# Patient Record
Sex: Female | Born: 1935
Health system: Southern US, Community
[De-identification: ages and names within clinical notes are randomized; demographics above are authoritative.]

## PROBLEM LIST (undated history)

## (undated) DIAGNOSIS — N816 Rectocele: Secondary | ICD-10-CM

## (undated) DIAGNOSIS — K219 Gastro-esophageal reflux disease without esophagitis: Secondary | ICD-10-CM

## (undated) DIAGNOSIS — Z9289 Personal history of other medical treatment: Secondary | ICD-10-CM

## (undated) DIAGNOSIS — M1990 Unspecified osteoarthritis, unspecified site: Secondary | ICD-10-CM

## (undated) DIAGNOSIS — Z8669 Personal history of other diseases of the nervous system and sense organs: Secondary | ICD-10-CM

## (undated) DIAGNOSIS — M2550 Pain in unspecified joint: Secondary | ICD-10-CM

## (undated) DIAGNOSIS — D509 Iron deficiency anemia, unspecified: Secondary | ICD-10-CM

## (undated) DIAGNOSIS — Z9889 Other specified postprocedural states: Secondary | ICD-10-CM

## (undated) DIAGNOSIS — R011 Cardiac murmur, unspecified: Secondary | ICD-10-CM

## (undated) DIAGNOSIS — R079 Chest pain, unspecified: Secondary | ICD-10-CM

## (undated) DIAGNOSIS — R112 Nausea with vomiting, unspecified: Secondary | ICD-10-CM

## (undated) HISTORY — PX: TONSILLECTOMY: SUR1361

## (undated) HISTORY — DX: Chest pain, unspecified: R07.9

## (undated) HISTORY — PX: JOINT REPLACEMENT: SHX530

## (undated) HISTORY — PX: SHOULDER ARTHROSCOPY: SHX128

## (undated) HISTORY — DX: Personal history of other diseases of the nervous system and sense organs: Z86.69

## (undated) HISTORY — DX: Rectocele: N81.6

## (undated) HISTORY — PX: THYROIDECTOMY: SHX17

## (undated) HISTORY — PX: OTHER SURGICAL HISTORY: SHX169

## (undated) HISTORY — PX: COLONOSCOPY: SHX174

## (undated) HISTORY — DX: Iron deficiency anemia, unspecified: D50.9

---

## 1998-04-13 ENCOUNTER — Other Ambulatory Visit: Admission: RE | Admit: 1998-04-13 | Discharge: 1998-04-13 | Payer: Self-pay | Admitting: Obstetrics and Gynecology

## 1999-04-17 ENCOUNTER — Other Ambulatory Visit: Admission: RE | Admit: 1999-04-17 | Discharge: 1999-04-17 | Payer: Self-pay | Admitting: Obstetrics and Gynecology

## 1999-08-29 ENCOUNTER — Encounter: Payer: Self-pay | Admitting: Obstetrics and Gynecology

## 1999-08-29 ENCOUNTER — Encounter: Admission: RE | Admit: 1999-08-29 | Discharge: 1999-08-29 | Payer: Self-pay | Admitting: Obstetrics and Gynecology

## 1999-11-20 HISTORY — PX: HYSTEROSCOPY: SHX211

## 1999-11-23 ENCOUNTER — Encounter: Payer: Self-pay | Admitting: Obstetrics and Gynecology

## 1999-11-23 ENCOUNTER — Encounter (INDEPENDENT_AMBULATORY_CARE_PROVIDER_SITE_OTHER): Payer: Self-pay | Admitting: Specialist

## 1999-11-23 ENCOUNTER — Ambulatory Visit (HOSPITAL_COMMUNITY): Admission: RE | Admit: 1999-11-23 | Discharge: 1999-11-23 | Payer: Self-pay | Admitting: Obstetrics and Gynecology

## 2001-04-01 ENCOUNTER — Encounter: Payer: Self-pay | Admitting: Ophthalmology

## 2001-04-04 ENCOUNTER — Ambulatory Visit (HOSPITAL_COMMUNITY): Admission: RE | Admit: 2001-04-04 | Discharge: 2001-04-04 | Payer: Self-pay | Admitting: Ophthalmology

## 2001-05-15 ENCOUNTER — Other Ambulatory Visit: Admission: RE | Admit: 2001-05-15 | Discharge: 2001-05-15 | Payer: Self-pay | Admitting: Obstetrics and Gynecology

## 2001-05-20 ENCOUNTER — Ambulatory Visit (HOSPITAL_COMMUNITY): Admission: RE | Admit: 2001-05-20 | Discharge: 2001-05-20 | Payer: Self-pay | Admitting: Ophthalmology

## 2001-05-25 ENCOUNTER — Emergency Department (HOSPITAL_COMMUNITY): Admission: EM | Admit: 2001-05-25 | Discharge: 2001-05-25 | Payer: Self-pay | Admitting: Emergency Medicine

## 2002-03-23 ENCOUNTER — Encounter: Payer: Self-pay | Admitting: Geriatric Medicine

## 2002-03-23 ENCOUNTER — Encounter: Admission: RE | Admit: 2002-03-23 | Discharge: 2002-03-23 | Payer: Self-pay | Admitting: Geriatric Medicine

## 2002-11-05 ENCOUNTER — Encounter: Payer: Self-pay | Admitting: Orthopedic Surgery

## 2002-11-09 ENCOUNTER — Inpatient Hospital Stay (HOSPITAL_COMMUNITY): Admission: RE | Admit: 2002-11-09 | Discharge: 2002-11-13 | Payer: Self-pay | Admitting: Orthopedic Surgery

## 2003-01-29 ENCOUNTER — Encounter: Admission: RE | Admit: 2003-01-29 | Discharge: 2003-01-29 | Payer: Self-pay | Admitting: Geriatric Medicine

## 2003-06-08 ENCOUNTER — Other Ambulatory Visit: Admission: RE | Admit: 2003-06-08 | Discharge: 2003-06-08 | Payer: Self-pay | Admitting: Obstetrics and Gynecology

## 2003-08-17 ENCOUNTER — Ambulatory Visit (HOSPITAL_COMMUNITY): Admission: RE | Admit: 2003-08-17 | Discharge: 2003-08-17 | Payer: Self-pay | Admitting: Internal Medicine

## 2003-09-27 ENCOUNTER — Inpatient Hospital Stay (HOSPITAL_COMMUNITY): Admission: RE | Admit: 2003-09-27 | Discharge: 2003-10-01 | Payer: Self-pay | Admitting: Orthopedic Surgery

## 2004-08-15 ENCOUNTER — Encounter: Admission: RE | Admit: 2004-08-15 | Discharge: 2004-08-15 | Payer: Self-pay | Admitting: Geriatric Medicine

## 2005-06-14 ENCOUNTER — Encounter: Admission: RE | Admit: 2005-06-14 | Discharge: 2005-06-14 | Payer: Self-pay | Admitting: Geriatric Medicine

## 2007-05-05 ENCOUNTER — Ambulatory Visit: Payer: Self-pay | Admitting: Internal Medicine

## 2007-05-05 LAB — CONVERTED CEMR LAB
Basophils Absolute: 0.1 10*3/uL (ref 0.0–0.1)
Eosinophils Absolute: 0.1 10*3/uL (ref 0.0–0.6)
Eosinophils Relative: 2.5 % (ref 0.0–5.0)
MCV: 94.1 fL (ref 78.0–100.0)
Monocytes Absolute: 0.7 10*3/uL (ref 0.2–0.7)
RBC: 3.47 M/uL — ABNORMAL LOW (ref 3.87–5.11)
Saturation Ratios: 30.7 % (ref 20.0–50.0)
Vitamin B-12: 782 pg/mL (ref 211–911)
WBC: 5.9 10*3/uL (ref 4.5–10.5)

## 2007-05-21 ENCOUNTER — Encounter: Payer: Self-pay | Admitting: Internal Medicine

## 2007-05-21 ENCOUNTER — Ambulatory Visit: Payer: Self-pay | Admitting: Internal Medicine

## 2007-05-21 ENCOUNTER — Ambulatory Visit (HOSPITAL_COMMUNITY): Admission: RE | Admit: 2007-05-21 | Discharge: 2007-05-21 | Payer: Self-pay | Admitting: Internal Medicine

## 2007-05-29 ENCOUNTER — Ambulatory Visit: Payer: Self-pay | Admitting: Internal Medicine

## 2007-06-09 ENCOUNTER — Ambulatory Visit: Payer: Self-pay | Admitting: Internal Medicine

## 2007-06-09 LAB — CONVERTED CEMR LAB
OCCULT 1: NEGATIVE
OCCULT 1: NEGATIVE
OCCULT 2: NEGATIVE
OCCULT 4: NEGATIVE
OCCULT 5: NEGATIVE

## 2007-07-09 ENCOUNTER — Ambulatory Visit: Payer: Self-pay | Admitting: Internal Medicine

## 2007-07-10 ENCOUNTER — Encounter: Payer: Self-pay | Admitting: Internal Medicine

## 2007-07-10 LAB — CONVERTED CEMR LAB
Basophils Absolute: 0.1 10*3/uL (ref 0.0–0.1)
Basophils Relative: 1 % (ref 0.0–1.0)
Eosinophils Absolute: 0.1 10*3/uL (ref 0.0–0.7)
HCT: 31.7 % — ABNORMAL LOW (ref 36.0–46.0)
Hemoglobin: 10.5 g/dL — ABNORMAL LOW (ref 12.0–15.0)
MCHC: 33.1 g/dL (ref 30.0–36.0)
MCV: 94 fL (ref 78.0–100.0)
Monocytes Relative: 10.5 % (ref 3.0–12.0)
Neutro Abs: 3.7 10*3/uL (ref 1.4–7.7)
RDW: 14.1 % (ref 11.5–14.6)

## 2007-07-15 ENCOUNTER — Telehealth: Payer: Self-pay | Admitting: Internal Medicine

## 2007-07-16 ENCOUNTER — Telehealth: Payer: Self-pay | Admitting: Internal Medicine

## 2007-09-03 ENCOUNTER — Ambulatory Visit: Payer: Self-pay | Admitting: Internal Medicine

## 2007-09-03 LAB — CONVERTED CEMR LAB
Basophils Absolute: 0.1 10*3/uL (ref 0.0–0.1)
Basophils Relative: 1 % (ref 0.0–3.0)
Eosinophils Absolute: 0.1 10*3/uL (ref 0.0–0.7)
MCHC: 34.1 g/dL (ref 30.0–36.0)
MCV: 94.8 fL (ref 78.0–100.0)
Monocytes Absolute: 0.6 10*3/uL (ref 0.1–1.0)
Monocytes Relative: 8.8 % (ref 3.0–12.0)
Neutro Abs: 4.4 10*3/uL (ref 1.4–7.7)
Neutrophils Relative %: 62 % (ref 43.0–77.0)
Platelets: 327 10*3/uL (ref 150–400)

## 2007-11-01 ENCOUNTER — Emergency Department (HOSPITAL_COMMUNITY): Admission: EM | Admit: 2007-11-01 | Discharge: 2007-11-01 | Payer: Self-pay | Admitting: Emergency Medicine

## 2007-11-03 ENCOUNTER — Ambulatory Visit: Payer: Self-pay | Admitting: Internal Medicine

## 2007-11-04 LAB — CONVERTED CEMR LAB
Basophils Absolute: 0.1 10*3/uL (ref 0.0–0.1)
HCT: 33.2 % — ABNORMAL LOW (ref 36.0–46.0)
Hemoglobin: 11.5 g/dL — ABNORMAL LOW (ref 12.0–15.0)
Lymphocytes Relative: 28 % (ref 12.0–46.0)
MCV: 93.4 fL (ref 78.0–100.0)
Monocytes Absolute: 0.6 10*3/uL (ref 0.1–1.0)
Neutrophils Relative %: 58.9 % (ref 43.0–77.0)

## 2007-11-27 ENCOUNTER — Encounter: Admission: RE | Admit: 2007-11-27 | Discharge: 2007-11-27 | Payer: Self-pay | Admitting: Orthopedic Surgery

## 2007-12-25 ENCOUNTER — Telehealth: Payer: Self-pay | Admitting: Internal Medicine

## 2007-12-25 ENCOUNTER — Ambulatory Visit: Payer: Self-pay | Admitting: Internal Medicine

## 2007-12-29 LAB — CONVERTED CEMR LAB
Basophils Absolute: 0.1 10*3/uL (ref 0.0–0.1)
Basophils Relative: 1.3 % (ref 0.0–3.0)
Eosinophils Absolute: 0 10*3/uL (ref 0.0–0.7)
Eosinophils Relative: 0.9 % (ref 0.0–5.0)
HCT: 31.6 % — ABNORMAL LOW (ref 36.0–46.0)
Hemoglobin: 11 g/dL — ABNORMAL LOW (ref 12.0–15.0)
Iron: 74 ug/dL (ref 42–145)
Lymphocytes Relative: 27.5 % (ref 12.0–46.0)
MCHC: 34.8 g/dL (ref 30.0–36.0)
MCV: 93.4 fL (ref 78.0–100.0)
Monocytes Absolute: 0.5 10*3/uL (ref 0.1–1.0)
Monocytes Relative: 9 % (ref 3.0–12.0)
Neutro Abs: 3.2 10*3/uL (ref 1.4–7.7)
Neutrophils Relative %: 61.3 % (ref 43.0–77.0)
Platelets: 315 10*3/uL (ref 150–400)
RBC: 3.38 M/uL — ABNORMAL LOW (ref 3.87–5.11)
RDW: 13.6 % (ref 11.5–14.6)
Saturation Ratios: 30.1 % (ref 20.0–50.0)
Transferrin: 175.7 mg/dL — ABNORMAL LOW (ref >=212.0)
WBC: 5.3 10*3/uL (ref 4.5–10.5)

## 2008-03-02 ENCOUNTER — Ambulatory Visit: Payer: Self-pay | Admitting: Internal Medicine

## 2008-03-03 LAB — CONVERTED CEMR LAB
Eosinophils Relative: 1.3 % (ref 0.0–5.0)
HCT: 32.5 % — ABNORMAL LOW (ref 36.0–46.0)
Hemoglobin: 10.8 g/dL — ABNORMAL LOW (ref 12.0–15.0)
MCHC: 33.4 g/dL (ref 30.0–36.0)
MCV: 96.2 fL (ref 78.0–100.0)
Monocytes Relative: 12 % (ref 3.0–12.0)
Neutrophils Relative %: 61.9 % (ref 43.0–77.0)
RBC: 3.37 M/uL — ABNORMAL LOW (ref 3.87–5.11)
RDW: 13.5 % (ref 11.5–14.6)
WBC: 6.3 10*3/uL (ref 4.5–10.5)

## 2008-04-07 ENCOUNTER — Ambulatory Visit: Payer: Self-pay | Admitting: Internal Medicine

## 2008-04-09 ENCOUNTER — Telehealth: Payer: Self-pay | Admitting: Internal Medicine

## 2008-04-09 LAB — CONVERTED CEMR LAB
Basophils Absolute: 0.1 10*3/uL (ref 0.0–0.1)
Ferritin: 192.7 ng/mL (ref 10.0–291.0)
HCT: 34.7 % — ABNORMAL LOW (ref 36.0–46.0)
Lymphocytes Relative: 30.4 % (ref 12.0–46.0)
MCV: 94.7 fL (ref 78.0–100.0)
Neutro Abs: 3.7 10*3/uL (ref 1.4–7.7)
Neutrophils Relative %: 57.7 % (ref 43.0–77.0)
Platelets: 296 10*3/uL (ref 150–400)
RDW: 13.8 % (ref 11.5–14.6)
Saturation Ratios: 22.6 % (ref 20.0–50.0)
Transferrin: 195.7 mg/dL — ABNORMAL LOW (ref >=212.0)
WBC: 6.5 10*3/uL (ref 4.5–10.5)

## 2008-06-22 ENCOUNTER — Ambulatory Visit: Payer: Self-pay | Admitting: Internal Medicine

## 2008-06-23 LAB — CONVERTED CEMR LAB
Basophils Relative: 0.7 % (ref 0.0–3.0)
HCT: 33.3 % — ABNORMAL LOW (ref 36.0–46.0)
Hemoglobin: 11.6 g/dL — ABNORMAL LOW (ref 12.0–15.0)
Lymphocytes Relative: 24.9 % (ref 12.0–46.0)
MCHC: 34.7 g/dL (ref 30.0–36.0)
Monocytes Relative: 10.4 % (ref 3.0–12.0)
Neutrophils Relative %: 62.8 % (ref 43.0–77.0)
Platelets: 295 10*3/uL (ref 150.0–400.0)
RBC: 3.55 M/uL — ABNORMAL LOW (ref 3.87–5.11)
RDW: 13.8 % (ref 11.5–14.6)

## 2008-08-26 ENCOUNTER — Ambulatory Visit: Payer: Self-pay | Admitting: Internal Medicine

## 2008-09-01 ENCOUNTER — Ambulatory Visit: Payer: Self-pay | Admitting: Oncology

## 2008-09-01 LAB — CONVERTED CEMR LAB
Basophils Relative: 0.1 % (ref 0.0–3.0)
Monocytes Relative: 8 % (ref 3.0–12.0)
Neutro Abs: 3.9 10*3/uL (ref 1.4–7.7)
Neutrophils Relative %: 64.9 % (ref 43.0–77.0)
Platelets: 282 10*3/uL (ref 150.0–400.0)
RBC: 3.39 M/uL — ABNORMAL LOW (ref 3.87–5.11)
RDW: 13.8 % (ref 11.5–14.6)
Saturation Ratios: 27.3 % (ref 20.0–50.0)
WBC: 6 10*3/uL (ref 4.5–10.5)

## 2008-09-03 ENCOUNTER — Encounter: Payer: Self-pay | Admitting: Internal Medicine

## 2008-09-03 LAB — CBC WITH DIFFERENTIAL (CANCER CENTER ONLY)
BASO#: 0 10*3/uL (ref 0.0–0.2)
BASO%: 0.5 % (ref 0.0–2.0)
Eosinophils Absolute: 0.1 10*3/uL (ref 0.0–0.5)
LYMPH#: 1.1 10*3/uL (ref 0.9–3.3)
MCH: 31.1 pg (ref 26.0–34.0)
MCHC: 35.2 g/dL (ref 32.0–36.0)
MCV: 89 fL (ref 81–101)
RBC: 3.59 10*6/uL — ABNORMAL LOW (ref 3.70–5.32)

## 2008-09-03 LAB — MORPHOLOGY - CHCC SATELLITE

## 2008-09-03 LAB — CMP (CANCER CENTER ONLY)
ALT(SGPT): 27 U/L (ref 10–47)
Albumin: 3.2 g/dL — ABNORMAL LOW (ref 3.3–5.5)
Alkaline Phosphatase: 71 U/L (ref 26–84)
BUN, Bld: 13 mg/dL (ref 7–22)
Calcium: 8.8 mg/dL (ref 8.0–10.3)
Glucose, Bld: 104 mg/dL (ref 73–118)
Total Protein: 7.3 g/dL (ref 6.4–8.1)

## 2008-09-07 ENCOUNTER — Encounter: Payer: Self-pay | Admitting: Internal Medicine

## 2008-09-07 LAB — PROTEIN ELECTROPHORESIS, SERUM
Albumin ELP: 57.5 % (ref 55.8–66.1)
Alpha-1-Globulin: 4.8 % (ref 2.9–4.9)
Beta 2: 5.1 % (ref 3.2–6.5)
Total Protein, Serum Electrophoresis: 7.1 g/dL (ref 6.0–8.3)

## 2008-09-07 LAB — CBC WITH DIFFERENTIAL (CANCER CENTER ONLY)
BASO#: 0.1 10*3/uL (ref 0.0–0.2)
Eosinophils Absolute: 0.2 10*3/uL (ref 0.0–0.5)
HGB: 11.4 g/dL — ABNORMAL LOW (ref 11.6–15.9)
LYMPH#: 1.8 10*3/uL (ref 0.9–3.3)
MCH: 31.4 pg (ref 26.0–34.0)
MONO#: 0.6 10*3/uL (ref 0.1–0.9)
MONO%: 9 % (ref 0.0–13.0)
NEUT#: 4.2 10*3/uL (ref 1.5–6.5)
Platelets: 318 10*3/uL (ref 145–400)
RBC: 3.62 10*6/uL — ABNORMAL LOW (ref 3.70–5.32)
WBC: 6.8 10*3/uL (ref 3.9–10.0)

## 2008-09-07 LAB — IRON AND TIBC
Iron: 86 ug/dL (ref 42–145)
UIBC: 172 ug/dL

## 2008-09-07 LAB — FERRITIN: Ferritin: 322 ng/mL — ABNORMAL HIGH (ref 10–291)

## 2008-12-06 ENCOUNTER — Encounter: Admission: RE | Admit: 2008-12-06 | Discharge: 2008-12-06 | Payer: Self-pay | Admitting: Internal Medicine

## 2008-12-29 ENCOUNTER — Ambulatory Visit: Payer: Self-pay | Admitting: Oncology

## 2009-01-03 ENCOUNTER — Encounter: Payer: Self-pay | Admitting: Internal Medicine

## 2009-01-03 LAB — CBC WITH DIFFERENTIAL (CANCER CENTER ONLY)
EOS%: 2.1 % (ref 0.0–7.0)
HGB: 10.7 g/dL — ABNORMAL LOW (ref 11.6–15.9)
MCH: 31 pg (ref 26.0–34.0)
MCHC: 33 g/dL (ref 32.0–36.0)
MONO%: 8.4 % (ref 0.0–13.0)
NEUT#: 2.9 10*3/uL (ref 1.5–6.5)
Platelets: 321 10*3/uL (ref 145–400)
RDW: 12.8 % (ref 10.5–14.6)

## 2009-01-03 LAB — IRON AND TIBC: Iron: 115 ug/dL (ref 42–145)

## 2009-01-03 LAB — FERRITIN: Ferritin: 323 ng/mL — ABNORMAL HIGH (ref 10–291)

## 2009-02-19 DIAGNOSIS — N816 Rectocele: Secondary | ICD-10-CM

## 2009-02-19 HISTORY — DX: Rectocele: N81.6

## 2009-03-31 ENCOUNTER — Encounter: Payer: Self-pay | Admitting: Nurse Practitioner

## 2009-04-01 ENCOUNTER — Telehealth: Payer: Self-pay | Admitting: Internal Medicine

## 2009-04-04 ENCOUNTER — Telehealth: Payer: Self-pay | Admitting: Internal Medicine

## 2009-04-05 ENCOUNTER — Ambulatory Visit: Payer: Self-pay | Admitting: Gastroenterology

## 2009-04-05 DIAGNOSIS — M129 Arthropathy, unspecified: Secondary | ICD-10-CM | POA: Insufficient documentation

## 2009-04-05 DIAGNOSIS — K5900 Constipation, unspecified: Secondary | ICD-10-CM | POA: Insufficient documentation

## 2009-04-05 DIAGNOSIS — D509 Iron deficiency anemia, unspecified: Secondary | ICD-10-CM | POA: Insufficient documentation

## 2009-04-05 DIAGNOSIS — K648 Other hemorrhoids: Secondary | ICD-10-CM | POA: Insufficient documentation

## 2009-04-05 DIAGNOSIS — K219 Gastro-esophageal reflux disease without esophagitis: Secondary | ICD-10-CM | POA: Insufficient documentation

## 2009-04-27 ENCOUNTER — Ambulatory Visit: Payer: Self-pay | Admitting: Oncology

## 2009-05-03 ENCOUNTER — Encounter: Payer: Self-pay | Admitting: Internal Medicine

## 2009-05-03 LAB — CBC WITH DIFFERENTIAL (CANCER CENTER ONLY)
BASO%: 0.6 % (ref 0.0–2.0)
Eosinophils Absolute: 0.1 10*3/uL (ref 0.0–0.5)
HCT: 32.6 % — ABNORMAL LOW (ref 34.8–46.6)
HGB: 11.2 g/dL — ABNORMAL LOW (ref 11.6–15.9)
LYMPH%: 26.8 % (ref 14.0–48.0)
MCH: 31 pg (ref 26.0–34.0)
MCHC: 34.3 g/dL (ref 32.0–36.0)
MCV: 90 fL (ref 81–101)
Platelets: 322 10*3/uL (ref 145–400)
RDW: 13 % (ref 10.5–14.6)

## 2009-05-03 LAB — FERRITIN: Ferritin: 279 ng/mL (ref 10–291)

## 2009-05-03 LAB — IRON AND TIBC
%SAT: 29 % (ref 20–55)
Iron: 71 ug/dL (ref 42–145)
TIBC: 249 ug/dL — ABNORMAL LOW (ref 250–470)
UIBC: 178 ug/dL

## 2009-08-02 ENCOUNTER — Encounter: Admission: RE | Admit: 2009-08-02 | Discharge: 2009-08-02 | Payer: Self-pay | Admitting: Obstetrics and Gynecology

## 2010-03-12 ENCOUNTER — Encounter: Payer: Self-pay | Admitting: Obstetrics and Gynecology

## 2010-03-23 NOTE — Letter (Signed)
[facility]   Imported By: Lester Muncie 05/23/2009 09:46:58  _____________________________________________________________________  External Attachment:    Type:   Image     Comment:   External Document

## 2010-03-23 NOTE — Progress Notes (Signed)
Summary: TRIAGE   Phone Note Call from Patient Call back at Home Phone [phone]   Call For: Dr Juanda Chance Reason for Call: Talk to Nurse Summary of Call: Has an uncomfortable feeling and would like to see Dr today. Initial call taken by: Leanor Kail [facility],  April 01, 2009 8:12 AM  Follow-up for Phone Call        Pt. states that on Monday she had a very difficult time passing her BM. Since then she has had a very uncomfortable feeling of pressure in her rectum. Saw Dr.McComb yesterday, was told she may have pelvic support problems and gave her Estrogen cream to use, she didn't fill it yet. Denies pain, diarrhea, constipation, blood, black stools, fever, n/v. Pt. wants Dr.Gabriele Loveland to be aware of problem and to further advise.  1) Procede with recommendations per Dr.McComb 2) I will call pt., if new orders, after MD reviews.  Follow-up by: Laureen Ochs LPN,  April 01, 2009 8:48 AM  Additional Follow-up for Phone Call Additional follow up Details #1::        spoke with pt, will use Preparation H for rectal discomfort. I think she has a rectal irritation from straining. Additional Follow-up by: Hart Carwin MD,  April 01, 2009 4:02 PM

## 2010-03-23 NOTE — Letter (Signed)
Summary: [facility]   Imported By: Sherian Rein 04/12/2009 09:09:32  _____________________________________________________________________  External Attachment:    Type:   Image     Comment:   External Document

## 2010-03-23 NOTE — Progress Notes (Signed)
Summary: TRIAGE-Update  Medications Added ANALPRAM-HC 1-2.5 %  CREA (HYDROCORTISONE ACE-PRAMOXINE) Apply 3 times daily       Phone Note Call from Patient Call back at Home Phone [phone]   Caller: Patient Call For: Juanda Chance Reason for Call: Talk to Nurse Summary of Call: Patient wants to be seen today do to problems with her rectum. Initial call taken by: Tawni Levy,  April 04, 2009 8:25 AM  Follow-up for Phone Call        See triage on 04-01-09. "I have just got to see her, I have had a rough weekend"  She is having rectal discomfort and vaginal pressure. Had a BM last night x3, but had a very difficult time. She has been using the estrogen cream from Dr.McComb and the Prep-H Dr.Ataya Murdy advised, no relief.   DR.Sashay Felling PLEASE ADVISE  Follow-up by: Laureen Ochs LPN,  April 04, 2009 9:44 AM  Additional Follow-up for Phone Call Additional follow up Details #1::        I will not be in the office this week again. She should see a PA, needs anoscopy to assess for thrombosed hemorrhoid. Analpram 2.5% apply three times a day with an applicator. Additional Follow-up by: Hart Carwin MD,  April 04, 2009 1:11 PM    Additional Follow-up for Phone Call Additional follow up Details #2::    Above MD orders reviewed with patient. She will see Willette Cluster NP on 04-05-09 at 1:30pm. .CB  Follow-up by: Laureen Ochs LPN,  April 04, 2009 1:44 PM  New/Updated Medications: ANALPRAM-HC 1-2.5 %  CREA (HYDROCORTISONE ACE-PRAMOXINE) Apply 3 times daily Prescriptions: ANALPRAM-HC 1-2.5 %  CREA (HYDROCORTISONE ACE-PRAMOXINE) Apply 3 times daily  #1 x 1   Entered by:   Laureen Ochs LPN   Authorized by:   Hart Carwin MD   Signed by:   Laureen Ochs LPN on 82/95/6213   Method used:   Electronically to        [facility] (retail)       [address], [address]       [place], [place]  [address]       Ph: [phone]       Fax:  [phone]   RxID:   [id number]

## 2010-03-23 NOTE — Assessment & Plan Note (Signed)
Summary: F/U FROM TRIAGE 04-01-09 & 04-04-09     (DR.BRODIE PT)        D...    History of Present Illness Visit Type: Follow-up Visit Primary GI MD: Lina Sar MD Primary Provider: Merlene Laughter, MD  Requesting Provider: n/a Chief Complaint: Abd pressure, constipation,  and hemorrhoids History of Present Illness:   Patient followed by Dr. Juanda Chance for history of iron deficiency anemia.  She is here for recent development of constipation. Patient always has  normal BMs in the morning. A week ago however, she had urge to defecate but was unable to evacuate her bowels despite several minutes of straining. She did eventually defecate later that morning after exercising. A couple of days later patient developed pressure / discomfort  in the vaginal and rectal areas. Made appointment with her GYN at which time she was given Rx. for Estrace and a brochure on "Pelvic Support".  Patient called our office and was advised to try Prep H which she used for three nights without improvement. Yesterday we called in steroid suppositories and today she is somewhat better.No rectal bleeding. No dysuria.   GI Review of Systems      Denies abdominal pain, acid reflux, belching, bloating, chest pain, dysphagia with liquids, dysphagia with solids, heartburn, loss of appetite, nausea, vomiting, vomiting blood, weight loss, and  weight gain.      Reports constipation.     Denies anal fissure, black tarry stools, change in bowel habit, diarrhea, diverticulosis, fecal incontinence, heme positive stool, hemorrhoids, irritable bowel syndrome, jaundice, light color stool, liver problems, rectal bleeding, and  rectal pain.    Current Medications (verified): 1)  Analpram-Hc 1-2.5 %  Crea (Hydrocortisone Ace-Pramoxine) .... Apply 3 Times Daily 2)  Prilosec 20 Mg Cpdr (Omeprazole) .... Take One By Mouth Every Other Day 3)  Multivitamins  Tabs (Multiple Vitamin) .... Take One By Mouth Once Daily 4)  Estrace 0.1 Mg/gm Crea  (Estradiol) .... As Directed 5)  Calcium 1500 Mg Tabs (Calcium Carbonate) .... Once Daily 6)  Vision Formula  Tabs (Multiple Vitamins-Minerals) .... Once Daily  Allergies (verified): 1)  ! Sulfa  Past History:  Past Medical History: Reviewed history from 04/05/2009 and no changes required. Current Problems:  GERD (ICD-530.81) ARTHRITIS (ICD-716.90) HEMORRHOIDS, INTERNAL (ICD-455.0) ANEMIA, IRON DEFICIENCY (ICD-280.9)  Past Surgical History: Reviewed history from 04/05/2009 and no changes required. bilateral knee replacement Thyroid Surgery Tonsillectomy & adenoidectomy  Family History: Reviewed history from 04/05/2009 and no changes required. Family History of Heart Disease: mother, brotherX2 No FH of Colon Cancer:  Social History: Reviewed history from 04/05/2009 and no changes required. Patient has never smoked.  Alcohol Use - no Illicit Drug Use - no  Review of Systems  The patient denies allergy/sinus, anemia, anxiety-new, arthritis/joint pain, back pain, blood in urine, breast changes/lumps, change in vision, confusion, cough, coughing up blood, depression-new, fainting, fatigue, fever, headaches-new, hearing problems, heart murmur, heart rhythm changes, itching, menstrual pain, muscle pains/cramps, night sweats, nosebleeds, pregnancy symptoms, shortness of breath, skin rash, sleeping problems, sore throat, swelling of feet/legs, swollen lymph glands, thirst - excessive , urination - excessive , urination changes/pain, urine leakage, vision changes, and voice change.    Vital Signs:  Patient profile:   77 year old female Height:      63 inches Weight:      128 pounds BMI:     22.76 BSA:     1.60 Temp:     97.3 degrees F oral Pulse rate:  64. / minute Pulse rhythm:   regular BP sitting:   116 / 60  (left arm) Cuff size:   regular  Vitals Entered By: Ok Anis CMA (April 05, 2009 1:40 PM)  Physical Exam  General:  Well developed, well nourished, no  acute distress. Lungs:  Clear throughout to auscultation. Abdomen:  Abdomen soft, nontender, nondistended. No obvious masses or hepatomegaly.Normal bowel sounds.  Rectal:  A few mildly inflamed hemorrhoids. Small amount soft stool in vault. Good sphincter tone and pelvic floor descent. No rectal prolapse. Msk:  Symmetrical with no gross deformities. Normal posture. Extremities:  No palmar erythema, no edema.  Neurologic:  Alert and  oriented x4;  grossly normal neurologically. Skin:  Intact without significant lesions or rashes. Cervical Nodes:  No significant cervical adenopathy. Psych:  Alert and cooperative. Normal mood and affect.   Impression & Recommendations:  Problem # 1:  CONSTIPATION (ICD-564.00) Assessment New Associated vaginal / rectal discomfort. Saw GYN last week for vaginal discomfort and found to have prominent rectocele (I reviwed office note). She was given Rx for Estrace and literature on pelvic support. Her constipation is ikely related to anorectal outlet dysfunction secondary to rectocele. She does have hemorrhoids on exam which could be adding to her discomfort. She is somewhat better after starting steroid cream so i advised her to use it several more days. Avoid straining with defecation, use Dulcolax suppositories as needed. Continue Kegal exercises. She has follow up appt. with GYN in April. If unable to manage constipation, patient may need surgical evaluation.   Patient Instructions: 1)  Continue using the rectal cream that you already have at home. 2)  Use Dulcolax supp one per rectum as needed. 3)  Take Miralax as needed, samples given. 4)  Continue kegel exercises.  5)  Call our office back within a week with an update. 6)  Make a follow up appt with Dr. Juanda Chance and make sure to keep that appt.  7)  The medication list was reviewed and reconciled.  All changed / newly prescribed medications were explained.  A complete medication list was provided to the  patient / caregiver.

## 2010-07-04 NOTE — Assessment & Plan Note (Signed)
[facility]                         GASTROENTEROLOGY OFFICE NOTE   Melissa [name], Melissa [name]                       MRN:          [id number]  DATE:05/05/2007                            DOB:          17-Mar-1928    REFERRING PHYSICIAN:  Hal T. Stoneking, M.D.   Melissa [name] is a 77 year old white female referred by Dr. Pete Glatter for  evaluation of severe iron deficiency. Hemoglobin about four weeks ago  was 11.6 with iron saturation of 2%. She had a complete physical exam in  January 2009. She denies any visible bright red blood per rectum or  melena. We have seen Melissa [name] in the past for colonoscopy which was  done in June 2005 and showed an entirely normal exam with no evidence of  polyps. Only small internal hemorrhoids were noted. She also has a  history of gastroesophageal reflux for which she takes Prilosec 20 mg  daily. She has been on Fosamax for many years and on generic Fosamax  about one year. Melissa [name] has had epigastric discomfort which occurs  quite frequently and may extend along the left costal margin but does  not radiate to the back. It occurs at night or during the day. She  denies any dysphagia or odynophagia, but her symptoms have improved when  she takes Prilosec. The patient has been on some ibuprofen one or two  tablets at a time once or twice a day for degenerative joint disease.   MEDICATIONS:  1. Fosamax 70 mg weekly.  2. Prilosec 20 mg p.o. every day.  3. Vision Formula with zinc.  4. Multivitamins.  5. Calcium.  6. Iron supplements.   PAST HISTORY:  1. Knee replacements bilaterally.  2. Thyroidectomy.  3. In childhood tonsillectomy and adenoidectomy.   FAMILY HISTORY:  Positive for diabetes. Negative for colon cancer. Heart  disease in brothers and mother.   SOCIAL HISTORY:  Married with two children. She is a Futures trader. She does  not smoke, does not drink alcohol.   REVIEW OF SYSTEMS:  Essentially negative except for  arthritic  complaints.   PHYSICAL EXAMINATION:  VITAL SIGNS:  Blood pressure 110/88, pulse 60,  and weight 133 pounds.  GENERAL:  She was alert, oriented, in no distress. Appeared healthy.  HEENT:  Sclerae nonicteric. No __________ erythema.  LUNGS:  Clear to auscultation.  COR: Normal S1, normal S2.  ABDOMEN:  Soft with minimal tenderness in the epigastric area in the  midline. Liver edge was at the costal margin. Lower abdomen was normal.  RECTAL:  Today shows small amount of Hemoccult negative stool in the  ampulla.   IMPRESSION:  A 77 year old white female with severe iron deficiency, 2%  iron saturation and mild normocytic anemia. Hemoglobin of 11.6,  hematocrit 33.4. Her stool is Hemoccult negative today, but she must  have been losing blood intermittently most likely from an upper  gastrointestinal plaque which  may explain her epigastric pain.  Ibuprofen as well as Fosamax both can cause occult gastrointestinal  blood loss. She may have a hiatal hernia or possibly gastric ulcer. We  need to rule  out gastric neoplasm. The patient had recent colonoscopy  four years ago, but because of the change in bowel habits and some  diarrhea and loose stools, we will also proceed with colonoscopic exam.   PLAN:  1. Upper endoscopy and colonoscopy.  2. Discontinue iron five days prior to the colonoscopic exam.  3. Continue Prilosec 20 mg daily.  4. Today we are going to recheck her CBC as well as iron studies and      B12 since it has been four weeks since the last check. I have      discouraged the patient from taking ibuprofen.     Hedwig Morton. Juanda Chance, MD  Electronically Signed    DMB/MedQ  DD: 05/05/2007  DT: 05/05/2007  Job #: [id number]   cc:   Hal T. Stoneking, M.D.

## 2010-07-07 NOTE — H&P (Signed)
[place]  Patient:    Melissa [name], Melissa [name] Visit Number: [id number] MRN: [id number]          Service Type: EMS Location: [facility] Attending Physician:  Doug Sou Dictated by:   Guadelupe Sabin, M.D. Admit Date:  05/25/2001 Discharge Date: 05/25/2001   CC:         Hal T. Stoneking, M.D.   History and Physical  REASON FOR ADMISSION:  This was a planned outpatient surgical admission of this 77 year old white female admitted for cataract/implant surgery of the left eye.  PRESENT ILLNESS:  This patient has been noted to have progressive cataract formations in both eyes.  She was previously admitted on April 04, 2001 for cataract/implant surgery of the right eye.  The patient did well following this surgery with improvement of visual acuity to 20/40.  Meanwhile, the unoperated eye had drifted downward to 20/300 and the patient was symptomatic, complaining of smoky or dim vision, difficulty with driving, television, reading, night vision, bright sunlight to vision, colors appearing dim and dull.  She therefore elected to proceed with similar surgery of the left eye. She signed an informed consent and arrangements were made for her outpatient readmission.  PAST MEDICAL HISTORY:  Stable general health, under the care of Dr. Ann Maki T. Stoneking.  Dr. Pete Glatter notes the patients good general health.  CURRENT MEDICATIONS:  Fosamax, MVI and calcium.  ALLERGIES:  She is said to be allergic to SULFA and is felt to be at low risk for the surgical procedure.  REVIEW OF SYSTEMS:  No cardiorespiratory complaints.  PHYSICAL EXAMINATION:  VITAL SIGNS:  As recorded on admission, blood pressure 96/59, heart rate 67, respirations 14, temperature 96.5.  GENERAL APPEARANCE:  The patient is a pleasant, well-nourished, well-developed white 77 year old female in no acute distress.  HEENT:  Eyes:  Visual acuity as noted above.  External ocular and  slitlamp examination:  The eyes are white and clear with a posterior chamber intraocular lens implant of the right eye and a dense nuclear cataract of the left eye.  CHEST:  Lungs clear to percussion and auscultation.  HEART:  Normal sinus rhythm.  No cardiomegaly.  No murmurs.  ABDOMEN:  Negative.  EXTREMITIES:  Negative.  ADMISSION DIAGNOSES: 1. Senile cataract, dense nuclear type, left eye. 2. Pseudophakia, right eye.  SURGICAL PLAN:  Cataract/implant surgery, left eye. Dictated by:   Guadelupe Sabin, M.D. Attending Physician:  Doug Sou DD:  06/12/01 TD:  06/13/01 Job: [id number]

## 2010-07-07 NOTE — Op Note (Signed)
NAME:  Melissa [name], WIVELL                          ACCOUNT NO.:  [id number]   MEDICAL RECORD NO.:  [id number]                   PATIENT TYPE:  INP   LOCATION:  0467                                 FACILITY:  [facility]   PHYSICIAN:  Ollen Gross, M.D.                 DATE OF BIRTH:  Nov 04, 1928   DATE OF PROCEDURE:  09/27/2003  DATE OF DISCHARGE:                                 OPERATIVE REPORT   PREOPERATIVE DIAGNOSIS:  Osteoarthritis, left knee.   POSTOPERATIVE DIAGNOSIS:  Osteoarthritis, left knee.   PROCEDURE:  Left total knee arthroplasty.   SURGEON:  Gus Rankin. Aluisio, M.D.   ASSISTANT:  Avel Peace, PA-C   ANESTHESIA:  Spinal.   ESTIMATED BLOOD LOSS:  Minimal.   DRAIN:  Hemovac x 1.   TOURNIQUET TIME:  39 minutes at 300 mmHg.   COMPLICATIONS:  None.   CONDITION:  Stable to recovery.   BRIEF CLINICAL NOTE:  Ms. Melissa [name] is a 77 year old female, who has severe end-  stage arthritis of the left knee with intractable pain.  Has had a previous  successful right total knee arthroplasty and presents now for left total  knee arthroplasty.   PROCEDURE IN DETAIL:  After the successful administration of spinal  anesthetic, a tourniquet is placed high on the left thigh and left lower  extremity prepped and draped in the usual sterile fashion.  Extremity is  wrapped in Esmarch, knee flexed, tourniquet inflated to 300 mmHg.  Standard  midline incision is made with a 10 blade through subcutaneous tissue to the  level of the extensor mechanism.  A fresh blade is used to make a medial  parapatellar arthrotomy, then the soft tissue over the proximal and medial  tibia is subperiosteally elevated to the joint line with a knife and into  the semimembranous bursa with a curved osteotome.  Soft tissue over the  proximal and lateral tibia is also elevated with attention being paid to  avoiding the patellar tendon on tibial tubercle.  Patella is everted, knee  flexed 90 degrees, ACL and PCL are  removed.  Drill is used to create a  starting hole in the distal femur, and canal is irrigated.  A 5-degree left  valgus alignment guide is placed, then referencing off the posterior  condyles, rotation is marked and a block pinned to remove 10 mm off the  distal femur.  Distal femoral resection is made with an oscillating saw.  The sizing block is placed, and size 3 is most appropriate.  Rotation was  marked at the epicondylar axis.  Size 3 cutting block is placed and then the  anterior and posterior cuts made.   Tibia is subluxed forward, and the menisci are removed.  Extramedullary  tibial alignment guide is placed referencing proximally at the medial aspect  of the tibial tubercle and distally along the second metatarsal axis and  tibial crest.  The  block is pinned to remove 10 mm off the nondeficient  lateral side.  Tibial resection is made with an oscillating saw.  Size 3 is  the most appropriate tibial component.  Then the proximal tibia is then  prepared with the modular drill and keel punch for a size 3.  Femoral  preparation is completed with the intercondylar and chamfer cuts.   The trial size 3 posterior femoral trial and size 3 mobile bearing trial and  a 10 mm posterior stabilized rotating platform trials are placed.  With the  10, full extension is achieved with excellent varus and valgus balance  throughout full range of motion.  The patella is then everted, thickness  measured to be 23 mm, free hand resection taken to 13 mm, 38 template  placed, lug holes drilled, trial patella placed, and it tracks normally.  The trials are then removed except for the femoral trial which is left in  place to remove osteophytes are then removed off the posterior femur with  the femoral trial in place.  The cut bone surfaces are then prepared with  pulsatile lavage.  Cement is mixed and once ready for implantation, the size  3 posterior stabilized femur, size 3 mobile bearing tibial tray,  and 38  patella are cemented into place.  Patella is held with a clamp.  Trial 10 mm  posterior stabilized rotating platform insert is then placed into the tibial  tray, knee held in full extension, all extruded cement removed.  Once the  cement is fully hardened, then the  permanent 10 mm posterior stabilized  rotating platform insert is placed.  The wound is copiously irrigated with  antibiotic solution and the extensor mechanism closed over a Hemovac drain  with interrupted #1 PDS.  Flexion against gravity is 140 degrees.  Tourniquet is released for a total time of 39 minutes.  Subcu is closed with  interrupted 2-0 Vicryl, subcuticular running 4-0 Monocryl.  Catheter for the  Marcaine pain pump is placed.  This is to be started once the spinal wears  off.  The drain is hooked to suction; a bulky sterile dressing is applied,  and then she is placed into a knee immobilizer, awakened, and transported to  recovery in stable condition.                                               Ollen Gross, M.D.    FA/MEDQ  D:  09/27/2003  T:  09/27/2003  Job:  [id number]

## 2010-07-07 NOTE — Op Note (Signed)
[place]. [facility]  Patient:    TALAR, FRALEY Visit Number: [id number] MRN: [id number]          Service Type: EMS Location: [facility] Attending Physician:  Doug Sou Dictated by:   Guadelupe Sabin, M.D. Proc. Date: 05/20/01 Admit Date:  05/25/2001 Discharge Date: 05/25/2001   CC:         Hal T. Stoneking, M.D.   Operative Report  PREOPERATIVE DIAGNOSIS:  Senile nuclear cataract, left eye.  POSTOPERATIVE DIAGNOSIS:  Senile nuclear cataract, left eye.  OPERATION:  Planned extracapsular cataract extraction -- phacoemulsification, primary insertion of posterior chamber intraocular lens implant.  SURGEON:  Guadelupe Sabin, M.D.  ASSISTANT:  Nurse.  ANESTHESIA:  Local 4% Xylocaine, 0.75% Marcaine retrobulbar block, topical tetracycline, intraocular Xylocaine, anesthesia standby required in this elderly patient; patient was given sodium pentothal or Ditropan intravenously during the period of retrobulbar blocking.  DESCRIPTION OF PROCEDURE:  After the patient was prepped and draped, a lid speculum was inserted in the left eye.  The eye was turned downward and a superior rectus traction suture placed.  Schiotz tonometry was recorded and was felt to be within a safe normal scale reading zone.  A superior rectus traction suture was placed.  A peritomy was performed adjacent to the limbus from the 11 to 1 oclock position.  The corneoscleral junction was cleaned and a corneoscleral groove made with a 45 degree Superblade.  The anterior chamber was then entered with the 2.5-mm diamond keratome at the 12 oclock position and the 15 degree blade at the 2:30 position.  Using a bent 26-gauge needle on a Healon syringe, a circular capsulorrhexis was begun and then completed with the Grabow forceps.  Hydrodissection and hydrodelineation were performed using 1% Xylocaine.  A 30 degree phacoemulsification tip was then inserted with slow controlled  emulsification of the lens nucleus, total ultrasonic time -- 1 minute 3 seconds, average power level -- 15%, total amount of fluid used -- 75 cc.  Following removal of the nucleus, the residual cortex was aspirated with the irrigation-aspiration tip.  The posterior capsule appeared intact with a brilliant red fundus reflex; it was therefore elected to insert an [employer] SI40NB silicone three-piece posterior chamber intraocular lens implant with UV absorber, diopter strength +19.00.  This was inserted with a McDonald forceps folded into the anterior chamber and then opened and centered into the capsular bag using the [facility] lens rotator; the lens appeared to be well-centered.  The Healon which had been used during the procedure was aspirated and replaced with balanced salt solution and Miochol ophthalmic solution.  The operative incisions appeared to be self-sealing and no sutures were required.  Maxitrol ointment was instilled in the conjunctival cul-de-sac and a light patch and protective shield applied.  Duration of procedure and anesthesia administration -- 45 minutes.  Patient tolerated the procedure well in general and left the operating room for the recovery room in good condition.Dictated by:   Guadelupe Sabin, M.D. Attending Physician:  Doug Sou DD:  06/12/01 TD:  06/13/01 Job: [id number]

## 2010-07-07 NOTE — Op Note (Signed)
[place]. [facility]  Patient:    Melissa [name], Melissa [name] Visit Number: [id number] MRN: [id number]          Service Type: DSU Location: [facility] 2899 16 Attending Physician:  Ivor Messier Dictated by:   Guadelupe Sabin, M.D. Proc. Date: 04/04/01 Admit Date:  04/04/2001                             Operative Report  PREOPERATIVE DIAGNOSIS:  Senile nuclear cataract, right eye.  POSTOPERATIVE DIAGNOSIS:  Senile nuclear cataract, right eye.  OPERATION:  Planned extracapsular cataract extraction -- phacoemulsification, primary insertion of posterior chamber intraocular lens implant.  SURGEON:  Guadelupe Sabin, M.D.  ASSISTANT:  Nurse.  ANESTHESIA:  Local 4% Xylocaine, 0.75% Marcaine retrobulbar block, topical tetracaine, intraocular Xylocaine.  Anesthesia standby required; patient given sodium pentothal intravenously during the period of retrobulbar blocking.  DESCRIPTION OF PROCEDURE:  After the patient was prepped and draped, a lid speculum was inserted in the right eye.  The eye was turned downward and a superior rectus traction suture placed.  Schiotz tonometry was recorded at 7 scale units with a 5.5 g weight.  A peritomy was performed adjacent to the limbus from the 11 to 1 oclock position.  The corneoscleral junction was cleaned and a corneoscleral groove made with a 45 degree Superblade.  The anterior chamber was then entered with the 2.5-mm diamond keratome at the 12 oclock position and the 15 degree blade at the 2 oclock position.  Using a bent 26-gauge needle on a Healon syringe, a circular capsulorrhexis was begun and then completed with the Grabow forceps.  Hydrodissection and hydrodelineation were performed using 1% Xylocaine.  The 30 degree phacoemulsification tip was then inserted with slow, controlled emulsification of the rather firm nucleus, total ultrasonic time -- 1 minute 26 seconds, average power level 20%, total amount of fluid  used -- 115 cc.  Following removal of the nucleus, the residual cortex was aspirated with the irrigation-aspiration tip.  The posterior capsule appeared intact with a brilliant red fundus reflex.  It was therefore elected to insert an [employer] SI40NB three-piece silicone posterior chamber intraocular lens implant with UV absorber, diopter strength +18.00.  This was inserted with the McDonald forceps into the anterior chamber and then centered into the capsular bag using the [facility] lens rotator; the lens appeared to be well-centered.  The Healon which had been used during the procedure was aspirated and replaced with balanced salt solution and Miochol ophthalmic solution.  The operative incision appeared to be slightly leaking and one single radial 10-0 nylon suture was used to secure the 12 oclock position.  Maxitrol ointment was instilled in the conjunctival cul-de-sac and a light patch and protective shield applied.  Duration of procedure and anesthesia administration -- 45 minutes.  Patient tolerated the procedure well in general and left the operating room for the recovery room in good condition. Dictated by:   Guadelupe Sabin, M.D. Attending Physician:  Ivor Messier DD:  04/04/01 TD:  04/04/01 Job: [id number]

## 2010-07-07 NOTE — H&P (Signed)
[facility]  Patient:    Melissa [name], Melissa [name]                         MRN: [id number] Attending:  Juluis Mire, M.D.                         History and Physical  HISTORY OF PRESENT ILLNESS:  This patient is a 77 year old gravida 1, para 1, postmenopausal white female who presents for hysteroscopic resection of endometrial polyp.  The patient has been on exogenous hormone replacement therapy.  Has experienced increasing bleeding.  A saline infusion ultrasound did reveal an endometrial polyp that we are presently going to resect with the hysteroscope.  ALLERGIES:  SULFA.  MEDICATIONS:  Prempro, Fosamax, vitamins, calcium, and zinc.  PAST MEDICAL HISTORY:  Usual child diseases, no significant sequelae.  PAST SURGICAL HISTORY:  Appendectomy and tonsillectomy.  OBSTETRICAL HISTORY:  One spontaneous vaginal delivery.  FAMILY HISTORY:  Noncontributory.  SOCIAL HISTORY:  No tobacco or alcohol use.  REVIEW OF SYSTEMS:  Noncontributory.  PHYSICAL EXAMINATION:  VITAL SIGNS:  The patient is afebrile with stable vital signs.  HEENT:  Patient normocephalic, atraumatic.  Pupils equal, round and reactive to light and accommodation.  Extraocular movements were intact.  Sclerae and conjunctivae clear.  Oropharynx clear.  NECK:  Without thyromegaly.  BREASTS:  No discrete masses.  LUNGS:  Clear.  CARDIOVASCULAR:  Regular rate without murmurs or gallops.  ABDOMEN:  Benign.  PELVIC:  Normal external genitalia.  Vaginal mucosa clear.  Cervix unremarkable.  Uterus usual size, shape, and contour.  Adnexa free of mass or tenderness.  EXTREMITIES:  Trace edema.  NEUROLOGIC:  Grossly within normal limits.  BASIC IMPRESSION:  Postmenopausal bleeding with endometrial polyp.  PLAN:  The patient is to undergo a hysteroscopic resection.  The risks have been discussed, including anesthetic concerns.  The risk of infection.  The risk of hemorrhage that could  require transfusion with the risk of AIDS or hepatitis.  Excessive bleeding could require history.  The risk of uterine perforation that could lead to injury to adjacent organs, including bladder or bowel, that could require diagnostic laparoscopy and exploratory laparotomy for management.  The rare risk of deep venous thrombosis and pulmonary embolus.  The patient expressed an understanding of indications and risks. DD:  11/23/99 TD:  11/23/99 Job: [id number]

## 2010-07-07 NOTE — H&P (Signed)
[place]. [facility]  Patient:    Melissa [name], Melissa [name] Visit Number: [id number] MRN: [id number]          Service Type: DSU Location: [facility] 2899 16 Attending Physician:  Ivor Messier Dictated by:   Guadelupe Sabin, M.D. Admit Date:  04/04/2001                           History and Physical  REASON FOR ADMISSION:  This was a planned outpatient surgical admission of this 77 year old white female admitted for cataract/implant surgery of the right eye.  PRESENT ILLNESS:  This patient has been followed in my office for routine eye care since 1998.  Initial examination at that time revealed a corrected acuity of 20/25 in each eye and essentially normal ocular exam.  Subsequently, the patient has gradually developed cataract formation in both eyes with increasing myopia.  The patient has elected to proceed with surgery at this time.  She signed an informed consent, noting smoky or dim vision; difficulty with street signs; she has given up night-driving; television is blurred; difficulty with reading, night vision, bright sunlight vision; difficulty with doing her hobbies and housework.  She has elected to proceed with surgery, signed an informed consent and arrangements made for her outpatient admission at this time.  PAST MEDICAL HISTORY:  Patient is in stable general health, under the care of Dr. Ann Maki T. Stoneking.  She is felt to be in a satisfactory condition for the proposed surgery.  Dr. Pete Glatter notes the patients allergy.  CURRENT MEDICATIONS:  Fosamax, calcium and VMI.  REVIEW OF SYSTEMS:  No cardiorespiratory complaints.  PHYSICAL EXAMINATION:  GENERAL:  Patient is a well-nourished, well-developed white female in no acute distress.  HEENT:  Eyes:  Visual acuity with best correction 20/50 -2, right eye; 20/40 -1, left eye.  Applanation tonometry 18 mm, right eye; 15, left eye. Slitlamp examination:  Brunescent nuclear cataract formations,  both eyes. Detailed fundus examination:  The vitreous is clear.  The retina is attached. 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  CHEST:  Lungs clear to percussion and auscultation.  HEART:  Normal sinus rhythm.  No cardiomegaly.  No murmurs.  ABDOMEN:  Negative.  EXTREMITIES:  Negative.  ADMISSION DIAGNOSIS:  Senile nuclear cataract, both eyes.  SURGICAL PLAN:  Cataract implant surgery, right eye now, left eye later. Dictated by:   Guadelupe Sabin, M.D. Attending Physician:  Ivor Messier DD:  04/04/01 TD:  04/04/01 Job: [id number]

## 2010-07-07 NOTE — Discharge Summary (Signed)
NAME:  Melissa [name], Melissa [name]                          ACCOUNT NO.:  [id number]   MEDICAL RECORD NO.:  [id number]                   PATIENT TYPE:  INP   LOCATION:  0467                                 FACILITY:  [facility]   PHYSICIAN:  Ollen Gross, M.D.                 DATE OF BIRTH:  [birth year]   DATE OF ADMISSION:  11/09/2002  DATE OF DISCHARGE:  11/13/2002                                 DISCHARGE SUMMARY   ADMITTING DIAGNOSES:  1. Osteoarthritis right knee.  2. Reflux disease.  3. Lumbar degenerative disk disease.   DISCHARGE DIAGNOSES:  1. Osteoarthritis right knee status post right total knee arthroplasty.  2. Left knee pain.  Status post cortisone injection left knee.  3. Reflux disease.  4. Lumbar degenerative disk disease.  5. Postoperative blood loss anemia.  6. Status post transfusion without sequelae.   PROCEDURE:  Date of surgery November 09, 2002.  Underwent a right total  knee arthroplasty.  Surgeon Ollen Gross, M.D.  Assistant Alexzandrew L.  Julien Girt, P.A.  Anesthesia general after a failed spinal.  Minimal blood  loss.  Hemovac drain x1.  Tourniquet time 42 minutes at 300 mmHg.  She also  underwent a cortisone injection to the opposite left knee.   CONSULTS:  None.   BRIEF HISTORY:  A 77 year old female with long history of right knee pain.  She does have bilateral knee pain but right is more symptomatic than left.  Has been ongoing for several months now.  She is seen in the office with end-  stage osteoarthritis.  Felt she would benefit from undergoing knee  replacement.  Risks and benefits of this discussed.  The patient  subsequently admitted to the hospital.   LABORATORY DATA:  CBC on admission:  Hemoglobin 12.1, hematocrit 36.0, white  cell count 7.8, red cell count 3.89.  Serial H&H were followed.  Hemoglobin  dropped to 8.4 and then later down to 7.7 and 22.9.  Given blood.  Post  transfusion hemoglobin 11.1 and 32.7.  Differential on admission CBC all  within normal limits.  PT/PTT on admission 13.2 and 30, respectively with  INR of 1.0.  Serial pro times followed.  Last noted PT/INR 18.9 and 1.9.  Chemistry panel on admission all within normal limits with the exception of  mildly elevated AST of 38, mildly elevated ALT of 42.  Serial BMETs were  followed.  Sodium did drop from 138 down to 133 back up to 138.  Glucose  went up from 83 to 182 back down to 114.  Calcium dropped from 9.6 to 7.6.   EKG dated November 05, 2002:  Normal sinus rhythm.  Normal EKG.  No  significant change since last tracing of April 01, 2001 confirmed by  Annabella Bing, M.D.   Chest x-ray November 05, 2002:  No active disease, probable mild COPD.   HOSPITAL COURSE:  The patient was admitted  to [facility], taken to  OR, underwent above stated procedure without complications.  The patient  tolerated procedure well.  Later returned to recovery room and then to  orthopedic floor for continued postoperative care.  Vital signs were  followed.  The patient was given 24 hours of postoperative IV antibiotics in  the form of Ancef.  Placed on Coumadin protocol.  Placed weightbearing as  tolerated.  PT and OT were consulted to assist with gait training,  ambulation, and ADLs.  Hemovac drain placed at time of surgery was pulled on  postoperative day one without difficulty.  Initially placed on PC  analgesics, later weaned over to p.o. medications by day two.  From the  therapy standpoint patient actually did very well with physical therapy up  ambulating approximately 30 feet and then later 60 feet by day two.  Dressing change initiated on postoperative day two.  Incision was healing  well.  Continued to progress well with physical therapy.  She did note to  have a drop in her hemoglobin postoperatively down to 8.0.  That was noted  on postoperative day two.  She was monitored.  PCA was discontinued  postoperative day two and the IV was heplocked.  It was  later noted that  blood dropped a little bit further down to 7.7 at which point she did  receive 2 units of blood.  Tolerated the blood quite well.  Had good  response.  Her hemoglobin came back up to 11.1 and 32.7.  Continued to  progress well with physical therapy ambulating approximately 100 feet by  postoperative day three and day four.  Dressing change initiated on  postoperative day two and observed daily.  Incision was healing well.  She  did so well with physical therapy by day four she was feeling good.  Been  seen in rounds by Ollen Gross, M.D.  Blood was back up to 11.1 after  transfusion.  She was feeling good and ready to go home.   DISCHARGE PLAN:  The patient discharged home on November 13, 2002.   DISCHARGE DIAGNOSES:  Please see above.   DISCHARGE MEDICATIONS:  1. Colace.  2. Coumadin.  3. Percocet.  4. Robaxin.   DIET:  As tolerated.   ACTIVITY:  Weightbearing as tolerated to the right lower extremity.  Home  health PT, home health nursing through [facility].  Total knee  protocol.  May start showering.  Follow up two weeks from surgery.  Call the  office for an appointment.   DISPOSITION:  Home.   CONDITION ON DISCHARGE:  Improving.     Alexzandrew L. Julien Girt, P.A.              Ollen Gross, M.D.    ALP/MEDQ  D:  12/16/2002  T:  12/16/2002  Job:  [id number]   cc:   Hal T. Stoneking, M.D.  [address] [place], [place] [address]  Fax: [phone]

## 2010-07-07 NOTE — Discharge Summary (Signed)
NAMEELSYE, MCCOLLISTER                ACCOUNT NO.:  [id number]   MEDICAL RECORD NO.:  [id number]          PATIENT TYPE:  INP   LOCATION:  0467                         [facility]ACILITY:  [facility]   PHYSICIAN:  Ollen Gross, M.D.    DATE OF BIRTH:  10/06/1928   DATE OF ADMISSION:  09/27/2003  DATE OF DISCHARGE:  10/01/2003                                 DISCHARGE SUMMARY   ADMISSION DIAGNOSES:  1.  Osteoarthritis, left knee.  2.  Status post right total knee arthroplasty 2004.  3.  Gastroesophageal reflux disease.  4.  Hemorrhoids.  5.  Osteoporosis.   DISCHARGE DIAGNOSES:  1.  Osteoarthritis, left knee, status post left total knee arthroplasty.  2.  Postoperative blood loss anemia.  3.  Status post right total knee arthroplasty 2004.  4.  Gastroesophageal reflux disease.  5.  Hemorrhoids.  6.  Osteoporosis.   PROCEDURE:  Date of surgery September 27, 2003. Left total knee arthroplasty.  Surgeon Dr. Trudee Grip. Assistant Avel Peace, P.A.-C. Anesthesia spinal.  Minimal blood loss. Hemovac drains x1. Tourniquet time 39 minutes at 300  mmHg.   CONSULTS:  None.   BRIEF HISTORY:  Melissa [name] is a 77 year old female with severe end-stage  arthritis of the left knee. She previously had a right total knee and now  presents for left total knee.   LABORATORY DATA:  CBC on admission:  Hemoglobin 11.6, hematocrit of 34.4,  white cell count 4.6, red cell count 3.70. Differential within normal  limits. Postoperative H and H 8.8 and 26.3. Hemoglobin declined down to 8.4,  stabilized and actually coming back up; it was back up to 8.5 and 25.6. PT  and PTT preoperatively 12.7 and 27 respectively with an INR of 0.9. Serial  protimes followed. Last noted PT/INR 20.7 and 2.2. Chem panel on admission  all within normal limits. Serial BMETs were followed. Electrolytes remained  within normal limits. Glucose went from 66 to 185 back to 117. Urinalysis:  Preop negative. Blood group type O positive.   EKG  dated September 22, 2003:  Normal sinus rhythm, no significant change since  last tracing, confirmed by Dr. Delfin Edis. Previous EKG also on November 05, 2003:  Normal sinus rhythm, no significant change since last tracing of  April 01, 2001 confirmed by Dr. [place] Bing.   HOSPITAL COURSE:  The patient admitted to [facility], taken to the  OR, underwent above-stated procedure without complication. The patient  tolerated the procedure well, taken to the recovery room and to the  orthopedic floor for continued postoperative care. The patient was placed on  p.o. and PCA analgesics for pain control following surgery. The patient had  a fair amount of nausea and vomiting after surgery requiring antiemetics,  and nausea improved by the morning of day #1. The PCA medications had been  changed. She was taking antiemetics as needed. She was noted to have some  postoperative anemia with a drop in her hemoglobin. She was not having any  symptoms of anemia. Therefore, her blood counts were just monitored. Fluids  were decreased. By day #  2, she was much improved. The nausea had completely  resolved. Her hemoglobin declined a further from 8.8 down to 8.4. She was  not having any symptoms. She was placed on iron supplements. From a therapy  standpoint, she actually did very well. By day #2, she was up ambulating  approximately 100 feet in the morning and 150 feet by that afternoon.  Dressing was changed also on day #2. Incision was healing well. By day #3,  she was doing much better, continued to progress well with physical therapy  and ambulating 400 feet. We did talk to her about her hemoglobin. It was  previously 8.4 and came up to 8.5, and she was not having any symptoms. We  are going to do therapy for one more day and set her up to go home on the  following day on October 01, 2003. She was doing quite well with her physical  therapy and ambulating and continued to ambulate about 400  feet. She was not  having any symptoms of anemia. She was tolerating her medications well. Home  arrangements have been made, and it was decided the patient could be  discharged home on October 01, 2003.   DISCHARGE PLAN:  The patient was discharged home on October 01, 2003.   DISCHARGE DIAGNOSES:  Please see above.   DISCHARGE MEDICATIONS:  1.  Coumadin.  2.  Trinsicon.  3.  Percocet.  4.  Robaxin.   DIET:  As tolerated.   ACTIVITY:  Weight bearing as tolerated. Home health PT, home health nursing.  Total knee protocol for left total knee.   FOLLOW UP:  Follow up two weeks from surgery.   DISPOSITION:  Home.   CONDITION ON DISCHARGE:  Improved.      ALP/MEDQ  D:  11/25/2003  T:  11/25/2003  Job:  [id number]   cc:   Hal T. Stoneking, M.D.  [address] [place], [place] [address]  Fax: [phone]

## 2010-07-07 NOTE — H&P (Signed)
NAME:  Melissa [name], Melissa [name] NO.:  [id number]   MEDICAL RECORD NO.:  [id number]                   PATIENT TYPE:   LOCATION:                                       FACILITY:   PHYSICIAN:  Alexzandrew L. Julien Girt, P.A.        DATE OF BIRTH:   DATE OF ADMISSION:  09/27/2003  DATE OF DISCHARGE:                                HISTORY & PHYSICAL   DATE OF OFFICE VISIT AND HISTORY & PHYSICAL:  September 09, 2003.   CHIEF COMPLAINT:  Left knee pain.   HISTORY OF PRESENT ILLNESS:  Melissa [name] is a 77 year old female seen by Dr.  Lequita Halt for ongoing bilateral knee pain. She has previously been seen and  operated on for her right knee back in 2004, and has done quite well with  regards to her right knee. She does have a known history of bilateral knee  pain and had osteoarthritis on both sides. The right knee has been taken  care of. Now she presents for continued pain in the left knee. She has  reached the point where she would like to have something done about this.  She has done well with the right knee and felt she would be a perfect  candidate for a left knee replacement. The risks and benefits were discussed  and the patient was subsequently admitted to the hospital.   ALLERGIES:  SULFA.   CURRENT MEDICATIONS:  1. Fosamax 70 mg weekly.  2. Prilosec daily.  3. Glucosamine and chondroitin b.i.d.  4. Citracal b.i.d.  5. Vision formula.  6. Dietary supplement daily.  7. Multivitamin daily.  8. Lactose when eating dairy products.  9. Extra Strength Tylenol as needed.   PAST MEDICAL HISTORY:  1. Reflux disease.  2. Osteoporosis.  3. History of hemorrhoids.   PAST SURGICAL HISTORY:  1. Right total knee replacement arthroplasty in 2004.  2. Colonoscopy.  3. Tonsillectomy.  4. Thyroidectomy.   SOCIAL HISTORY:  Married, retired, nonsmoker, no alcohol. Has two children.  Her husband will be assisting with care after surgery.   FAMILY HISTORY:  Significant for  heart disease.   REVIEW OF SYSTEMS:  GENERAL: No fever, chills, nightsweats. NEUROLOGIC: No  seizure, syncope, or paralysis. RESPIRATORY:  No shortness of breath,  productive cough, or hemoptysis. CARDIOVASCULAR: No chest pain, angina, or  orthopnea. GI: No nausea, vomiting, diarrhea, or constipation. GU: No  dysuria, hematuria, or discharge. MUSCULOSKELETAL: Pertinent to that of the  knee found in the history of present illness.   PHYSICAL EXAMINATION:  VITAL SIGNS: Pulse 64, respirations 12, blood  pressure 112/58.  GENERAL: The patient is a 77 year old white female, petite, thin framed,  well-nourished, well-developed, in no acute distress. She is an excellent  historian. She is accompanied by her husband.  HEENT:  Normocephalic and atraumatic. Pupils are round and reactive. The  patient does not wear glasses. EOMs are intact.  NECK: Supple. No carotid bruits are  appreciated.  HEART: Regular rate and rhythm.  No murmurs. S1 and S2 noted.  ABDOMEN: Soft, flat nontender. Bowel sounds are present.  RECTAL/BREASTS/GENITALIA: Not done; not pertinent to the present illness.  EXTREMITIES: Significant to the right leg, she has range of motion of 0-95  degrees. She has marked crepitus on passive range of motion. There is no  instability.   IMPRESSION:  1. Osteoarthritis, right knee.  2. Status post right total knee replacement arthroplasty, 2004.  3. Gastroesophageal reflux disease.  4. Hemorrhoids.  5. Osteoporosis.   PLAN:  The patient is admitted to [facility] and will undergo a  left total knee arthroplasty. Surgery will be performed by Dr. Ollen Gross.                                               Alexzandrew L. Julien Girt, P.A.    ALP/MEDQ  D:  09/10/2003  T:  09/11/2003  Job:  [id number]

## 2010-07-07 NOTE — Op Note (Signed)
NAME:  Melissa [name], Melissa [name]                          ACCOUNT NO.:  [id number]   MEDICAL RECORD NO.:  [id number]                   PATIENT TYPE:  INP   LOCATION:  0004                                 FACILITY:  [facility]   PHYSICIAN:  Ollen Gross, M.D.                 DATE OF BIRTH:  04-09-1928   DATE OF PROCEDURE:  11/09/2002  DATE OF DISCHARGE:                                 OPERATIVE REPORT   PREOPERATIVE DIAGNOSIS:  Osteoarthritis, right knee.   POSTOPERATIVE DIAGNOSIS:  Osteoarthritis, right knee.   PROCEDURES:  1. Right total knee arthroplasty.  2. Cortisone injection, left knee.   SURGEON:  Gus Rankin. Aluisio, M.D.   ASSISTANT:  Alexzandrew L. Julien Girt, P.A.   ANESTHESIA:  General after a failed spinal.   ESTIMATED BLOOD LOSS:  Minimal.   DRAIN:  Hemovac x 1.   COMPLICATIONS:  None.   TOURNIQUET TIME:  42 minutes at 300 mmHg.   CONDITION:  Stable to recovery.   BRIEF CLINICAL NOTE:  Melissa [name] is a 77 year old female with end-stage  osteoarthritis of the right knee with varus deformity, presents now for  right total knee arthroplasty.  She also has arthritis in the left knee and  desires a cortisone injection to the left knee in the same setting.   PROCEDURE IN DETAIL:  After attempted administration of spinal anesthetic,  the tourniquet is placed high on her right thigh and right lower extremity  prepped and draped in the usual sterile fashion.  Extremity was wrapped in  Esmarch, knee flexed, tourniquet inflated to 300 mmHg.  It was noted that  she was still able to move her leg and still had sensation upon pinching the  skin.  We thus deflated the tourniquet, and general anesthetic is  administered.  We then rewrapped the extremity with Esmarch, inflated the  tourniquet to 300 mmHg.  Standard midline incision is made with a 10 blade  through subcutaneous tissue to the level of the extensor mechanism.  A fresh  blade is used to make a medial parapatellar arthrotomy.   Then the soft  tissue over the proximal and medial tibia is subperiosteally elevated to the  joint line with a knife and at the semimembranosus bursa with a curved  osteotome.  The soft tissue over the proximal and lateral tibia is also  elevated with attention being paid to avoiding the patellar tendon on tibial  tubercle.  Patella is everted, knee flexed 90 degrees, ACL and PCL removed.  Drill is then used to create a starting hole in the distal femur, and the  canal is irrigated.  Five degree right valgus alignment guide is placed and  referencing off the posterior condyle, rotation is marked and a block pinned  to remove 10 mm off the distal femur.  Distal femoral resection is made with  an oscillating saw.   Sizing block is placed, and size  3 is most appropriate.  Rotation  corresponds with the epicondylar axis.  The block is pinned and the anterior  and posterior cuts made for a size 3.  Tibia is then subluxed forward, and  the menisci are removed.  Extramedullary tibial alignment guide is placed,  referencing proximally at the medial aspect of the tibial tubercle and  distally along the second metatarsal axis and tibial crest.  The block is  pinned to remove 10 mm off the nondeficient lateral side.  This would only  get [place] down until we were barely skimming the medial defect, so I went an  additional 2 mm, taking 12 mm off.  Tibia resection is made with an  oscillating saw.  The appropriate tibial size is a 2.5, and then the  proximal tibia is prepared with the modular drill and keel punch.  Femoral  preparation is then completed with the intercondylar and chamfer cuts for a  size 3.   Size 3 posterior stabilized femoral trial with 2 mobile bearing trial and  with a size 3, 12.5 mm posterior stabilized rotating platform insert trial  placed.  Full extension is achieved with excellent varus and valgus balance  throughout the full range of motion.  The patella is then everted,  thickness  measured to be 23 mm, free hand resection taken to 14 mm, 38 template  placed, lug holes drilled, trial patella placed, and it tracts normally.  Osteophytes are then removed off the posterior femur with the femoral trial  in place.  All trials are removed, and the cut bone surfaces are irrigated  with pulsatile lavage.  Cement is mixed and once ready for implantation, a  size 2.5 mobile bearing tibial tray, size 3 posterior stabilized femur, and  the 38 patella are cemented into place and patella is held with a clamp.  The 12.5 mm trial insert is placed, knee held in full extension, and all  extruded cement removed.  Once the cement is fully hardened, then the  permanent 12.5 mm posterior stabilized rotating platform insert is placed.  Full extension again is achieved with excellent varus and valgus balance  throughout the full range of motion.  The wound is then copiously irrigated  with antibiotic solution and extensor mechanism closed over a Hemovac drain  with interrupted #1 PDS.  Tourniquet is released for a total time of 42  minutes.  Flexion against gravity is 130 degrees.  Subcu is closed with  interrupted 2-0 Vicryl and subcuticular with running 4-0 Monocryl.  Incision  is cleaned and dried and Steri-Strips and a bulky sterile dressing applied.  She is subsequently awakened and transported to recovery in stable  condition.                                               Ollen Gross, M.D.    FA/MEDQ  D:  11/09/2002  T:  11/09/2002  Job:  [id number]

## 2010-07-07 NOTE — Op Note (Signed)
[facility]  Patient:    Melissa [name], Melissa [name]                       MRN: [id number] Proc. Date: 11/23/99 Adm. Date:  [id number] Attending:  Frederich Balding                           Operative Report  PREOPERATIVE DIAGNOSES:  Postmenopausal bleeding with endometrial polyp.  POSTOPERATIVE DIAGNOSES:  Postmenopausal bleeding with endometrial polyp.  PROCEDURE:  Dilation with subsequent hysteroscopy and resection of endometrial polyp followed by uterine curetting.  SURGEON:  Dr. Arelia Sneddon.  ANESTHESIA:  MAC with paracervical block.  ESTIMATED BLOOD LOSS:  Minimal.  PACKS/DRAINS:  None.  INTRAOPERATIVE BLOOD REPLACED:  None.  COMPLICATIONS:  None.  INDICATIONS:  Dictated in the history and physical.  DESCRIPTION OF PROCEDURE:  The patient was taken to the OR, placed in the supine position and after sedation was placed in the dorsal lithotomy position using the Allen stirrups. The patient was draped out for a hysteroscopy. A speculum was placed in the vault. A paracervical block was instituted using 2% ______. The anterior lip of the cervix was grasped with a single tooth tenaculum. The uterus sounded to 8 cm. The cervix was dilated to a size 33 Pratt dilator. The operating hysteroscope was then introduced. Visualization revealed a polyp in the anterior uterine wall. This was resected. This was sent for pathological review. We did also resection of the endometrium in all 4 quadrants to send for pathological review. Endometrial curettings were then obtained. There was no active vaginal bleeding or perforation. The single tooth tenaculum was then removed. The patient tolerated the procedure well and was returned to the recovery room in good condition. Sponge, instrument and needle count was reported as correct by the circulating nurse. DD:  11/23/99 TD:  11/23/99 Job: [id number]

## 2010-07-07 NOTE — H&P (Signed)
NAME:  Melissa [name], Melissa [name]                          ACCOUNT NO.:  [id number]   MEDICAL RECORD NO.:  [id number]                   PATIENT TYPE:  INP   LOCATION:  0467                                 FACILITY:  [facility]   PHYSICIAN:  Ollen Gross, M.D.                 DATE OF BIRTH:  10-14-1928   DATE OF ADMISSION:  11/09/2002  DATE OF DISCHARGE:                                HISTORY & PHYSICAL   CHIEF COMPLAINT:  Right knee pain.   HISTORY OF PRESENT ILLNESS:  The patient is a 77 year old female with a  history of right knee pain.  She actually has bilateral knee pain and the  right is more symptomatic than the left.  It has been ongoing for quite some  time now, has been progressive over the past several months.  She is seen in  the office and states that she is extremely active but the knee pain has  started to interfere with her activity and lifestyle.  X-rays when she is  seen in the office show severe end-stage arthritis with bone on bone changes  medial and also degenerative changes noted in the patellofemoral  compartment.  The patient states she continues to try to do her activities,  but is quite limited and would like to have something done about it.  Due to  the significant findings it is felt she would benefit from undergoing knee  replacement.  Risks and benefits of the procedure have been discussed with  the patient and she has elected to proceed with surgery.   ALLERGIES:  SULFA causes a rash.   CURRENT MEDICATIONS:  1. Fosamax 70 mg weekly.  2. Glucosamine daily.  3. Citrucel daily.  4. Prevacid 30 mg daily.  5. Vitamin daily.  6. Tylenol as needed.  7. Meletin 3 mg daily.  8. Lactase dietary supplement.  9. Beano supplement.   PAST MEDICAL HISTORY:  1. Reflux disease.  2. Lumbar degenerative disk disease.   PAST SURGICAL HISTORY:  1. Tonsillectomy in her teenage years.  2. Thyroidectomy in her teenage years.   SOCIAL HISTORY:  Married, nonsmoker.  No  alcohol.  Two children.  Her  husband will be assisting with her care after surgery.   FAMILY HISTORY:  Mother with heart disease and she has two brothers with  heart disease.   REVIEW OF SYSTEMS:  GENERAL:  No fevers, chills, night sweats.  NEUROLOGIC:  No seizures, syncope, paralysis.  RESPIRATORY:  No shortness of breath,  productive cough, or hemoptysis.  CARDIOVASCULAR:  No chest pain, angina,  orthopnea.  GASTROINTESTINAL:  No nausea, vomiting, diarrhea, constipation.  GENITOURINARY:  No dysuria, hematuria, discharge.  MUSCULOSKELETAL:  Pertinent to that of right knee found in the history of present illness.   PHYSICAL EXAMINATION:  VITAL SIGNS:  Pulse 60, respirations 12, blood  pressure 124/64.  GENERAL:  The patient is a 77 year old white female,  thin appearing, no  acute distress, alert, oriented, cooperative, pleasant at time of  examination.  HEENT:  Normocephalic, atraumatic.  Pupils round, reactive.  EOMs are  intact.  She is noted to wear glasses.  NECK:  Supple.  CHEST:  Clear anterior/posterior chest walls.  HEART:  Regular rate and rhythm.  S1, S2 noted.  No rubs, thrills,  palpitations.  ABDOMEN:  Soft, nontender.  Bowel sounds present.  RECTAL:  Not done.  Not pertinent to present illness.  BREASTS:  Not done.  Not pertinent to present illness.  GENITALIA:  Not done.  Not pertinent to present illness.  EXTREMITIES:  Right knee:  She does have a moderate varus deformity noted.  Range of motion of 0-100 degrees.  Marked crepitus is noted on passive range  of motion.  She does have a slight pseudo laxity on valgus stressing.  Motor  function is intact.   IMPRESSION:  1. Osteoarthritis right knee.  2. Reflux disease.  3. Lumbar degenerative disk disease.   PLAN:  The patient will be admitted to [facility], undergo a right  total knee arthroplasty.  Surgery will be performed by Ollen Gross, M.D.  Hal T. Stoneking, M.D. is the patient's medical  doctor.  Hal T. Stoneking,  M.D. will be consulted if needed for any medical assistance with this  patient throughout the hospital course.     Alexzandrew L. Julien Girt, P.A.              Ollen Gross, M.D.    ALP/MEDQ  D:  11/10/2002  T:  11/11/2002  Job:  [id number]   cc:   Hal T. Stoneking, M.D.  [address] [place], [place] [address]  Fax: [phone]

## 2011-03-12 DIAGNOSIS — R079 Chest pain, unspecified: Secondary | ICD-10-CM | POA: Diagnosis not present

## 2011-03-26 DIAGNOSIS — M810 Age-related osteoporosis without current pathological fracture: Secondary | ICD-10-CM | POA: Diagnosis not present

## 2011-04-26 DIAGNOSIS — M542 Cervicalgia: Secondary | ICD-10-CM | POA: Diagnosis not present

## 2011-08-14 DIAGNOSIS — M949 Disorder of cartilage, unspecified: Secondary | ICD-10-CM | POA: Diagnosis not present

## 2011-08-14 DIAGNOSIS — Z13820 Encounter for screening for osteoporosis: Secondary | ICD-10-CM | POA: Diagnosis not present

## 2011-08-14 DIAGNOSIS — Z1231 Encounter for screening mammogram for malignant neoplasm of breast: Secondary | ICD-10-CM | POA: Diagnosis not present

## 2011-08-14 DIAGNOSIS — Z01419 Encounter for gynecological examination (general) (routine) without abnormal findings: Secondary | ICD-10-CM | POA: Diagnosis not present

## 2011-08-14 DIAGNOSIS — M899 Disorder of bone, unspecified: Secondary | ICD-10-CM | POA: Diagnosis not present

## 2011-10-05 DIAGNOSIS — K6289 Other specified diseases of anus and rectum: Secondary | ICD-10-CM | POA: Diagnosis not present

## 2011-10-05 DIAGNOSIS — R350 Frequency of micturition: Secondary | ICD-10-CM | POA: Diagnosis not present

## 2011-10-10 DIAGNOSIS — H35379 Puckering of macula, unspecified eye: Secondary | ICD-10-CM | POA: Diagnosis not present

## 2011-10-18 DIAGNOSIS — S8000XA Contusion of unspecified knee, initial encounter: Secondary | ICD-10-CM | POA: Diagnosis not present

## 2011-11-13 DIAGNOSIS — Z0000 Encounter for general adult medical examination without abnormal findings: Secondary | ICD-10-CM | POA: Diagnosis not present

## 2011-11-13 DIAGNOSIS — Z1331 Encounter for screening for depression: Secondary | ICD-10-CM | POA: Diagnosis not present

## 2011-12-18 DIAGNOSIS — Z23 Encounter for immunization: Secondary | ICD-10-CM | POA: Diagnosis not present

## 2012-04-07 DIAGNOSIS — M25519 Pain in unspecified shoulder: Secondary | ICD-10-CM | POA: Diagnosis not present

## 2012-04-07 DIAGNOSIS — M25569 Pain in unspecified knee: Secondary | ICD-10-CM | POA: Diagnosis not present

## 2012-05-15 DIAGNOSIS — D649 Anemia, unspecified: Secondary | ICD-10-CM | POA: Diagnosis not present

## 2012-05-15 DIAGNOSIS — R5381 Other malaise: Secondary | ICD-10-CM | POA: Diagnosis not present

## 2012-05-15 DIAGNOSIS — D518 Other vitamin B12 deficiency anemias: Secondary | ICD-10-CM | POA: Diagnosis not present

## 2012-05-15 DIAGNOSIS — R5383 Other fatigue: Secondary | ICD-10-CM | POA: Diagnosis not present

## 2012-05-15 DIAGNOSIS — M1990 Unspecified osteoarthritis, unspecified site: Secondary | ICD-10-CM | POA: Diagnosis not present

## 2012-06-03 DIAGNOSIS — W57XXXA Bitten or stung by nonvenomous insect and other nonvenomous arthropods, initial encounter: Secondary | ICD-10-CM | POA: Diagnosis not present

## 2012-06-03 DIAGNOSIS — T148 Other injury of unspecified body region: Secondary | ICD-10-CM | POA: Diagnosis not present

## 2012-06-03 DIAGNOSIS — Z85828 Personal history of other malignant neoplasm of skin: Secondary | ICD-10-CM | POA: Diagnosis not present

## 2012-07-08 DIAGNOSIS — E039 Hypothyroidism, unspecified: Secondary | ICD-10-CM | POA: Diagnosis not present

## 2012-10-01 DIAGNOSIS — H26499 Other secondary cataract, unspecified eye: Secondary | ICD-10-CM | POA: Diagnosis not present

## 2012-10-06 ENCOUNTER — Other Ambulatory Visit: Payer: Self-pay

## 2012-10-06 DIAGNOSIS — Z1231 Encounter for screening mammogram for malignant neoplasm of breast: Secondary | ICD-10-CM

## 2012-11-06 ENCOUNTER — Other Ambulatory Visit: Payer: Self-pay | Admitting: Dermatology

## 2012-11-06 DIAGNOSIS — L570 Actinic keratosis: Secondary | ICD-10-CM | POA: Diagnosis not present

## 2012-11-06 DIAGNOSIS — Z85828 Personal history of other malignant neoplasm of skin: Secondary | ICD-10-CM | POA: Diagnosis not present

## 2012-11-06 DIAGNOSIS — C4442 Squamous cell carcinoma of skin of scalp and neck: Secondary | ICD-10-CM | POA: Diagnosis not present

## 2012-11-06 DIAGNOSIS — L821 Other seborrheic keratosis: Secondary | ICD-10-CM | POA: Diagnosis not present

## 2012-11-06 DIAGNOSIS — D485 Neoplasm of uncertain behavior of skin: Secondary | ICD-10-CM | POA: Diagnosis not present

## 2012-11-06 DIAGNOSIS — L820 Inflamed seborrheic keratosis: Secondary | ICD-10-CM | POA: Diagnosis not present

## 2012-11-06 DIAGNOSIS — L723 Sebaceous cyst: Secondary | ICD-10-CM | POA: Diagnosis not present

## 2012-11-17 DIAGNOSIS — M25519 Pain in unspecified shoulder: Secondary | ICD-10-CM | POA: Diagnosis not present

## 2012-11-25 ENCOUNTER — Ambulatory Visit
Admission: RE | Admit: 2012-11-25 | Discharge: 2012-11-25 | Disposition: A | Discharge status: Home / Self Care | Payer: Medicare Other | Source: Ambulatory Visit

## 2012-11-25 DIAGNOSIS — Z1231 Encounter for screening mammogram for malignant neoplasm of breast: Secondary | ICD-10-CM

## 2012-11-25 DIAGNOSIS — Z0000 Encounter for general adult medical examination without abnormal findings: Secondary | ICD-10-CM | POA: Diagnosis not present

## 2012-11-25 DIAGNOSIS — Z1331 Encounter for screening for depression: Secondary | ICD-10-CM | POA: Diagnosis not present

## 2012-11-25 DIAGNOSIS — Z79899 Other long term (current) drug therapy: Secondary | ICD-10-CM | POA: Diagnosis not present

## 2012-12-18 DIAGNOSIS — Z23 Encounter for immunization: Secondary | ICD-10-CM | POA: Diagnosis not present

## 2013-02-27 DIAGNOSIS — M19019 Primary osteoarthritis, unspecified shoulder: Secondary | ICD-10-CM | POA: Diagnosis not present

## 2013-02-27 DIAGNOSIS — M24119 Other articular cartilage disorders, unspecified shoulder: Secondary | ICD-10-CM | POA: Diagnosis not present

## 2013-02-27 DIAGNOSIS — M25819 Other specified joint disorders, unspecified shoulder: Secondary | ICD-10-CM | POA: Diagnosis not present

## 2013-02-27 DIAGNOSIS — S43499A Other sprain of unspecified shoulder joint, initial encounter: Secondary | ICD-10-CM | POA: Diagnosis not present

## 2013-02-27 DIAGNOSIS — M67919 Unspecified disorder of synovium and tendon, unspecified shoulder: Secondary | ICD-10-CM | POA: Diagnosis not present

## 2013-02-27 DIAGNOSIS — M7520 Bicipital tendinitis, unspecified shoulder: Secondary | ICD-10-CM | POA: Diagnosis not present

## 2013-02-27 DIAGNOSIS — M9420 Chondromalacia, unspecified site: Secondary | ICD-10-CM | POA: Diagnosis not present

## 2013-02-27 DIAGNOSIS — M719 Bursopathy, unspecified: Secondary | ICD-10-CM | POA: Diagnosis not present

## 2013-02-27 DIAGNOSIS — G8918 Other acute postprocedural pain: Secondary | ICD-10-CM | POA: Diagnosis not present

## 2013-03-04 DIAGNOSIS — M25519 Pain in unspecified shoulder: Secondary | ICD-10-CM | POA: Diagnosis not present

## 2013-03-04 DIAGNOSIS — M25819 Other specified joint disorders, unspecified shoulder: Secondary | ICD-10-CM | POA: Diagnosis not present

## 2013-03-04 DIAGNOSIS — Z9889 Other specified postprocedural states: Secondary | ICD-10-CM | POA: Diagnosis not present

## 2013-03-04 DIAGNOSIS — M6281 Muscle weakness (generalized): Secondary | ICD-10-CM | POA: Diagnosis not present

## 2013-03-06 DIAGNOSIS — M6281 Muscle weakness (generalized): Secondary | ICD-10-CM | POA: Diagnosis not present

## 2013-03-06 DIAGNOSIS — M25519 Pain in unspecified shoulder: Secondary | ICD-10-CM | POA: Diagnosis not present

## 2013-03-10 DIAGNOSIS — M6281 Muscle weakness (generalized): Secondary | ICD-10-CM | POA: Diagnosis not present

## 2013-03-10 DIAGNOSIS — M25519 Pain in unspecified shoulder: Secondary | ICD-10-CM | POA: Diagnosis not present

## 2013-03-12 DIAGNOSIS — M6281 Muscle weakness (generalized): Secondary | ICD-10-CM | POA: Diagnosis not present

## 2013-03-12 DIAGNOSIS — M25519 Pain in unspecified shoulder: Secondary | ICD-10-CM | POA: Diagnosis not present

## 2013-03-16 DIAGNOSIS — M25519 Pain in unspecified shoulder: Secondary | ICD-10-CM | POA: Diagnosis not present

## 2013-03-16 DIAGNOSIS — M6281 Muscle weakness (generalized): Secondary | ICD-10-CM | POA: Diagnosis not present

## 2013-03-17 DIAGNOSIS — M6281 Muscle weakness (generalized): Secondary | ICD-10-CM | POA: Diagnosis not present

## 2013-03-17 DIAGNOSIS — M25519 Pain in unspecified shoulder: Secondary | ICD-10-CM | POA: Diagnosis not present

## 2013-03-19 DIAGNOSIS — M25519 Pain in unspecified shoulder: Secondary | ICD-10-CM | POA: Diagnosis not present

## 2013-03-19 DIAGNOSIS — M6281 Muscle weakness (generalized): Secondary | ICD-10-CM | POA: Diagnosis not present

## 2013-03-20 DIAGNOSIS — M6281 Muscle weakness (generalized): Secondary | ICD-10-CM | POA: Diagnosis not present

## 2013-03-20 DIAGNOSIS — M25519 Pain in unspecified shoulder: Secondary | ICD-10-CM | POA: Diagnosis not present

## 2013-03-24 DIAGNOSIS — M6281 Muscle weakness (generalized): Secondary | ICD-10-CM | POA: Diagnosis not present

## 2013-03-24 DIAGNOSIS — M25519 Pain in unspecified shoulder: Secondary | ICD-10-CM | POA: Diagnosis not present

## 2013-03-27 DIAGNOSIS — M6281 Muscle weakness (generalized): Secondary | ICD-10-CM | POA: Diagnosis not present

## 2013-03-27 DIAGNOSIS — M25519 Pain in unspecified shoulder: Secondary | ICD-10-CM | POA: Diagnosis not present

## 2013-03-30 DIAGNOSIS — M6281 Muscle weakness (generalized): Secondary | ICD-10-CM | POA: Diagnosis not present

## 2013-03-30 DIAGNOSIS — M25519 Pain in unspecified shoulder: Secondary | ICD-10-CM | POA: Diagnosis not present

## 2013-04-03 DIAGNOSIS — M6281 Muscle weakness (generalized): Secondary | ICD-10-CM | POA: Diagnosis not present

## 2013-04-03 DIAGNOSIS — M25519 Pain in unspecified shoulder: Secondary | ICD-10-CM | POA: Diagnosis not present

## 2013-04-07 DIAGNOSIS — M6281 Muscle weakness (generalized): Secondary | ICD-10-CM | POA: Diagnosis not present

## 2013-04-07 DIAGNOSIS — M25519 Pain in unspecified shoulder: Secondary | ICD-10-CM | POA: Diagnosis not present

## 2013-04-08 DIAGNOSIS — M6281 Muscle weakness (generalized): Secondary | ICD-10-CM | POA: Diagnosis not present

## 2013-04-08 DIAGNOSIS — M25519 Pain in unspecified shoulder: Secondary | ICD-10-CM | POA: Diagnosis not present

## 2013-04-10 DIAGNOSIS — M6281 Muscle weakness (generalized): Secondary | ICD-10-CM | POA: Diagnosis not present

## 2013-04-10 DIAGNOSIS — M25519 Pain in unspecified shoulder: Secondary | ICD-10-CM | POA: Diagnosis not present

## 2013-04-13 DIAGNOSIS — M25519 Pain in unspecified shoulder: Secondary | ICD-10-CM | POA: Diagnosis not present

## 2013-04-13 DIAGNOSIS — M6281 Muscle weakness (generalized): Secondary | ICD-10-CM | POA: Diagnosis not present

## 2013-04-15 DIAGNOSIS — M25519 Pain in unspecified shoulder: Secondary | ICD-10-CM | POA: Diagnosis not present

## 2013-04-15 DIAGNOSIS — M6281 Muscle weakness (generalized): Secondary | ICD-10-CM | POA: Diagnosis not present

## 2013-04-20 DIAGNOSIS — M25519 Pain in unspecified shoulder: Secondary | ICD-10-CM | POA: Diagnosis not present

## 2013-04-20 DIAGNOSIS — M6281 Muscle weakness (generalized): Secondary | ICD-10-CM | POA: Diagnosis not present

## 2013-04-22 DIAGNOSIS — M25519 Pain in unspecified shoulder: Secondary | ICD-10-CM | POA: Diagnosis not present

## 2013-04-22 DIAGNOSIS — M6281 Muscle weakness (generalized): Secondary | ICD-10-CM | POA: Diagnosis not present

## 2013-04-24 DIAGNOSIS — M6281 Muscle weakness (generalized): Secondary | ICD-10-CM | POA: Diagnosis not present

## 2013-04-24 DIAGNOSIS — M25519 Pain in unspecified shoulder: Secondary | ICD-10-CM | POA: Diagnosis not present

## 2013-04-27 DIAGNOSIS — M6281 Muscle weakness (generalized): Secondary | ICD-10-CM | POA: Diagnosis not present

## 2013-04-27 DIAGNOSIS — M25519 Pain in unspecified shoulder: Secondary | ICD-10-CM | POA: Diagnosis not present

## 2013-04-29 DIAGNOSIS — M6281 Muscle weakness (generalized): Secondary | ICD-10-CM | POA: Diagnosis not present

## 2013-04-29 DIAGNOSIS — M25519 Pain in unspecified shoulder: Secondary | ICD-10-CM | POA: Diagnosis not present

## 2013-05-01 DIAGNOSIS — M6281 Muscle weakness (generalized): Secondary | ICD-10-CM | POA: Diagnosis not present

## 2013-05-01 DIAGNOSIS — M25519 Pain in unspecified shoulder: Secondary | ICD-10-CM | POA: Diagnosis not present

## 2013-05-04 DIAGNOSIS — M6281 Muscle weakness (generalized): Secondary | ICD-10-CM | POA: Diagnosis not present

## 2013-05-04 DIAGNOSIS — M25519 Pain in unspecified shoulder: Secondary | ICD-10-CM | POA: Diagnosis not present

## 2013-05-06 DIAGNOSIS — M6281 Muscle weakness (generalized): Secondary | ICD-10-CM | POA: Diagnosis not present

## 2013-05-06 DIAGNOSIS — M25519 Pain in unspecified shoulder: Secondary | ICD-10-CM | POA: Diagnosis not present

## 2013-05-08 DIAGNOSIS — M6281 Muscle weakness (generalized): Secondary | ICD-10-CM | POA: Diagnosis not present

## 2013-05-08 DIAGNOSIS — M25519 Pain in unspecified shoulder: Secondary | ICD-10-CM | POA: Diagnosis not present

## 2013-05-11 DIAGNOSIS — M6281 Muscle weakness (generalized): Secondary | ICD-10-CM | POA: Diagnosis not present

## 2013-05-11 DIAGNOSIS — M25519 Pain in unspecified shoulder: Secondary | ICD-10-CM | POA: Diagnosis not present

## 2013-05-13 DIAGNOSIS — M25519 Pain in unspecified shoulder: Secondary | ICD-10-CM | POA: Diagnosis not present

## 2013-05-13 DIAGNOSIS — M6281 Muscle weakness (generalized): Secondary | ICD-10-CM | POA: Diagnosis not present

## 2013-05-14 DIAGNOSIS — D239 Other benign neoplasm of skin, unspecified: Secondary | ICD-10-CM | POA: Diagnosis not present

## 2013-05-14 DIAGNOSIS — Z85828 Personal history of other malignant neoplasm of skin: Secondary | ICD-10-CM | POA: Diagnosis not present

## 2013-05-14 DIAGNOSIS — L570 Actinic keratosis: Secondary | ICD-10-CM | POA: Diagnosis not present

## 2013-05-14 DIAGNOSIS — L821 Other seborrheic keratosis: Secondary | ICD-10-CM | POA: Diagnosis not present

## 2013-05-15 DIAGNOSIS — M25519 Pain in unspecified shoulder: Secondary | ICD-10-CM | POA: Diagnosis not present

## 2013-05-15 DIAGNOSIS — M6281 Muscle weakness (generalized): Secondary | ICD-10-CM | POA: Diagnosis not present

## 2013-05-28 DIAGNOSIS — K219 Gastro-esophageal reflux disease without esophagitis: Secondary | ICD-10-CM | POA: Diagnosis not present

## 2013-05-28 DIAGNOSIS — M6281 Muscle weakness (generalized): Secondary | ICD-10-CM | POA: Diagnosis not present

## 2013-05-28 DIAGNOSIS — M25519 Pain in unspecified shoulder: Secondary | ICD-10-CM | POA: Diagnosis not present

## 2013-05-28 DIAGNOSIS — I359 Nonrheumatic aortic valve disorder, unspecified: Secondary | ICD-10-CM | POA: Diagnosis not present

## 2013-06-09 ENCOUNTER — Other Ambulatory Visit (HOSPITAL_COMMUNITY): Payer: Medicare Other

## 2013-06-11 ENCOUNTER — Other Ambulatory Visit (HOSPITAL_COMMUNITY): Payer: Self-pay | Admitting: Radiology

## 2013-06-11 ENCOUNTER — Ambulatory Visit (HOSPITAL_COMMUNITY): Payer: Medicare Other | Attending: Geriatric Medicine | Admitting: Radiology

## 2013-06-11 DIAGNOSIS — I2510 Atherosclerotic heart disease of native coronary artery without angina pectoris: Secondary | ICD-10-CM

## 2013-06-11 DIAGNOSIS — I359 Nonrheumatic aortic valve disorder, unspecified: Secondary | ICD-10-CM

## 2013-06-11 DIAGNOSIS — R011 Cardiac murmur, unspecified: Secondary | ICD-10-CM

## 2013-06-11 NOTE — Progress Notes (Signed)
Echocardiogram performed.  

## 2013-07-27 DIAGNOSIS — M25519 Pain in unspecified shoulder: Secondary | ICD-10-CM | POA: Diagnosis not present

## 2013-07-27 DIAGNOSIS — M25819 Other specified joint disorders, unspecified shoulder: Secondary | ICD-10-CM | POA: Diagnosis not present

## 2013-07-27 DIAGNOSIS — Z9889 Other specified postprocedural states: Secondary | ICD-10-CM | POA: Diagnosis not present

## 2013-07-31 ENCOUNTER — Other Ambulatory Visit: Payer: Self-pay | Admitting: Internal Medicine

## 2013-07-31 ENCOUNTER — Ambulatory Visit
Admission: RE | Admit: 2013-07-31 | Discharge: 2013-07-31 | Disposition: A | Discharge status: Home / Self Care | Payer: Medicare Other | Source: Ambulatory Visit | Attending: Internal Medicine | Admitting: Internal Medicine

## 2013-07-31 DIAGNOSIS — M25559 Pain in unspecified hip: Secondary | ICD-10-CM

## 2013-07-31 DIAGNOSIS — M5430 Sciatica, unspecified side: Secondary | ICD-10-CM | POA: Diagnosis not present

## 2013-07-31 DIAGNOSIS — M1610 Unilateral primary osteoarthritis, unspecified hip: Secondary | ICD-10-CM | POA: Diagnosis not present

## 2013-07-31 DIAGNOSIS — M169 Osteoarthritis of hip, unspecified: Secondary | ICD-10-CM | POA: Diagnosis not present

## 2013-08-04 DIAGNOSIS — M25819 Other specified joint disorders, unspecified shoulder: Secondary | ICD-10-CM | POA: Diagnosis not present

## 2013-08-12 DIAGNOSIS — Z9889 Other specified postprocedural states: Secondary | ICD-10-CM | POA: Diagnosis not present

## 2013-08-24 DIAGNOSIS — Z124 Encounter for screening for malignant neoplasm of cervix: Secondary | ICD-10-CM | POA: Diagnosis not present

## 2013-10-05 DIAGNOSIS — H26499 Other secondary cataract, unspecified eye: Secondary | ICD-10-CM | POA: Diagnosis not present

## 2013-10-06 DIAGNOSIS — Z9889 Other specified postprocedural states: Secondary | ICD-10-CM | POA: Diagnosis not present

## 2013-10-06 DIAGNOSIS — M12519 Traumatic arthropathy, unspecified shoulder: Secondary | ICD-10-CM | POA: Diagnosis not present

## 2013-10-06 DIAGNOSIS — M25519 Pain in unspecified shoulder: Secondary | ICD-10-CM | POA: Diagnosis not present

## 2013-11-16 ENCOUNTER — Encounter (HOSPITAL_COMMUNITY): Payer: Self-pay | Admitting: Pharmacy Technician

## 2013-11-18 ENCOUNTER — Encounter (HOSPITAL_COMMUNITY)
Admission: RE | Admit: 2013-11-18 | Discharge: 2013-11-18 | Disposition: A | Discharge status: Home / Self Care | Payer: Medicare Other | Source: Ambulatory Visit | Attending: Orthopedic Surgery | Admitting: Orthopedic Surgery

## 2013-11-18 ENCOUNTER — Encounter (HOSPITAL_COMMUNITY): Payer: Self-pay

## 2013-11-18 DIAGNOSIS — Z01812 Encounter for preprocedural laboratory examination: Secondary | ICD-10-CM | POA: Diagnosis not present

## 2013-11-18 DIAGNOSIS — M67919 Unspecified disorder of synovium and tendon, unspecified shoulder: Secondary | ICD-10-CM | POA: Insufficient documentation

## 2013-11-18 DIAGNOSIS — M719 Bursopathy, unspecified: Principal | ICD-10-CM | POA: Insufficient documentation

## 2013-11-18 DIAGNOSIS — Z01810 Encounter for preprocedural cardiovascular examination: Secondary | ICD-10-CM | POA: Diagnosis not present

## 2013-11-18 HISTORY — DX: Personal history of other medical treatment: Z92.89

## 2013-11-18 HISTORY — DX: Gastro-esophageal reflux disease without esophagitis: K21.9

## 2013-11-18 HISTORY — DX: Cardiac murmur, unspecified: R01.1

## 2013-11-18 HISTORY — DX: Pain in unspecified joint: M25.50

## 2013-11-18 HISTORY — DX: Unspecified osteoarthritis, unspecified site: M19.90

## 2013-11-18 HISTORY — DX: Other specified postprocedural states: R11.2

## 2013-11-18 HISTORY — DX: Other specified postprocedural states: Z98.890

## 2013-11-18 LAB — CBC WITH DIFFERENTIAL/PLATELET
BASOS PCT: 0 % (ref 0–1)
Basophils Absolute: 0 10*3/uL (ref 0.0–0.1)
EOS ABS: 0.1 10*3/uL (ref 0.0–0.7)
EOS PCT: 1 % (ref 0–5)
HCT: 31 % — ABNORMAL LOW (ref 36.0–46.0)
HEMOGLOBIN: 10.3 g/dL — AB (ref 12.0–15.0)
LYMPHS ABS: 1.4 10*3/uL (ref 0.7–4.0)
Lymphocytes Relative: 23 % (ref 12–46)
MCH: 30.8 pg (ref 26.0–34.0)
MCHC: 33.2 g/dL (ref 30.0–36.0)
MCV: 92.8 fL (ref 78.0–100.0)
MONO ABS: 0.7 10*3/uL (ref 0.1–1.0)
MONOS PCT: 11 % (ref 3–12)
NEUTROS PCT: 65 % (ref 43–77)
Neutro Abs: 4 10*3/uL (ref 1.7–7.7)
Platelets: 354 10*3/uL (ref 150–400)
RBC: 3.34 MIL/uL — ABNORMAL LOW (ref 3.87–5.11)
RDW: 15.2 % (ref 11.5–15.5)
WBC: 6.2 10*3/uL (ref 4.0–10.5)

## 2013-11-18 LAB — COMPREHENSIVE METABOLIC PANEL
ALBUMIN: 3.5 g/dL (ref 3.5–5.2)
ALT: 29 U/L (ref 0–35)
AST: 30 U/L (ref 0–37)
Alkaline Phosphatase: 98 U/L (ref 39–117)
Anion gap: 11 (ref 5–15)
BUN: 16 mg/dL (ref 6–23)
CALCIUM: 8.5 mg/dL (ref 8.4–10.5)
CO2: 26 mEq/L (ref 19–32)
CREATININE: 0.59 mg/dL (ref 0.50–1.10)
Chloride: 103 mEq/L (ref 96–112)
GFR calc Af Amer: >90 mL/min (ref >90)
GFR calc non Af Amer: 82 mL/min — ABNORMAL LOW (ref >90)
GLUCOSE: 113 mg/dL — AB (ref 70–99)
POTASSIUM: 4.5 meq/L (ref 3.7–5.3)
Sodium: 140 mEq/L (ref 137–147)
TOTAL PROTEIN: 7 g/dL (ref 6.0–8.3)
Total Bilirubin: <0.2 mg/dL — ABNORMAL LOW (ref 0.3–1.2)

## 2013-11-18 LAB — APTT: aPTT: 29 seconds (ref 24–37)

## 2013-11-18 LAB — PROTIME-INR
INR: 1.17 (ref 0.00–1.49)
PROTHROMBIN TIME: 14.9 s (ref 11.6–15.2)

## 2013-11-18 MED ORDER — CHLORHEXIDINE GLUCONATE 4 % EX LIQD: 60.0000 mL | Freq: Once | CUTANEOUS | Status: DC

## 2013-11-18 NOTE — Progress Notes (Signed)
Pt doesn't have a cardiologist  Echo done 06-11-13 and to be requested from [facility]  Denies ever having a stress test/heart cath  Medical MD is Dr.Hal Stoneking  Denies EKG or CXR in past yr

## 2013-11-18 NOTE — Pre-Procedure Instructions (Signed)
Melissa [name]  11/18/2013   Your procedure is scheduled on:  Thurs, Oct 8 @ 7:30 AM  Report to [facility]  at 5:30 AM.  Call this number if you have problems the morning of surgery: [phone]   Remember:   Do not eat food or drink liquids after midnight.   Take these medicines the morning of surgery with A SIP OF WATER: Omeprazole(Prilosec)              Stop taking your Multivitamin and Ibuprofen. No Goody's,BC's,Aleve,Aspirin,Fish Oil,or any Herbal Medications   Do not wear jewelry, make-up or nail polish.  Do not wear lotions, powders, or perfumes.   Do not shave 48 hours prior to surgery.   Do not bring valuables to the hospital.  [facility] is not responsible                  for any belongings or valuables.               Contacts, dentures or bridgework may not be worn into surgery.  Leave suitcase in the car. After surgery it may be brought to your room.  For patients admitted to the hospital, discharge time is determined by your                treatment team.               Patients discharged the day of surgery will not be allowed to drive  home.    Special Instructions:  [place] - Preparing for Surgery  Before surgery, you can play an important role.  Because skin is not sterile, your skin needs to be as free of germs as possible.  You can reduce the number of germs on you skin by washing with CHG (chlorahexidine gluconate) soap before surgery.  CHG is an antiseptic cleaner which kills germs and bonds with the skin to continue killing germs even after washing.  Please DO NOT use if you have an allergy to CHG or antibacterial soaps.  If your skin becomes reddened/irritated stop using the CHG and inform your nurse when you arrive at Short Stay.  Do not shave (including legs and underarms) for at least 48 hours prior to the first CHG shower.  You may shave your face.  Please follow these instructions carefully:   1.  Shower with CHG Soap the night before  surgery and the                                morning of Surgery.  2.  If you choose to wash your hair, wash your hair first as usual with your       normal shampoo.  3.  After you shampoo, rinse your hair and body thoroughly to remove the                      Shampoo.  4.  Use CHG as you would any other liquid soap.  You can apply chg directly       to the skin and wash gently with scrungie or a clean washcloth.  5.  Apply the CHG Soap to your body ONLY FROM THE NECK DOWN.        Do not use on open wounds or open sores.  Avoid contact with your eyes,       ears, mouth and genitals (private  parts).  Wash genitals (private parts)       with your normal soap.  6.  Wash thoroughly, paying special attention to the area where your surgery        will be performed.  7.  Thoroughly rinse your body with warm water from the neck down.  8.  DO NOT shower/wash with your normal soap after using and rinsing off       the CHG Soap.  9.  Pat yourself dry with a clean towel.            10.  Wear clean pajamas.            11.  Place clean sheets on your bed the night of your first shower and do not        sleep with pets.  Day of Surgery  Do not apply any lotions/deoderants the morning of surgery.  Please wear clean clothes to the hospital/surgery center.     Please read over the following fact sheets that you were given: Pain Booklet, Coughing and Deep Breathing and Surgical Site Infection Prevention

## 2013-11-19 NOTE — Progress Notes (Signed)
Anesthesia Chart Review:  Pt is 80 year old female scheduled for R shoulder reverse arthroplasty with Dr. Onnie Graham on 11/26/13.  PMH: Heart murmur. GERD.   Medication includes: prilosec, multivitamin.   Preoperative labs reviewed.  Hgb/hct 10.3/31 is stable compared with results 4-5 years ago.   EKG shows NSR, poor R wave progression. Confirmed by Wynonia Lawman who notes no significant change since previous EKG in 2005.   2D echo 06/11/13: - Left ventricle: The cavity size was normal. Wall thickness was normal. Systolic function was normal. The estimated ejection fraction was in the range of 55% to 60%. Features are consistent with a pseudonormal left ventricular filling pattern, with concomitant abnormal relaxation and increased filling pressure (grade 2 diastolic dysfunction). - Aortic valve: Mild regurgitation. - Mitral valve: Mild regurgitation.   If no changes, I anticipate pt can proceed with surgery as scheduled.    Willeen Cass, FNP-BC [facility] Short Stay Surgical Center/Anesthesiology Phone: [phone] 11/19/2013 1:23 PM

## 2013-11-25 MED ORDER — CEFAZOLIN SODIUM-DEXTROSE 2-3 GM-% IV SOLR
2.0000 g | INTRAVENOUS | Status: AC
  Administered 2013-11-26: 2 g via INTRAVENOUS
  Filled 2013-11-25: qty 50

## 2013-11-26 ENCOUNTER — Encounter (HOSPITAL_COMMUNITY): Payer: Self-pay | Admitting: *Deleted

## 2013-11-26 ENCOUNTER — Inpatient Hospital Stay (HOSPITAL_COMMUNITY)
Admission: RE | Admit: 2013-11-26 | Discharge: 2013-11-27 | DRG: 483 | Disposition: A | Discharge status: Home / Self Care | Payer: Medicare Other | Source: Ambulatory Visit | Attending: Orthopedic Surgery | Admitting: Orthopedic Surgery

## 2013-11-26 ENCOUNTER — Encounter (HOSPITAL_COMMUNITY)
Admission: RE | Disposition: A | Discharge status: Home / Self Care | Payer: Self-pay | Source: Ambulatory Visit | Attending: Orthopedic Surgery

## 2013-11-26 ENCOUNTER — Encounter (HOSPITAL_COMMUNITY): Payer: Medicare Other | Admitting: Emergency Medicine

## 2013-11-26 ENCOUNTER — Inpatient Hospital Stay (HOSPITAL_COMMUNITY): Payer: Medicare Other | Admitting: Certified Registered Nurse Anesthetist

## 2013-11-26 DIAGNOSIS — S43421S Sprain of right rotator cuff capsule, sequela: Secondary | ICD-10-CM

## 2013-11-26 DIAGNOSIS — K219 Gastro-esophageal reflux disease without esophagitis: Secondary | ICD-10-CM | POA: Diagnosis not present

## 2013-11-26 DIAGNOSIS — M129 Arthropathy, unspecified: Secondary | ICD-10-CM | POA: Diagnosis not present

## 2013-11-26 DIAGNOSIS — M19111 Post-traumatic osteoarthritis, right shoulder: Secondary | ICD-10-CM | POA: Diagnosis not present

## 2013-11-26 DIAGNOSIS — Z96619 Presence of unspecified artificial shoulder joint: Secondary | ICD-10-CM

## 2013-11-26 DIAGNOSIS — G8918 Other acute postprocedural pain: Secondary | ICD-10-CM | POA: Diagnosis not present

## 2013-11-26 DIAGNOSIS — M25511 Pain in right shoulder: Secondary | ICD-10-CM | POA: Diagnosis not present

## 2013-11-26 DIAGNOSIS — M19011 Primary osteoarthritis, right shoulder: Secondary | ICD-10-CM | POA: Diagnosis not present

## 2013-11-26 HISTORY — PX: REVERSE SHOULDER ARTHROPLASTY: SHX5054

## 2013-11-26 SURGERY — ARTHROPLASTY, SHOULDER, TOTAL, REVERSE
Anesthesia: Regional | Site: Shoulder | Laterality: Right

## 2013-11-26 MED ORDER — EPHEDRINE SULFATE 50 MG/ML IJ SOLN
INTRAMUSCULAR | Status: DC | PRN
  Administered 2013-11-26 (×2): 10 mg via INTRAVENOUS

## 2013-11-26 MED ORDER — ONDANSETRON HCL 4 MG/2ML IJ SOLN: 4.0000 mg | Freq: Four times a day (QID) | INTRAMUSCULAR | Status: DC | PRN

## 2013-11-26 MED ORDER — METOCLOPRAMIDE HCL 10 MG PO TABS: 5.0000 mg | ORAL_TABLET | Freq: Three times a day (TID) | ORAL | Status: DC | PRN

## 2013-11-26 MED ORDER — ALUM & MAG HYDROXIDE-SIMETH 200-200-20 MG/5ML PO SUSP: 30.0000 mL | ORAL | Status: DC | PRN

## 2013-11-26 MED ORDER — PHENYLEPHRINE HCL 10 MG/ML IJ SOLN
10.0000 mg | INTRAVENOUS | Status: DC | PRN
  Administered 2013-11-26: 20 ug/min via INTRAVENOUS

## 2013-11-26 MED ORDER — PROPOFOL 10 MG/ML IV BOLUS
INTRAVENOUS | Status: DC | PRN
  Administered 2013-11-26: 100 mg via INTRAVENOUS

## 2013-11-26 MED ORDER — DEXAMETHASONE SODIUM PHOSPHATE 4 MG/ML IJ SOLN
INTRAMUSCULAR | Status: DC | PRN
  Administered 2013-11-26: 4 mg via INTRAVENOUS

## 2013-11-26 MED ORDER — ACETAMINOPHEN 325 MG PO TABS
650.0000 mg | ORAL_TABLET | Freq: Four times a day (QID) | ORAL | Status: DC | PRN
Start: 2013-11-26 — End: 2013-11-27

## 2013-11-26 MED ORDER — BUPIVACAINE-EPINEPHRINE (PF) 0.5% -1:200000 IJ SOLN
INTRAMUSCULAR | Status: DC | PRN
  Administered 2013-11-26: 30 mL via PERINEURAL

## 2013-11-26 MED ORDER — DIAZEPAM 5 MG PO TABS: 2.5000 mg | ORAL_TABLET | Freq: Four times a day (QID) | ORAL | Status: DC | PRN

## 2013-11-26 MED ORDER — ONDANSETRON HCL 4 MG/2ML IJ SOLN
INTRAMUSCULAR | Status: AC
  Filled 2013-11-26: qty 2

## 2013-11-26 MED ORDER — OXYCODONE-ACETAMINOPHEN 5-325 MG PO TABS
1.0000 | ORAL_TABLET | ORAL | Status: DC | PRN
  Administered 2013-11-26 – 2013-11-27 (×4): 2 via ORAL
  Filled 2013-11-26 (×4): qty 2

## 2013-11-26 MED ORDER — OXYCODONE HCL 5 MG/5ML PO SOLN: 5.0000 mg | Freq: Once | ORAL | Status: DC | PRN

## 2013-11-26 MED ORDER — OMEPRAZOLE 20 MG PO CPDR
20.0000 mg | DELAYED_RELEASE_CAPSULE | Freq: Every day | ORAL | Status: DC
  Administered 2013-11-27: 20 mg via ORAL
  Filled 2013-11-26: qty 1

## 2013-11-26 MED ORDER — ONDANSETRON HCL 4 MG PO TABS: 4.0000 mg | ORAL_TABLET | Freq: Four times a day (QID) | ORAL | Status: DC | PRN

## 2013-11-26 MED ORDER — EPHEDRINE SULFATE 50 MG/ML IJ SOLN
INTRAMUSCULAR | Status: AC
  Filled 2013-11-26: qty 2

## 2013-11-26 MED ORDER — LIDOCAINE HCL (CARDIAC) 20 MG/ML IV SOLN
INTRAVENOUS | Status: AC
  Filled 2013-11-26: qty 5

## 2013-11-26 MED ORDER — DIPHENHYDRAMINE HCL 12.5 MG/5ML PO ELIX: 12.5000 mg | ORAL_SOLUTION | ORAL | Status: DC | PRN

## 2013-11-26 MED ORDER — MENTHOL 3 MG MT LOZG
1.0000 | LOZENGE | OROMUCOSAL | Status: DC | PRN
  Filled 2013-11-26: qty 9

## 2013-11-26 MED ORDER — FLEET ENEMA 7-19 GM/118ML RE ENEM: 1.0000 | ENEMA | Freq: Once | RECTAL | Status: AC | PRN

## 2013-11-26 MED ORDER — LIDOCAINE HCL (CARDIAC) 20 MG/ML IV SOLN
INTRAVENOUS | Status: DC | PRN
  Administered 2013-11-26: 70 mg via INTRAVENOUS

## 2013-11-26 MED ORDER — BISACODYL 10 MG RE SUPP: 10.0000 mg | Freq: Every day | RECTAL | Status: DC | PRN

## 2013-11-26 MED ORDER — OMEPRAZOLE MAGNESIUM 20 MG PO TBEC: 20.0000 mg | DELAYED_RELEASE_TABLET | Freq: Every day | ORAL | Status: DC

## 2013-11-26 MED ORDER — ONDANSETRON HCL 4 MG/2ML IJ SOLN
INTRAMUSCULAR | Status: DC | PRN
  Administered 2013-11-26: 4 mg via INTRAVENOUS

## 2013-11-26 MED ORDER — PROPOFOL 10 MG/ML IV EMUL
INTRAVENOUS | Status: AC
  Filled 2013-11-26: qty 200

## 2013-11-26 MED ORDER — METOCLOPRAMIDE HCL 5 MG/ML IJ SOLN: 5.0000 mg | Freq: Three times a day (TID) | INTRAMUSCULAR | Status: DC | PRN

## 2013-11-26 MED ORDER — ARTIFICIAL TEARS OP OINT
TOPICAL_OINTMENT | OPHTHALMIC | Status: DC | PRN
  Administered 2013-11-26: 1 via OPHTHALMIC

## 2013-11-26 MED ORDER — DIPHENHYDRAMINE HCL 50 MG/ML IJ SOLN
INTRAMUSCULAR | Status: AC
  Filled 2013-11-26: qty 1

## 2013-11-26 MED ORDER — HYDROMORPHONE HCL 1 MG/ML IJ SOLN: 0.2500 mg | INTRAMUSCULAR | Status: DC | PRN

## 2013-11-26 MED ORDER — CEFAZOLIN SODIUM 1-5 GM-% IV SOLN
1.0000 g | Freq: Four times a day (QID) | INTRAVENOUS | Status: AC
  Administered 2013-11-26 – 2013-11-27 (×3): 1 g via INTRAVENOUS
  Filled 2013-11-26 (×3): qty 50

## 2013-11-26 MED ORDER — SUCCINYLCHOLINE CHLORIDE 20 MG/ML IJ SOLN
INTRAMUSCULAR | Status: DC | PRN
  Administered 2013-11-26: 100 mg via INTRAVENOUS

## 2013-11-26 MED ORDER — SODIUM CHLORIDE 0.9 % IR SOLN
Status: DC | PRN
  Administered 2013-11-26: 1000 mL

## 2013-11-26 MED ORDER — SCOPOLAMINE 1 MG/3DAYS TD PT72
MEDICATED_PATCH | TRANSDERMAL | Status: AC
  Administered 2013-11-26: 1 via TRANSDERMAL
  Filled 2013-11-26: qty 1

## 2013-11-26 MED ORDER — LACTATED RINGERS IV SOLN
INTRAVENOUS | Status: DC
  Administered 2013-11-26: 20:00:00 via INTRAVENOUS

## 2013-11-26 MED ORDER — MIDAZOLAM HCL 5 MG/5ML IJ SOLN
INTRAMUSCULAR | Status: DC | PRN
  Administered 2013-11-26 (×2): 1 mg via INTRAVENOUS

## 2013-11-26 MED ORDER — FENTANYL CITRATE 0.05 MG/ML IJ SOLN
INTRAMUSCULAR | Status: AC
  Filled 2013-11-26: qty 5

## 2013-11-26 MED ORDER — LACTATED RINGERS IV SOLN: INTRAVENOUS | Status: DC

## 2013-11-26 MED ORDER — ROCURONIUM BROMIDE 50 MG/5ML IV SOLN
INTRAVENOUS | Status: AC
  Filled 2013-11-26: qty 1

## 2013-11-26 MED ORDER — SUCCINYLCHOLINE CHLORIDE 20 MG/ML IJ SOLN
INTRAMUSCULAR | Status: AC
  Filled 2013-11-26: qty 2

## 2013-11-26 MED ORDER — PROPOFOL 10 MG/ML IV BOLUS
INTRAVENOUS | Status: AC
  Filled 2013-11-26: qty 20

## 2013-11-26 MED ORDER — FENTANYL CITRATE 0.05 MG/ML IJ SOLN
INTRAMUSCULAR | Status: DC | PRN
  Administered 2013-11-26: 50 ug via INTRAVENOUS

## 2013-11-26 MED ORDER — ACETAMINOPHEN 650 MG RE SUPP: 650.0000 mg | Freq: Four times a day (QID) | RECTAL | Status: DC | PRN

## 2013-11-26 MED ORDER — DEXAMETHASONE SODIUM PHOSPHATE 4 MG/ML IJ SOLN
INTRAMUSCULAR | Status: AC
  Filled 2013-11-26: qty 1

## 2013-11-26 MED ORDER — OXYCODONE HCL 5 MG PO TABS: 5.0000 mg | ORAL_TABLET | Freq: Once | ORAL | Status: DC | PRN

## 2013-11-26 MED ORDER — KETOROLAC TROMETHAMINE 15 MG/ML IJ SOLN
15.0000 mg | Freq: Four times a day (QID) | INTRAMUSCULAR | Status: DC
  Administered 2013-11-26 – 2013-11-27 (×3): 15 mg via INTRAVENOUS
  Filled 2013-11-26 (×7): qty 1

## 2013-11-26 MED ORDER — ARTIFICIAL TEARS OP OINT
TOPICAL_OINTMENT | OPHTHALMIC | Status: AC
  Filled 2013-11-26: qty 3.5

## 2013-11-26 MED ORDER — DIPHENHYDRAMINE HCL 50 MG/ML IJ SOLN
INTRAMUSCULAR | Status: DC | PRN
  Administered 2013-11-26: 10 mg via INTRAVENOUS

## 2013-11-26 MED ORDER — POLYETHYLENE GLYCOL 3350 17 G PO PACK: 17.0000 g | PACK | Freq: Every day | ORAL | Status: DC | PRN

## 2013-11-26 MED ORDER — FENTANYL CITRATE 0.05 MG/ML IJ SOLN: 25.0000 ug | INTRAMUSCULAR | Status: DC | PRN

## 2013-11-26 MED ORDER — LACTATED RINGERS IV SOLN
INTRAVENOUS | Status: DC | PRN
  Administered 2013-11-26 (×2): via INTRAVENOUS

## 2013-11-26 MED ORDER — MIDAZOLAM HCL 2 MG/2ML IJ SOLN
INTRAMUSCULAR | Status: AC
  Filled 2013-11-26: qty 2

## 2013-11-26 MED ORDER — DOCUSATE SODIUM 100 MG PO CAPS
100.0000 mg | ORAL_CAPSULE | Freq: Two times a day (BID) | ORAL | Status: DC
  Administered 2013-11-26 – 2013-11-27 (×2): 100 mg via ORAL
  Filled 2013-11-26 (×2): qty 1

## 2013-11-26 MED ORDER — PHENYLEPHRINE HCL 10 MG/ML IJ SOLN
INTRAMUSCULAR | Status: DC | PRN
  Administered 2013-11-26: 80 ug via INTRAVENOUS
  Administered 2013-11-26: 40 ug via INTRAVENOUS

## 2013-11-26 MED ORDER — STERILE WATER FOR INJECTION IJ SOLN
INTRAMUSCULAR | Status: AC
  Filled 2013-11-26: qty 20

## 2013-11-26 MED ORDER — PHENOL 1.4 % MT LIQD: 1.0000 | OROMUCOSAL | Status: DC | PRN

## 2013-11-26 SURGICAL SUPPLY — 75 items
ADH SKN CLS APL DERMABOND .7 (GAUZE/BANDAGES/DRESSINGS) ×1
BIT DRILL 170X2.5X (BIT) IMPLANT
BIT DRL 170X2.5X (BIT)
BLADE SAW SGTL 83.5X18.5 (BLADE) ×3 IMPLANT
BRUSH FEMORAL CANAL (MISCELLANEOUS) IMPLANT
CAPT SHOULD DELTAXTEND CEM MOD ×2 IMPLANT
CEMENT BONE DEPUY (Cement) ×3 IMPLANT
CLOSURE WOUND 1/2 X4 (GAUZE/BANDAGES/DRESSINGS) ×1
COVER SURGICAL LIGHT HANDLE (MISCELLANEOUS) ×3 IMPLANT
DERMABOND ADVANCED (GAUZE/BANDAGES/DRESSINGS) ×2
DERMABOND ADVANCED .7 DNX12 (GAUZE/BANDAGES/DRESSINGS) IMPLANT
DRAPE INCISE IOBAN 66X45 STRL (DRAPES) ×3 IMPLANT
DRAPE SURG 17X11 SM STRL (DRAPES) ×3 IMPLANT
DRAPE U-SHAPE 47X51 STRL (DRAPES) ×3 IMPLANT
DRILL 2.5 (BIT)
DRILL BIT 7/64X5 (BIT) ×3 IMPLANT
DRSG AQUACEL AG ADV 3.5X10 (GAUZE/BANDAGES/DRESSINGS) ×3 IMPLANT
DRSG MEPILEX BORDER 4X8 (GAUZE/BANDAGES/DRESSINGS) IMPLANT
DURAPREP 26ML APPLICATOR (WOUND CARE) ×6 IMPLANT
ELECT BLADE 4.0 EZ CLEAN MEGAD (MISCELLANEOUS) ×3
ELECT CAUTERY BLADE 6.4 (BLADE) ×3 IMPLANT
ELECT REM PT RETURN 9FT ADLT (ELECTROSURGICAL) ×3
ELECTRODE BLDE 4.0 EZ CLN MEGD (MISCELLANEOUS) ×1 IMPLANT
ELECTRODE REM PT RTRN 9FT ADLT (ELECTROSURGICAL) ×1 IMPLANT
FACESHIELD WRAPAROUND (MASK) ×9 IMPLANT
FACESHIELD WRAPAROUND OR TEAM (MASK) ×3 IMPLANT
GLOVE BIO SURGEON STRL SZ7.5 (GLOVE) ×3 IMPLANT
GLOVE BIO SURGEON STRL SZ8 (GLOVE) ×3 IMPLANT
GLOVE EUDERMIC 7 POWDERFREE (GLOVE) ×3 IMPLANT
GLOVE SS BIOGEL STRL SZ 7.5 (GLOVE) ×1 IMPLANT
GLOVE SUPERSENSE BIOGEL SZ 7.5 (GLOVE) ×2
GOWN STRL REUS W/ TWL LRG LVL3 (GOWN DISPOSABLE) IMPLANT
GOWN STRL REUS W/ TWL XL LVL3 (GOWN DISPOSABLE) ×2 IMPLANT
GOWN STRL REUS W/TWL LRG LVL3 (GOWN DISPOSABLE)
GOWN STRL REUS W/TWL XL LVL3 (GOWN DISPOSABLE) ×6
HANDPIECE INTERPULSE COAX TIP (DISPOSABLE)
KIT BASIN OR (CUSTOM PROCEDURE TRAY) ×3 IMPLANT
KIT ROOM TURNOVER OR (KITS) ×3 IMPLANT
MANIFOLD NEPTUNE II (INSTRUMENTS) ×3 IMPLANT
NDL HYPO 25GX1X1/2 BEV (NEEDLE) IMPLANT
NDL SUT 6 .5 CRC .975X.05 MAYO (NEEDLE) IMPLANT
NEEDLE HYPO 25GX1X1/2 BEV (NEEDLE) IMPLANT
NEEDLE MAYO TAPER (NEEDLE)
NS IRRIG 1000ML POUR BTL (IV SOLUTION) ×3 IMPLANT
PACK SHOULDER (CUSTOM PROCEDURE TRAY) ×3 IMPLANT
PAD ARMBOARD 7.5X6 YLW CONV (MISCELLANEOUS) ×6 IMPLANT
PASSER SUT SWANSON 36MM LOOP (INSTRUMENTS) IMPLANT
PIN GUIDE 1.2 (PIN) IMPLANT
PIN GUIDE GLENOPHERE 1.5MX300M (PIN) ×2 IMPLANT
PIN METAGLENE 2.5 (PIN) IMPLANT
PRESSURIZER FEMORAL UNIV (MISCELLANEOUS) IMPLANT
SET HNDPC FAN SPRY TIP SCT (DISPOSABLE) IMPLANT
SLING ARM LRG ADULT FOAM STRAP (SOFTGOODS) IMPLANT
SLING ARM XL FOAM STRAP (SOFTGOODS) ×3 IMPLANT
SPONGE LAP 18X18 X RAY DECT (DISPOSABLE) ×6 IMPLANT
SPONGE LAP 4X18 X RAY DECT (DISPOSABLE) IMPLANT
STRIP CLOSURE SKIN 1/2X4 (GAUZE/BANDAGES/DRESSINGS) ×2 IMPLANT
SUCTION FRAZIER TIP 10 FR DISP (SUCTIONS) ×3 IMPLANT
SUT BONE WAX W31G (SUTURE) IMPLANT
SUT FIBERWIRE #2 38 T-5 BLUE (SUTURE) ×12
SUT MNCRL AB 3-0 PS2 18 (SUTURE) ×3 IMPLANT
SUT VIC AB 1 CT1 27 (SUTURE) ×3
SUT VIC AB 1 CT1 27XBRD ANBCTR (SUTURE) ×1 IMPLANT
SUT VIC AB 2-0 CT1 27 (SUTURE) ×3
SUT VIC AB 2-0 CT1 TAPERPNT 27 (SUTURE) ×1 IMPLANT
SUT VIC AB 2-0 SH 27 (SUTURE)
SUT VIC AB 2-0 SH 27X BRD (SUTURE) IMPLANT
SUTURE FIBERWR #2 38 T-5 BLUE (SUTURE) IMPLANT
SYR 30ML SLIP (SYRINGE) ×3 IMPLANT
SYR CONTROL 10ML LL (SYRINGE) IMPLANT
TOWEL OR 17X24 6PK STRL BLUE (TOWEL DISPOSABLE) ×3 IMPLANT
TOWEL OR 17X26 10 PK STRL BLUE (TOWEL DISPOSABLE) ×3 IMPLANT
TOWER CARTRIDGE SMART MIX (DISPOSABLE) IMPLANT
TRAY FOLEY CATH 16FRSI W/METER (SET/KITS/TRAYS/PACK) IMPLANT
WATER STERILE IRR 1000ML POUR (IV SOLUTION) ×3 IMPLANT

## 2013-11-26 NOTE — Discharge Instructions (Signed)
° °  Kevin M. Supple, M.D., F.A.A.O.S. °Orthopaedic Surgery °Specializing in Arthroscopic and Reconstructive °Surgery of the Shoulder and Knee °[phone] °[address] - [place], [place] [address] - Fax [phone] ° ° °POST-OP TOTAL SHOULDER REPLACEMENT/SHOULDER HEMIARTHROPLASTY INSTRUCTIONS ° °1. Call the office at [phone] to schedule your first post-op appointment 10-14 days from the date of your surgery. ° °2. The bandage over your incision is waterproof. You may begin showering with this dressing on. You may leave this dressing on until first follow up appointment within 2 weeks. If you would like to remove it you may do so after the 5th day. Go slow and tug at the borders gently to break the bond the dressing has with the skin. The steri strips may come off with the dressing. At this point if there is no drainage it is okay to go without a bandage or you may cover it with a light guaze and tape. Leave the steri-strips in place over your incision. You can expect drainage that is bloody or yellow in nature that should gradually decrease from day of surgery. Change your dressing daily until drainage is completely resolved, then you may feel free to go without a bandage. You can also expect significant bruising around your shoulder that will drift down your arm and into your chest wall. This is very normal and should resolve over several days. ° ° 3. Wear your sling/immobilizer at all times except to perform the exercises below or to occasionally let your arm dangle by your side to stretch your elbow. You also need to sleep in your sling immobilizer until instructed otherwise. ° °4. Range of motion to your elbow, wrist, and hand are encouraged 3-5 times daily. Exercise to your hand and fingers helps to reduce swelling you may experience. ° °5. Utilize ice to the shoulder 3-5 times minimum a day and additionally if you are experiencing pain. ° °6. Prescriptions for a pain medication and a muscle  relaxant are provided for you. It is recommended that if you are experiencing pain that you pain medication alone is not controlling, add the muscle relaxant along with the pain medication which can give additional pain relief. The first 1-2 days is generally the most severe of your pain and then should gradually decrease. As your pain lessens it is recommended that you decrease your use of the pain medications to an "as needed basis'" only and to always comply with the recommended dosages of the pain medications. ° °7. Pain medications can produce constipation along with their use. If you experience this, the use of an over the counter stool softener or laxative daily is recommended.  ° °8. For most patients, if insurance allows, home health services to include therapy has been arranged. ° °9. For additional questions or concerns, please do not hesitate to call the office. If after hours there is an answering service to forward your concerns to the physician on call. ° °POST-OP EXERCISES ° °Pendulum Exercises ° °Perform pendulum exercises while standing and bending at the waist. Support your uninvolved arm on a table or chair and allow your operated arm to hang freely. Make sure to do these exercises passively - not using you shoulder muscles. ° °Repeat 20 times. Do 3 sessions per day. ° ° ° ° °

## 2013-11-26 NOTE — Anesthesia Procedure Notes (Addendum)
Anesthesia Regional Block:  Interscalene brachial plexus block  Pre-Anesthetic Checklist: ,, timeout performed, Correct Patient, Correct Site, Correct Laterality, Correct Procedure, Correct Position, site marked, Risks and benefits discussed,  Surgical consent,  Pre-op evaluation,  At surgeon's request and post-op pain management  Laterality: Right  Prep: chloraprep       Needles:  Injection technique: Single-shot  Needle Type: Echogenic Stimulator Needle     Needle Length: 5cm 5 cm Needle Gauge: 22 and 22 G    Additional Needles:  Procedures: ultrasound guided (picture in chart) and nerve stimulator Interscalene brachial plexus block  Nerve Stimulator or Paresthesia:  Response: biceps flexion, 0.45 mA,   Additional Responses:   Narrative:  Start time: 11/26/2013 7:00 AM End time: 11/26/2013 7:12 AM Injection made incrementally with aspirations every 5 mL.  Performed by: Personally  Anesthesiologist: Dr Marcie Bal  Additional Notes: Functioning IV was confirmed and monitors were applied.  A 98mm 22ga Arrow echogenic stimulator needle was used. Sterile prep and drape,hand hygiene and sterile gloves were used.  Negative aspiration and negative test dose prior to incremental administration of local anesthetic. The patient tolerated the procedure well.  Ultrasound guidance: relevent anatomy identified, needle position confirmed, local anesthetic spread visualized around nerve(s), vascular puncture avoided.  Image printed for medical record.    Procedure Name: Intubation Date/Time: 11/26/2013 7:44 AM Performed by: Ollen Bowl Pre-anesthesia Checklist: Patient identified, Timeout performed, Emergency Drugs available, Suction available and Patient being monitored Patient Re-evaluated:Patient Re-evaluated prior to inductionOxygen Delivery Method: Circle system utilized and Simple face mask Preoxygenation: Pre-oxygenation with 100% oxygen Intubation Type: IV  induction Ventilation: Mask ventilation without difficulty Laryngoscope Size: Mac and 3 Grade View: Grade II Tube type: Oral Tube size: 7.0 mm Number of attempts: 1 Airway Equipment and Method: Patient positioned with wedge pillow and Stylet Placement Confirmation: ETT inserted through vocal cords under direct vision,  positive ETCO2 and breath sounds checked- equal and bilateral Secured at: 21 cm Tube secured with: Tape Dental Injury: Teeth and Oropharynx as per pre-operative assessment

## 2013-11-26 NOTE — Plan of Care (Signed)
Problem: Consults Goal: Diagnosis- Total Joint Replacement R Reverse total shoulder Replacement

## 2013-11-26 NOTE — Op Note (Signed)
NAMEBRINLEIGH, [name]                ACCOUNT NO.:  [id number]  MEDICAL RECORD NO.:  [id number]  LOCATION:  [facility]                         FACILITY:  [facility]  PHYSICIAN:  Metta Clines. Suzanne Garbers, M.D.  DATE OF BIRTH:  1928-06-03  DATE OF PROCEDURE:  11/26/2013 DATE OF DISCHARGE:                              OPERATIVE REPORT   PREOPERATIVE DIAGNOSIS:  End-stage right shoulder rotator cuff tear arthropathy.  POSTOPERATIVE DIAGNOSIS:  End-stage right shoulder rotator cuff tear arthropathy.  PROCEDURE:  A right shoulder reverse arthroplasty utilizing a press-fit size 12 centered stem with a +6 poly, and a 38 eccentric glenosphere.  SURGEON:  Metta Clines. Khadijatou Borak, MD  ASSISTANT:  Reather Laurence. Shuford, PA-C  ANESTHESIA:  General endotracheal as well as an interscalene block.  ESTIMATED BLOOD LOSS:  200 mL.  DRAINS:  None.  HISTORY:  Melissa [name] is an 80 year old female with chronic and progressive increasing right shoulder pain related to an end-stage rotator cuff tear arthropathy.  She has had a previous attempted rotator cuff repair, now has MRI scan evidence for complete chronic rupture of the rotator cuff tear, high-riding humeral head and severe pain with increasing functional limitations and is now brought to the operating room for planned right shoulder reverse arthroplasty.  Preoperatively, I counseled Melissa [name] regarding treatment options and risks versus benefits thereof.  Possible surgical complications were all reviewed including the potential for bleeding, infection, neurovascular injury, persistent pain, loss of motion, anesthetic complication, failure of the implant, and possible need for additional surgery.  She understands and accepts and agrees with our planned procedure.  PROCEDURE IN DETAIL:  After undergoing routine preop evaluation, the patient received prophylactic antibiotics and an interscalene block was established in the holding area by the Anesthesia Department.   Placed supine on the operating table, underwent smooth induction of a general endotracheal anesthesia.  Placed into beach-chair position and appropriately padded and protected.  The right shoulder girdle region was sterilely prepped and draped in standard fashion.  A time-out was called.  An anterior approach to the right shoulder was made through a 10 cm deltopectoral incision.  Skin flaps were elevated and electrocautery was used for hemostasis.  The deltopectoral interval was then developed from proximal to distal sweeping the cephalic vein laterally and the upper cm half of the pec major was tenotomized to enhance exposure.  Adhesions beneath the deltoid was then divided and the conjoint tendon was identified, mobilized, and retracted medially. At this point, we divided the subscapularis away from the lesser tuberosity and tagged free margin for later repair.  Deficiency of rotator cuff superiorly was noted and previous suture anchors and suture material was removed.  Humeral head was delivered through the wound and we gained access into the humeral medullary canal then hand reamed this up to size 12.  The intramedullary guide was placed and we performed a humeral head resection at the appropriate level with an oscillating saw. I then removed the peripheral osteophytes.  A metal cap was placed over the cut surface of the proximal humerus.  We then exposed the glenoid with combination of Fukuda, snake tongue, and pitchfork retractors.  The subscapularis was mobilized, retracted medially.  The remnant of the glenoid labrum was then excised and complete exposure of the periphery of the glenoid was then achieved.  We placed a central guide pin and then reamed the glenoid to a stable subchondral bony bed.  Residual soft tissue bone debrided the margins and then removed the rongeur.  Glenoid was irrigated.  We placed our central drill hole.  At this point, our glenoid based plate was then  impacted into position and then we transfixed the base plate using inferior and superior locking screws and a posterior nonlocking screw with excellent bony purchase fixation and good stability.  The locking screw was terminally tightened.  A 38 eccentric glenosphere was then placed over the base plate and tightened and sequentially impacted and then re-tightened with excellent fixation. At this point, we then returned our attention to the proximal humerus where the metaphysis was then prepared with a size 1 central metaphyseal reamer.  We then placed our trial implant with good fit and fixation. The trial implant was then removed from the humeral stem.  We obtained and assembled the final press-fit construct, centralized metaphysis and this was impacted at an  appropriate level with excellent fixation.  I then performed a series of trial reductions and ultimately found that the +6 poly showed the best soft tissue balance and good stability of the shoulder.  The implants were then dislocated.  The trials were removed.  Trial poly was removed.  We impacted the final +6 poly.  Final reduction was performed.  The shoulder was taken through range of motion showing excellent motion, good stability, and good soft tissue balance. The wound was then copiously irrigated.  Hemostasis was obtained.  The subscapularis was then repaired back to the proximal humerus with #2 FiberWire, then impacted bone tunnels.  The deltopectoral interval was then reapproximated with a series of figure-of-eight #1 Vicryl sutures. A 2-0 Vicryl was used for the subcu layer, intracuticular 3-0 Monocryl for the skin, followed by Dermabond dry Aquacel dressing.  Right arm was placed in a sling and the patient was awakened, extubated, and taken to the recovery room in stable condition.  Olivia Mackie Shuford PA-C was used as an Environmental consultant throughout this case essential for help with positioning the patient, positioning of the  extremity, soft tissue, manipulation, implantation of the wound of the prosthesis, wound closure, and intraoperative decision making.     Metta Clines. Stacie Knutzen, M.D.     KMS/MEDQ  D:  11/26/2013  T:  11/26/2013  Job:  [id number]

## 2013-11-26 NOTE — Anesthesia Postprocedure Evaluation (Signed)
Anesthesia Post Note  Patient: Melissa [name]  Procedure(s) Performed: Procedure(s) (LRB): RIGHT REVERSE SHOULDER ARTHROPLASTY (Right)  Anesthesia type: General  Patient location: PACU  Post pain: Pain level controlled and Adequate analgesia  Post assessment: Post-op Vital signs reviewed, Patient's Cardiovascular Status Stable, Respiratory Function Stable, Patent Airway and Pain level controlled  Last Vitals:  Filed Vitals:   11/26/13 1030  BP: 134/57  Pulse: 70  Temp:   Resp: 14    Post vital signs: Reviewed and stable  Level of consciousness: awake, alert  and oriented  Complications: No apparent anesthesia complications

## 2013-11-26 NOTE — H&P (Signed)
Melissa [name]    Chief Complaint: RIGHT SHOULDER ROTATOR CUFF TEAR ARTHROPATHY HPI: The patient is a 80 y.o. female with end stage right shoulder rotator cuff tear arthropathy  Past Medical History  Diagnosis Date  . PONV (postoperative nausea and vomiting)   . Heart murmur     echo done in 2015  . Headache(784.0)     migraine last one 11-17-13  . Arthritis   . Joint pain   . GERD (gastroesophageal reflux disease)     takes OMeprazole daily  . History of blood transfusion     no abnormal reaction noted     Past Surgical History  Procedure Laterality Date  . Tonsillectomy    . Thyroidectomy  at age 24  . Joint replacement Bilateral     knee  . Shoulder arthroscopy Right   . Colonoscopy    . Cataract surgery  Bilateral     History reviewed. No pertinent family history.  Social History:  reports that she has never smoked. She does not have any smokeless tobacco history on file. She reports that she does not drink alcohol or use illicit drugs.  Allergies:  Allergies  Allergen Reactions  . Sulfonamide Derivatives Hives    Medications Prior to Admission  Medication Sig Dispense Refill  . ibuprofen (ADVIL,MOTRIN) 200 MG tablet Take 400-600 mg by mouth every 6 (six) hours as needed (for pain).      . Multiple Vitamin (MULTIVITAMIN WITH MINERALS) TABS tablet Take 1 tablet by mouth daily.      Marland Kitchen omeprazole (PRILOSEC OTC) 20 MG tablet Take 20 mg by mouth daily.         Physical Exam: right shoulder with painful and restricted motion as noted at recent office visits  Vitals  Temp:  [96.9 F (36.1 C)] 96.9 F (36.1 C) (10/08 0611) Pulse Rate:  [69-80] 77 (10/08 0724) Resp:  [18] 18 (10/08 0611) BP: (143)/(46) 143/46 mmHg (10/08 0611) SpO2:  [100 %] 100 % (10/08 0611) Weight:  [58.65 kg (129 lb 4.8 oz)] 58.65 kg (129 lb 4.8 oz) (10/08 0611)  Assessment/Plan  Impression: RIGHT SHOULDER ROTATOR CUFF TEAR ARTHROPATHY  Plan of Action: Procedure(s): RIGHT REVERSE  SHOULDER ARTHROPLASTY  Melissa [name] M 11/26/2013, 7:28 AM

## 2013-11-26 NOTE — Anesthesia Preprocedure Evaluation (Addendum)
Anesthesia Evaluation  Patient identified by MRN, date of birth, ID band Patient awake    Reviewed: Allergy & Precautions, H&P , NPO status , Patient's Chart, lab work & pertinent test results  History of Anesthesia Complications (+) PONV  Airway Mallampati: II  Neck ROM: full    Dental  (+) Teeth Intact, Dental Advisory Given   Pulmonary          Cardiovascular negative cardio ROS  + Valvular Problems/Murmurs     Neuro/Psych  Headaches,    GI/Hepatic GERD-  ,  Endo/Other    Renal/GU      Musculoskeletal  (+) Arthritis -,   Abdominal   Peds  Hematology   Anesthesia Other Findings   Reproductive/Obstetrics                          Anesthesia Physical Anesthesia Plan  ASA: II  Anesthesia Plan: General and Regional   Post-op Pain Management: MAC Combined w/ Regional for Post-op pain   Induction: Intravenous  Airway Management Planned: Oral ETT  Additional Equipment:   Intra-op Plan:   Post-operative Plan: Extubation in OR  Informed Consent: I have reviewed the patients History and Physical, chart, labs and discussed the procedure including the risks, benefits and alternatives for the proposed anesthesia with the patient or authorized representative who has indicated his/her understanding and acceptance.     Plan Discussed with: CRNA, Anesthesiologist and Surgeon  Anesthesia Plan Comments:         Anesthesia Quick Evaluation

## 2013-11-26 NOTE — Progress Notes (Signed)
Utilization review completed.  

## 2013-11-26 NOTE — Transfer of Care (Signed)
Immediate Anesthesia Transfer of Care Note  Patient: Melissa [name]  Procedure(s) Performed: Procedure(s): RIGHT REVERSE SHOULDER ARTHROPLASTY (Right)  Patient Location: PACU  Anesthesia Type:GA combined with regional for post-op pain  Level of Consciousness: awake and alert   Airway & Oxygen Therapy: Patient Spontanous Breathing and Patient connected to nasal cannula oxygen  Post-op Assessment: Report given to PACU RN and Post -op Vital signs reviewed and stable  Post vital signs: Reviewed and stable  Complications: No apparent anesthesia complications

## 2013-11-26 NOTE — Op Note (Signed)
11/26/2013  9:14 AM  PATIENT:   Melissa [name]  80 y.o. female  PRE-OPERATIVE DIAGNOSIS:  RIGHT SHOULDER ROTATOR CUFF TEAR ARTHROPATHY  POST-OPERATIVE DIAGNOSIS:  same  PROCEDURE:  Right reverse shoulder arthroplasty #12 press fit, centered stem, +6 poly, 38 eccentric glenosphere  SURGEON:  Yong Grieser, Metta Clines M.D.  ASSISTANTS: Shuford pac   ANESTHESIA:   GET + ISB  EBL: 200  SPECIMEN:  none  Drains: none   PATIENT DISPOSITION:  PACU - hemodynamically stable.    PLAN OF CARE: Admit to inpatient   Dictation# [id number]

## 2013-11-27 ENCOUNTER — Encounter (HOSPITAL_COMMUNITY): Payer: Self-pay | Admitting: Orthopedic Surgery

## 2013-11-27 MED ORDER — OXYCODONE-ACETAMINOPHEN 5-325 MG PO TABS: 1.0000 | ORAL_TABLET | ORAL | Status: DC | PRN

## 2013-11-27 MED ORDER — DIAZEPAM 5 MG PO TABS: 2.5000 mg | ORAL_TABLET | Freq: Four times a day (QID) | ORAL | Status: DC | PRN

## 2013-11-27 NOTE — Discharge Summary (Signed)
PATIENT ID:      Melissa [name]  MRN:     [id number] DOB/AGE:    Jan 11, 1929 / 80 y.o.     DISCHARGE SUMMARY  ADMISSION DATE:    11/26/2013 DISCHARGE DATE:    ADMISSION DIAGNOSIS: RIGHT SHOULDER ROTATOR CUFF TEAR ARTHROPATHY Past Medical History  Diagnosis Date  . PONV (postoperative nausea and vomiting)   . Heart murmur     echo done in 2015  . Headache(784.0)     migraine last one 11-17-13  . Arthritis   . Joint pain   . GERD (gastroesophageal reflux disease)     takes OMeprazole daily  . History of blood transfusion     no abnormal reaction noted     DISCHARGE DIAGNOSIS:   Active Problems:   S/P shoulder replacement   PROCEDURE: Procedure(s): RIGHT REVERSE SHOULDER ARTHROPLASTY on 11/26/2013  CONSULTS:   none  HISTORY:  See H&P in chart.  HOSPITAL COURSE:  Melissa [name] is a 80 y.o. admitted on 11/26/2013 with a chief complaint of right shoulder pain and dysfunction, and found to have a diagnosis of [place].  They were brought to the operating room on 11/26/2013 and underwent Procedure(s): RIGHT REVERSE SHOULDER ARTHROPLASTY.    They were given perioperative antibiotics: Anti-infectives   Start     Dose/Rate Route Frequency Ordered Stop   11/26/13 1600  ceFAZolin (ANCEF) IVPB 1 g/50 mL premix     1 g 100 mL/hr over 30 Minutes Intravenous Every 6 hours 11/26/13 1526 11/27/13 0959   11/26/13 0600  ceFAZolin (ANCEF) IVPB 2 g/50 mL premix     2 g 100 mL/hr over 30 Minutes Intravenous On call to O.R. 11/25/13 1419 11/26/13 0752    .  Patient underwent the above named procedure and tolerated it well. The following day they were hemodynamically stable and pain was controlled on oral analgesics. They were neurovascularly intact to the operative extremity. OT was ordered and worked with patient per protocol. They were medically and orthopaedically stable for discharge on day 1.    DIAGNOSTIC STUDIES:  RECENT RADIOGRAPHIC STUDIES :  No  results found.  RECENT VITAL SIGNS:  Patient Vitals for the past 24 hrs:  BP Temp Temp src Pulse Resp SpO2  11/27/13 0642 100/49 mmHg 97.7 F (36.5 C) Oral 76 17 98 %  11/26/13 2004 96/81 mmHg 98.2 F (36.8 C) Oral 63 18 100 %  11/26/13 1315 114/37 mmHg - - 67 17 95 %  11/26/13 1232 119/46 mmHg - - 69 18 94 %  11/26/13 1132 120/42 mmHg - - 69 17 99 %  11/26/13 1130 - - - 66 16 99 %  11/26/13 1102 128/50 mmHg - - 67 15 99 %  11/26/13 1030 134/57 mmHg 97.5 F (36.4 C) - 70 14 98 %  11/26/13 1015 121/59 mmHg - - 71 13 98 %  11/26/13 1000 135/54 mmHg - - 76 16 98 %  11/26/13 0945 130/54 mmHg - - 77 15 98 %  11/26/13 0930 - - - 83 16 98 %  11/26/13 0929 139/60 mmHg 97.3 F (36.3 C) - 81 16 97 %  .  RECENT EKG RESULTS:    Orders placed during the hospital encounter of 11/18/13  . EKG 12-LEAD  . EKG 12-LEAD    DISCHARGE INSTRUCTIONS:    DISCHARGE MEDICATIONS:     Medication List         diazepam 5 MG tablet  Commonly known  as:  VALIUM  Take 0.5-1 tablets (2.5-5 mg total) by mouth every 6 (six) hours as needed for muscle spasms or sedation.     ibuprofen 200 MG tablet  Commonly known as:  ADVIL,MOTRIN  Take 400-600 mg by mouth every 6 (six) hours as needed (for pain).     multivitamin with minerals Tabs tablet  Take 1 tablet by mouth daily.     omeprazole 20 MG tablet  Commonly known as:  PRILOSEC OTC  Take 20 mg by mouth daily.     oxyCODONE-acetaminophen 5-325 MG per tablet  Commonly known as:  PERCOCET  Take 1-2 tablets by mouth every 4 (four) hours as needed (you may supplement with your otc advil).        FOLLOW UP VISIT:       Follow-up Information   Follow up with Marin Shutter, MD. (call to be seen in 10-14 days)    Specialty:  Orthopedic Surgery   Contact information:   [address] [place] [address] [phone]       DISCHARGE TO: home  DISPOSITION: good  DISCHARGE CONDITION:  Festus Barren for Dr. Justice Britain 11/27/2013, 8:06 AM

## 2013-11-27 NOTE — Evaluation (Signed)
Physical Therapy Evaluation Patient Details Name: LONDAN COPLEN MRN: [id number] DOB: 1928/04/12 Today's Date: 11/27/2013   History of Present Illness  pt presents post R Reverse Shoulder.    Clinical Impression  Pt generally moves well, but gets distracted easily causing difficulties with balance.  Spoke with pt and husband about cane use at home and continue therapy to work on balance.  Pt and husband indicate they can receive therapy from staff at [facility].  Will defer further PT to [facility].  Will sign off.      Follow Up Recommendations Home health PT;Supervision/Assistance - 24 hour    Equipment Recommendations  None recommended by PT    Recommendations for Other Services       Precautions / Restrictions Precautions Precautions: Shoulder Type of Shoulder Precautions: Precautions per Dr Onnie Graham Shoulder Interventions: Shoulder sling/immobilizer (pt ed to use at alltimes as pt using UE when not in sling.  ) Precaution Comments: OT instructed in precautions. Required Braces or Orthoses: Sling Restrictions Weight Bearing Restrictions: Yes RUE Weight Bearing: Non weight bearing      Mobility  Bed Mobility               General bed mobility comments: pt sitting in recliner  Transfers Overall transfer level: Needs assistance Equipment used: None Transfers: Sit to/from Stand Sit to Stand: Supervision         General transfer comment: pt mildly unsteady, but uses L UE appropriately.    Ambulation/Gait Ambulation/Gait assistance: Min assist Ambulation Distance (Feet): 200 Feet Assistive device: Straight cane Gait Pattern/deviations: Step-through pattern     General Gait Details: pt generally S level, but with 3 LOB, 1 requiring MinA to prevent fall.  pt seems to get distracted easily causing difficulties with balance.    Stairs            Wheelchair Mobility    Modified Rankin (Stroke Patients Only)       Balance                                              Pertinent Vitals/Pain Pain Assessment: No/denies pain    Home Living Family/patient expects to be discharged to:: Private residence Living Arrangements: Spouse/significant other Available Help at Discharge: Family;Available 24 hours/day Type of Home: Apartment Home Access: Level entry     Home Layout: One level Home Equipment: Cane - single point Additional Comments: pt and spouse live at [place].      Prior Function Level of Independence: Independent               Hand Dominance        Extremity/Trunk Assessment   Upper Extremity Assessment: Defer to OT evaluation           Lower Extremity Assessment: Overall WFL for tasks assessed      Cervical / Trunk Assessment: Normal  Communication   Communication: No difficulties  Cognition Arousal/Alertness: Awake/alert Behavior During Therapy: WFL for tasks assessed/performed Overall Cognitive Status: Within Functional Limits for tasks assessed                      General Comments      Exercises        Assessment/Plan    PT Assessment All further PT needs can be met in the next venue of care  PT Diagnosis Difficulty walking   PT Problem List    PT Treatment Interventions     PT Goals (Current goals can be found in the Care Plan section) Acute Rehab PT Goals PT Goal Formulation: No goals set, d/c therapy    Frequency     Barriers to discharge        Co-evaluation               End of Session Equipment Utilized During Treatment: Gait belt Activity Tolerance: Patient tolerated treatment well Patient left: in chair;with call bell/phone within reach;with family/visitor present Nurse Communication:  (D/C needs)         Time: 1115-1130 PT Time Calculation (min): 15 min   Charges:   PT Evaluation $Initial PT Evaluation Tier I: 1 Procedure PT Treatments $Gait Training: 8-22 mins   PT G CodesCatarina Hartshorn, Gantt 11/27/2013, 12:03 PM

## 2013-11-27 NOTE — Progress Notes (Signed)
Occupational Therapy Evaluation Patient Details Name: Melissa [name] MRN: [id number] DOB: 02/02/1929 Today's Date: 11/27/2013    History of Present Illness pt presents post R Reverse Shoulder.     Clinical Impression   PTA pt lived at home (ILF) with husband and was independent with ADLs. Pt currently limited by RUE precautions and immobilization which impede independence with ADLs. Pt also having difficulty recalling precautions and using RUE for functional tasks and transfers, so encouraged pt to wear sling at all times (off for ADLs/exercise) to limit AROM. Pt plans d/c today and will progress rehab of shoulder as directed. Pt will benefit from [place].     Follow Up Recommendations  Home health OT;Supervision/Assistance - 24 hour    Equipment Recommendations  None recommended by OT    Recommendations for Other Services       Precautions / Restrictions Precautions Precautions: Shoulder Type of Shoulder Precautions: Supple reverse protocol (AAROM shoulder exercises for ER, ABD, FF); pendulums, e/w/h Shoulder Interventions: Shoulder sling/immobilizer (encouraged pt to wear at all times due to tendency to move U) Precaution Booklet Issued: Yes (comment) Precaution Comments: Educated pt in shoulder precautions and incorporating into ADLs.  Required Braces or Orthoses: Sling Restrictions Weight Bearing Restrictions: Yes RUE Weight Bearing: Non weight bearing      Mobility Bed Mobility               General bed mobility comments: pt sitting in recliner  Transfers Overall transfer level: Needs assistance Equipment used: None Transfers: Sit to/from Stand Sit to Stand: Min guard         General transfer comment: Pt mildly unsteady and attempting to use RUE to assist with transfer despite prior explaination of NWB and VC's not to use RUE.           ADL Overall ADL's : Needs assistance/impaired Eating/Feeding: Set up;Sitting Eating/Feeding Details (indicate cue type  and reason): requires assist for opening containers and cutting food Grooming: Standing;Min guard (for balance and RUE precautions)   Upper Body Bathing: Moderate assistance;Sitting   Lower Body Bathing: Minimal assistance;Sit to/from stand   Upper Body Dressing : Maximal assistance;Sitting (including sling)   Lower Body Dressing: Moderate assistance;Sit to/from stand   Toilet Transfer: Min guard;Ambulation   Toileting- Clothing Manipulation and Hygiene: Min guard;Sit to/from stand       Functional mobility during ADLs: Min guard General ADL Comments: Pt with significant difficulty recalling precautions and following directions for compensatory techniques and UE exercises.      Vision  Pt wears glasses and reports no change from baseline.                    Perception Perception Perception Tested?: No   Praxis Praxis Praxis tested?: Within functional limits    Pertinent Vitals/Pain Pain Assessment: No/denies pain     Hand Dominance Right   Extremity/Trunk Assessment Upper Extremity Assessment Upper Extremity Assessment: RUE deficits/detail RUE Deficits / Details: RUE shoulder precautions, however pt having difficulty with keeping RUE still and not using it functionally RUE: Unable to fully assess due to immobilization RUE Coordination: decreased gross motor   Lower Extremity Assessment Lower Extremity Assessment: Defer to PT evaluation   Cervical / Trunk Assessment Cervical / Trunk Assessment: Normal   Communication Communication Communication: No difficulties   Cognition Arousal/Alertness: Awake/alert Behavior During Therapy: WFL for tasks assessed/performed Overall Cognitive Status: Within Functional Limits for tasks assessed       Memory: Decreased short-term memory;Decreased recall of precautions  Home Living Family/patient expects to be discharged to:: Private residence Living Arrangements: Spouse/significant  other Available Help at Discharge: Family;Available 24 hours/day Type of Home: Apartment Home Access: Level entry     Home Layout: One level     Bathroom Shower/Tub: Walk-in shower         Home Equipment: Cane - single point;Shower seat   Additional Comments: pt and spouse live at [facility]        Prior Functioning/Environment Level of Independence: Independent             OT Diagnosis: Generalized weakness;Acute pain   OT Problem List: Decreased strength;Decreased range of motion;Impaired balance (sitting and/or standing);Decreased safety awareness;Decreased knowledge of use of DME or AE;Decreased knowledge of precautions;Impaired UE functional use;Pain                       End of Session Equipment Utilized During Treatment: Gait belt;Other (comment) (sling) Nurse Communication: Other (comment) (pt will require [place] at d/c)  Activity Tolerance: Patient tolerated treatment well Patient left: in chair;with call bell/phone within reach;with family/visitor present   Time: 2641-5830 OT Time Calculation (min): 85 min Charges:  OT General Charges $OT Visit: 1 Procedure OT Evaluation $Initial OT Evaluation Tier I: 1 Procedure OT Treatments $Self Care/Home Management : 23-37 mins $Therapeutic Activity: 8-22 mins $Therapeutic Exercise: 23-37 mins  Villa Herb M 11/27/2013, 1:26 PM  Cyndie Chime, OTR/L Occupational Therapist [phone] (pager)

## 2013-11-27 NOTE — Care Management Note (Signed)
CARE MANAGEMENT NOTE 11/27/2013  Patient:  Melissa [name], Melissa [name]   Account Number:  [id number]  Date Initiated:  11/27/2013  Documentation initiated by:  Ricki Miller  Subjective/Objective Assessment:   80 yr old female admitted with right shoulder rotator cuff tear, patient underwent right reverse shoulder arthroplasty.     Action/Plan:   Case manager faxed home health orders to [place], physical therapist at [facility]. Fax :[phone] , PH:[phone]   Anticipated DC Date:  11/27/2013   Anticipated DC Plan:  [place]  In-house referral  NA      DC Planning Services  CM consult      [facility]  HOME HEALTH   Choice offered to / List presented to:  C-3 Spouse   DME arranged  NA        [place] arranged  HH-2 PT  HH-3 OT      [place] agency  OTHER - SEE NOTE   Status of service:  Completed, signed off Medicare Important Message given?  NA - LOS <3 / Initial given by admissions (If response is "NO", the following Medicare IM given date fields will be blank) Date Medicare IM given:   Medicare IM given by:   Date Additional Medicare IM given:   Additional Medicare IM given by:    Discharge Disposition:  [place]  Per UR Regulation:  Reviewed for med. necessity/level of care/duration of stay  If discussed at [place] of Stay Meetings, dates discussed:    Comments:

## 2013-12-01 DIAGNOSIS — Z471 Aftercare following joint replacement surgery: Secondary | ICD-10-CM | POA: Diagnosis not present

## 2013-12-01 DIAGNOSIS — Z96611 Presence of right artificial shoulder joint: Secondary | ICD-10-CM | POA: Diagnosis not present

## 2013-12-01 DIAGNOSIS — Z96653 Presence of artificial knee joint, bilateral: Secondary | ICD-10-CM | POA: Diagnosis not present

## 2013-12-01 DIAGNOSIS — Z9181 History of falling: Secondary | ICD-10-CM | POA: Diagnosis not present

## 2013-12-01 DIAGNOSIS — R269 Unspecified abnormalities of gait and mobility: Secondary | ICD-10-CM | POA: Diagnosis not present

## 2013-12-10 DIAGNOSIS — Z23 Encounter for immunization: Secondary | ICD-10-CM | POA: Diagnosis not present

## 2013-12-11 DIAGNOSIS — Z471 Aftercare following joint replacement surgery: Secondary | ICD-10-CM | POA: Diagnosis not present

## 2013-12-11 DIAGNOSIS — Z96611 Presence of right artificial shoulder joint: Secondary | ICD-10-CM | POA: Diagnosis not present

## 2013-12-14 DIAGNOSIS — M6281 Muscle weakness (generalized): Secondary | ICD-10-CM | POA: Diagnosis not present

## 2013-12-14 DIAGNOSIS — Z471 Aftercare following joint replacement surgery: Secondary | ICD-10-CM | POA: Diagnosis not present

## 2013-12-15 DIAGNOSIS — Z471 Aftercare following joint replacement surgery: Secondary | ICD-10-CM | POA: Diagnosis not present

## 2013-12-15 DIAGNOSIS — M6281 Muscle weakness (generalized): Secondary | ICD-10-CM | POA: Diagnosis not present

## 2013-12-17 DIAGNOSIS — M6281 Muscle weakness (generalized): Secondary | ICD-10-CM | POA: Diagnosis not present

## 2013-12-17 DIAGNOSIS — Z471 Aftercare following joint replacement surgery: Secondary | ICD-10-CM | POA: Diagnosis not present

## 2013-12-21 DIAGNOSIS — M6281 Muscle weakness (generalized): Secondary | ICD-10-CM | POA: Diagnosis not present

## 2013-12-23 DIAGNOSIS — M6281 Muscle weakness (generalized): Secondary | ICD-10-CM | POA: Diagnosis not present

## 2013-12-25 DIAGNOSIS — M6281 Muscle weakness (generalized): Secondary | ICD-10-CM | POA: Diagnosis not present

## 2013-12-27 DIAGNOSIS — M6281 Muscle weakness (generalized): Secondary | ICD-10-CM | POA: Diagnosis not present

## 2013-12-28 DIAGNOSIS — M6281 Muscle weakness (generalized): Secondary | ICD-10-CM | POA: Diagnosis not present

## 2013-12-29 DIAGNOSIS — L853 Xerosis cutis: Secondary | ICD-10-CM | POA: Diagnosis not present

## 2013-12-29 DIAGNOSIS — L308 Other specified dermatitis: Secondary | ICD-10-CM | POA: Diagnosis not present

## 2013-12-29 DIAGNOSIS — L821 Other seborrheic keratosis: Secondary | ICD-10-CM | POA: Diagnosis not present

## 2013-12-29 DIAGNOSIS — L570 Actinic keratosis: Secondary | ICD-10-CM | POA: Diagnosis not present

## 2013-12-29 DIAGNOSIS — Z85828 Personal history of other malignant neoplasm of skin: Secondary | ICD-10-CM | POA: Diagnosis not present

## 2013-12-30 DIAGNOSIS — M6281 Muscle weakness (generalized): Secondary | ICD-10-CM | POA: Diagnosis not present

## 2014-01-01 DIAGNOSIS — M6281 Muscle weakness (generalized): Secondary | ICD-10-CM | POA: Diagnosis not present

## 2014-01-04 DIAGNOSIS — M6281 Muscle weakness (generalized): Secondary | ICD-10-CM | POA: Diagnosis not present

## 2014-01-07 DIAGNOSIS — M6281 Muscle weakness (generalized): Secondary | ICD-10-CM | POA: Diagnosis not present

## 2014-01-11 DIAGNOSIS — M6281 Muscle weakness (generalized): Secondary | ICD-10-CM | POA: Diagnosis not present

## 2014-01-13 DIAGNOSIS — M6281 Muscle weakness (generalized): Secondary | ICD-10-CM | POA: Diagnosis not present

## 2014-01-18 DIAGNOSIS — M6281 Muscle weakness (generalized): Secondary | ICD-10-CM | POA: Diagnosis not present

## 2014-02-15 DIAGNOSIS — Z471 Aftercare following joint replacement surgery: Secondary | ICD-10-CM | POA: Diagnosis not present

## 2014-02-15 DIAGNOSIS — Z96611 Presence of right artificial shoulder joint: Secondary | ICD-10-CM | POA: Diagnosis not present

## 2014-02-22 DIAGNOSIS — M25511 Pain in right shoulder: Secondary | ICD-10-CM | POA: Diagnosis not present

## 2014-02-22 DIAGNOSIS — Z0000 Encounter for general adult medical examination without abnormal findings: Secondary | ICD-10-CM | POA: Diagnosis not present

## 2014-02-22 DIAGNOSIS — Z1389 Encounter for screening for other disorder: Secondary | ICD-10-CM | POA: Diagnosis not present

## 2014-02-22 DIAGNOSIS — I351 Nonrheumatic aortic (valve) insufficiency: Secondary | ICD-10-CM | POA: Diagnosis not present

## 2014-02-22 DIAGNOSIS — R5383 Other fatigue: Secondary | ICD-10-CM | POA: Diagnosis not present

## 2014-03-17 DIAGNOSIS — Z471 Aftercare following joint replacement surgery: Secondary | ICD-10-CM | POA: Diagnosis not present

## 2014-03-17 DIAGNOSIS — Z96611 Presence of right artificial shoulder joint: Secondary | ICD-10-CM | POA: Diagnosis not present

## 2014-07-27 DIAGNOSIS — R350 Frequency of micturition: Secondary | ICD-10-CM | POA: Diagnosis not present

## 2014-08-16 DIAGNOSIS — L812 Freckles: Secondary | ICD-10-CM | POA: Diagnosis not present

## 2014-08-16 DIAGNOSIS — L578 Other skin changes due to chronic exposure to nonionizing radiation: Secondary | ICD-10-CM | POA: Diagnosis not present

## 2014-08-16 DIAGNOSIS — L821 Other seborrheic keratosis: Secondary | ICD-10-CM | POA: Diagnosis not present

## 2014-08-16 DIAGNOSIS — L820 Inflamed seborrheic keratosis: Secondary | ICD-10-CM | POA: Diagnosis not present

## 2014-08-16 DIAGNOSIS — Z85828 Personal history of other malignant neoplasm of skin: Secondary | ICD-10-CM | POA: Diagnosis not present

## 2014-08-19 DIAGNOSIS — H35372 Puckering of macula, left eye: Secondary | ICD-10-CM | POA: Diagnosis not present

## 2014-08-19 DIAGNOSIS — H2640 Unspecified secondary cataract: Secondary | ICD-10-CM | POA: Diagnosis not present

## 2014-08-26 DIAGNOSIS — Z1231 Encounter for screening mammogram for malignant neoplasm of breast: Secondary | ICD-10-CM | POA: Diagnosis not present

## 2014-08-26 DIAGNOSIS — Z6823 Body mass index (BMI) 23.0-23.9, adult: Secondary | ICD-10-CM | POA: Diagnosis not present

## 2014-08-26 DIAGNOSIS — Z01419 Encounter for gynecological examination (general) (routine) without abnormal findings: Secondary | ICD-10-CM | POA: Diagnosis not present

## 2014-10-19 DIAGNOSIS — D485 Neoplasm of uncertain behavior of skin: Secondary | ICD-10-CM | POA: Diagnosis not present

## 2014-10-19 DIAGNOSIS — L570 Actinic keratosis: Secondary | ICD-10-CM | POA: Diagnosis not present

## 2014-10-19 DIAGNOSIS — Z85828 Personal history of other malignant neoplasm of skin: Secondary | ICD-10-CM | POA: Diagnosis not present

## 2014-11-10 DIAGNOSIS — Z96611 Presence of right artificial shoulder joint: Secondary | ICD-10-CM | POA: Diagnosis not present

## 2014-11-10 DIAGNOSIS — Z471 Aftercare following joint replacement surgery: Secondary | ICD-10-CM | POA: Diagnosis not present

## 2014-12-16 DIAGNOSIS — Z23 Encounter for immunization: Secondary | ICD-10-CM | POA: Diagnosis not present

## 2015-04-11 DIAGNOSIS — M810 Age-related osteoporosis without current pathological fracture: Secondary | ICD-10-CM | POA: Diagnosis not present

## 2015-04-11 DIAGNOSIS — K219 Gastro-esophageal reflux disease without esophagitis: Secondary | ICD-10-CM | POA: Diagnosis not present

## 2015-04-11 DIAGNOSIS — Z79899 Other long term (current) drug therapy: Secondary | ICD-10-CM | POA: Diagnosis not present

## 2015-04-11 DIAGNOSIS — I351 Nonrheumatic aortic (valve) insufficiency: Secondary | ICD-10-CM | POA: Diagnosis not present

## 2015-04-11 DIAGNOSIS — Z1389 Encounter for screening for other disorder: Secondary | ICD-10-CM | POA: Diagnosis not present

## 2015-04-11 DIAGNOSIS — Z0000 Encounter for general adult medical examination without abnormal findings: Secondary | ICD-10-CM | POA: Diagnosis not present

## 2015-04-11 DIAGNOSIS — D649 Anemia, unspecified: Secondary | ICD-10-CM | POA: Diagnosis not present

## 2015-05-25 DIAGNOSIS — D649 Anemia, unspecified: Secondary | ICD-10-CM | POA: Diagnosis not present

## 2015-05-31 DIAGNOSIS — R0789 Other chest pain: Secondary | ICD-10-CM | POA: Diagnosis not present

## 2015-05-31 DIAGNOSIS — D508 Other iron deficiency anemias: Secondary | ICD-10-CM | POA: Diagnosis not present

## 2015-05-31 DIAGNOSIS — K219 Gastro-esophageal reflux disease without esophagitis: Secondary | ICD-10-CM | POA: Diagnosis not present

## 2015-06-08 ENCOUNTER — Other Ambulatory Visit: Payer: Self-pay

## 2015-06-08 ENCOUNTER — Ambulatory Visit (INDEPENDENT_AMBULATORY_CARE_PROVIDER_SITE_OTHER): Payer: Medicare Other | Admitting: Nurse Practitioner

## 2015-06-08 ENCOUNTER — Encounter: Payer: Self-pay | Admitting: Nurse Practitioner

## 2015-06-08 ENCOUNTER — Ambulatory Visit: Payer: Medicare Other

## 2015-06-08 ENCOUNTER — Other Ambulatory Visit (INDEPENDENT_AMBULATORY_CARE_PROVIDER_SITE_OTHER): Payer: Medicare Other

## 2015-06-08 VITALS — BP 124/62 | HR 64 | Ht 62.0 in | Wt 123.8 lb

## 2015-06-08 DIAGNOSIS — R011 Cardiac murmur, unspecified: Secondary | ICD-10-CM | POA: Diagnosis not present

## 2015-06-08 DIAGNOSIS — R0609 Other forms of dyspnea: Secondary | ICD-10-CM

## 2015-06-08 DIAGNOSIS — R079 Chest pain, unspecified: Secondary | ICD-10-CM | POA: Diagnosis not present

## 2015-06-08 DIAGNOSIS — R072 Precordial pain: Secondary | ICD-10-CM

## 2015-06-08 DIAGNOSIS — R0789 Other chest pain: Secondary | ICD-10-CM

## 2015-06-08 NOTE — Progress Notes (Signed)
Cardiology Clinic Note   Patient Name: Melissa [name] Date of Encounter: 06/08/2015  Primary Care Provider:  Mathews Argyle, MD Primary Cardiologist:  New  Patient Profile    82 y/o ?without a prior cardiac history who presents for evaluation related to recent bouts of chest pain.  Past Medical History    Past Medical History  Diagnosis Date  . Heart murmur     a. 05/2013 Echo: EF 55-60%, gr2 DD, mild AI/MR.  Marland Kitchen History of migraine headaches     a. last 10/2013  . Arthritis   . Joint pain   . GERD (gastroesophageal reflux disease)     a. takes Omeprazole daily, prev seen by D. Brodie.  Marland Kitchen History of blood transfusion     no abnormal reaction noted   . PONV (postoperative nausea and vomiting)   . Iron deficiency anemia   . Rectocele 2011  . Chest pain     a. 05/2015 - post-prandial.   Past Surgical History  Procedure Laterality Date  . Tonsillectomy    . Thyroidectomy  at age 81  . Joint replacement Bilateral R-9/04, L- 8/05    Total Knee Arthroplasties.  . Shoulder arthroscopy Right   . Colonoscopy    . Cataract surgery  Bilateral     R 03/2001; L 05/2001.  Marland Kitchen Reverse shoulder arthroplasty Right 11/26/2013    Procedure: RIGHT REVERSE SHOULDER ARTHROPLASTY;  Surgeon: Marin Shutter, MD;  Location: [facility];  Service: Orthopedics;  Laterality: Right;  . Hysteroscopy  11/1999    w/ resection of endometrial polyp by uterine curetting.    Allergies  Allergies  Allergen Reactions  . Sulfonamide Derivatives Hives    History of Present Illness    82 year old female with a history of iron deficiency anemia, GERD, arthritis, and systolic murmur with prior finding of mild mitral regurgitation and aortic insufficiency on echocardiogram in 2015. She is married and she and her husband live locally at [facility], where they are both very active. She walks 1 mile daily and twice a week, participates in water aerobics.  She has never smoked cigarettes and does not habitually use  alcohol.  She does have a family history of premature coronary artery disease with 2 brothers and her mother having heart attacks at a relatively young age.  She was in her usual state of health until approximately 2 weeks ago when she began to experience almost daily, moderate chest tightness and pressure occurring almost exclusively after her midday meal, which is her largest meal of the day. She describes the discomfort as feeling as though there is a bubble in her chest and if she could just belch, it would go away. She has not had any associated dyspnea, diaphoresis, radiation, nausea, or vomiting. Generally when this occurs, she will either take a Tums or a spoonful of baking soda, which helps her to belch, and symptoms resolve within 30 minutes. Over the same period of time, she has noticed that during her 1 mile daily walk, she has been more short of breath than usual. She has not had chest pressure or tightness while walking however. The dyspnea she expresses during her walk does not stop her from exercising and resolves once she completes her 1 mile walk. She was recently seen by her primary care provider related to the symptoms and advised to discontinue iron as it was felt to be causing constipation and she was also placed on Prilosec. She says that over the past 3  days she has not had any chest tightness and that also her dyspnea on exertion has resolved.  She denies any prior history of PND, orthopnea, dizziness, syncope, edema, or early satiety.  Home Medications    Prior to Admission medications   Medication Sig Start Date End Date Taking? Authorizing Provider  aspirin 81 MG EC tablet Take 81 mg by mouth daily. 05/31/15  Yes Historical Provider, MD  cholecalciferol (VITAMIN D) 1000 units tablet Take 1,000 Units by mouth daily.   Yes Historical Provider, MD  ibuprofen (ADVIL,MOTRIN) 200 MG tablet Take 400-600 mg by mouth every 6 (six) hours as needed (for pain).   Yes Historical Provider,  MD  Multiple Vitamin (MULTIVITAMIN WITH MINERALS) TABS tablet Take 1 tablet by mouth daily.   Yes Historical Provider, MD  omeprazole (PRILOSEC OTC) 20 MG tablet Take 40 mg by mouth daily.    Yes Historical Provider, MD    Family History    Family History  Problem Relation Age of Onset  . Heart attack Mother     died @ 61  . Heart attack Brother     died @ 4  . Heart attack Brother     died @ 38  . Other Father     died suddenly @ 1.    Social History    Social History   Social History  . Marital Status: Married    Spouse Name: N/A  . Number of Children: N/A  . Years of Education: N/A   Occupational History  . Not on file.   Social History Main Topics  . Smoking status: Never Smoker   . Smokeless tobacco: Not on file  . Alcohol Use: No  . Drug Use: No  . Sexual Activity: No   Other Topics Concern  . Not on file   Social History Narrative   Retired.  Very active.  Lives at [facility] with 41 y/o husband.  Walks one mile daily (~ 40 mins) and partakes in H2O aerobics twice/wk.  Enjoys gardening.     Review of Systems    General:  No chills, fever, night sweats or weight changes.  Cardiovascular:  +++ Midsternal postprandial chest pain over the past 2 weeks. She has also noted dyspnea on exertion over the same time period. No edema, orthopnea, palpitations, paroxysmal nocturnal dyspnea. Dermatological: No rash, lesions/masses Respiratory: No cough, positive dyspnea on exertion. Urologic: No hematuria, dysuria Abdominal:   No nausea, vomiting, diarrhea, bright red blood per rectum, melena, or hematemesis Neurologic:  No visual changes, wkns, changes in mental status. All other systems reviewed and are otherwise negative except as noted above.  Physical Exam    VS:  BP 124/62 mmHg  Pulse 64  Ht 5\' 2"  (1.575 m)  Wt 123 lb 12.8 oz (56.155 kg)  BMI 22.64 kg/m2 , BMI Body mass index is 22.64 kg/(m^2). GEN: Well nourished, well developed, in no acute  distress. HEENT: normal. Neck: Supple, no JVD, carotid bruits, or masses. Cardiac: RRR, no rubs, or gallops. 2/6 SEM LLSB/Apex. No clubbing, cyanosis, edema.  Radials/DP/PT 2+ and equal bilaterally.  Respiratory:  Respirations regular and unlabored, clear to auscultation bilaterally. GI: Soft, nontender, nondistended, BS + x 4. MS: no deformity or atrophy. Skin: warm and dry, no rash. Neuro:  Strength and sensation are intact. Psych: Normal affect.  Accessory Clinical Findings    ECG - RSR, 64, delayed R progression, no acute S/T changes - similar to her ECG from 4/11 @ PCP office.  Assessment & Plan   1.  Midsternal Chest Pain and dyspnea on exertion:  Patient presents today for evaluation of midsternal chest pain that has been occurring over the past 2 weeks. She has also been experiencing dyspnea on exertion, which is a new symptom for her. Her chest pain appears to be exclusively occurring in the postprandial state, generally following her largest meal of the day, which is lunch. It is described as a bubble like sensation under her sternum and also a sensation of needing to belch. Symptoms appear to resolve with either taking a Tums or a spoonful of baking soda followed by belching. Chest pain symptoms seem to have improved since being initiated on Prilosec therapy. She is very active and walks 1 mile each day and has not experienced this type of chest pain while walking, however has had dyspnea on exertion. She has no prior history of hypertension, hyperlipidemia, diabetes, or stroke. She does have a family history of premature coronary artery disease. Though I suspect her chest pain is likely GI in origin, I don't think that explains her dyspnea on exertion.  In the setting of new symptoms and family history, I will arrange for an exercise Myoview to rule out ischemia. Of note, she has recently had blood work including CBC, which she reports as being stable and troponin, which was  normal.  2. Systolic murmur: Patient has a history of systolic murmur with mild aortic insufficiency and mitral regurgitation noted on prior echocardiogram in 2015. In light of new symptoms of dyspnea on exertion, I will repeat an echocardiogram.   3. GERD: Likely contributing to #1 as noted above. She remains on Prilosec with some improvement in symptoms.  4.  Iron Deficiency anemia:  She reports blood work earlier this year that showed stability of H/H.  She was recently taken off of Iron 2/2 constipation.  5. Disposition: Follow-up stress testing and echo. Provided that testing is normal, she may follow-up in one year or as needed.  Murray Hodgkins, NP 06/08/2015, 11:26 AM

## 2015-06-08 NOTE — Patient Instructions (Signed)
Medication Instructions:  Your physician recommends that you continue on your current medications as directed. Please refer to the Current Medication list given to you today.   Labwork: NONE  Testing/Procedures: 1. Your physician has requested that you have en exercise stress myoview. For further information please visit [email or web address]. Please follow instruction sheet, as given.  [place]  Your caregiver has ordered a Stress Test with nuclear imaging. The purpose of this test is to evaluate the blood supply to your heart muscle. This procedure is referred to as a "Non-Invasive Stress Test." This is because other than having an IV started in your vein, nothing is inserted or "invades" your body. Cardiac stress tests are done to find areas of poor blood flow to the heart by determining the extent of coronary artery disease (CAD). Some patients exercise on a treadmill, which naturally increases the blood flow to your heart, while others who are  unable to walk on a treadmill due to physical limitations have a pharmacologic/chemical stress agent called Lexiscan . This medicine will mimic walking on a treadmill by temporarily increasing your coronary blood flow.   Please note: these test may take anywhere between 2-4 hours to complete  PLEASE REPORT TO [place] AT THE FIRST DESK WILL DIRECT YOU WHERE TO GO  Date of Procedure:_____________________________________  Arrival Time for Procedure:______________________________  Instructions regarding medication:   ____ : Hold diabetes medication morning of procedure  ____:  Hold betablocker(s) night before procedure and morning of procedure  ____:  Hold other medications as  follows:_________________________________________________________________________________________________________________________________________________________________________________________________________________________________________________________________________________________  PLEASE NOTIFY THE OFFICE AT LEAST 24 HOURS IN ADVANCE IF YOU ARE UNABLE TO KEEP YOUR APPOINTMENT.  [phone] AND  PLEASE NOTIFY NUCLEAR MEDICINE AT [facility] AT LEAST 24 HOURS IN ADVANCE IF YOU ARE UNABLE TO KEEP YOUR APPOINTMENT. [phone]  How to prepare for your Myoview test:  1. Do not eat or drink after midnight 2. No caffeine for 24 hours prior to test 3. No smoking 24 hours prior to test. 4. Your medication may be taken with water.  If your doctor stopped a medication because of this test, do not take that medication. 5. Ladies, please do not wear dresses.  Skirts or pants are appropriate. Please wear a short sleeve shirt. 6. No perfume, cologne or lotion. 7. Wear comfortable walking shoes. No heels!   2. Your physician has requested that you have an echocardiogram. Echocardiography is a painless test that uses sound waves to create images of your heart. It provides your doctor with information about the size and shape of your heart and how well your heart's chambers and valves are working. This procedure takes approximately one hour. There are no restrictions for this procedure. PER CHRIS BERG, NP SEE IF THIS COULD BE DONE TODAY   Follow-Up: Your physician wants you to follow-up in: [place].  You will receive a reminder letter in the mail two months in advance. If you don't receive a letter, please call our office to schedule the follow-up appointment.   Any Other Special Instructions Will Be Listed Below (If Applicable).     If you need a refill on your cardiac medications before your next appointment, please call your pharmacy.

## 2015-06-09 LAB — ECHOCARDIOGRAM COMPLETE
HEIGHTINCHES: 62 in
Weight: 1980.8 oz

## 2015-06-10 ENCOUNTER — Ambulatory Visit
Admission: RE | Admit: 2015-06-10 | Discharge: 2015-06-10 | Disposition: A | Discharge status: Home / Self Care | Payer: Medicare Other | Source: Ambulatory Visit | Attending: Nurse Practitioner | Admitting: Nurse Practitioner

## 2015-06-10 ENCOUNTER — Telehealth: Payer: Self-pay | Admitting: Nurse Practitioner

## 2015-06-10 DIAGNOSIS — R072 Precordial pain: Secondary | ICD-10-CM | POA: Diagnosis not present

## 2015-06-10 DIAGNOSIS — R0789 Other chest pain: Secondary | ICD-10-CM

## 2015-06-10 LAB — NM MYOCAR MULTI W/SPECT W/WALL MOTION / EF
CHL CUP MPHR: 136 {beats}/min
CHL CUP NUCLEAR SDS: 0
CHL CUP NUCLEAR SRS: 1
CHL CUP NUCLEAR SSS: 0
CSEPHR: 86 %
Estimated workload: 4.6 METS
Exercise duration (min): 4 min
Exercise duration (sec): 1 s
LV sys vol: 33 mL
LVDIAVOL: 65 mL (ref 46–106)
NUC STRESS TID: 0.91
Peak HR: 118 {beats}/min
Rest HR: 62 {beats}/min

## 2015-06-10 MED ORDER — TECHNETIUM TC 99M SESTAMIBI - CARDIOLITE
32.1600 | Freq: Once | INTRAVENOUS | Status: AC | PRN
  Administered 2015-06-10: 32.16 via INTRAVENOUS

## 2015-06-10 MED ORDER — TECHNETIUM TC 99M SESTAMIBI - CARDIOLITE
12.5000 | Freq: Once | INTRAVENOUS | Status: AC | PRN
  Administered 2015-06-10: 12.5 via INTRAVENOUS

## 2015-06-10 NOTE — Telephone Encounter (Signed)
Reviewed w/pt. See echo and lexi myoview results.

## 2015-06-10 NOTE — Telephone Encounter (Signed)
Please call back with Stress test results.

## 2015-06-14 DIAGNOSIS — D649 Anemia, unspecified: Secondary | ICD-10-CM | POA: Diagnosis not present

## 2015-06-20 DIAGNOSIS — D485 Neoplasm of uncertain behavior of skin: Secondary | ICD-10-CM | POA: Diagnosis not present

## 2015-06-20 DIAGNOSIS — Z85828 Personal history of other malignant neoplasm of skin: Secondary | ICD-10-CM | POA: Diagnosis not present

## 2015-06-20 DIAGNOSIS — D692 Other nonthrombocytopenic purpura: Secondary | ICD-10-CM | POA: Diagnosis not present

## 2015-06-20 DIAGNOSIS — L570 Actinic keratosis: Secondary | ICD-10-CM | POA: Diagnosis not present

## 2015-06-20 DIAGNOSIS — L720 Epidermal cyst: Secondary | ICD-10-CM | POA: Diagnosis not present

## 2015-06-20 DIAGNOSIS — L821 Other seborrheic keratosis: Secondary | ICD-10-CM | POA: Diagnosis not present

## 2015-06-23 DIAGNOSIS — L720 Epidermal cyst: Secondary | ICD-10-CM | POA: Diagnosis not present

## 2015-06-23 DIAGNOSIS — D692 Other nonthrombocytopenic purpura: Secondary | ICD-10-CM | POA: Diagnosis not present

## 2015-06-23 DIAGNOSIS — Z85828 Personal history of other malignant neoplasm of skin: Secondary | ICD-10-CM | POA: Diagnosis not present

## 2015-06-28 DIAGNOSIS — K219 Gastro-esophageal reflux disease without esophagitis: Secondary | ICD-10-CM | POA: Diagnosis not present

## 2015-06-28 DIAGNOSIS — D508 Other iron deficiency anemias: Secondary | ICD-10-CM | POA: Diagnosis not present

## 2015-08-18 DIAGNOSIS — Z85828 Personal history of other malignant neoplasm of skin: Secondary | ICD-10-CM | POA: Diagnosis not present

## 2015-08-18 DIAGNOSIS — L720 Epidermal cyst: Secondary | ICD-10-CM | POA: Diagnosis not present

## 2015-09-20 DIAGNOSIS — Z124 Encounter for screening for malignant neoplasm of cervix: Secondary | ICD-10-CM | POA: Diagnosis not present

## 2015-09-20 DIAGNOSIS — Z6823 Body mass index (BMI) 23.0-23.9, adult: Secondary | ICD-10-CM | POA: Diagnosis not present

## 2015-09-20 DIAGNOSIS — Z1231 Encounter for screening mammogram for malignant neoplasm of breast: Secondary | ICD-10-CM | POA: Diagnosis not present

## 2015-10-25 DIAGNOSIS — D508 Other iron deficiency anemias: Secondary | ICD-10-CM | POA: Diagnosis not present

## 2015-12-15 DIAGNOSIS — Z23 Encounter for immunization: Secondary | ICD-10-CM | POA: Diagnosis not present

## 2016-02-29 DIAGNOSIS — R143 Flatulence: Secondary | ICD-10-CM | POA: Diagnosis not present

## 2016-02-29 DIAGNOSIS — R350 Frequency of micturition: Secondary | ICD-10-CM | POA: Diagnosis not present

## 2016-03-12 DIAGNOSIS — R102 Pelvic and perineal pain: Secondary | ICD-10-CM | POA: Diagnosis not present

## 2016-03-12 DIAGNOSIS — N8189 Other female genital prolapse: Secondary | ICD-10-CM | POA: Diagnosis not present

## 2016-03-14 DIAGNOSIS — H35372 Puckering of macula, left eye: Secondary | ICD-10-CM | POA: Diagnosis not present

## 2016-04-12 DIAGNOSIS — L570 Actinic keratosis: Secondary | ICD-10-CM | POA: Diagnosis not present

## 2016-04-12 DIAGNOSIS — D1801 Hemangioma of skin and subcutaneous tissue: Secondary | ICD-10-CM | POA: Diagnosis not present

## 2016-04-12 DIAGNOSIS — L738 Other specified follicular disorders: Secondary | ICD-10-CM | POA: Diagnosis not present

## 2016-04-12 DIAGNOSIS — L821 Other seborrheic keratosis: Secondary | ICD-10-CM | POA: Diagnosis not present

## 2016-04-12 DIAGNOSIS — Z85828 Personal history of other malignant neoplasm of skin: Secondary | ICD-10-CM | POA: Diagnosis not present

## 2016-04-17 DIAGNOSIS — D508 Other iron deficiency anemias: Secondary | ICD-10-CM | POA: Diagnosis not present

## 2016-04-17 DIAGNOSIS — F5101 Primary insomnia: Secondary | ICD-10-CM | POA: Diagnosis not present

## 2016-04-17 DIAGNOSIS — Z0000 Encounter for general adult medical examination without abnormal findings: Secondary | ICD-10-CM | POA: Diagnosis not present

## 2016-04-17 DIAGNOSIS — Z23 Encounter for immunization: Secondary | ICD-10-CM | POA: Diagnosis not present

## 2016-04-17 DIAGNOSIS — M85859 Other specified disorders of bone density and structure, unspecified thigh: Secondary | ICD-10-CM | POA: Diagnosis not present

## 2016-04-17 DIAGNOSIS — Z1389 Encounter for screening for other disorder: Secondary | ICD-10-CM | POA: Diagnosis not present

## 2016-04-17 DIAGNOSIS — Z79899 Other long term (current) drug therapy: Secondary | ICD-10-CM | POA: Diagnosis not present

## 2016-04-17 DIAGNOSIS — I351 Nonrheumatic aortic (valve) insufficiency: Secondary | ICD-10-CM | POA: Diagnosis not present

## 2016-04-25 DIAGNOSIS — H04123 Dry eye syndrome of bilateral lacrimal glands: Secondary | ICD-10-CM | POA: Diagnosis not present

## 2016-05-08 DIAGNOSIS — R197 Diarrhea, unspecified: Secondary | ICD-10-CM | POA: Diagnosis not present

## 2016-06-15 DIAGNOSIS — R634 Abnormal weight loss: Secondary | ICD-10-CM | POA: Diagnosis not present

## 2016-06-15 DIAGNOSIS — R197 Diarrhea, unspecified: Secondary | ICD-10-CM | POA: Diagnosis not present

## 2016-06-15 DIAGNOSIS — Z79899 Other long term (current) drug therapy: Secondary | ICD-10-CM | POA: Diagnosis not present

## 2016-07-02 DIAGNOSIS — Z6823 Body mass index (BMI) 23.0-23.9, adult: Secondary | ICD-10-CM | POA: Diagnosis not present

## 2016-07-02 DIAGNOSIS — R197 Diarrhea, unspecified: Secondary | ICD-10-CM | POA: Diagnosis not present

## 2016-07-02 DIAGNOSIS — R634 Abnormal weight loss: Secondary | ICD-10-CM | POA: Diagnosis not present

## 2016-07-23 DIAGNOSIS — K219 Gastro-esophageal reflux disease without esophagitis: Secondary | ICD-10-CM | POA: Diagnosis not present

## 2016-07-23 DIAGNOSIS — R634 Abnormal weight loss: Secondary | ICD-10-CM | POA: Diagnosis not present

## 2016-07-23 DIAGNOSIS — R197 Diarrhea, unspecified: Secondary | ICD-10-CM | POA: Diagnosis not present

## 2016-10-18 DIAGNOSIS — H16223 Keratoconjunctivitis sicca, not specified as Sjogren's, bilateral: Secondary | ICD-10-CM | POA: Diagnosis not present

## 2016-10-25 DIAGNOSIS — H16223 Keratoconjunctivitis sicca, not specified as Sjogren's, bilateral: Secondary | ICD-10-CM | POA: Diagnosis not present

## 2016-11-05 DIAGNOSIS — H01021 Squamous blepharitis right upper eyelid: Secondary | ICD-10-CM | POA: Diagnosis not present

## 2016-11-15 DIAGNOSIS — L570 Actinic keratosis: Secondary | ICD-10-CM | POA: Diagnosis not present

## 2016-11-15 DIAGNOSIS — D485 Neoplasm of uncertain behavior of skin: Secondary | ICD-10-CM | POA: Diagnosis not present

## 2016-11-15 DIAGNOSIS — L821 Other seborrheic keratosis: Secondary | ICD-10-CM | POA: Diagnosis not present

## 2016-11-15 DIAGNOSIS — Z85828 Personal history of other malignant neoplasm of skin: Secondary | ICD-10-CM | POA: Diagnosis not present

## 2016-12-13 DIAGNOSIS — Z23 Encounter for immunization: Secondary | ICD-10-CM | POA: Diagnosis not present

## 2017-03-18 DIAGNOSIS — H16223 Keratoconjunctivitis sicca, not specified as Sjogren's, bilateral: Secondary | ICD-10-CM | POA: Diagnosis not present

## 2017-04-24 DIAGNOSIS — F5101 Primary insomnia: Secondary | ICD-10-CM | POA: Diagnosis not present

## 2017-04-24 DIAGNOSIS — K219 Gastro-esophageal reflux disease without esophagitis: Secondary | ICD-10-CM | POA: Diagnosis not present

## 2017-04-24 DIAGNOSIS — Z79899 Other long term (current) drug therapy: Secondary | ICD-10-CM | POA: Diagnosis not present

## 2017-04-24 DIAGNOSIS — D508 Other iron deficiency anemias: Secondary | ICD-10-CM | POA: Diagnosis not present

## 2017-04-24 DIAGNOSIS — Z1389 Encounter for screening for other disorder: Secondary | ICD-10-CM | POA: Diagnosis not present

## 2017-04-24 DIAGNOSIS — Z0000 Encounter for general adult medical examination without abnormal findings: Secondary | ICD-10-CM | POA: Diagnosis not present

## 2017-04-26 DIAGNOSIS — Z85828 Personal history of other malignant neoplasm of skin: Secondary | ICD-10-CM | POA: Diagnosis not present

## 2017-04-26 DIAGNOSIS — L821 Other seborrheic keratosis: Secondary | ICD-10-CM | POA: Diagnosis not present

## 2017-04-26 DIAGNOSIS — H61001 Unspecified perichondritis of right external ear: Secondary | ICD-10-CM | POA: Diagnosis not present

## 2017-04-26 DIAGNOSIS — L812 Freckles: Secondary | ICD-10-CM | POA: Diagnosis not present

## 2017-04-26 DIAGNOSIS — L570 Actinic keratosis: Secondary | ICD-10-CM | POA: Diagnosis not present

## 2017-05-07 DIAGNOSIS — D508 Other iron deficiency anemias: Secondary | ICD-10-CM | POA: Diagnosis not present

## 2017-05-07 DIAGNOSIS — M791 Myalgia, unspecified site: Secondary | ICD-10-CM | POA: Diagnosis not present

## 2017-06-05 DIAGNOSIS — R300 Dysuria: Secondary | ICD-10-CM | POA: Diagnosis not present

## 2017-06-25 ENCOUNTER — Ambulatory Visit
Admission: RE | Admit: 2017-06-25 | Discharge: 2017-06-25 | Disposition: A | Discharge status: Home / Self Care | Payer: Medicare Other | Source: Ambulatory Visit | Attending: Nurse Practitioner | Admitting: Nurse Practitioner

## 2017-06-25 ENCOUNTER — Other Ambulatory Visit: Payer: Self-pay | Admitting: Nurse Practitioner

## 2017-06-25 DIAGNOSIS — Y92009 Unspecified place in unspecified non-institutional (private) residence as the place of occurrence of the external cause: Secondary | ICD-10-CM | POA: Diagnosis not present

## 2017-06-25 DIAGNOSIS — R0789 Other chest pain: Secondary | ICD-10-CM

## 2017-06-25 DIAGNOSIS — S299XXA Unspecified injury of thorax, initial encounter: Secondary | ICD-10-CM | POA: Diagnosis not present

## 2017-06-25 DIAGNOSIS — W19XXXA Unspecified fall, initial encounter: Secondary | ICD-10-CM | POA: Diagnosis not present

## 2017-11-18 DIAGNOSIS — Z23 Encounter for immunization: Secondary | ICD-10-CM | POA: Diagnosis not present

## 2017-12-18 ENCOUNTER — Other Ambulatory Visit: Payer: Self-pay | Admitting: Geriatric Medicine

## 2017-12-18 ENCOUNTER — Ambulatory Visit
Admission: RE | Admit: 2017-12-18 | Discharge: 2017-12-18 | Disposition: A | Discharge status: Home / Self Care | Payer: Medicare Other | Source: Ambulatory Visit | Attending: Geriatric Medicine | Admitting: Geriatric Medicine

## 2017-12-18 DIAGNOSIS — M79602 Pain in left arm: Secondary | ICD-10-CM

## 2017-12-18 DIAGNOSIS — R350 Frequency of micturition: Secondary | ICD-10-CM | POA: Diagnosis not present

## 2017-12-18 DIAGNOSIS — M19041 Primary osteoarthritis, right hand: Secondary | ICD-10-CM | POA: Diagnosis not present

## 2017-12-18 DIAGNOSIS — M79672 Pain in left foot: Secondary | ICD-10-CM | POA: Diagnosis not present

## 2017-12-18 DIAGNOSIS — M19042 Primary osteoarthritis, left hand: Secondary | ICD-10-CM | POA: Diagnosis not present

## 2017-12-30 ENCOUNTER — Ambulatory Visit (INDEPENDENT_AMBULATORY_CARE_PROVIDER_SITE_OTHER): Payer: Medicare Other | Admitting: Podiatry

## 2017-12-30 ENCOUNTER — Ambulatory Visit (INDEPENDENT_AMBULATORY_CARE_PROVIDER_SITE_OTHER): Payer: Medicare Other

## 2017-12-30 ENCOUNTER — Encounter: Payer: Self-pay | Admitting: Podiatry

## 2017-12-30 DIAGNOSIS — M722 Plantar fascial fibromatosis: Secondary | ICD-10-CM | POA: Diagnosis not present

## 2017-12-30 DIAGNOSIS — I351 Nonrheumatic aortic (valve) insufficiency: Secondary | ICD-10-CM | POA: Insufficient documentation

## 2017-12-30 DIAGNOSIS — M810 Age-related osteoporosis without current pathological fracture: Secondary | ICD-10-CM | POA: Insufficient documentation

## 2017-12-30 NOTE — Patient Instructions (Addendum)

## 2017-12-30 NOTE — Progress Notes (Signed)
Subjective:  Patient ID: [name], female    DOB: Oct 04, 1928,  MRN: [id number] HPI Chief Complaint  Patient presents with  . Foot Pain    Plantar heel left - aching x few months, some AM pain, but usually feels better in the mornings, tried extra strength Tylenol and aspercreme  . New Patient (Initial Visit)    84 y.o. female presents with the above complaint.   ROS: Denies fever chills nausea vomiting muscle aches pains calf pain back pain chest pain shortness of breath.  Past Medical History:  Diagnosis Date  . Arthritis   . Chest pain    a. 05/2015 - post-prandial.  . GERD (gastroesophageal reflux disease)    a. takes Omeprazole daily, prev seen by D. Brodie.  Marland Kitchen Heart murmur    a. 05/2013 Echo: EF 55-60%, gr2 DD, mild AI/MR.  Marland Kitchen History of blood transfusion    no abnormal reaction noted   . History of migraine headaches    a. last 10/2013  . Iron deficiency anemia   . Joint pain   . PONV (postoperative nausea and vomiting)   . Rectocele 2011   Past Surgical History:  Procedure Laterality Date  . cataract surgery  Bilateral    R 03/2001; L 05/2001.  Marland Kitchen COLONOSCOPY    . HYSTEROSCOPY  11/1999   w/ resection of endometrial polyp by uterine curetting.  Marland Kitchen JOINT REPLACEMENT Bilateral R-9/04, L- 8/05   Total Knee Arthroplasties.  Marland Kitchen REVERSE SHOULDER ARTHROPLASTY Right 11/26/2013   Procedure: RIGHT REVERSE SHOULDER ARTHROPLASTY;  Surgeon: Marin Shutter, MD;  Location: [facility];  Service: Orthopedics;  Laterality: Right;  . SHOULDER ARTHROSCOPY Right   . THYROIDECTOMY  at age 44  . TONSILLECTOMY      Current Outpatient Medications:  .  Probiotic Product (PROBIOTIC DAILY PO), Take by mouth., Disp: , Rfl:  .  trolamine salicylate (ASPERCREME) 10 % cream, Apply 1 application topically as needed for muscle pain., Disp: , Rfl:  .  cholecalciferol (VITAMIN D) 1000 units tablet, Take 1,000 Units by mouth daily., Disp: , Rfl:  .  Multiple Vitamin (MULTIVITAMIN WITH MINERALS) TABS  tablet, Take 1 tablet by mouth daily., Disp: , Rfl:  .  omeprazole (PRILOSEC OTC) 20 MG tablet, Take 40 mg by mouth daily. , Disp: , Rfl:   Allergies  Allergen Reactions  . Sulfonamide Derivatives Hives   Review of Systems Objective:  There were no vitals filed for this visit.  General: Well developed, nourished, in no acute distress, alert and oriented x3   Dermatological: Skin is warm, dry and supple bilateral. Nails x 10 are well maintained; remaining integument appears unremarkable at this time. There are no open sores, no preulcerative lesions, no rash or signs of infection present.  Vascular: Dorsalis Pedis artery and Posterior Tibial artery pedal pulses are 2/4 bilateral with immedate capillary fill time. Pedal hair growth present. No varicosities and no lower extremity edema present bilateral.   Neruologic: Grossly intact via light touch bilateral. Vibratory intact via tuning fork bilateral. Protective threshold with Semmes Wienstein monofilament intact to all pedal sites bilateral. Patellar and Achilles deep tendon reflexes 2+ bilateral. No Babinski or clonus noted bilateral.   Musculoskeletal: No gross boney pedal deformities bilateral. No pain, crepitus, or limitation noted with foot and ankle range of motion bilateral. Muscular strength 5/5 in all groups tested bilateral.  Pain on palpation medial calcaneal tubercle of the left heel.  No pain on medial and lateral compression of the calcaneus.  Gait: Unassisted, Nonantalgic.    Radiographs:  Radiographs taken today demonstrate small plantar distally oriented calcaneal spur to the left heel.  No fractures are identified.  Soft tissue increase in density plantar calcaneal insertion site is noted.  Assessment & Plan:   Assessment: Plantar fasciitis left.  Plan: Discussed etiology pathology conservative surgical therapies.  Injected 20 mg Kenalog 5 mg Marcaine point maximal tenderness left heel after sterile Betadine skin prep  and the use of ethyl chloride.  She tolerated procedure well without complications.  Discussed appropriate shoe gear stretching exercise ice therapy sugar modifications placed in plantar fascial brace and I would like to follow-up with her in 1 month.     Ginevra Tacker T. [place], [place]

## 2018-01-29 ENCOUNTER — Ambulatory Visit: Payer: PRIVATE HEALTH INSURANCE | Admitting: Podiatry

## 2018-02-03 ENCOUNTER — Telehealth: Payer: Self-pay | Admitting: Podiatry

## 2018-02-03 NOTE — Telephone Encounter (Signed)
I returned call to patient. She has questions regarding her stretching exercises and rolling her foot on a tennis ball.  All questions were answered and she verbalized understanding

## 2018-02-03 NOTE — Telephone Encounter (Signed)
Pt seen 11/11 for Plantar Fasciitis and canceled follow up appt on 12/11. Called requesting to speak with Dr. Stephenie Acres nurse.

## 2018-02-26 ENCOUNTER — Ambulatory Visit (INDEPENDENT_AMBULATORY_CARE_PROVIDER_SITE_OTHER): Payer: Medicare Other | Admitting: Podiatry

## 2018-02-26 ENCOUNTER — Encounter: Payer: Self-pay | Admitting: Podiatry

## 2018-02-26 DIAGNOSIS — R234 Changes in skin texture: Secondary | ICD-10-CM | POA: Diagnosis not present

## 2018-02-26 DIAGNOSIS — M722 Plantar fascial fibromatosis: Secondary | ICD-10-CM

## 2018-02-26 MED ORDER — MUPIROCIN 2 % EX OINT: TOPICAL_OINTMENT | CUTANEOUS | 1 refills | Status: DC

## 2018-02-26 NOTE — Progress Notes (Signed)
She presents today for a chief complaint of a painful area in plantar aspect of the heel she states that that shot worked really well and get rid of my heel problems but now that will splint in the heel it is very painful.  Objective: Vital signs are stable alert oriented x3.  There is mild erythema surrounding a very fragile fissure on the plantar aspect of the left heel.  No purulence no malodor.  Assessment: Painful fissure plantar heel.  Plan: Instructed her to apply a small amount of Bactroban ointment which we ordered for her today to the area and cover with a dry sterile compressive dressing.

## 2018-02-28 DIAGNOSIS — M5442 Lumbago with sciatica, left side: Secondary | ICD-10-CM | POA: Diagnosis not present

## 2018-02-28 DIAGNOSIS — M5441 Lumbago with sciatica, right side: Secondary | ICD-10-CM | POA: Diagnosis not present

## 2018-03-07 ENCOUNTER — Other Ambulatory Visit: Payer: Self-pay | Admitting: Internal Medicine

## 2018-03-07 ENCOUNTER — Ambulatory Visit
Admission: RE | Admit: 2018-03-07 | Discharge: 2018-03-07 | Disposition: A | Discharge status: Home / Self Care | Payer: Medicare Other | Source: Ambulatory Visit | Attending: Internal Medicine | Admitting: Internal Medicine

## 2018-03-07 DIAGNOSIS — M8588 Other specified disorders of bone density and structure, other site: Secondary | ICD-10-CM | POA: Diagnosis not present

## 2018-03-07 DIAGNOSIS — M545 Low back pain: Secondary | ICD-10-CM | POA: Diagnosis not present

## 2018-03-07 DIAGNOSIS — M5441 Lumbago with sciatica, right side: Secondary | ICD-10-CM

## 2018-03-07 DIAGNOSIS — K5900 Constipation, unspecified: Secondary | ICD-10-CM | POA: Diagnosis not present

## 2018-03-07 DIAGNOSIS — S3992XA Unspecified injury of lower back, initial encounter: Secondary | ICD-10-CM | POA: Diagnosis not present

## 2018-03-17 ENCOUNTER — Other Ambulatory Visit: Payer: Self-pay | Admitting: Geriatric Medicine

## 2018-03-17 DIAGNOSIS — M545 Low back pain, unspecified: Secondary | ICD-10-CM

## 2018-03-17 DIAGNOSIS — I7090 Unspecified atherosclerosis: Secondary | ICD-10-CM | POA: Diagnosis not present

## 2018-03-17 DIAGNOSIS — R350 Frequency of micturition: Secondary | ICD-10-CM | POA: Diagnosis not present

## 2018-03-18 ENCOUNTER — Ambulatory Visit
Admission: RE | Admit: 2018-03-18 | Discharge: 2018-03-18 | Disposition: A | Discharge status: Home / Self Care | Payer: Medicare Other | Source: Ambulatory Visit | Attending: Geriatric Medicine | Admitting: Geriatric Medicine

## 2018-03-18 DIAGNOSIS — M545 Low back pain, unspecified: Secondary | ICD-10-CM

## 2018-03-18 DIAGNOSIS — S32040A Wedge compression fracture of fourth lumbar vertebra, initial encounter for closed fracture: Secondary | ICD-10-CM | POA: Diagnosis not present

## 2018-03-19 ENCOUNTER — Ambulatory Visit: Payer: PRIVATE HEALTH INSURANCE | Admitting: Podiatry

## 2018-04-30 ENCOUNTER — Other Ambulatory Visit: Payer: Self-pay | Admitting: Geriatric Medicine

## 2018-04-30 DIAGNOSIS — I7090 Unspecified atherosclerosis: Secondary | ICD-10-CM | POA: Diagnosis not present

## 2018-04-30 DIAGNOSIS — M810 Age-related osteoporosis without current pathological fracture: Secondary | ICD-10-CM | POA: Diagnosis not present

## 2018-04-30 DIAGNOSIS — Z1389 Encounter for screening for other disorder: Secondary | ICD-10-CM | POA: Diagnosis not present

## 2018-04-30 DIAGNOSIS — S32040A Wedge compression fracture of fourth lumbar vertebra, initial encounter for closed fracture: Secondary | ICD-10-CM | POA: Diagnosis not present

## 2018-04-30 DIAGNOSIS — K219 Gastro-esophageal reflux disease without esophagitis: Secondary | ICD-10-CM | POA: Diagnosis not present

## 2018-04-30 DIAGNOSIS — Z79899 Other long term (current) drug therapy: Secondary | ICD-10-CM | POA: Diagnosis not present

## 2018-04-30 DIAGNOSIS — K9089 Other intestinal malabsorption: Secondary | ICD-10-CM | POA: Diagnosis not present

## 2018-04-30 DIAGNOSIS — I351 Nonrheumatic aortic (valve) insufficiency: Secondary | ICD-10-CM | POA: Diagnosis not present

## 2018-04-30 DIAGNOSIS — Z0000 Encounter for general adult medical examination without abnormal findings: Secondary | ICD-10-CM | POA: Diagnosis not present

## 2018-05-22 ENCOUNTER — Other Ambulatory Visit: Payer: Self-pay | Admitting: Geriatric Medicine

## 2018-05-22 ENCOUNTER — Ambulatory Visit
Admission: RE | Admit: 2018-05-22 | Discharge: 2018-05-22 | Disposition: A | Discharge status: Home / Self Care | Payer: Medicare Other | Source: Ambulatory Visit | Attending: Geriatric Medicine | Admitting: Geriatric Medicine

## 2018-05-22 ENCOUNTER — Other Ambulatory Visit: Payer: Self-pay

## 2018-05-22 DIAGNOSIS — M545 Low back pain, unspecified: Secondary | ICD-10-CM

## 2018-06-20 DIAGNOSIS — R278 Other lack of coordination: Secondary | ICD-10-CM | POA: Diagnosis not present

## 2018-06-20 DIAGNOSIS — F0390 Unspecified dementia without behavioral disturbance: Secondary | ICD-10-CM | POA: Diagnosis not present

## 2018-06-20 DIAGNOSIS — Z741 Need for assistance with personal care: Secondary | ICD-10-CM | POA: Diagnosis not present

## 2018-06-20 DIAGNOSIS — M5442 Lumbago with sciatica, left side: Secondary | ICD-10-CM | POA: Diagnosis not present

## 2018-06-20 DIAGNOSIS — M6281 Muscle weakness (generalized): Secondary | ICD-10-CM | POA: Diagnosis not present

## 2018-06-20 DIAGNOSIS — M8088XD Other osteoporosis with current pathological fracture, vertebra(e), subsequent encounter for fracture with routine healing: Secondary | ICD-10-CM | POA: Diagnosis not present

## 2018-06-24 DIAGNOSIS — R278 Other lack of coordination: Secondary | ICD-10-CM | POA: Diagnosis not present

## 2018-06-24 DIAGNOSIS — F0390 Unspecified dementia without behavioral disturbance: Secondary | ICD-10-CM | POA: Diagnosis not present

## 2018-06-24 DIAGNOSIS — M6281 Muscle weakness (generalized): Secondary | ICD-10-CM | POA: Diagnosis not present

## 2018-06-24 DIAGNOSIS — M5442 Lumbago with sciatica, left side: Secondary | ICD-10-CM | POA: Diagnosis not present

## 2018-06-24 DIAGNOSIS — M8088XD Other osteoporosis with current pathological fracture, vertebra(e), subsequent encounter for fracture with routine healing: Secondary | ICD-10-CM | POA: Diagnosis not present

## 2018-06-24 DIAGNOSIS — Z741 Need for assistance with personal care: Secondary | ICD-10-CM | POA: Diagnosis not present

## 2018-06-25 ENCOUNTER — Other Ambulatory Visit: Payer: Self-pay

## 2018-06-25 ENCOUNTER — Emergency Department
Admission: EM | Admit: 2018-06-25 | Discharge: 2018-06-25 | Disposition: A | Discharge status: Home / Self Care | Payer: Medicare Other | Attending: Emergency Medicine | Admitting: Emergency Medicine

## 2018-06-25 ENCOUNTER — Emergency Department: Payer: Medicare Other

## 2018-06-25 DIAGNOSIS — I10 Essential (primary) hypertension: Secondary | ICD-10-CM | POA: Diagnosis not present

## 2018-06-25 DIAGNOSIS — F0390 Unspecified dementia without behavioral disturbance: Secondary | ICD-10-CM | POA: Diagnosis not present

## 2018-06-25 DIAGNOSIS — R0902 Hypoxemia: Secondary | ICD-10-CM | POA: Diagnosis not present

## 2018-06-25 DIAGNOSIS — M6281 Muscle weakness (generalized): Secondary | ICD-10-CM | POA: Diagnosis not present

## 2018-06-25 DIAGNOSIS — R4182 Altered mental status, unspecified: Secondary | ICD-10-CM | POA: Diagnosis not present

## 2018-06-25 DIAGNOSIS — M2550 Pain in unspecified joint: Secondary | ICD-10-CM | POA: Diagnosis not present

## 2018-06-25 DIAGNOSIS — Z79899 Other long term (current) drug therapy: Secondary | ICD-10-CM | POA: Diagnosis not present

## 2018-06-25 DIAGNOSIS — Z7401 Bed confinement status: Secondary | ICD-10-CM | POA: Diagnosis not present

## 2018-06-25 DIAGNOSIS — Z741 Need for assistance with personal care: Secondary | ICD-10-CM | POA: Diagnosis not present

## 2018-06-25 DIAGNOSIS — Z96611 Presence of right artificial shoulder joint: Secondary | ICD-10-CM | POA: Insufficient documentation

## 2018-06-25 DIAGNOSIS — R278 Other lack of coordination: Secondary | ICD-10-CM | POA: Diagnosis not present

## 2018-06-25 DIAGNOSIS — Z96653 Presence of artificial knee joint, bilateral: Secondary | ICD-10-CM | POA: Insufficient documentation

## 2018-06-25 DIAGNOSIS — R404 Transient alteration of awareness: Secondary | ICD-10-CM | POA: Diagnosis not present

## 2018-06-25 DIAGNOSIS — E876 Hypokalemia: Secondary | ICD-10-CM | POA: Diagnosis not present

## 2018-06-25 DIAGNOSIS — R410 Disorientation, unspecified: Secondary | ICD-10-CM | POA: Diagnosis not present

## 2018-06-25 DIAGNOSIS — M8088XD Other osteoporosis with current pathological fracture, vertebra(e), subsequent encounter for fracture with routine healing: Secondary | ICD-10-CM | POA: Diagnosis not present

## 2018-06-25 DIAGNOSIS — M5442 Lumbago with sciatica, left side: Secondary | ICD-10-CM | POA: Diagnosis not present

## 2018-06-25 LAB — URINALYSIS, COMPLETE (UACMP) WITH MICROSCOPIC
Bacteria, UA: NONE SEEN
Bilirubin Urine: NEGATIVE
Glucose, UA: NEGATIVE mg/dL
Hgb urine dipstick: NEGATIVE
Ketones, ur: 5 mg/dL — AB
Leukocytes,Ua: NEGATIVE
Nitrite: NEGATIVE
Protein, ur: NEGATIVE mg/dL
Specific Gravity, Urine: 1.014 (ref 1.005–1.030)
Squamous Epithelial / HPF: NONE SEEN (ref 0–5)
WBC, UA: NONE SEEN WBC/hpf (ref 0–5)
pH: 7 (ref 5.0–8.0)

## 2018-06-25 LAB — COMPREHENSIVE METABOLIC PANEL
ALT: 20 U/L (ref 0–44)
AST: 20 U/L (ref 15–41)
Albumin: 3.5 g/dL (ref 3.5–5.0)
Alkaline Phosphatase: 94 U/L (ref 38–126)
Anion gap: 12 (ref 5–15)
BUN: 13 mg/dL (ref 8–23)
CO2: 31 mmol/L (ref 22–32)
Calcium: 8.6 mg/dL — ABNORMAL LOW (ref 8.9–10.3)
Chloride: 95 mmol/L — ABNORMAL LOW (ref 98–111)
Creatinine, Ser: 0.51 mg/dL (ref 0.44–1.00)
GFR calc Af Amer: >60 mL/min (ref >60)
GFR calc non Af Amer: >60 mL/min (ref >60)
Glucose, Bld: 122 mg/dL — ABNORMAL HIGH (ref 70–99)
Potassium: 2.6 mmol/L — CL (ref 3.5–5.1)
Sodium: 138 mmol/L (ref 135–145)
Total Bilirubin: 0.7 mg/dL (ref 0.3–1.2)
Total Protein: 6.4 g/dL — ABNORMAL LOW (ref 6.5–8.1)

## 2018-06-25 LAB — CBC WITH DIFFERENTIAL/PLATELET
Abs Immature Granulocytes: 0.05 10*3/uL (ref 0.00–0.07)
Basophils Absolute: 0 10*3/uL (ref 0.0–0.1)
Basophils Relative: 0 %
Eosinophils Absolute: 0 10*3/uL (ref 0.0–0.5)
Eosinophils Relative: 0 %
HCT: 28.5 % — ABNORMAL LOW (ref 36.0–46.0)
Hemoglobin: 9.6 g/dL — ABNORMAL LOW (ref 12.0–15.0)
Immature Granulocytes: 1 %
Lymphocytes Relative: 14 %
Lymphs Abs: 1.4 10*3/uL (ref 0.7–4.0)
MCH: 32.2 pg (ref 26.0–34.0)
MCHC: 33.7 g/dL (ref 30.0–36.0)
MCV: 95.6 fL (ref 80.0–100.0)
Monocytes Absolute: 1.3 10*3/uL — ABNORMAL HIGH (ref 0.1–1.0)
Monocytes Relative: 12 %
Neutro Abs: 7.5 10*3/uL (ref 1.7–7.7)
Neutrophils Relative %: 73 %
Platelets: 455 10*3/uL — ABNORMAL HIGH (ref 150–400)
RBC: 2.98 MIL/uL — ABNORMAL LOW (ref 3.87–5.11)
RDW: 16.1 % — ABNORMAL HIGH (ref 11.5–15.5)
WBC: 10.3 10*3/uL (ref 4.0–10.5)
nRBC: 0.3 % — ABNORMAL HIGH (ref 0.0–0.2)

## 2018-06-25 MED ORDER — POTASSIUM CHLORIDE CRYS ER 20 MEQ PO TBCR
40.0000 meq | EXTENDED_RELEASE_TABLET | Freq: Once | ORAL | Status: AC
  Administered 2018-06-25: 40 meq via ORAL
  Filled 2018-06-25: qty 2

## 2018-06-25 NOTE — ED Notes (Signed)
Report called to [place] at [facility].

## 2018-06-25 NOTE — ED Triage Notes (Signed)
Pt arrived via ems from [facility] for report of increased confusion and falls since compression fracture 3/27 - pt has reported to have increased urinary frequency

## 2018-06-25 NOTE — ED Notes (Signed)
Waiting on [facility]

## 2018-06-25 NOTE — ED Notes (Signed)
Lab called with a critical potassium of 2.6- will notify Dr Archie Balboa

## 2018-06-25 NOTE — ED Notes (Signed)
Lab called and stated green top was hemolyzed and had a questionable read on the purple top- already resent labs

## 2018-06-25 NOTE — ED Notes (Signed)
Patient transported to CT 

## 2018-06-25 NOTE — ED Notes (Signed)
Dr Archie Balboa notified of critical potassium

## 2018-06-25 NOTE — ED Provider Notes (Signed)
[facility] Emergency Department Provider Note  ____________________________________________   I have reviewed the triage vital signs and the nursing notes.   HISTORY  Chief Complaint Back pain  History limited by: Not Limited   HPI Melissa [name] is a 85 y.o. female who presents to the emergency department today because of concern for falls and increased confusion. Patient's chief complaint however is for back pain. Recently was diagnosed with compression fracture and continues to wear brace. The patient states that the pain has not changed. Patient denies any fevers, denies any chest pain, abdominal pain, n/v/d, urine changes.   Records reviewed. Per medical record review patient has a history of GERD, joint pain.   Past Medical History:  Diagnosis Date  . Arthritis   . Chest pain    a. 05/2015 - post-prandial.  . GERD (gastroesophageal reflux disease)    a. takes Omeprazole daily, prev seen by D. Brodie.  Marland Kitchen Heart murmur    a. 05/2013 Echo: EF 55-60%, gr2 DD, mild AI/MR.  Marland Kitchen History of blood transfusion    no abnormal reaction noted   . History of migraine headaches    a. last 10/2013  . Iron deficiency anemia   . Joint pain   . PONV (postoperative nausea and vomiting)   . Rectocele 2011    Patient Active Problem List   Diagnosis Date Noted  . AI (aortic incompetence) 12/30/2017  . OP (osteoporosis) 12/30/2017  . S/P shoulder replacement 11/26/2013  . ANEMIA, IRON DEFICIENCY 04/05/2009  . HEMORRHOIDS, INTERNAL 04/05/2009  . GERD 04/05/2009  . CONSTIPATION 04/05/2009  . ARTHRITIS 04/05/2009    Past Surgical History:  Procedure Laterality Date  . cataract surgery  Bilateral    R 03/2001; L 05/2001.  Marland Kitchen COLONOSCOPY    . HYSTEROSCOPY  11/1999   w/ resection of endometrial polyp by uterine curetting.  Marland Kitchen JOINT REPLACEMENT Bilateral R-9/04, L- 8/05   Total Knee Arthroplasties.  Marland Kitchen REVERSE SHOULDER ARTHROPLASTY Right 11/26/2013   Procedure: RIGHT  REVERSE SHOULDER ARTHROPLASTY;  Surgeon: Marin Shutter, MD;  Location: [facility];  Service: Orthopedics;  Laterality: Right;  . SHOULDER ARTHROSCOPY Right   . THYROIDECTOMY  at age 89  . TONSILLECTOMY      Prior to Admission medications   Medication Sig Start Date End Date Taking? Authorizing Provider  cholecalciferol (VITAMIN D) 1000 units tablet Take 1,000 Units by mouth daily.    [provider]  Multiple Vitamin (MULTIVITAMIN WITH MINERALS) TABS tablet Take 1 tablet by mouth daily.    [provider]  mupirocin ointment (BACTROBAN) 2 % Apply to wound after soaking BID 02/26/18   Hyatt, Max T, DPM  omeprazole (PRILOSEC OTC) 20 MG tablet Take 40 mg by mouth daily.     [provider]  Probiotic Product (PROBIOTIC DAILY PO) Take by mouth.    [provider]  trolamine salicylate (ASPERCREME) 10 % cream Apply 1 application topically as needed for muscle pain.    [provider]    Allergies Sulfonamide derivatives  Family History  Problem Relation Age of Onset  . Heart attack Mother        died @ 39  . Heart attack Brother        died @ 32  . Heart attack Brother        died @ 67  . Other Father        died suddenly @ 27.    Social History Social History  Tobacco Use  . Smoking status: Never Smoker  . Smokeless tobacco: Never Used  Substance Use Topics  . Alcohol use: No  . Drug use: No    Review of Systems Constitutional: No fever/chills Eyes: No visual changes. ENT: No sore throat. Cardiovascular: Denies chest pain. Respiratory: Denies shortness of breath. Gastrointestinal: No abdominal pain.  No nausea, no vomiting.  No diarrhea.   Genitourinary: Negative for dysuria. Musculoskeletal: Positive for back pain.  Skin: Negative for rash. Neurological: Negative for headaches, focal weakness or numbness.  ____________________________________________   PHYSICAL EXAM:  VITAL SIGNS: ED Triage Vitals [06/25/18 1610]  Enc  Vitals Group     BP (!) 166/63     Pulse Rate 78     Resp 15     Temp 97.9 F (36.6 C)     Temp Source Oral     SpO2 95 %     Weight 125 lb (56.7 kg)     Height 5\' 4"  (1.626 m)   Constitutional: Alert and oriented.  Eyes: Conjunctivae are normal.  ENT      Head: Normocephalic and atraumatic.      Nose: No congestion/rhinnorhea.      Mouth/Throat: Mucous membranes are moist.      Neck: No stridor. Hematological/Lymphatic/Immunilogical: No cervical lymphadenopathy. Cardiovascular: Normal rate, regular rhythm.  No murmurs, rubs, or gallops.  Respiratory: Normal respiratory effort without tachypnea nor retractions. Breath sounds are clear and equal bilaterally. No wheezes/rales/rhonchi. Gastrointestinal: Soft and non tender. No rebound. No guarding.  Genitourinary: Deferred Musculoskeletal: Normal range of motion in all extremities. Back brace in place.  Neurologic:  Normal speech and language. No gross focal neurologic deficits are appreciated.  Skin:  Skin is warm, dry and intact. No rash noted. Psychiatric: Mood and affect are normal. Speech and behavior are normal. Patient exhibits appropriate insight and judgment.  ____________________________________________    LABS (pertinent positives/negatives)  UA cloudy, ketones 5, wbc none seen, bacteria none seen, leukocytes negative CMP na 138, k 2.6, glu 122, cr 0.51 CBC wbc 10.3, hgb 9.6, plt 455   ____________________________________________   EKG  I, Nance Pear, attending physician, personally viewed and interpreted this EKG  EKG Time: 1612 Rate: 73 Rhythm: sinus rhythm Axis: normal Intervals: qtc 483 QRS: LVH ST changes: no st elevation Impression: abnormal ekg   ____________________________________________    RADIOLOGY  CT head No acute abnormality  ____________________________________________   PROCEDURES  Procedures  ____________________________________________   INITIAL IMPRESSION /  ASSESSMENT AND PLAN / ED COURSE  Pertinent labs & imaging results that were available during my care of the patient were reviewed by me and considered in my medical decision making (see chart for details).   Patient presented to the emergency department today because of concern for confusion and increased falls. Work up did show low potassium level. Patient was given potassium here. Head ct negative for acute findings. ua not consistent with infection. Patient denies any chest pain, no nausea or vomiting. Will plan on discharging back to living facility.  ____________________________________________   FINAL CLINICAL IMPRESSION(S) / ED DIAGNOSES  Final diagnoses:  Confusion  Hypokalemia     Note: This dictation was prepared with Dragon dictation. Any transcriptional errors that result from this process are unintentional     Nance Pear, MD 06/25/18 [phone]

## 2018-06-25 NOTE — ED Notes (Signed)
Report received from offgoing RN. Pt waiting for CT results.

## 2018-06-25 NOTE — ED Notes (Signed)
Wilford Sports RN called from [facility] and reports that if pt is not admitted tonight that she will be returning to the skilled nursing side of [place] for the night and will need non-emergent EMS transport back She will be going to [id number] and the number for report is [phone]

## 2018-06-25 NOTE — ED Notes (Signed)
Husband [name] updated. [phone]

## 2018-06-25 NOTE — Discharge Instructions (Addendum)
Please seek medical attention for any high fevers, chest pain, shortness of breath, change in behavior, persistent vomiting, bloody stool or any other new or concerning symptoms.  

## 2018-06-25 NOTE — ED Notes (Signed)
Pt assisted with toileting

## 2018-06-26 DIAGNOSIS — M6281 Muscle weakness (generalized): Secondary | ICD-10-CM | POA: Diagnosis not present

## 2018-06-26 DIAGNOSIS — F0390 Unspecified dementia without behavioral disturbance: Secondary | ICD-10-CM | POA: Diagnosis not present

## 2018-06-26 DIAGNOSIS — R278 Other lack of coordination: Secondary | ICD-10-CM | POA: Diagnosis not present

## 2018-06-26 DIAGNOSIS — M8088XD Other osteoporosis with current pathological fracture, vertebra(e), subsequent encounter for fracture with routine healing: Secondary | ICD-10-CM | POA: Diagnosis not present

## 2018-06-26 DIAGNOSIS — Z741 Need for assistance with personal care: Secondary | ICD-10-CM | POA: Diagnosis not present

## 2018-06-26 DIAGNOSIS — M5442 Lumbago with sciatica, left side: Secondary | ICD-10-CM | POA: Diagnosis not present

## 2018-06-26 LAB — URINE CULTURE: Culture: NO GROWTH

## 2018-06-27 DIAGNOSIS — M5442 Lumbago with sciatica, left side: Secondary | ICD-10-CM | POA: Diagnosis not present

## 2018-06-27 DIAGNOSIS — Z741 Need for assistance with personal care: Secondary | ICD-10-CM | POA: Diagnosis not present

## 2018-06-27 DIAGNOSIS — M8088XD Other osteoporosis with current pathological fracture, vertebra(e), subsequent encounter for fracture with routine healing: Secondary | ICD-10-CM | POA: Diagnosis not present

## 2018-06-27 DIAGNOSIS — R278 Other lack of coordination: Secondary | ICD-10-CM | POA: Diagnosis not present

## 2018-06-27 DIAGNOSIS — F0390 Unspecified dementia without behavioral disturbance: Secondary | ICD-10-CM | POA: Diagnosis not present

## 2018-06-27 DIAGNOSIS — M6281 Muscle weakness (generalized): Secondary | ICD-10-CM | POA: Diagnosis not present

## 2018-06-30 ENCOUNTER — Other Ambulatory Visit: Payer: Self-pay | Admitting: Geriatric Medicine

## 2018-06-30 ENCOUNTER — Other Ambulatory Visit (HOSPITAL_COMMUNITY): Payer: Self-pay | Admitting: Geriatric Medicine

## 2018-06-30 DIAGNOSIS — M6281 Muscle weakness (generalized): Secondary | ICD-10-CM | POA: Diagnosis not present

## 2018-06-30 DIAGNOSIS — R410 Disorientation, unspecified: Secondary | ICD-10-CM

## 2018-06-30 DIAGNOSIS — M5442 Lumbago with sciatica, left side: Secondary | ICD-10-CM | POA: Diagnosis not present

## 2018-06-30 DIAGNOSIS — M8088XD Other osteoporosis with current pathological fracture, vertebra(e), subsequent encounter for fracture with routine healing: Secondary | ICD-10-CM | POA: Diagnosis not present

## 2018-06-30 DIAGNOSIS — Z741 Need for assistance with personal care: Secondary | ICD-10-CM | POA: Diagnosis not present

## 2018-06-30 DIAGNOSIS — R278 Other lack of coordination: Secondary | ICD-10-CM | POA: Diagnosis not present

## 2018-06-30 DIAGNOSIS — F0390 Unspecified dementia without behavioral disturbance: Secondary | ICD-10-CM | POA: Diagnosis not present

## 2018-07-01 DIAGNOSIS — M8088XD Other osteoporosis with current pathological fracture, vertebra(e), subsequent encounter for fracture with routine healing: Secondary | ICD-10-CM | POA: Diagnosis not present

## 2018-07-01 DIAGNOSIS — F0390 Unspecified dementia without behavioral disturbance: Secondary | ICD-10-CM | POA: Diagnosis not present

## 2018-07-01 DIAGNOSIS — M5442 Lumbago with sciatica, left side: Secondary | ICD-10-CM | POA: Diagnosis not present

## 2018-07-01 DIAGNOSIS — R278 Other lack of coordination: Secondary | ICD-10-CM | POA: Diagnosis not present

## 2018-07-01 DIAGNOSIS — Z741 Need for assistance with personal care: Secondary | ICD-10-CM | POA: Diagnosis not present

## 2018-07-01 DIAGNOSIS — M6281 Muscle weakness (generalized): Secondary | ICD-10-CM | POA: Diagnosis not present

## 2018-07-02 DIAGNOSIS — M8088XD Other osteoporosis with current pathological fracture, vertebra(e), subsequent encounter for fracture with routine healing: Secondary | ICD-10-CM | POA: Diagnosis not present

## 2018-07-02 DIAGNOSIS — F0390 Unspecified dementia without behavioral disturbance: Secondary | ICD-10-CM | POA: Diagnosis not present

## 2018-07-02 DIAGNOSIS — Z741 Need for assistance with personal care: Secondary | ICD-10-CM | POA: Diagnosis not present

## 2018-07-02 DIAGNOSIS — M6281 Muscle weakness (generalized): Secondary | ICD-10-CM | POA: Diagnosis not present

## 2018-07-02 DIAGNOSIS — R278 Other lack of coordination: Secondary | ICD-10-CM | POA: Diagnosis not present

## 2018-07-02 DIAGNOSIS — M5442 Lumbago with sciatica, left side: Secondary | ICD-10-CM | POA: Diagnosis not present

## 2018-07-03 ENCOUNTER — Other Ambulatory Visit: Payer: Medicare Other

## 2018-07-03 ENCOUNTER — Ambulatory Visit
Admission: RE | Admit: 2018-07-03 | Discharge: 2018-07-03 | Disposition: A | Discharge status: Home / Self Care | Payer: Medicare Other | Source: Ambulatory Visit | Attending: Geriatric Medicine | Admitting: Geriatric Medicine

## 2018-07-03 ENCOUNTER — Other Ambulatory Visit: Payer: Self-pay

## 2018-07-03 DIAGNOSIS — M5442 Lumbago with sciatica, left side: Secondary | ICD-10-CM | POA: Diagnosis not present

## 2018-07-03 DIAGNOSIS — M6281 Muscle weakness (generalized): Secondary | ICD-10-CM | POA: Diagnosis not present

## 2018-07-03 DIAGNOSIS — F0390 Unspecified dementia without behavioral disturbance: Secondary | ICD-10-CM | POA: Diagnosis not present

## 2018-07-03 DIAGNOSIS — Z741 Need for assistance with personal care: Secondary | ICD-10-CM | POA: Diagnosis not present

## 2018-07-03 DIAGNOSIS — R410 Disorientation, unspecified: Secondary | ICD-10-CM | POA: Insufficient documentation

## 2018-07-03 DIAGNOSIS — M8088XD Other osteoporosis with current pathological fracture, vertebra(e), subsequent encounter for fracture with routine healing: Secondary | ICD-10-CM | POA: Diagnosis not present

## 2018-07-03 DIAGNOSIS — R278 Other lack of coordination: Secondary | ICD-10-CM | POA: Diagnosis not present

## 2018-07-04 DIAGNOSIS — M810 Age-related osteoporosis without current pathological fracture: Secondary | ICD-10-CM | POA: Diagnosis not present

## 2018-07-04 DIAGNOSIS — S32010A Wedge compression fracture of first lumbar vertebra, initial encounter for closed fracture: Secondary | ICD-10-CM | POA: Diagnosis not present

## 2018-07-04 DIAGNOSIS — R296 Repeated falls: Secondary | ICD-10-CM | POA: Diagnosis not present

## 2018-07-04 DIAGNOSIS — K219 Gastro-esophageal reflux disease without esophagitis: Secondary | ICD-10-CM | POA: Diagnosis not present

## 2018-07-07 DIAGNOSIS — M8088XD Other osteoporosis with current pathological fracture, vertebra(e), subsequent encounter for fracture with routine healing: Secondary | ICD-10-CM | POA: Diagnosis not present

## 2018-07-07 DIAGNOSIS — M5442 Lumbago with sciatica, left side: Secondary | ICD-10-CM | POA: Diagnosis not present

## 2018-07-07 DIAGNOSIS — R278 Other lack of coordination: Secondary | ICD-10-CM | POA: Diagnosis not present

## 2018-07-07 DIAGNOSIS — F0390 Unspecified dementia without behavioral disturbance: Secondary | ICD-10-CM | POA: Diagnosis not present

## 2018-07-07 DIAGNOSIS — R443 Hallucinations, unspecified: Secondary | ICD-10-CM | POA: Diagnosis not present

## 2018-07-07 DIAGNOSIS — Z741 Need for assistance with personal care: Secondary | ICD-10-CM | POA: Diagnosis not present

## 2018-07-07 DIAGNOSIS — M6281 Muscle weakness (generalized): Secondary | ICD-10-CM | POA: Diagnosis not present

## 2018-07-07 DIAGNOSIS — G479 Sleep disorder, unspecified: Secondary | ICD-10-CM | POA: Diagnosis not present

## 2018-07-08 DIAGNOSIS — Z741 Need for assistance with personal care: Secondary | ICD-10-CM | POA: Diagnosis not present

## 2018-07-08 DIAGNOSIS — M8088XD Other osteoporosis with current pathological fracture, vertebra(e), subsequent encounter for fracture with routine healing: Secondary | ICD-10-CM | POA: Diagnosis not present

## 2018-07-08 DIAGNOSIS — M6281 Muscle weakness (generalized): Secondary | ICD-10-CM | POA: Diagnosis not present

## 2018-07-08 DIAGNOSIS — F0390 Unspecified dementia without behavioral disturbance: Secondary | ICD-10-CM | POA: Diagnosis not present

## 2018-07-08 DIAGNOSIS — R278 Other lack of coordination: Secondary | ICD-10-CM | POA: Diagnosis not present

## 2018-07-08 DIAGNOSIS — M5442 Lumbago with sciatica, left side: Secondary | ICD-10-CM | POA: Diagnosis not present

## 2018-07-09 DIAGNOSIS — F0390 Unspecified dementia without behavioral disturbance: Secondary | ICD-10-CM | POA: Diagnosis not present

## 2018-07-09 DIAGNOSIS — Z741 Need for assistance with personal care: Secondary | ICD-10-CM | POA: Diagnosis not present

## 2018-07-09 DIAGNOSIS — M5442 Lumbago with sciatica, left side: Secondary | ICD-10-CM | POA: Diagnosis not present

## 2018-07-09 DIAGNOSIS — M8088XD Other osteoporosis with current pathological fracture, vertebra(e), subsequent encounter for fracture with routine healing: Secondary | ICD-10-CM | POA: Diagnosis not present

## 2018-07-09 DIAGNOSIS — M6281 Muscle weakness (generalized): Secondary | ICD-10-CM | POA: Diagnosis not present

## 2018-07-09 DIAGNOSIS — R278 Other lack of coordination: Secondary | ICD-10-CM | POA: Diagnosis not present

## 2018-07-10 DIAGNOSIS — R278 Other lack of coordination: Secondary | ICD-10-CM | POA: Diagnosis not present

## 2018-07-10 DIAGNOSIS — Z741 Need for assistance with personal care: Secondary | ICD-10-CM | POA: Diagnosis not present

## 2018-07-10 DIAGNOSIS — M6281 Muscle weakness (generalized): Secondary | ICD-10-CM | POA: Diagnosis not present

## 2018-07-10 DIAGNOSIS — F0390 Unspecified dementia without behavioral disturbance: Secondary | ICD-10-CM | POA: Diagnosis not present

## 2018-07-10 DIAGNOSIS — M5442 Lumbago with sciatica, left side: Secondary | ICD-10-CM | POA: Diagnosis not present

## 2018-07-10 DIAGNOSIS — M8088XD Other osteoporosis with current pathological fracture, vertebra(e), subsequent encounter for fracture with routine healing: Secondary | ICD-10-CM | POA: Diagnosis not present

## 2018-07-11 DIAGNOSIS — R278 Other lack of coordination: Secondary | ICD-10-CM | POA: Diagnosis not present

## 2018-07-11 DIAGNOSIS — Z741 Need for assistance with personal care: Secondary | ICD-10-CM | POA: Diagnosis not present

## 2018-07-11 DIAGNOSIS — M5442 Lumbago with sciatica, left side: Secondary | ICD-10-CM | POA: Diagnosis not present

## 2018-07-11 DIAGNOSIS — F0390 Unspecified dementia without behavioral disturbance: Secondary | ICD-10-CM | POA: Diagnosis not present

## 2018-07-11 DIAGNOSIS — M8088XD Other osteoporosis with current pathological fracture, vertebra(e), subsequent encounter for fracture with routine healing: Secondary | ICD-10-CM | POA: Diagnosis not present

## 2018-07-11 DIAGNOSIS — M6281 Muscle weakness (generalized): Secondary | ICD-10-CM | POA: Diagnosis not present

## 2018-07-14 DIAGNOSIS — M6281 Muscle weakness (generalized): Secondary | ICD-10-CM | POA: Diagnosis not present

## 2018-07-14 DIAGNOSIS — R278 Other lack of coordination: Secondary | ICD-10-CM | POA: Diagnosis not present

## 2018-07-14 DIAGNOSIS — M8088XD Other osteoporosis with current pathological fracture, vertebra(e), subsequent encounter for fracture with routine healing: Secondary | ICD-10-CM | POA: Diagnosis not present

## 2018-07-14 DIAGNOSIS — F0390 Unspecified dementia without behavioral disturbance: Secondary | ICD-10-CM | POA: Diagnosis not present

## 2018-07-14 DIAGNOSIS — M5442 Lumbago with sciatica, left side: Secondary | ICD-10-CM | POA: Diagnosis not present

## 2018-07-14 DIAGNOSIS — Z741 Need for assistance with personal care: Secondary | ICD-10-CM | POA: Diagnosis not present

## 2018-07-15 DIAGNOSIS — M6281 Muscle weakness (generalized): Secondary | ICD-10-CM | POA: Diagnosis not present

## 2018-07-15 DIAGNOSIS — G479 Sleep disorder, unspecified: Secondary | ICD-10-CM

## 2018-07-15 DIAGNOSIS — F0390 Unspecified dementia without behavioral disturbance: Secondary | ICD-10-CM | POA: Diagnosis not present

## 2018-07-15 DIAGNOSIS — M5442 Lumbago with sciatica, left side: Secondary | ICD-10-CM | POA: Diagnosis not present

## 2018-07-15 DIAGNOSIS — R278 Other lack of coordination: Secondary | ICD-10-CM | POA: Diagnosis not present

## 2018-07-15 DIAGNOSIS — F0150 Vascular dementia without behavioral disturbance: Secondary | ICD-10-CM

## 2018-07-15 DIAGNOSIS — S32000A Wedge compression fracture of unspecified lumbar vertebra, initial encounter for closed fracture: Secondary | ICD-10-CM

## 2018-07-15 DIAGNOSIS — M8088XD Other osteoporosis with current pathological fracture, vertebra(e), subsequent encounter for fracture with routine healing: Secondary | ICD-10-CM | POA: Diagnosis not present

## 2018-07-15 DIAGNOSIS — Z741 Need for assistance with personal care: Secondary | ICD-10-CM | POA: Diagnosis not present

## 2018-07-15 DIAGNOSIS — K219 Gastro-esophageal reflux disease without esophagitis: Secondary | ICD-10-CM

## 2018-07-15 DIAGNOSIS — M159 Polyosteoarthritis, unspecified: Secondary | ICD-10-CM

## 2018-07-16 DIAGNOSIS — R278 Other lack of coordination: Secondary | ICD-10-CM | POA: Diagnosis not present

## 2018-07-16 DIAGNOSIS — M6281 Muscle weakness (generalized): Secondary | ICD-10-CM | POA: Diagnosis not present

## 2018-07-16 DIAGNOSIS — M5442 Lumbago with sciatica, left side: Secondary | ICD-10-CM | POA: Diagnosis not present

## 2018-07-16 DIAGNOSIS — M8088XD Other osteoporosis with current pathological fracture, vertebra(e), subsequent encounter for fracture with routine healing: Secondary | ICD-10-CM | POA: Diagnosis not present

## 2018-07-16 DIAGNOSIS — F0390 Unspecified dementia without behavioral disturbance: Secondary | ICD-10-CM | POA: Diagnosis not present

## 2018-07-16 DIAGNOSIS — Z741 Need for assistance with personal care: Secondary | ICD-10-CM | POA: Diagnosis not present

## 2018-07-17 DIAGNOSIS — D649 Anemia, unspecified: Secondary | ICD-10-CM | POA: Diagnosis not present

## 2018-07-17 DIAGNOSIS — M8088XD Other osteoporosis with current pathological fracture, vertebra(e), subsequent encounter for fracture with routine healing: Secondary | ICD-10-CM | POA: Diagnosis not present

## 2018-07-17 DIAGNOSIS — M6281 Muscle weakness (generalized): Secondary | ICD-10-CM | POA: Diagnosis not present

## 2018-07-17 DIAGNOSIS — F0390 Unspecified dementia without behavioral disturbance: Secondary | ICD-10-CM | POA: Diagnosis not present

## 2018-07-17 DIAGNOSIS — Z741 Need for assistance with personal care: Secondary | ICD-10-CM | POA: Diagnosis not present

## 2018-07-17 DIAGNOSIS — M5442 Lumbago with sciatica, left side: Secondary | ICD-10-CM | POA: Diagnosis not present

## 2018-07-17 DIAGNOSIS — R278 Other lack of coordination: Secondary | ICD-10-CM | POA: Diagnosis not present

## 2018-07-18 DIAGNOSIS — R278 Other lack of coordination: Secondary | ICD-10-CM | POA: Diagnosis not present

## 2018-07-18 DIAGNOSIS — M6281 Muscle weakness (generalized): Secondary | ICD-10-CM | POA: Diagnosis not present

## 2018-07-18 DIAGNOSIS — Z741 Need for assistance with personal care: Secondary | ICD-10-CM | POA: Diagnosis not present

## 2018-07-18 DIAGNOSIS — M8088XD Other osteoporosis with current pathological fracture, vertebra(e), subsequent encounter for fracture with routine healing: Secondary | ICD-10-CM | POA: Diagnosis not present

## 2018-07-18 DIAGNOSIS — F0390 Unspecified dementia without behavioral disturbance: Secondary | ICD-10-CM | POA: Diagnosis not present

## 2018-07-18 DIAGNOSIS — M5442 Lumbago with sciatica, left side: Secondary | ICD-10-CM | POA: Diagnosis not present

## 2018-07-21 DIAGNOSIS — M8088XD Other osteoporosis with current pathological fracture, vertebra(e), subsequent encounter for fracture with routine healing: Secondary | ICD-10-CM | POA: Diagnosis not present

## 2018-07-21 DIAGNOSIS — M6281 Muscle weakness (generalized): Secondary | ICD-10-CM | POA: Diagnosis not present

## 2018-07-21 DIAGNOSIS — K219 Gastro-esophageal reflux disease without esophagitis: Secondary | ICD-10-CM | POA: Diagnosis not present

## 2018-07-21 DIAGNOSIS — G4709 Other insomnia: Secondary | ICD-10-CM | POA: Diagnosis not present

## 2018-07-21 DIAGNOSIS — R2689 Other abnormalities of gait and mobility: Secondary | ICD-10-CM | POA: Diagnosis not present

## 2018-07-21 DIAGNOSIS — F0390 Unspecified dementia without behavioral disturbance: Secondary | ICD-10-CM | POA: Diagnosis not present

## 2018-07-21 DIAGNOSIS — Z741 Need for assistance with personal care: Secondary | ICD-10-CM | POA: Diagnosis not present

## 2018-07-21 DIAGNOSIS — M5442 Lumbago with sciatica, left side: Secondary | ICD-10-CM | POA: Diagnosis not present

## 2018-07-21 DIAGNOSIS — S32008D Other fracture of unspecified lumbar vertebra, subsequent encounter for fracture with routine healing: Secondary | ICD-10-CM | POA: Diagnosis not present

## 2018-07-21 DIAGNOSIS — R278 Other lack of coordination: Secondary | ICD-10-CM | POA: Diagnosis not present

## 2018-07-22 DIAGNOSIS — M8088XD Other osteoporosis with current pathological fracture, vertebra(e), subsequent encounter for fracture with routine healing: Secondary | ICD-10-CM | POA: Diagnosis not present

## 2018-07-22 DIAGNOSIS — M6281 Muscle weakness (generalized): Secondary | ICD-10-CM | POA: Diagnosis not present

## 2018-07-22 DIAGNOSIS — F0390 Unspecified dementia without behavioral disturbance: Secondary | ICD-10-CM | POA: Diagnosis not present

## 2018-07-22 DIAGNOSIS — K219 Gastro-esophageal reflux disease without esophagitis: Secondary | ICD-10-CM | POA: Diagnosis not present

## 2018-07-22 DIAGNOSIS — G4709 Other insomnia: Secondary | ICD-10-CM | POA: Diagnosis not present

## 2018-07-22 DIAGNOSIS — M5442 Lumbago with sciatica, left side: Secondary | ICD-10-CM | POA: Diagnosis not present

## 2018-07-23 DIAGNOSIS — F0390 Unspecified dementia without behavioral disturbance: Secondary | ICD-10-CM | POA: Diagnosis not present

## 2018-07-23 DIAGNOSIS — M6281 Muscle weakness (generalized): Secondary | ICD-10-CM | POA: Diagnosis not present

## 2018-07-23 DIAGNOSIS — K219 Gastro-esophageal reflux disease without esophagitis: Secondary | ICD-10-CM | POA: Diagnosis not present

## 2018-07-23 DIAGNOSIS — G4709 Other insomnia: Secondary | ICD-10-CM | POA: Diagnosis not present

## 2018-07-23 DIAGNOSIS — M8088XD Other osteoporosis with current pathological fracture, vertebra(e), subsequent encounter for fracture with routine healing: Secondary | ICD-10-CM | POA: Diagnosis not present

## 2018-07-23 DIAGNOSIS — M5442 Lumbago with sciatica, left side: Secondary | ICD-10-CM | POA: Diagnosis not present

## 2018-07-24 DIAGNOSIS — M5442 Lumbago with sciatica, left side: Secondary | ICD-10-CM | POA: Diagnosis not present

## 2018-07-24 DIAGNOSIS — F0390 Unspecified dementia without behavioral disturbance: Secondary | ICD-10-CM | POA: Diagnosis not present

## 2018-07-24 DIAGNOSIS — M6281 Muscle weakness (generalized): Secondary | ICD-10-CM | POA: Diagnosis not present

## 2018-07-24 DIAGNOSIS — M8088XD Other osteoporosis with current pathological fracture, vertebra(e), subsequent encounter for fracture with routine healing: Secondary | ICD-10-CM | POA: Diagnosis not present

## 2018-07-24 DIAGNOSIS — K219 Gastro-esophageal reflux disease without esophagitis: Secondary | ICD-10-CM | POA: Diagnosis not present

## 2018-07-24 DIAGNOSIS — G4709 Other insomnia: Secondary | ICD-10-CM | POA: Diagnosis not present

## 2018-07-25 DIAGNOSIS — G4709 Other insomnia: Secondary | ICD-10-CM | POA: Diagnosis not present

## 2018-07-25 DIAGNOSIS — K219 Gastro-esophageal reflux disease without esophagitis: Secondary | ICD-10-CM | POA: Diagnosis not present

## 2018-07-25 DIAGNOSIS — M6281 Muscle weakness (generalized): Secondary | ICD-10-CM | POA: Diagnosis not present

## 2018-07-25 DIAGNOSIS — M8088XD Other osteoporosis with current pathological fracture, vertebra(e), subsequent encounter for fracture with routine healing: Secondary | ICD-10-CM | POA: Diagnosis not present

## 2018-07-25 DIAGNOSIS — F0390 Unspecified dementia without behavioral disturbance: Secondary | ICD-10-CM | POA: Diagnosis not present

## 2018-07-25 DIAGNOSIS — M5442 Lumbago with sciatica, left side: Secondary | ICD-10-CM | POA: Diagnosis not present

## 2018-07-28 DIAGNOSIS — M5442 Lumbago with sciatica, left side: Secondary | ICD-10-CM | POA: Diagnosis not present

## 2018-07-28 DIAGNOSIS — K219 Gastro-esophageal reflux disease without esophagitis: Secondary | ICD-10-CM | POA: Diagnosis not present

## 2018-07-28 DIAGNOSIS — M6281 Muscle weakness (generalized): Secondary | ICD-10-CM | POA: Diagnosis not present

## 2018-07-28 DIAGNOSIS — F0390 Unspecified dementia without behavioral disturbance: Secondary | ICD-10-CM | POA: Diagnosis not present

## 2018-07-28 DIAGNOSIS — M8088XD Other osteoporosis with current pathological fracture, vertebra(e), subsequent encounter for fracture with routine healing: Secondary | ICD-10-CM | POA: Diagnosis not present

## 2018-07-28 DIAGNOSIS — G4709 Other insomnia: Secondary | ICD-10-CM | POA: Diagnosis not present

## 2018-07-29 DIAGNOSIS — M6281 Muscle weakness (generalized): Secondary | ICD-10-CM | POA: Diagnosis not present

## 2018-07-29 DIAGNOSIS — M8088XD Other osteoporosis with current pathological fracture, vertebra(e), subsequent encounter for fracture with routine healing: Secondary | ICD-10-CM | POA: Diagnosis not present

## 2018-07-29 DIAGNOSIS — S32000A Wedge compression fracture of unspecified lumbar vertebra, initial encounter for closed fracture: Secondary | ICD-10-CM | POA: Diagnosis not present

## 2018-07-29 DIAGNOSIS — G4709 Other insomnia: Secondary | ICD-10-CM | POA: Diagnosis not present

## 2018-07-29 DIAGNOSIS — K219 Gastro-esophageal reflux disease without esophagitis: Secondary | ICD-10-CM | POA: Diagnosis not present

## 2018-07-29 DIAGNOSIS — M5442 Lumbago with sciatica, left side: Secondary | ICD-10-CM | POA: Diagnosis not present

## 2018-07-29 DIAGNOSIS — G479 Sleep disorder, unspecified: Secondary | ICD-10-CM | POA: Diagnosis not present

## 2018-07-29 DIAGNOSIS — F0150 Vascular dementia without behavioral disturbance: Secondary | ICD-10-CM | POA: Diagnosis not present

## 2018-07-29 DIAGNOSIS — F0390 Unspecified dementia without behavioral disturbance: Secondary | ICD-10-CM | POA: Diagnosis not present

## 2018-07-30 DIAGNOSIS — F0390 Unspecified dementia without behavioral disturbance: Secondary | ICD-10-CM | POA: Diagnosis not present

## 2018-07-30 DIAGNOSIS — G4709 Other insomnia: Secondary | ICD-10-CM | POA: Diagnosis not present

## 2018-07-30 DIAGNOSIS — M5442 Lumbago with sciatica, left side: Secondary | ICD-10-CM | POA: Diagnosis not present

## 2018-07-30 DIAGNOSIS — M6281 Muscle weakness (generalized): Secondary | ICD-10-CM | POA: Diagnosis not present

## 2018-07-30 DIAGNOSIS — M8088XD Other osteoporosis with current pathological fracture, vertebra(e), subsequent encounter for fracture with routine healing: Secondary | ICD-10-CM | POA: Diagnosis not present

## 2018-07-30 DIAGNOSIS — K219 Gastro-esophageal reflux disease without esophagitis: Secondary | ICD-10-CM | POA: Diagnosis not present

## 2018-07-31 DIAGNOSIS — M6281 Muscle weakness (generalized): Secondary | ICD-10-CM | POA: Diagnosis not present

## 2018-07-31 DIAGNOSIS — F0390 Unspecified dementia without behavioral disturbance: Secondary | ICD-10-CM | POA: Diagnosis not present

## 2018-07-31 DIAGNOSIS — G4709 Other insomnia: Secondary | ICD-10-CM | POA: Diagnosis not present

## 2018-07-31 DIAGNOSIS — K219 Gastro-esophageal reflux disease without esophagitis: Secondary | ICD-10-CM | POA: Diagnosis not present

## 2018-07-31 DIAGNOSIS — M5442 Lumbago with sciatica, left side: Secondary | ICD-10-CM | POA: Diagnosis not present

## 2018-07-31 DIAGNOSIS — M8088XD Other osteoporosis with current pathological fracture, vertebra(e), subsequent encounter for fracture with routine healing: Secondary | ICD-10-CM | POA: Diagnosis not present

## 2018-08-01 DIAGNOSIS — M6281 Muscle weakness (generalized): Secondary | ICD-10-CM | POA: Diagnosis not present

## 2018-08-01 DIAGNOSIS — M5442 Lumbago with sciatica, left side: Secondary | ICD-10-CM | POA: Diagnosis not present

## 2018-08-01 DIAGNOSIS — F0390 Unspecified dementia without behavioral disturbance: Secondary | ICD-10-CM | POA: Diagnosis not present

## 2018-08-01 DIAGNOSIS — M8088XD Other osteoporosis with current pathological fracture, vertebra(e), subsequent encounter for fracture with routine healing: Secondary | ICD-10-CM | POA: Diagnosis not present

## 2018-08-01 DIAGNOSIS — K219 Gastro-esophageal reflux disease without esophagitis: Secondary | ICD-10-CM | POA: Diagnosis not present

## 2018-08-01 DIAGNOSIS — G4709 Other insomnia: Secondary | ICD-10-CM | POA: Diagnosis not present

## 2018-08-04 DIAGNOSIS — F0390 Unspecified dementia without behavioral disturbance: Secondary | ICD-10-CM | POA: Diagnosis not present

## 2018-08-04 DIAGNOSIS — G4709 Other insomnia: Secondary | ICD-10-CM | POA: Diagnosis not present

## 2018-08-04 DIAGNOSIS — K219 Gastro-esophageal reflux disease without esophagitis: Secondary | ICD-10-CM | POA: Diagnosis not present

## 2018-08-04 DIAGNOSIS — M5442 Lumbago with sciatica, left side: Secondary | ICD-10-CM | POA: Diagnosis not present

## 2018-08-04 DIAGNOSIS — M8088XD Other osteoporosis with current pathological fracture, vertebra(e), subsequent encounter for fracture with routine healing: Secondary | ICD-10-CM | POA: Diagnosis not present

## 2018-08-04 DIAGNOSIS — M6281 Muscle weakness (generalized): Secondary | ICD-10-CM | POA: Diagnosis not present

## 2018-08-05 DIAGNOSIS — F0390 Unspecified dementia without behavioral disturbance: Secondary | ICD-10-CM | POA: Diagnosis not present

## 2018-08-05 DIAGNOSIS — M5442 Lumbago with sciatica, left side: Secondary | ICD-10-CM | POA: Diagnosis not present

## 2018-08-05 DIAGNOSIS — M8088XD Other osteoporosis with current pathological fracture, vertebra(e), subsequent encounter for fracture with routine healing: Secondary | ICD-10-CM | POA: Diagnosis not present

## 2018-08-05 DIAGNOSIS — G4709 Other insomnia: Secondary | ICD-10-CM | POA: Diagnosis not present

## 2018-08-05 DIAGNOSIS — M545 Low back pain: Secondary | ICD-10-CM

## 2018-08-05 DIAGNOSIS — K219 Gastro-esophageal reflux disease without esophagitis: Secondary | ICD-10-CM | POA: Diagnosis not present

## 2018-08-05 DIAGNOSIS — M6281 Muscle weakness (generalized): Secondary | ICD-10-CM | POA: Diagnosis not present

## 2018-08-06 DIAGNOSIS — M8088XD Other osteoporosis with current pathological fracture, vertebra(e), subsequent encounter for fracture with routine healing: Secondary | ICD-10-CM | POA: Diagnosis not present

## 2018-08-06 DIAGNOSIS — K219 Gastro-esophageal reflux disease without esophagitis: Secondary | ICD-10-CM | POA: Diagnosis not present

## 2018-08-06 DIAGNOSIS — F0390 Unspecified dementia without behavioral disturbance: Secondary | ICD-10-CM | POA: Diagnosis not present

## 2018-08-06 DIAGNOSIS — M5442 Lumbago with sciatica, left side: Secondary | ICD-10-CM | POA: Diagnosis not present

## 2018-08-06 DIAGNOSIS — M6281 Muscle weakness (generalized): Secondary | ICD-10-CM | POA: Diagnosis not present

## 2018-08-06 DIAGNOSIS — G4709 Other insomnia: Secondary | ICD-10-CM | POA: Diagnosis not present

## 2018-08-07 DIAGNOSIS — F0390 Unspecified dementia without behavioral disturbance: Secondary | ICD-10-CM | POA: Diagnosis not present

## 2018-08-07 DIAGNOSIS — K219 Gastro-esophageal reflux disease without esophagitis: Secondary | ICD-10-CM | POA: Diagnosis not present

## 2018-08-07 DIAGNOSIS — M8088XD Other osteoporosis with current pathological fracture, vertebra(e), subsequent encounter for fracture with routine healing: Secondary | ICD-10-CM | POA: Diagnosis not present

## 2018-08-07 DIAGNOSIS — M5442 Lumbago with sciatica, left side: Secondary | ICD-10-CM | POA: Diagnosis not present

## 2018-08-07 DIAGNOSIS — M6281 Muscle weakness (generalized): Secondary | ICD-10-CM | POA: Diagnosis not present

## 2018-08-07 DIAGNOSIS — G4709 Other insomnia: Secondary | ICD-10-CM | POA: Diagnosis not present

## 2018-08-08 DIAGNOSIS — G4709 Other insomnia: Secondary | ICD-10-CM | POA: Diagnosis not present

## 2018-08-08 DIAGNOSIS — M6281 Muscle weakness (generalized): Secondary | ICD-10-CM | POA: Diagnosis not present

## 2018-08-08 DIAGNOSIS — K219 Gastro-esophageal reflux disease without esophagitis: Secondary | ICD-10-CM | POA: Diagnosis not present

## 2018-08-08 DIAGNOSIS — F0390 Unspecified dementia without behavioral disturbance: Secondary | ICD-10-CM | POA: Diagnosis not present

## 2018-08-08 DIAGNOSIS — M8088XD Other osteoporosis with current pathological fracture, vertebra(e), subsequent encounter for fracture with routine healing: Secondary | ICD-10-CM | POA: Diagnosis not present

## 2018-08-08 DIAGNOSIS — M5442 Lumbago with sciatica, left side: Secondary | ICD-10-CM | POA: Diagnosis not present

## 2018-08-11 DIAGNOSIS — F0390 Unspecified dementia without behavioral disturbance: Secondary | ICD-10-CM | POA: Diagnosis not present

## 2018-08-11 DIAGNOSIS — K219 Gastro-esophageal reflux disease without esophagitis: Secondary | ICD-10-CM | POA: Diagnosis not present

## 2018-08-11 DIAGNOSIS — M8088XD Other osteoporosis with current pathological fracture, vertebra(e), subsequent encounter for fracture with routine healing: Secondary | ICD-10-CM | POA: Diagnosis not present

## 2018-08-11 DIAGNOSIS — M6281 Muscle weakness (generalized): Secondary | ICD-10-CM | POA: Diagnosis not present

## 2018-08-11 DIAGNOSIS — G4709 Other insomnia: Secondary | ICD-10-CM | POA: Diagnosis not present

## 2018-08-11 DIAGNOSIS — M5442 Lumbago with sciatica, left side: Secondary | ICD-10-CM | POA: Diagnosis not present

## 2018-08-12 DIAGNOSIS — F0390 Unspecified dementia without behavioral disturbance: Secondary | ICD-10-CM | POA: Diagnosis not present

## 2018-08-12 DIAGNOSIS — M5442 Lumbago with sciatica, left side: Secondary | ICD-10-CM | POA: Diagnosis not present

## 2018-08-12 DIAGNOSIS — M545 Low back pain: Secondary | ICD-10-CM

## 2018-08-12 DIAGNOSIS — M6281 Muscle weakness (generalized): Secondary | ICD-10-CM | POA: Diagnosis not present

## 2018-08-12 DIAGNOSIS — M8088XD Other osteoporosis with current pathological fracture, vertebra(e), subsequent encounter for fracture with routine healing: Secondary | ICD-10-CM | POA: Diagnosis not present

## 2018-08-12 DIAGNOSIS — G4709 Other insomnia: Secondary | ICD-10-CM | POA: Diagnosis not present

## 2018-08-12 DIAGNOSIS — K219 Gastro-esophageal reflux disease without esophagitis: Secondary | ICD-10-CM | POA: Diagnosis not present

## 2018-08-13 DIAGNOSIS — G4709 Other insomnia: Secondary | ICD-10-CM | POA: Diagnosis not present

## 2018-08-13 DIAGNOSIS — F0390 Unspecified dementia without behavioral disturbance: Secondary | ICD-10-CM | POA: Diagnosis not present

## 2018-08-13 DIAGNOSIS — M6281 Muscle weakness (generalized): Secondary | ICD-10-CM | POA: Diagnosis not present

## 2018-08-13 DIAGNOSIS — M8088XD Other osteoporosis with current pathological fracture, vertebra(e), subsequent encounter for fracture with routine healing: Secondary | ICD-10-CM | POA: Diagnosis not present

## 2018-08-13 DIAGNOSIS — K219 Gastro-esophageal reflux disease without esophagitis: Secondary | ICD-10-CM | POA: Diagnosis not present

## 2018-08-13 DIAGNOSIS — M5442 Lumbago with sciatica, left side: Secondary | ICD-10-CM | POA: Diagnosis not present

## 2018-08-14 DIAGNOSIS — M5442 Lumbago with sciatica, left side: Secondary | ICD-10-CM | POA: Diagnosis not present

## 2018-08-14 DIAGNOSIS — K219 Gastro-esophageal reflux disease without esophagitis: Secondary | ICD-10-CM | POA: Diagnosis not present

## 2018-08-14 DIAGNOSIS — M6281 Muscle weakness (generalized): Secondary | ICD-10-CM | POA: Diagnosis not present

## 2018-08-14 DIAGNOSIS — F0390 Unspecified dementia without behavioral disturbance: Secondary | ICD-10-CM | POA: Diagnosis not present

## 2018-08-14 DIAGNOSIS — G4709 Other insomnia: Secondary | ICD-10-CM | POA: Diagnosis not present

## 2018-08-14 DIAGNOSIS — M8088XD Other osteoporosis with current pathological fracture, vertebra(e), subsequent encounter for fracture with routine healing: Secondary | ICD-10-CM | POA: Diagnosis not present

## 2018-08-15 DIAGNOSIS — M5442 Lumbago with sciatica, left side: Secondary | ICD-10-CM | POA: Diagnosis not present

## 2018-08-15 DIAGNOSIS — G4709 Other insomnia: Secondary | ICD-10-CM | POA: Diagnosis not present

## 2018-08-15 DIAGNOSIS — M6281 Muscle weakness (generalized): Secondary | ICD-10-CM | POA: Diagnosis not present

## 2018-08-15 DIAGNOSIS — K219 Gastro-esophageal reflux disease without esophagitis: Secondary | ICD-10-CM | POA: Diagnosis not present

## 2018-08-15 DIAGNOSIS — F0390 Unspecified dementia without behavioral disturbance: Secondary | ICD-10-CM | POA: Diagnosis not present

## 2018-08-15 DIAGNOSIS — M8088XD Other osteoporosis with current pathological fracture, vertebra(e), subsequent encounter for fracture with routine healing: Secondary | ICD-10-CM | POA: Diagnosis not present

## 2018-08-18 DIAGNOSIS — F0390 Unspecified dementia without behavioral disturbance: Secondary | ICD-10-CM | POA: Diagnosis not present

## 2018-08-18 DIAGNOSIS — G4709 Other insomnia: Secondary | ICD-10-CM | POA: Diagnosis not present

## 2018-08-18 DIAGNOSIS — M5442 Lumbago with sciatica, left side: Secondary | ICD-10-CM | POA: Diagnosis not present

## 2018-08-18 DIAGNOSIS — M6281 Muscle weakness (generalized): Secondary | ICD-10-CM | POA: Diagnosis not present

## 2018-08-18 DIAGNOSIS — K219 Gastro-esophageal reflux disease without esophagitis: Secondary | ICD-10-CM | POA: Diagnosis not present

## 2018-08-18 DIAGNOSIS — M8088XD Other osteoporosis with current pathological fracture, vertebra(e), subsequent encounter for fracture with routine healing: Secondary | ICD-10-CM | POA: Diagnosis not present

## 2018-08-19 DIAGNOSIS — K219 Gastro-esophageal reflux disease without esophagitis: Secondary | ICD-10-CM | POA: Diagnosis not present

## 2018-08-19 DIAGNOSIS — F0390 Unspecified dementia without behavioral disturbance: Secondary | ICD-10-CM | POA: Diagnosis not present

## 2018-08-19 DIAGNOSIS — M6281 Muscle weakness (generalized): Secondary | ICD-10-CM | POA: Diagnosis not present

## 2018-08-19 DIAGNOSIS — M8088XD Other osteoporosis with current pathological fracture, vertebra(e), subsequent encounter for fracture with routine healing: Secondary | ICD-10-CM | POA: Diagnosis not present

## 2018-08-19 DIAGNOSIS — G4709 Other insomnia: Secondary | ICD-10-CM | POA: Diagnosis not present

## 2018-08-19 DIAGNOSIS — M5442 Lumbago with sciatica, left side: Secondary | ICD-10-CM | POA: Diagnosis not present

## 2018-08-20 ENCOUNTER — Other Ambulatory Visit: Payer: Medicare Other

## 2018-09-01 DIAGNOSIS — R296 Repeated falls: Secondary | ICD-10-CM | POA: Diagnosis not present

## 2018-09-01 DIAGNOSIS — S32008D Other fracture of unspecified lumbar vertebra, subsequent encounter for fracture with routine healing: Secondary | ICD-10-CM | POA: Diagnosis not present

## 2018-09-01 DIAGNOSIS — R2689 Other abnormalities of gait and mobility: Secondary | ICD-10-CM | POA: Diagnosis not present

## 2018-09-01 DIAGNOSIS — M5442 Lumbago with sciatica, left side: Secondary | ICD-10-CM | POA: Diagnosis not present

## 2018-09-01 DIAGNOSIS — M6281 Muscle weakness (generalized): Secondary | ICD-10-CM | POA: Diagnosis not present

## 2018-09-01 DIAGNOSIS — R278 Other lack of coordination: Secondary | ICD-10-CM | POA: Diagnosis not present

## 2018-09-02 DIAGNOSIS — R2689 Other abnormalities of gait and mobility: Secondary | ICD-10-CM | POA: Diagnosis not present

## 2018-09-02 DIAGNOSIS — M6281 Muscle weakness (generalized): Secondary | ICD-10-CM | POA: Diagnosis not present

## 2018-09-02 DIAGNOSIS — M5442 Lumbago with sciatica, left side: Secondary | ICD-10-CM | POA: Diagnosis not present

## 2018-09-02 DIAGNOSIS — S32008D Other fracture of unspecified lumbar vertebra, subsequent encounter for fracture with routine healing: Secondary | ICD-10-CM | POA: Diagnosis not present

## 2018-09-02 DIAGNOSIS — R278 Other lack of coordination: Secondary | ICD-10-CM | POA: Diagnosis not present

## 2018-09-02 DIAGNOSIS — R296 Repeated falls: Secondary | ICD-10-CM | POA: Diagnosis not present

## 2018-09-03 DIAGNOSIS — H26491 Other secondary cataract, right eye: Secondary | ICD-10-CM | POA: Diagnosis not present

## 2018-09-04 DIAGNOSIS — M5442 Lumbago with sciatica, left side: Secondary | ICD-10-CM | POA: Diagnosis not present

## 2018-09-04 DIAGNOSIS — R278 Other lack of coordination: Secondary | ICD-10-CM | POA: Diagnosis not present

## 2018-09-04 DIAGNOSIS — S32008D Other fracture of unspecified lumbar vertebra, subsequent encounter for fracture with routine healing: Secondary | ICD-10-CM | POA: Diagnosis not present

## 2018-09-04 DIAGNOSIS — R2689 Other abnormalities of gait and mobility: Secondary | ICD-10-CM | POA: Diagnosis not present

## 2018-09-04 DIAGNOSIS — R296 Repeated falls: Secondary | ICD-10-CM | POA: Diagnosis not present

## 2018-09-04 DIAGNOSIS — M6281 Muscle weakness (generalized): Secondary | ICD-10-CM | POA: Diagnosis not present

## 2018-09-05 DIAGNOSIS — R2689 Other abnormalities of gait and mobility: Secondary | ICD-10-CM | POA: Diagnosis not present

## 2018-09-05 DIAGNOSIS — R278 Other lack of coordination: Secondary | ICD-10-CM | POA: Diagnosis not present

## 2018-09-05 DIAGNOSIS — S32008D Other fracture of unspecified lumbar vertebra, subsequent encounter for fracture with routine healing: Secondary | ICD-10-CM | POA: Diagnosis not present

## 2018-09-05 DIAGNOSIS — M6281 Muscle weakness (generalized): Secondary | ICD-10-CM | POA: Diagnosis not present

## 2018-09-05 DIAGNOSIS — R296 Repeated falls: Secondary | ICD-10-CM | POA: Diagnosis not present

## 2018-09-05 DIAGNOSIS — M5442 Lumbago with sciatica, left side: Secondary | ICD-10-CM | POA: Diagnosis not present

## 2018-09-08 DIAGNOSIS — R278 Other lack of coordination: Secondary | ICD-10-CM | POA: Diagnosis not present

## 2018-09-08 DIAGNOSIS — R296 Repeated falls: Secondary | ICD-10-CM | POA: Diagnosis not present

## 2018-09-08 DIAGNOSIS — S32008D Other fracture of unspecified lumbar vertebra, subsequent encounter for fracture with routine healing: Secondary | ICD-10-CM | POA: Diagnosis not present

## 2018-09-08 DIAGNOSIS — R2689 Other abnormalities of gait and mobility: Secondary | ICD-10-CM | POA: Diagnosis not present

## 2018-09-08 DIAGNOSIS — M6281 Muscle weakness (generalized): Secondary | ICD-10-CM | POA: Diagnosis not present

## 2018-09-08 DIAGNOSIS — M5442 Lumbago with sciatica, left side: Secondary | ICD-10-CM | POA: Diagnosis not present

## 2018-09-09 DIAGNOSIS — R278 Other lack of coordination: Secondary | ICD-10-CM | POA: Diagnosis not present

## 2018-09-09 DIAGNOSIS — M5442 Lumbago with sciatica, left side: Secondary | ICD-10-CM | POA: Diagnosis not present

## 2018-09-09 DIAGNOSIS — R296 Repeated falls: Secondary | ICD-10-CM | POA: Diagnosis not present

## 2018-09-09 DIAGNOSIS — M6281 Muscle weakness (generalized): Secondary | ICD-10-CM | POA: Diagnosis not present

## 2018-09-09 DIAGNOSIS — S32008D Other fracture of unspecified lumbar vertebra, subsequent encounter for fracture with routine healing: Secondary | ICD-10-CM | POA: Diagnosis not present

## 2018-09-09 DIAGNOSIS — R2689 Other abnormalities of gait and mobility: Secondary | ICD-10-CM | POA: Diagnosis not present

## 2018-09-10 DIAGNOSIS — R296 Repeated falls: Secondary | ICD-10-CM | POA: Diagnosis not present

## 2018-09-10 DIAGNOSIS — M6281 Muscle weakness (generalized): Secondary | ICD-10-CM | POA: Diagnosis not present

## 2018-09-10 DIAGNOSIS — S32008D Other fracture of unspecified lumbar vertebra, subsequent encounter for fracture with routine healing: Secondary | ICD-10-CM | POA: Diagnosis not present

## 2018-09-10 DIAGNOSIS — M5442 Lumbago with sciatica, left side: Secondary | ICD-10-CM | POA: Diagnosis not present

## 2018-09-10 DIAGNOSIS — R2689 Other abnormalities of gait and mobility: Secondary | ICD-10-CM | POA: Diagnosis not present

## 2018-09-10 DIAGNOSIS — R278 Other lack of coordination: Secondary | ICD-10-CM | POA: Diagnosis not present

## 2018-09-11 DIAGNOSIS — R278 Other lack of coordination: Secondary | ICD-10-CM | POA: Diagnosis not present

## 2018-09-11 DIAGNOSIS — S32008D Other fracture of unspecified lumbar vertebra, subsequent encounter for fracture with routine healing: Secondary | ICD-10-CM | POA: Diagnosis not present

## 2018-09-11 DIAGNOSIS — R296 Repeated falls: Secondary | ICD-10-CM | POA: Diagnosis not present

## 2018-09-11 DIAGNOSIS — M6281 Muscle weakness (generalized): Secondary | ICD-10-CM | POA: Diagnosis not present

## 2018-09-11 DIAGNOSIS — M5442 Lumbago with sciatica, left side: Secondary | ICD-10-CM | POA: Diagnosis not present

## 2018-09-11 DIAGNOSIS — R2689 Other abnormalities of gait and mobility: Secondary | ICD-10-CM | POA: Diagnosis not present

## 2018-09-12 DIAGNOSIS — M5442 Lumbago with sciatica, left side: Secondary | ICD-10-CM | POA: Diagnosis not present

## 2018-09-12 DIAGNOSIS — M6281 Muscle weakness (generalized): Secondary | ICD-10-CM | POA: Diagnosis not present

## 2018-09-12 DIAGNOSIS — R2689 Other abnormalities of gait and mobility: Secondary | ICD-10-CM | POA: Diagnosis not present

## 2018-09-12 DIAGNOSIS — S32008D Other fracture of unspecified lumbar vertebra, subsequent encounter for fracture with routine healing: Secondary | ICD-10-CM | POA: Diagnosis not present

## 2018-09-12 DIAGNOSIS — R296 Repeated falls: Secondary | ICD-10-CM | POA: Diagnosis not present

## 2018-09-12 DIAGNOSIS — R278 Other lack of coordination: Secondary | ICD-10-CM | POA: Diagnosis not present

## 2018-09-16 DIAGNOSIS — M5442 Lumbago with sciatica, left side: Secondary | ICD-10-CM | POA: Diagnosis not present

## 2018-09-16 DIAGNOSIS — R278 Other lack of coordination: Secondary | ICD-10-CM | POA: Diagnosis not present

## 2018-09-16 DIAGNOSIS — R296 Repeated falls: Secondary | ICD-10-CM | POA: Diagnosis not present

## 2018-09-16 DIAGNOSIS — M6281 Muscle weakness (generalized): Secondary | ICD-10-CM | POA: Diagnosis not present

## 2018-09-16 DIAGNOSIS — R2689 Other abnormalities of gait and mobility: Secondary | ICD-10-CM | POA: Diagnosis not present

## 2018-09-16 DIAGNOSIS — S32008D Other fracture of unspecified lumbar vertebra, subsequent encounter for fracture with routine healing: Secondary | ICD-10-CM | POA: Diagnosis not present

## 2018-09-17 DIAGNOSIS — M6281 Muscle weakness (generalized): Secondary | ICD-10-CM | POA: Diagnosis not present

## 2018-09-17 DIAGNOSIS — R278 Other lack of coordination: Secondary | ICD-10-CM | POA: Diagnosis not present

## 2018-09-17 DIAGNOSIS — S32008D Other fracture of unspecified lumbar vertebra, subsequent encounter for fracture with routine healing: Secondary | ICD-10-CM | POA: Diagnosis not present

## 2018-09-17 DIAGNOSIS — M5442 Lumbago with sciatica, left side: Secondary | ICD-10-CM | POA: Diagnosis not present

## 2018-09-17 DIAGNOSIS — R2689 Other abnormalities of gait and mobility: Secondary | ICD-10-CM | POA: Diagnosis not present

## 2018-09-17 DIAGNOSIS — R296 Repeated falls: Secondary | ICD-10-CM | POA: Diagnosis not present

## 2018-09-18 DIAGNOSIS — R278 Other lack of coordination: Secondary | ICD-10-CM | POA: Diagnosis not present

## 2018-09-18 DIAGNOSIS — M6281 Muscle weakness (generalized): Secondary | ICD-10-CM | POA: Diagnosis not present

## 2018-09-18 DIAGNOSIS — R2689 Other abnormalities of gait and mobility: Secondary | ICD-10-CM | POA: Diagnosis not present

## 2018-09-18 DIAGNOSIS — S32008D Other fracture of unspecified lumbar vertebra, subsequent encounter for fracture with routine healing: Secondary | ICD-10-CM | POA: Diagnosis not present

## 2018-09-18 DIAGNOSIS — R296 Repeated falls: Secondary | ICD-10-CM | POA: Diagnosis not present

## 2018-09-18 DIAGNOSIS — M5442 Lumbago with sciatica, left side: Secondary | ICD-10-CM | POA: Diagnosis not present

## 2018-09-19 DIAGNOSIS — S32008D Other fracture of unspecified lumbar vertebra, subsequent encounter for fracture with routine healing: Secondary | ICD-10-CM | POA: Diagnosis not present

## 2018-09-19 DIAGNOSIS — R278 Other lack of coordination: Secondary | ICD-10-CM | POA: Diagnosis not present

## 2018-09-19 DIAGNOSIS — M6281 Muscle weakness (generalized): Secondary | ICD-10-CM | POA: Diagnosis not present

## 2018-09-19 DIAGNOSIS — M5442 Lumbago with sciatica, left side: Secondary | ICD-10-CM | POA: Diagnosis not present

## 2018-09-19 DIAGNOSIS — R296 Repeated falls: Secondary | ICD-10-CM | POA: Diagnosis not present

## 2018-09-19 DIAGNOSIS — R2689 Other abnormalities of gait and mobility: Secondary | ICD-10-CM | POA: Diagnosis not present

## 2018-09-22 DIAGNOSIS — S32008D Other fracture of unspecified lumbar vertebra, subsequent encounter for fracture with routine healing: Secondary | ICD-10-CM | POA: Diagnosis not present

## 2018-09-22 DIAGNOSIS — R278 Other lack of coordination: Secondary | ICD-10-CM | POA: Diagnosis not present

## 2018-09-22 DIAGNOSIS — M6281 Muscle weakness (generalized): Secondary | ICD-10-CM | POA: Diagnosis not present

## 2018-09-22 DIAGNOSIS — M5442 Lumbago with sciatica, left side: Secondary | ICD-10-CM | POA: Diagnosis not present

## 2018-09-22 DIAGNOSIS — R296 Repeated falls: Secondary | ICD-10-CM | POA: Diagnosis not present

## 2018-09-22 DIAGNOSIS — R2689 Other abnormalities of gait and mobility: Secondary | ICD-10-CM | POA: Diagnosis not present

## 2018-09-26 DIAGNOSIS — R296 Repeated falls: Secondary | ICD-10-CM | POA: Diagnosis not present

## 2018-09-26 DIAGNOSIS — M5442 Lumbago with sciatica, left side: Secondary | ICD-10-CM | POA: Diagnosis not present

## 2018-09-26 DIAGNOSIS — M6281 Muscle weakness (generalized): Secondary | ICD-10-CM | POA: Diagnosis not present

## 2018-09-26 DIAGNOSIS — S32008D Other fracture of unspecified lumbar vertebra, subsequent encounter for fracture with routine healing: Secondary | ICD-10-CM | POA: Diagnosis not present

## 2018-09-26 DIAGNOSIS — R278 Other lack of coordination: Secondary | ICD-10-CM | POA: Diagnosis not present

## 2018-09-26 DIAGNOSIS — R2689 Other abnormalities of gait and mobility: Secondary | ICD-10-CM | POA: Diagnosis not present

## 2018-09-29 DIAGNOSIS — M5442 Lumbago with sciatica, left side: Secondary | ICD-10-CM | POA: Diagnosis not present

## 2018-09-29 DIAGNOSIS — R296 Repeated falls: Secondary | ICD-10-CM | POA: Diagnosis not present

## 2018-09-29 DIAGNOSIS — M6281 Muscle weakness (generalized): Secondary | ICD-10-CM | POA: Diagnosis not present

## 2018-09-29 DIAGNOSIS — S32008D Other fracture of unspecified lumbar vertebra, subsequent encounter for fracture with routine healing: Secondary | ICD-10-CM | POA: Diagnosis not present

## 2018-09-29 DIAGNOSIS — R278 Other lack of coordination: Secondary | ICD-10-CM | POA: Diagnosis not present

## 2018-09-29 DIAGNOSIS — R2689 Other abnormalities of gait and mobility: Secondary | ICD-10-CM | POA: Diagnosis not present

## 2018-10-01 DIAGNOSIS — M6281 Muscle weakness (generalized): Secondary | ICD-10-CM | POA: Diagnosis not present

## 2018-10-01 DIAGNOSIS — R278 Other lack of coordination: Secondary | ICD-10-CM | POA: Diagnosis not present

## 2018-10-01 DIAGNOSIS — S32008D Other fracture of unspecified lumbar vertebra, subsequent encounter for fracture with routine healing: Secondary | ICD-10-CM | POA: Diagnosis not present

## 2018-10-01 DIAGNOSIS — R296 Repeated falls: Secondary | ICD-10-CM | POA: Diagnosis not present

## 2018-10-01 DIAGNOSIS — R2689 Other abnormalities of gait and mobility: Secondary | ICD-10-CM | POA: Diagnosis not present

## 2018-10-01 DIAGNOSIS — M5442 Lumbago with sciatica, left side: Secondary | ICD-10-CM | POA: Diagnosis not present

## 2018-10-03 DIAGNOSIS — M6281 Muscle weakness (generalized): Secondary | ICD-10-CM | POA: Diagnosis not present

## 2018-10-03 DIAGNOSIS — R296 Repeated falls: Secondary | ICD-10-CM | POA: Diagnosis not present

## 2018-10-03 DIAGNOSIS — M5442 Lumbago with sciatica, left side: Secondary | ICD-10-CM | POA: Diagnosis not present

## 2018-10-03 DIAGNOSIS — R278 Other lack of coordination: Secondary | ICD-10-CM | POA: Diagnosis not present

## 2018-10-03 DIAGNOSIS — R2689 Other abnormalities of gait and mobility: Secondary | ICD-10-CM | POA: Diagnosis not present

## 2018-10-03 DIAGNOSIS — S32008D Other fracture of unspecified lumbar vertebra, subsequent encounter for fracture with routine healing: Secondary | ICD-10-CM | POA: Diagnosis not present

## 2018-10-06 DIAGNOSIS — F5101 Primary insomnia: Secondary | ICD-10-CM | POA: Diagnosis not present

## 2018-10-06 DIAGNOSIS — R269 Unspecified abnormalities of gait and mobility: Secondary | ICD-10-CM | POA: Diagnosis not present

## 2018-10-06 DIAGNOSIS — M5442 Lumbago with sciatica, left side: Secondary | ICD-10-CM | POA: Diagnosis not present

## 2018-10-07 DIAGNOSIS — R296 Repeated falls: Secondary | ICD-10-CM | POA: Diagnosis not present

## 2018-10-07 DIAGNOSIS — M6281 Muscle weakness (generalized): Secondary | ICD-10-CM | POA: Diagnosis not present

## 2018-10-07 DIAGNOSIS — M5442 Lumbago with sciatica, left side: Secondary | ICD-10-CM | POA: Diagnosis not present

## 2018-10-07 DIAGNOSIS — S32008D Other fracture of unspecified lumbar vertebra, subsequent encounter for fracture with routine healing: Secondary | ICD-10-CM | POA: Diagnosis not present

## 2018-10-07 DIAGNOSIS — R278 Other lack of coordination: Secondary | ICD-10-CM | POA: Diagnosis not present

## 2018-10-07 DIAGNOSIS — R2689 Other abnormalities of gait and mobility: Secondary | ICD-10-CM | POA: Diagnosis not present

## 2018-10-08 DIAGNOSIS — S32008D Other fracture of unspecified lumbar vertebra, subsequent encounter for fracture with routine healing: Secondary | ICD-10-CM | POA: Diagnosis not present

## 2018-10-08 DIAGNOSIS — R2689 Other abnormalities of gait and mobility: Secondary | ICD-10-CM | POA: Diagnosis not present

## 2018-10-08 DIAGNOSIS — M6281 Muscle weakness (generalized): Secondary | ICD-10-CM | POA: Diagnosis not present

## 2018-10-08 DIAGNOSIS — M5442 Lumbago with sciatica, left side: Secondary | ICD-10-CM | POA: Diagnosis not present

## 2018-10-08 DIAGNOSIS — R278 Other lack of coordination: Secondary | ICD-10-CM | POA: Diagnosis not present

## 2018-10-08 DIAGNOSIS — R296 Repeated falls: Secondary | ICD-10-CM | POA: Diagnosis not present

## 2018-10-10 DIAGNOSIS — R2689 Other abnormalities of gait and mobility: Secondary | ICD-10-CM | POA: Diagnosis not present

## 2018-10-10 DIAGNOSIS — R296 Repeated falls: Secondary | ICD-10-CM | POA: Diagnosis not present

## 2018-10-10 DIAGNOSIS — M5442 Lumbago with sciatica, left side: Secondary | ICD-10-CM | POA: Diagnosis not present

## 2018-10-10 DIAGNOSIS — S32008D Other fracture of unspecified lumbar vertebra, subsequent encounter for fracture with routine healing: Secondary | ICD-10-CM | POA: Diagnosis not present

## 2018-10-10 DIAGNOSIS — R278 Other lack of coordination: Secondary | ICD-10-CM | POA: Diagnosis not present

## 2018-10-10 DIAGNOSIS — M6281 Muscle weakness (generalized): Secondary | ICD-10-CM | POA: Diagnosis not present

## 2018-10-14 DIAGNOSIS — R278 Other lack of coordination: Secondary | ICD-10-CM | POA: Diagnosis not present

## 2018-10-14 DIAGNOSIS — R296 Repeated falls: Secondary | ICD-10-CM | POA: Diagnosis not present

## 2018-10-14 DIAGNOSIS — M5442 Lumbago with sciatica, left side: Secondary | ICD-10-CM | POA: Diagnosis not present

## 2018-10-14 DIAGNOSIS — R2689 Other abnormalities of gait and mobility: Secondary | ICD-10-CM | POA: Diagnosis not present

## 2018-10-14 DIAGNOSIS — S32008D Other fracture of unspecified lumbar vertebra, subsequent encounter for fracture with routine healing: Secondary | ICD-10-CM | POA: Diagnosis not present

## 2018-10-14 DIAGNOSIS — M6281 Muscle weakness (generalized): Secondary | ICD-10-CM | POA: Diagnosis not present

## 2018-10-15 DIAGNOSIS — R2689 Other abnormalities of gait and mobility: Secondary | ICD-10-CM | POA: Diagnosis not present

## 2018-10-15 DIAGNOSIS — R296 Repeated falls: Secondary | ICD-10-CM | POA: Diagnosis not present

## 2018-10-15 DIAGNOSIS — M5442 Lumbago with sciatica, left side: Secondary | ICD-10-CM | POA: Diagnosis not present

## 2018-10-15 DIAGNOSIS — R278 Other lack of coordination: Secondary | ICD-10-CM | POA: Diagnosis not present

## 2018-10-15 DIAGNOSIS — M6281 Muscle weakness (generalized): Secondary | ICD-10-CM | POA: Diagnosis not present

## 2018-10-15 DIAGNOSIS — S32008D Other fracture of unspecified lumbar vertebra, subsequent encounter for fracture with routine healing: Secondary | ICD-10-CM | POA: Diagnosis not present

## 2018-10-20 DIAGNOSIS — R2689 Other abnormalities of gait and mobility: Secondary | ICD-10-CM | POA: Diagnosis not present

## 2018-10-20 DIAGNOSIS — M6281 Muscle weakness (generalized): Secondary | ICD-10-CM | POA: Diagnosis not present

## 2018-10-20 DIAGNOSIS — R278 Other lack of coordination: Secondary | ICD-10-CM | POA: Diagnosis not present

## 2018-10-20 DIAGNOSIS — R296 Repeated falls: Secondary | ICD-10-CM | POA: Diagnosis not present

## 2018-10-20 DIAGNOSIS — S32008D Other fracture of unspecified lumbar vertebra, subsequent encounter for fracture with routine healing: Secondary | ICD-10-CM | POA: Diagnosis not present

## 2018-10-20 DIAGNOSIS — M5442 Lumbago with sciatica, left side: Secondary | ICD-10-CM | POA: Diagnosis not present

## 2018-10-22 DIAGNOSIS — R2689 Other abnormalities of gait and mobility: Secondary | ICD-10-CM | POA: Diagnosis not present

## 2018-10-22 DIAGNOSIS — R296 Repeated falls: Secondary | ICD-10-CM | POA: Diagnosis not present

## 2018-10-22 DIAGNOSIS — M5442 Lumbago with sciatica, left side: Secondary | ICD-10-CM | POA: Diagnosis not present

## 2018-10-22 DIAGNOSIS — S32008D Other fracture of unspecified lumbar vertebra, subsequent encounter for fracture with routine healing: Secondary | ICD-10-CM | POA: Diagnosis not present

## 2018-10-22 DIAGNOSIS — R278 Other lack of coordination: Secondary | ICD-10-CM | POA: Diagnosis not present

## 2018-10-22 DIAGNOSIS — M6281 Muscle weakness (generalized): Secondary | ICD-10-CM | POA: Diagnosis not present

## 2018-10-23 DIAGNOSIS — R2689 Other abnormalities of gait and mobility: Secondary | ICD-10-CM | POA: Diagnosis not present

## 2018-10-23 DIAGNOSIS — S32008D Other fracture of unspecified lumbar vertebra, subsequent encounter for fracture with routine healing: Secondary | ICD-10-CM | POA: Diagnosis not present

## 2018-10-23 DIAGNOSIS — M5442 Lumbago with sciatica, left side: Secondary | ICD-10-CM | POA: Diagnosis not present

## 2018-10-23 DIAGNOSIS — R296 Repeated falls: Secondary | ICD-10-CM | POA: Diagnosis not present

## 2018-10-23 DIAGNOSIS — R278 Other lack of coordination: Secondary | ICD-10-CM | POA: Diagnosis not present

## 2018-10-23 DIAGNOSIS — M6281 Muscle weakness (generalized): Secondary | ICD-10-CM | POA: Diagnosis not present

## 2018-10-27 DIAGNOSIS — S32008D Other fracture of unspecified lumbar vertebra, subsequent encounter for fracture with routine healing: Secondary | ICD-10-CM | POA: Diagnosis not present

## 2018-10-27 DIAGNOSIS — R2689 Other abnormalities of gait and mobility: Secondary | ICD-10-CM | POA: Diagnosis not present

## 2018-10-27 DIAGNOSIS — R296 Repeated falls: Secondary | ICD-10-CM | POA: Diagnosis not present

## 2018-10-27 DIAGNOSIS — R278 Other lack of coordination: Secondary | ICD-10-CM | POA: Diagnosis not present

## 2018-10-27 DIAGNOSIS — M5442 Lumbago with sciatica, left side: Secondary | ICD-10-CM | POA: Diagnosis not present

## 2018-10-27 DIAGNOSIS — M6281 Muscle weakness (generalized): Secondary | ICD-10-CM | POA: Diagnosis not present

## 2018-10-29 DIAGNOSIS — M5442 Lumbago with sciatica, left side: Secondary | ICD-10-CM | POA: Diagnosis not present

## 2018-10-29 DIAGNOSIS — S32008D Other fracture of unspecified lumbar vertebra, subsequent encounter for fracture with routine healing: Secondary | ICD-10-CM | POA: Diagnosis not present

## 2018-10-29 DIAGNOSIS — M6281 Muscle weakness (generalized): Secondary | ICD-10-CM | POA: Diagnosis not present

## 2018-10-29 DIAGNOSIS — R2689 Other abnormalities of gait and mobility: Secondary | ICD-10-CM | POA: Diagnosis not present

## 2018-10-29 DIAGNOSIS — R278 Other lack of coordination: Secondary | ICD-10-CM | POA: Diagnosis not present

## 2018-10-29 DIAGNOSIS — R296 Repeated falls: Secondary | ICD-10-CM | POA: Diagnosis not present

## 2018-10-31 DIAGNOSIS — R278 Other lack of coordination: Secondary | ICD-10-CM | POA: Diagnosis not present

## 2018-10-31 DIAGNOSIS — S32008D Other fracture of unspecified lumbar vertebra, subsequent encounter for fracture with routine healing: Secondary | ICD-10-CM | POA: Diagnosis not present

## 2018-10-31 DIAGNOSIS — M5442 Lumbago with sciatica, left side: Secondary | ICD-10-CM | POA: Diagnosis not present

## 2018-10-31 DIAGNOSIS — R296 Repeated falls: Secondary | ICD-10-CM | POA: Diagnosis not present

## 2018-10-31 DIAGNOSIS — M6281 Muscle weakness (generalized): Secondary | ICD-10-CM | POA: Diagnosis not present

## 2018-10-31 DIAGNOSIS — R2689 Other abnormalities of gait and mobility: Secondary | ICD-10-CM | POA: Diagnosis not present

## 2018-11-03 DIAGNOSIS — S32008D Other fracture of unspecified lumbar vertebra, subsequent encounter for fracture with routine healing: Secondary | ICD-10-CM | POA: Diagnosis not present

## 2018-11-03 DIAGNOSIS — M6281 Muscle weakness (generalized): Secondary | ICD-10-CM | POA: Diagnosis not present

## 2018-11-03 DIAGNOSIS — M5442 Lumbago with sciatica, left side: Secondary | ICD-10-CM | POA: Diagnosis not present

## 2018-11-03 DIAGNOSIS — R296 Repeated falls: Secondary | ICD-10-CM | POA: Diagnosis not present

## 2018-11-03 DIAGNOSIS — R2689 Other abnormalities of gait and mobility: Secondary | ICD-10-CM | POA: Diagnosis not present

## 2018-11-03 DIAGNOSIS — R278 Other lack of coordination: Secondary | ICD-10-CM | POA: Diagnosis not present

## 2018-11-05 DIAGNOSIS — R2689 Other abnormalities of gait and mobility: Secondary | ICD-10-CM | POA: Diagnosis not present

## 2018-11-05 DIAGNOSIS — S32008D Other fracture of unspecified lumbar vertebra, subsequent encounter for fracture with routine healing: Secondary | ICD-10-CM | POA: Diagnosis not present

## 2018-11-05 DIAGNOSIS — R278 Other lack of coordination: Secondary | ICD-10-CM | POA: Diagnosis not present

## 2018-11-05 DIAGNOSIS — R296 Repeated falls: Secondary | ICD-10-CM | POA: Diagnosis not present

## 2018-11-05 DIAGNOSIS — M6281 Muscle weakness (generalized): Secondary | ICD-10-CM | POA: Diagnosis not present

## 2018-11-05 DIAGNOSIS — M5442 Lumbago with sciatica, left side: Secondary | ICD-10-CM | POA: Diagnosis not present

## 2019-03-06 DIAGNOSIS — Z23 Encounter for immunization: Secondary | ICD-10-CM | POA: Diagnosis not present

## 2019-04-03 DIAGNOSIS — Z23 Encounter for immunization: Secondary | ICD-10-CM | POA: Diagnosis not present

## 2019-04-09 ENCOUNTER — Encounter: Payer: Self-pay | Admitting: Emergency Medicine

## 2019-04-09 ENCOUNTER — Ambulatory Visit: Payer: Medicare Other

## 2019-04-09 ENCOUNTER — Inpatient Hospital Stay
Admission: EM | Admit: 2019-04-09 | Discharge: 2019-04-13 | DRG: 478 | Disposition: A | Discharge status: Skilled Nursing Facility | Payer: Medicare Other | Attending: Internal Medicine | Admitting: Internal Medicine

## 2019-04-09 ENCOUNTER — Other Ambulatory Visit: Payer: Self-pay

## 2019-04-09 ENCOUNTER — Emergency Department: Payer: Medicare Other

## 2019-04-09 DIAGNOSIS — M545 Low back pain: Secondary | ICD-10-CM

## 2019-04-09 DIAGNOSIS — M808AXA Other osteoporosis with current pathological fracture, other site, initial encounter for fracture: Secondary | ICD-10-CM | POA: Diagnosis not present

## 2019-04-09 DIAGNOSIS — Z882 Allergy status to sulfonamides status: Secondary | ICD-10-CM

## 2019-04-09 DIAGNOSIS — M4848XA Fatigue fracture of vertebra, sacral and sacrococcygeal region, initial encounter for fracture: Secondary | ICD-10-CM | POA: Diagnosis not present

## 2019-04-09 DIAGNOSIS — M48061 Spinal stenosis, lumbar region without neurogenic claudication: Secondary | ICD-10-CM | POA: Diagnosis present

## 2019-04-09 DIAGNOSIS — M8448XA Pathological fracture, other site, initial encounter for fracture: Secondary | ICD-10-CM

## 2019-04-09 DIAGNOSIS — S3210XA Unspecified fracture of sacrum, initial encounter for closed fracture: Secondary | ICD-10-CM | POA: Diagnosis not present

## 2019-04-09 DIAGNOSIS — Z66 Do not resuscitate: Secondary | ICD-10-CM | POA: Diagnosis present

## 2019-04-09 DIAGNOSIS — Z96611 Presence of right artificial shoulder joint: Secondary | ICD-10-CM | POA: Diagnosis present

## 2019-04-09 DIAGNOSIS — Z7952 Long term (current) use of systemic steroids: Secondary | ICD-10-CM

## 2019-04-09 DIAGNOSIS — Z79899 Other long term (current) drug therapy: Secondary | ICD-10-CM

## 2019-04-09 DIAGNOSIS — I69854 Hemiplegia and hemiparesis following other cerebrovascular disease affecting left non-dominant side: Secondary | ICD-10-CM | POA: Diagnosis not present

## 2019-04-09 DIAGNOSIS — M5489 Other dorsalgia: Secondary | ICD-10-CM | POA: Diagnosis not present

## 2019-04-09 DIAGNOSIS — Z20822 Contact with and (suspected) exposure to covid-19: Secondary | ICD-10-CM | POA: Diagnosis present

## 2019-04-09 DIAGNOSIS — Z8249 Family history of ischemic heart disease and other diseases of the circulatory system: Secondary | ICD-10-CM

## 2019-04-09 DIAGNOSIS — S32010A Wedge compression fracture of first lumbar vertebra, initial encounter for closed fracture: Principal | ICD-10-CM | POA: Diagnosis present

## 2019-04-09 DIAGNOSIS — M4857XA Collapsed vertebra, not elsewhere classified, lumbosacral region, initial encounter for fracture: Secondary | ICD-10-CM

## 2019-04-09 DIAGNOSIS — I351 Nonrheumatic aortic (valve) insufficiency: Secondary | ICD-10-CM | POA: Diagnosis present

## 2019-04-09 DIAGNOSIS — M79605 Pain in left leg: Secondary | ICD-10-CM | POA: Diagnosis not present

## 2019-04-09 DIAGNOSIS — S32000A Wedge compression fracture of unspecified lumbar vertebra, initial encounter for closed fracture: Secondary | ICD-10-CM | POA: Diagnosis present

## 2019-04-09 DIAGNOSIS — D649 Anemia, unspecified: Secondary | ICD-10-CM | POA: Diagnosis present

## 2019-04-09 DIAGNOSIS — K219 Gastro-esophageal reflux disease without esophagitis: Secondary | ICD-10-CM | POA: Diagnosis present

## 2019-04-09 DIAGNOSIS — Z96653 Presence of artificial knee joint, bilateral: Secondary | ICD-10-CM | POA: Diagnosis present

## 2019-04-09 DIAGNOSIS — R52 Pain, unspecified: Secondary | ICD-10-CM | POA: Diagnosis not present

## 2019-04-09 DIAGNOSIS — M5459 Other low back pain: Secondary | ICD-10-CM

## 2019-04-09 DIAGNOSIS — Z419 Encounter for procedure for purposes other than remedying health state, unspecified: Secondary | ICD-10-CM

## 2019-04-09 LAB — CBC WITH DIFFERENTIAL/PLATELET
Abs Immature Granulocytes: 0.05 10*3/uL (ref 0.00–0.07)
Basophils Absolute: 0 10*3/uL (ref 0.0–0.1)
Basophils Relative: 0 %
Eosinophils Absolute: 0 10*3/uL (ref 0.0–0.5)
Eosinophils Relative: 0 %
HCT: 30.5 % — ABNORMAL LOW (ref 36.0–46.0)
Hemoglobin: 10 g/dL — ABNORMAL LOW (ref 12.0–15.0)
Immature Granulocytes: 1 %
Lymphocytes Relative: 21 %
Lymphs Abs: 1.6 10*3/uL (ref 0.7–4.0)
MCH: 31.9 pg (ref 26.0–34.0)
MCHC: 32.8 g/dL (ref 30.0–36.0)
MCV: 97.4 fL (ref 80.0–100.0)
Monocytes Absolute: 0.8 10*3/uL (ref 0.1–1.0)
Monocytes Relative: 12 %
Neutro Abs: 4.8 10*3/uL (ref 1.7–7.7)
Neutrophils Relative %: 66 %
Platelets: 464 10*3/uL — ABNORMAL HIGH (ref 150–400)
RBC: 3.13 MIL/uL — ABNORMAL LOW (ref 3.87–5.11)
RDW: 17.8 % — ABNORMAL HIGH (ref 11.5–15.5)
WBC: 7.3 10*3/uL (ref 4.0–10.5)
nRBC: 0.4 % — ABNORMAL HIGH (ref 0.0–0.2)

## 2019-04-09 LAB — COMPREHENSIVE METABOLIC PANEL
ALT: 30 U/L (ref 0–44)
AST: 26 U/L (ref 15–41)
Albumin: 3.8 g/dL (ref 3.5–5.0)
Alkaline Phosphatase: 185 U/L — ABNORMAL HIGH (ref 38–126)
Anion gap: 11 (ref 5–15)
BUN: 21 mg/dL (ref 8–23)
CO2: 25 mmol/L (ref 22–32)
Calcium: 8.7 mg/dL — ABNORMAL LOW (ref 8.9–10.3)
Chloride: 101 mmol/L (ref 98–111)
Creatinine, Ser: 0.51 mg/dL (ref 0.44–1.00)
GFR calc Af Amer: >60 mL/min (ref >60)
GFR calc non Af Amer: >60 mL/min (ref >60)
Glucose, Bld: 127 mg/dL — ABNORMAL HIGH (ref 70–99)
Potassium: 3.7 mmol/L (ref 3.5–5.1)
Sodium: 137 mmol/L (ref 135–145)
Total Bilirubin: 0.7 mg/dL (ref 0.3–1.2)
Total Protein: 7.4 g/dL (ref 6.5–8.1)

## 2019-04-09 LAB — URINALYSIS, COMPLETE (UACMP) WITH MICROSCOPIC
Bacteria, UA: NONE SEEN
Bilirubin Urine: NEGATIVE
Glucose, UA: NEGATIVE mg/dL
Hgb urine dipstick: NEGATIVE
Ketones, ur: NEGATIVE mg/dL
Leukocytes,Ua: NEGATIVE
Nitrite: NEGATIVE
Protein, ur: NEGATIVE mg/dL
Specific Gravity, Urine: 1.015 (ref 1.005–1.030)
pH: 6 (ref 5.0–8.0)

## 2019-04-09 MED ORDER — SODIUM CHLORIDE 0.9 % IV SOLN: INTRAVENOUS | Status: DC

## 2019-04-09 MED ORDER — MORPHINE SULFATE (PF) 2 MG/ML IV SOLN
2.0000 mg | INTRAVENOUS | Status: DC | PRN
  Administered 2019-04-09 – 2019-04-13 (×4): 2 mg via INTRAVENOUS
  Filled 2019-04-09 (×4): qty 1

## 2019-04-09 MED ORDER — SENNOSIDES-DOCUSATE SODIUM 8.6-50 MG PO TABS: 1.0000 | ORAL_TABLET | Freq: Every evening | ORAL | Status: DC | PRN

## 2019-04-09 MED ORDER — ACETAMINOPHEN 325 MG PO TABS
650.0000 mg | ORAL_TABLET | Freq: Four times a day (QID) | ORAL | Status: DC | PRN
  Administered 2019-04-10: 650 mg via ORAL
  Filled 2019-04-09: qty 2

## 2019-04-09 MED ORDER — ONDANSETRON HCL 4 MG/2ML IJ SOLN: 4.0000 mg | Freq: Four times a day (QID) | INTRAMUSCULAR | Status: DC | PRN

## 2019-04-09 MED ORDER — OXYCODONE HCL 5 MG PO TABS
5.0000 mg | ORAL_TABLET | ORAL | Status: DC | PRN
  Administered 2019-04-09 – 2019-04-13 (×10): 5 mg via ORAL
  Filled 2019-04-09 (×12): qty 1

## 2019-04-09 MED ORDER — KETOROLAC TROMETHAMINE 15 MG/ML IJ SOLN
15.0000 mg | Freq: Four times a day (QID) | INTRAMUSCULAR | Status: DC | PRN
  Administered 2019-04-10 – 2019-04-13 (×3): 15 mg via INTRAVENOUS
  Filled 2019-04-09 (×6): qty 1

## 2019-04-09 MED ORDER — ACETAMINOPHEN 650 MG RE SUPP: 650.0000 mg | Freq: Four times a day (QID) | RECTAL | Status: DC | PRN

## 2019-04-09 MED ORDER — ONDANSETRON HCL 4 MG PO TABS: 4.0000 mg | ORAL_TABLET | Freq: Four times a day (QID) | ORAL | Status: DC | PRN

## 2019-04-09 MED ORDER — OXYCODONE-ACETAMINOPHEN 5-325 MG PO TABS
1.0000 | ORAL_TABLET | Freq: Once | ORAL | Status: AC
  Administered 2019-04-09: 1 via ORAL
  Filled 2019-04-09: qty 1

## 2019-04-09 MED ORDER — SODIUM CHLORIDE 0.9 % IV BOLUS
1000.0000 mL | Freq: Once | INTRAVENOUS | Status: AC
  Administered 2019-04-09: 1000 mL via INTRAVENOUS

## 2019-04-09 MED ORDER — ENOXAPARIN SODIUM 40 MG/0.4ML ~~LOC~~ SOLN: 40.0000 mg | SUBCUTANEOUS | Status: DC

## 2019-04-09 MED ORDER — BISACODYL 5 MG PO TBEC: 5.0000 mg | DELAYED_RELEASE_TABLET | Freq: Every day | ORAL | Status: DC | PRN

## 2019-04-09 MED ORDER — MORPHINE SULFATE (PF) 4 MG/ML IV SOLN
4.0000 mg | Freq: Once | INTRAVENOUS | Status: AC
  Administered 2019-04-09: 21:00:00 4 mg via INTRAVENOUS
  Filled 2019-04-09: qty 1

## 2019-04-09 MED ORDER — ONDANSETRON HCL 4 MG/2ML IJ SOLN
4.0000 mg | Freq: Once | INTRAMUSCULAR | Status: AC
  Administered 2019-04-09: 4 mg via INTRAVENOUS
  Filled 2019-04-09: qty 2

## 2019-04-09 NOTE — ED Notes (Signed)
See triage note  Presents with lower back pain  States she is having pain to lower back which radiates into left leg  States she is currently on prednisone taper  But states pain is worse

## 2019-04-09 NOTE — H&P (Signed)
History and Physical    [name] [id number] DOB: February 17, 1929 DOA: 04/09/2019  PCP: [name], MD   Patient coming from: home  I have personally briefly reviewed patient's old medical records in [place]  Chief Complaint: back pain  HPI: Melissa [name] is a 86 y.o. female with medical history significant for GERD, who presents to the emergency room with low back pain radiating to the right leg, not improving with outpatient prednisone treatment possible sciatica.  Patient states that she has had back pain for several years but over the past 1 month the pain has been more debilitating, preventing her from tending to her usual chores.  At baseline she is independent in her ADLs but of late has needed a Rollator to walk and increasing assistance from her husband.  Pain is described as severe and sharp in nature and worse with changes of position.  Radiates down the left leg.  She denies saddle anesthesia, bowel or bladder retention.  She denies fever or chills.  Had no recent falls or injury ED Course: On arrival in the emergency room, vitals were mostly within normal limits.  She had a CT of her lumbar spine which was followed up with a noncontrasted MRI that showed severe L1 compression fracture with significant retropulsion and new moderate spinal stenosis with moderate to severe bilateral foraminal stenosis and mild mass-effect on the conus but no associated lower cord signal abnormality.  It also showed superimposed acute to subacute sacral fractures which may be insufficiency fractures in the absence of recent fall or trauma.  Urgency room provider contacted neurosurgeon, Dr. [name] who recommended lumbar spinal orthotic for pain, admission for pain control and recommended keeping n.p.o. overnight for possible sacral plasty by Dr. [name] in the a.m.  Blood work as well as urinalysis pending at the time of decision to admit.  Review of Systems: As per HPI otherwise 10 point  review of systems negative.    Past Medical History:  Diagnosis Date  . Arthritis   . Chest pain    a. 05/2015 - post-prandial.  . GERD (gastroesophageal reflux disease)    a. takes Omeprazole daily, prev seen by D. Brodie.  Marland Kitchen Heart murmur    a. 05/2013 Echo: EF 55-60%, gr2 DD, mild AI/MR.  Marland Kitchen History of blood transfusion    no abnormal reaction noted   . History of migraine headaches    a. last 10/2013  . Iron deficiency anemia   . Joint pain   . PONV (postoperative nausea and vomiting)   . Rectocele 2011    Past Surgical History:  Procedure Laterality Date  . cataract surgery  Bilateral    R 03/2001; L 05/2001.  Marland Kitchen COLONOSCOPY    . HYSTEROSCOPY  11/1999   w/ resection of endometrial polyp by uterine curetting.  Marland Kitchen JOINT REPLACEMENT Bilateral R-9/04, L- 8/05   Total Knee Arthroplasties.  Marland Kitchen REVERSE SHOULDER ARTHROPLASTY Right 11/26/2013   Procedure: RIGHT REVERSE SHOULDER ARTHROPLASTY;  Surgeon: Marin Shutter, MD;  Location: [facility];  Service: Orthopedics;  Laterality: Right;  . SHOULDER ARTHROSCOPY Right   . THYROIDECTOMY  at age 29  . TONSILLECTOMY       reports that she has never smoked. She has never used smokeless tobacco. She reports that she does not drink alcohol or use drugs.  Allergies  Allergen Reactions  . Sulfonamide Derivatives Hives    Family History  Problem Relation Age of Onset  . Heart attack Mother  died @ 6  . Heart attack Brother        died @ 44  . Heart attack Brother        died @ 72  . Other Father        died suddenly @ 76.     Prior to Admission medications   Medication Sig Start Date End Date Taking? Authorizing Provider  cholecalciferol (VITAMIN D) 1000 units tablet Take 1,000 Units by mouth daily.   Yes [provider]  Multiple Vitamin (MULTIVITAMIN WITH MINERALS) TABS tablet Take 1 tablet by mouth daily.   Yes [provider]  mupirocin ointment (BACTROBAN) 2 % Apply to wound after soaking BID 02/26/18  Yes  Hyatt, Max T, DPM  omeprazole (PRILOSEC OTC) 20 MG tablet Take 40 mg by mouth daily.    Yes [provider]  predniSONE (DELTASONE) 20 MG tablet Take 20-40 mg by mouth daily at 6 (six) AM. 04/06/19  Yes [provider]  Probiotic Product (PROBIOTIC DAILY PO) Take 1 tablet by mouth daily at 6 (six) AM.    Yes [provider]  trolamine salicylate (ASPERCREME) 10 % cream Apply 1 application topically as needed for muscle pain.    [provider]    Physical Exam: Vitals:   04/09/19 1439 04/09/19 1440  BP: (!) 151/74   Pulse: 80   Resp: 16   Temp: 98.1 F (36.7 C)   TempSrc: Oral   SpO2: 94%   Weight:  51.3 kg  Height:  5\' 3"  (1.6 m)     Vitals:   04/09/19 1439 04/09/19 1440  BP: (!) 151/74   Pulse: 80   Resp: 16   Temp: 98.1 F (36.7 C)   TempSrc: Oral   SpO2: 94%   Weight:  51.3 kg  Height:  5\' 3"  (1.6 m)    Constitutional: NAD, alert and oriented x 3 Eyes: PERRL, lids and conjunctivae normal ENMT: Mucous membranes are moist.  Neck: normal, supple, no masses, no thyromegaly Respiratory: clear to auscultation bilaterally, no wheezing, no crackles. Normal respiratory effort. No accessory muscle use.  Cardiovascular: Regular rate and rhythm, no murmurs / rubs / gallops. No extremity edema. 2+ pedal pulses. No carotid bruits.  Abdomen: no tenderness, no masses palpated. No hepatosplenomegaly. Bowel sounds positive.  Musculoskeletal: no clubbing / cyanosis. No joint deformity upper and lower extremities.  Skin: no rashes, lesions, ulcers.  Neurologic: No gross focal neurologic deficit. Psychiatric: Normal mood and affect.   Labs on Admission: I have personally reviewed following labs and imaging studies  CBC: No results for input(s): WBC, NEUTROABS, HGB, HCT, MCV, PLT in the last 168 hours. Basic Metabolic Panel: No results for input(s): NA, K, CL, CO2, GLUCOSE, BUN, CREATININE, CALCIUM, MG, PHOS in the last 168 hours. GFR: CrCl  cannot be calculated (Patient's most recent lab result is older than the maximum 21 days allowed.). Liver Function Tests: No results for input(s): AST, ALT, ALKPHOS, BILITOT, PROT, ALBUMIN in the last 168 hours. No results for input(s): LIPASE, AMYLASE in the last 168 hours. No results for input(s): AMMONIA in the last 168 hours. Coagulation Profile: No results for input(s): INR, PROTIME in the last 168 hours. Cardiac Enzymes: No results for input(s): CKTOTAL, CKMB, CKMBINDEX, TROPONINI in the last 168 hours. BNP (last 3 results) No results for input(s): PROBNP in the last 8760 hours. HbA1C: No results for input(s): HGBA1C in the last 72 hours. CBG: No results for input(s): GLUCAP in the last 168 hours.  Lipid Profile: No results for input(s): CHOL, HDL, LDLCALC, TRIG, CHOLHDL, LDLDIRECT in the last 72 hours. Thyroid Function Tests: No results for input(s): TSH, T4TOTAL, FREET4, T3FREE, THYROIDAB in the last 72 hours. Anemia Panel: No results for input(s): VITAMINB12, FOLATE, FERRITIN, TIBC, IRON, RETICCTPCT in the last 72 hours. Urine analysis:    Component Value Date/Time   COLORURINE YELLOW (A) 04/09/2019 1536   APPEARANCEUR CLEAR (A) 04/09/2019 1536   LABSPEC 1.015 04/09/2019 1536   PHURINE 6.0 04/09/2019 1536   GLUCOSEU NEGATIVE 04/09/2019 1536   HGBUR NEGATIVE 04/09/2019 1536   BILIRUBINUR NEGATIVE 04/09/2019 1536   KETONESUR NEGATIVE 04/09/2019 1536   PROTEINUR NEGATIVE 04/09/2019 1536   NITRITE NEGATIVE 04/09/2019 1536   LEUKOCYTESUR NEGATIVE 04/09/2019 1536    Radiological Exams on Admission: CT Lumbar Spine Wo Contrast  Result Date: 04/09/2019 CLINICAL DATA:  Lumbar radiculopathy EXAM: CT LUMBAR SPINE WITHOUT CONTRAST TECHNIQUE: Multidetector CT imaging of the lumbar spine was performed without intravenous contrast administration. Multiplanar CT image reconstructions were also generated. COMPARISON:  Plain films 05/22/2018. FINDINGS: Segmentation: 5 lumbar type  vertebrae. Alignment: Minimal retrolisthesis of T11 over T12. Grade I anterolisthesis of L4 over L5 related to degenerative facet disease. Vertebrae: Compression fracture noted at L1 with flattening of the anterior two thirds of the vertebral body and retropulsion of the posterior wall into the spinal canal resulting in moderate spinal canal stenosis. This is new from prior plain films. Compression fractures of the T11 and L4 vertebral bodies are again seen and appear unchanged. Diffuse decrease of the bone density. Paraspinal and other soft tissues: Calcified plaques noted in the abdominal aorta. Disc levels: T10-T11: Mild retropulsion of the superior endplate resulting in mild spinal canal stenosis. T11-T12: Posterior disc protrusion with endplate spurring and facet degenerative changes causing mild spinal canal narrowing and mild bilateral neural foraminal stenosis. T12-L1: Calcified posterior disc protrusion and facet degenerative changes causing mild spinal canal stenosis. No significant neural foraminal narrowing. L1-L2: Compression fracture as described above with retropulsed fragment causing moderate spinal canal stenosis and extending into the bilateral neural foramen resulting in moderate bilateral stenosis. L2-3: Posterior disc protrusion and facet degenerative changes resulting in mild spinal canal stenosis and mild bilateral neural foraminal narrowing. L3-4: Disc bulge, facet degenerative changes and ligamentum flavum redundancy resulting mild spinal canal stenosis and moderate bilateral neural foraminal narrowing. L4-5: Disc bulge, prominent facet degenerative changes and ligamentum flavum redundancy resulting in moderate spinal canal stenosis and moderate bilateral neural foraminal narrowing. L5-S1: Disc bulge and moderate facet degenerative changes without significant spinal canal or neural foraminal stenosis. IMPRESSION: 1. New compression fracture at L1 with retropulsed fragment causing moderate  spinal canal stenosis. 2. Multilevel degenerative changes, more pronounced at L4-5 where there is grade 1 anterolisthesis related to prominent facet degeneration, moderate spinal canal stenosis and moderate bilateral neural foraminal narrowing. 3. Unchanged compression fractures at T11 and L4. Aortic Atherosclerosis (ICD10-I70.0). Electronically Signed   By: Pedro Earls M.D.   On: 04/09/2019 16:15   MR LUMBAR SPINE WO CONTRAST  Result Date: 04/09/2019 CLINICAL DATA:  86 year old female with subacute L4 compression fracture diagnosed by MRI last month. New severe L1 compression fracture on CT earlier today. EXAM: MRI LUMBAR SPINE WITHOUT CONTRAST TECHNIQUE: Multiplanar, multisequence MR imaging of the lumbar spine was performed. No intravenous contrast was administered. COMPARISON:  Lumbar spine CT earlier today. Lumbar MRI 03/18/2018. FINDINGS: Segmentation:  Normal, same numbering system as on the comparisons. Alignment: Stable from the CT earlier today. Retropulsion at  L1 is new since January, described below. Stable chronic grade 1 anterolisthesis at L4-L5 and L5-S1. Vertebrae: Marrow edema throughout the severely compressed L1 vertebral body, new since 03/18/2018. Retropulsion of the posteroinferior endplate as seen by CT today resulting in moderate spinal stenosis with up to mild mass effect on the conus (series 9, image 9). No marrow edema in the L1 posterior elements. Chronic T12 compression fracture is stable. The mild L4 compression fracture has not progressed since January marrow edema there has resolved. Also new since January confluent bilateral sacral ala marrow edema (series 11, image 14 on the left and image 2 on the right. There is an associated nondisplaced appearing fracture through the central S2-S3 level. Associated presacral fluid or edema. Faint posterior endplate marrow edema at T12 inferiorly and L2-L3 appears to be degenerative in nature. Conus medullaris and cauda  equina: Conus extends to the L1 level, as stated above. No signal abnormality in the visible lower thoracic spinal cord or conus. Paraspinal and other soft tissues: Mild presacral fluid or edema as stated above. Negative visible abdominal viscera. Disc levels: Widespread lower thoracic and lumbar spine degeneration is stable since last month with the following exceptions: L1-L2: Bulky posterior disc osteophyte complex in large part due to the L1 posteroinferior endplate retropulsion results in new spinal stenosis with up to mild mass effect on the conus. There is associated new moderate to severe bilateral L1 foraminal stenosis. IMPRESSION: 1. Severe L1 compression fracture as seen by CT today is new since 03/18/2018. Significant retropulsion results in new moderate spinal stenosis and moderate to severe bilateral foraminal stenosis. Up to mild mass effect on the conus, but no associated lower cord signal abnormality. 2. Superimposed acute to subacute sacral fractures: Bilateral sacral ala and central S2-S3 segment. These may be insufficiency fractures in the absence of recent fall/trauma. 3. Mild L4 compression fracture remain stable since January. Chronic T12 compression fractures. Electronically Signed   By: Genevie Ann M.D.   On: 04/09/2019 18:28    Assessment/Plan Principal Problem:    Intractable low back pain Acute compression fracture of lumbosacral vertebra, nontraumatic,with retropulsion  Sacral insufficiency fracture -Patient admitted for pain control -Being kept n.p.o. at recommendation of surgery for possible sacroplasty in the a.m. -Oxycodone for pain with IV Toradol as well as IV morphine for breakthrough -Order placed to arrange placement of LSO -Neurosurgical consult requested --  Anemia --Baseline since 2015 --continue to monitor   GERD -Continue home omeprazole   DVT prophylaxis: SCDs for now given possible sacroplasty in am  Code Status: full code  Family Communication: none   Disposition Plan: Back to previous home environment Consults called: Dr Izora Ribas, neurosurgery     [name] MD [facility]     04/09/2019, 8:56 PM

## 2019-04-09 NOTE — Consult Note (Addendum)
Full note to follow.  86 yo female with no history of significant trauma presents with worsening back pain.  MRI shows L1 and L4 compression fractures and sacral insufficiency fractures.  No neuro deficits.  Plan:  - LSO for pain - pain control - consider sacroplasty - I will contact Dr. [name] to review - plan to keep NPO after MN tonight for possible intervention by Dr. [name] - Admit to medicine for pain control  Meade Maw MD

## 2019-04-09 NOTE — ED Triage Notes (Signed)
Patient from home via [facility]. Patient reports she is currently on prednisone for sciatica, however she is still in a great amount of pain.

## 2019-04-09 NOTE — ED Provider Notes (Signed)
[facility] Emergency Department Provider Note  ____________________________________________  Time seen: Approximately 3:19 PM  I have reviewed the triage vital signs and the nursing notes.   HISTORY  Chief Complaint Back Pain    HPI Melissa [name] is a 86 y.o. female who presents to the emergency department complaining of worsening lower back pain radiating down her left leg.  Patient states that she has had sciatica in the past, reportedly patient is on prednisone for same.  Patient states that she is having increasing pain with pain and tingling running down to her foot.  No recent trauma.  Patient denies any fevers or chills, abdominal pain, constipation, diarrhea.  Dysuria, polyuria, hematuria.  No history of nephrolithiasis.  Patient states that she had called her primary care as scheduled to see him tomorrow, however the pain had been worsening and she needed "something to help with the pain."  Patient states that the pain is keeping her awake at night.  No loss of sensation to either lower extremity.  No bowel or bladder dysfunction or incontinence, no saddle anesthesia.         Past Medical History:  Diagnosis Date  . Arthritis   . Chest pain    a. 05/2015 - post-prandial.  . GERD (gastroesophageal reflux disease)    a. takes Omeprazole daily, prev seen by D. Brodie.  Marland Kitchen Heart murmur    a. 05/2013 Echo: EF 55-60%, gr2 DD, mild AI/MR.  Marland Kitchen History of blood transfusion    no abnormal reaction noted   . History of migraine headaches    a. last 10/2013  . Iron deficiency anemia   . Joint pain   . PONV (postoperative nausea and vomiting)   . Rectocele 2011    Patient Active Problem List   Diagnosis Date Noted  . AI (aortic incompetence) 12/30/2017  . OP (osteoporosis) 12/30/2017  . S/P shoulder replacement 11/26/2013  . ANEMIA, IRON DEFICIENCY 04/05/2009  . HEMORRHOIDS, INTERNAL 04/05/2009  . GERD 04/05/2009  . CONSTIPATION 04/05/2009  .  ARTHRITIS 04/05/2009    Past Surgical History:  Procedure Laterality Date  . cataract surgery  Bilateral    R 03/2001; L 05/2001.  Marland Kitchen COLONOSCOPY    . HYSTEROSCOPY  11/1999   w/ resection of endometrial polyp by uterine curetting.  Marland Kitchen JOINT REPLACEMENT Bilateral R-9/04, L- 8/05   Total Knee Arthroplasties.  Marland Kitchen REVERSE SHOULDER ARTHROPLASTY Right 11/26/2013   Procedure: RIGHT REVERSE SHOULDER ARTHROPLASTY;  Surgeon: Marin Shutter, MD;  Location: [place];  Service: Orthopedics;  Laterality: Right;  . SHOULDER ARTHROSCOPY Right   . THYROIDECTOMY  at age 85  . TONSILLECTOMY      Prior to Admission medications   Medication Sig Start Date End Date Taking? Authorizing Provider  cholecalciferol (VITAMIN D) 1000 units tablet Take 1,000 Units by mouth daily.   Yes [provider]  Multiple Vitamin (MULTIVITAMIN WITH MINERALS) TABS tablet Take 1 tablet by mouth daily.   Yes [provider]  mupirocin ointment (BACTROBAN) 2 % Apply to wound after soaking BID 02/26/18  Yes Hyatt, Max T, DPM  omeprazole (PRILOSEC OTC) 20 MG tablet Take 40 mg by mouth daily.    Yes [provider]  predniSONE (DELTASONE) 20 MG tablet Take 20-40 mg by mouth daily at 6 (six) AM. 04/06/19  Yes [provider]  Probiotic Product (PROBIOTIC DAILY PO) Take 1 tablet by mouth daily at 6 (six) AM.    Yes [provider]  trolamine  salicylate (ASPERCREME) 10 % cream Apply 1 application topically as needed for muscle pain.    [provider]    Allergies Sulfonamide derivatives  Family History  Problem Relation Age of Onset  . Heart attack Mother        died @ 34  . Heart attack Brother        died @ 66  . Heart attack Brother        died @ 55  . Other Father        died suddenly @ 78.    Social History Social History   Tobacco Use  . Smoking status: Never Smoker  . Smokeless tobacco: Never Used  Substance Use Topics  . Alcohol use: No  . Drug use: No      Review of Systems  Constitutional: No fever/chills Eyes: No visual changes. No discharge ENT: No upper respiratory complaints. Cardiovascular: no chest pain. Respiratory: no cough. No SOB. Gastrointestinal: No abdominal pain.  No nausea, no vomiting.  No diarrhea.  No constipation. Genitourinary: Negative for dysuria. No hematuria Musculoskeletal: Increasing lower back pain radiating down the left leg. Skin: Negative for rash, abrasions, lacerations, ecchymosis. Neurological: Negative for headaches, focal weakness or numbness. 10-point ROS otherwise negative.  ____________________________________________   PHYSICAL EXAM:  VITAL SIGNS: ED Triage Vitals  Enc Vitals Group     BP 04/09/19 1439 (!) 151/74     Pulse Rate 04/09/19 1439 80     Resp 04/09/19 1439 16     Temp 04/09/19 1439 98.1 F (36.7 C)     Temp Source 04/09/19 1439 Oral     SpO2 04/09/19 1439 94 %     Weight 04/09/19 1440 113 lb (51.3 kg)     Height 04/09/19 1440 5\' 3"  (1.6 m)     Head Circumference --      Peak Flow --      Pain Score 04/09/19 1440 10     Pain Loc --      Pain Edu? --      Excl. in [place]? --      Constitutional: Alert and oriented. Well appearing and in no acute distress. Eyes: Conjunctivae are normal. PERRL. EOMI. Head: Atraumatic. ENT:      Ears:       Nose: No congestion/rhinnorhea.      Mouth/Throat: Mucous membranes are moist.  Neck: No stridor.   Hematological/Lymphatic/Immunilogical: No cervical lymphadenopathy. Cardiovascular: Normal rate, regular rhythm. Normal S1 and S2.  Good peripheral circulation. Respiratory: Normal respiratory effort without tachypnea or retractions. Lungs CTAB. Good air entry to the bases with no decreased or absent breath sounds. Gastrointestinal: Bowel sounds 4 quadrants. Soft and nontender to palpation. No guarding or rigidity. No palpable masses. No distention. No CVA tenderness. Musculoskeletal: Full range of motion to all extremities. No gross  deformities appreciated.  Visualization of the lumbar spine reveals no visible abnormality.  Limited range of motion due to pain.  Patient is tender throughout the lumbar region both midline and bilateral paraspinal muscles.  While diffuse tenderness exists, no point specific tenderness.  Patient is tender to palpation over the right-sided sciatic notch.  Dorsalis pedis pulse intact bilateral lower extremities.  Sensation intact and equal bilateral lower extremities. Neurologic:  Normal speech and language. No gross focal neurologic deficits are appreciated.  Skin:  Skin is warm, dry and intact. No rash noted. Psychiatric: Mood and affect are normal. Speech and behavior are normal. Patient exhibits appropriate insight and judgement.   ____________________________________________  LABS (all labs ordered are listed, but only abnormal results are displayed)  Labs Reviewed  URINALYSIS, COMPLETE (UACMP) WITH MICROSCOPIC - Abnormal; Notable for the following components:      Result Value   Color, Urine YELLOW (*)    APPearance CLEAR (*)    All other components within normal limits  SARS CORONAVIRUS 2 (TAT 6-24 HRS)  COMPREHENSIVE METABOLIC PANEL  CBC WITH DIFFERENTIAL/PLATELET   ____________________________________________  EKG   ____________________________________________  RADIOLOGY I personally viewed and evaluated these images as part of my medical decision making, as well as reviewing the written report by the radiologist.  CT Lumbar Spine Wo Contrast  Result Date: 04/09/2019 CLINICAL DATA:  Lumbar radiculopathy EXAM: CT LUMBAR SPINE WITHOUT CONTRAST TECHNIQUE: Multidetector CT imaging of the lumbar spine was performed without intravenous contrast administration. Multiplanar CT image reconstructions were also generated. COMPARISON:  Plain films 05/22/2018. FINDINGS: Segmentation: 5 lumbar type vertebrae. Alignment: Minimal retrolisthesis of T11 over T12. Grade I anterolisthesis of L4  over L5 related to degenerative facet disease. Vertebrae: Compression fracture noted at L1 with flattening of the anterior two thirds of the vertebral body and retropulsion of the posterior wall into the spinal canal resulting in moderate spinal canal stenosis. This is new from prior plain films. Compression fractures of the T11 and L4 vertebral bodies are again seen and appear unchanged. Diffuse decrease of the bone density. Paraspinal and other soft tissues: Calcified plaques noted in the abdominal aorta. Disc levels: T10-T11: Mild retropulsion of the superior endplate resulting in mild spinal canal stenosis. T11-T12: Posterior disc protrusion with endplate spurring and facet degenerative changes causing mild spinal canal narrowing and mild bilateral neural foraminal stenosis. T12-L1: Calcified posterior disc protrusion and facet degenerative changes causing mild spinal canal stenosis. No significant neural foraminal narrowing. L1-L2: Compression fracture as described above with retropulsed fragment causing moderate spinal canal stenosis and extending into the bilateral neural foramen resulting in moderate bilateral stenosis. L2-3: Posterior disc protrusion and facet degenerative changes resulting in mild spinal canal stenosis and mild bilateral neural foraminal narrowing. L3-4: Disc bulge, facet degenerative changes and ligamentum flavum redundancy resulting mild spinal canal stenosis and moderate bilateral neural foraminal narrowing. L4-5: Disc bulge, prominent facet degenerative changes and ligamentum flavum redundancy resulting in moderate spinal canal stenosis and moderate bilateral neural foraminal narrowing. L5-S1: Disc bulge and moderate facet degenerative changes without significant spinal canal or neural foraminal stenosis. IMPRESSION: 1. New compression fracture at L1 with retropulsed fragment causing moderate spinal canal stenosis. 2. Multilevel degenerative changes, more pronounced at L4-5 where there  is grade 1 anterolisthesis related to prominent facet degeneration, moderate spinal canal stenosis and moderate bilateral neural foraminal narrowing. 3. Unchanged compression fractures at T11 and L4. Aortic Atherosclerosis (ICD10-I70.0). Electronically Signed   By: Pedro Earls M.D.   On: 04/09/2019 16:15   MR LUMBAR SPINE WO CONTRAST  Result Date: 04/09/2019 CLINICAL DATA:  86 year old female with subacute L4 compression fracture diagnosed by MRI last month. New severe L1 compression fracture on CT earlier today. EXAM: MRI LUMBAR SPINE WITHOUT CONTRAST TECHNIQUE: Multiplanar, multisequence MR imaging of the lumbar spine was performed. No intravenous contrast was administered. COMPARISON:  Lumbar spine CT earlier today. Lumbar MRI 03/18/2018. FINDINGS: Segmentation:  Normal, same numbering system as on the comparisons. Alignment: Stable from the CT earlier today. Retropulsion at L1 is new since January, described below. Stable chronic grade 1 anterolisthesis at L4-L5 and L5-S1. Vertebrae: Marrow edema throughout the severely compressed L1 vertebral body, new since  03/18/2018. Retropulsion of the posteroinferior endplate as seen by CT today resulting in moderate spinal stenosis with up to mild mass effect on the conus (series 9, image 9). No marrow edema in the L1 posterior elements. Chronic T12 compression fracture is stable. The mild L4 compression fracture has not progressed since January marrow edema there has resolved. Also new since January confluent bilateral sacral ala marrow edema (series 11, image 14 on the left and image 2 on the right. There is an associated nondisplaced appearing fracture through the central S2-S3 level. Associated presacral fluid or edema. Faint posterior endplate marrow edema at T12 inferiorly and L2-L3 appears to be degenerative in nature. Conus medullaris and cauda equina: Conus extends to the L1 level, as stated above. No signal abnormality in the visible lower  thoracic spinal cord or conus. Paraspinal and other soft tissues: Mild presacral fluid or edema as stated above. Negative visible abdominal viscera. Disc levels: Widespread lower thoracic and lumbar spine degeneration is stable since last month with the following exceptions: L1-L2: Bulky posterior disc osteophyte complex in large part due to the L1 posteroinferior endplate retropulsion results in new spinal stenosis with up to mild mass effect on the conus. There is associated new moderate to severe bilateral L1 foraminal stenosis. IMPRESSION: 1. Severe L1 compression fracture as seen by CT today is new since 03/18/2018. Significant retropulsion results in new moderate spinal stenosis and moderate to severe bilateral foraminal stenosis. Up to mild mass effect on the conus, but no associated lower cord signal abnormality. 2. Superimposed acute to subacute sacral fractures: Bilateral sacral ala and central S2-S3 segment. These may be insufficiency fractures in the absence of recent fall/trauma. 3. Mild L4 compression fracture remain stable since January. Chronic T12 compression fractures. Electronically Signed   By: Genevie Ann M.D.   On: 04/09/2019 18:28    ____________________________________________    PROCEDURES  Procedure(s) performed:    Procedures    Medications  sodium chloride 0.9 % bolus 1,000 mL (has no administration in time range)  morphine 4 MG/ML injection 4 mg (has no administration in time range)  ondansetron (ZOFRAN) injection 4 mg (has no administration in time range)  oxyCODONE-acetaminophen (PERCOCET/ROXICET) 5-325 MG per tablet 1 tablet (1 tablet Oral Given 04/09/19 1610)     ____________________________________________   INITIAL IMPRESSION / ASSESSMENT AND PLAN / ED COURSE  Pertinent labs & imaging results that were available during my care of the patient were reviewed by me and considered in my medical decision making (see chart for details).  Review of the [place] CSRS was  performed in accordance of the [place] prior to dispensing any controlled drugs.  Clinical Course as of Apr 09 2035  Thu Apr 09, 2019  [id number] Patient presented to emergency department with worsening lower back pain with radicular symptoms down the left leg with increased pain, numbness and tingling.  No bowel or bladder dysfunction, saddle anesthesia.  Patient had been sensation of bilateral lower extremities.  Initial imaging revealed new compression fracture of L1 with retropulsion of fragments pushing on spinal cord.  This was followed up with MRI which again confirms new compression fracture with moderate stenosis.  There also appears to be sacral alla insufficiency fractures identified on MRI.  Given the findings on CT, MRI I discussed the case with on-call neurosurgeon, Dr. [name].  After reviewing the images, it is felt that patient would be best suited with admission to the hospital service for pain control, follow-up with neurosurgery with specialized brace placement  tomorrow.   [JC]    Clinical Course User Index [JC] Korrey Schleicher, Charline Bills, PA-C          Patient's diagnosis is consistent with L1 compression fracture with spinal stenosis, sacral insufficiency fracture.  Patient presented to the emergency department with worsening back pain.  Patient was already on steroids for sciatica symptoms.  Patient reports that she has had worsening pain, inability to sleep over the last 3 days given her amount of pain.  No recent injuries.  Patient denied any urinary or GI complaints.  Urinalysis reveals no evidence of urinary tract infection.  Initial imaging with CT scan revealed new, severe L1 compression fracture with retropulsion fragments creating moderate spinal stenosis.  Given increased symptoms, MRI was also obtained.  This confirms moderate spinal stenosis.  Patient also has identified bilateral sacral alla insufficiency fractures.  I discussed the case with on-call neurosurgeon, Dr.  [name].  Patient is to be admitted to medical service, pain management overnight with neurosurgery evaluation in the morning.  Surgeon does not feel this is surgical at this time.  Patient will require specialized lumbar brace to be placed in this is not available this evening.  Patient is admitted to the hospitalist service.    ____________________________________________  FINAL CLINICAL IMPRESSION(S) / ED DIAGNOSES  Final diagnoses:  Closed compression fracture of body of L1 vertebra ([place])  Spinal stenosis of lumbar region without neurogenic claudication  Sacral insufficiency fracture, initial encounter      NEW MEDICATIONS STARTED DURING THIS VISIT:  ED Discharge Orders    None          This chart was dictated using voice recognition software/Dragon. Despite best efforts to proofread, errors can occur which can change the meaning. Any change was purely unintentional.    Darletta Moll, PA-C 04/09/19 2037    Harvest Dark, MD 04/09/19 2238

## 2019-04-10 ENCOUNTER — Encounter
Admission: EM | Disposition: A | Discharge status: Skilled Nursing Facility | Payer: Self-pay | Source: Home / Self Care | Attending: Internal Medicine

## 2019-04-10 ENCOUNTER — Encounter: Payer: Self-pay | Admitting: Internal Medicine

## 2019-04-10 ENCOUNTER — Observation Stay: Payer: Medicare Other | Admitting: Anesthesiology

## 2019-04-10 ENCOUNTER — Observation Stay: Payer: Medicare Other

## 2019-04-10 DIAGNOSIS — M4848XA Fatigue fracture of vertebra, sacral and sacrococcygeal region, initial encounter for fracture: Secondary | ICD-10-CM | POA: Diagnosis not present

## 2019-04-10 DIAGNOSIS — S32010A Wedge compression fracture of first lumbar vertebra, initial encounter for closed fracture: Secondary | ICD-10-CM | POA: Diagnosis not present

## 2019-04-10 DIAGNOSIS — M4856XA Collapsed vertebra, not elsewhere classified, lumbar region, initial encounter for fracture: Secondary | ICD-10-CM | POA: Diagnosis not present

## 2019-04-10 DIAGNOSIS — M545 Low back pain: Secondary | ICD-10-CM | POA: Diagnosis not present

## 2019-04-10 DIAGNOSIS — K219 Gastro-esophageal reflux disease without esophagitis: Secondary | ICD-10-CM | POA: Diagnosis not present

## 2019-04-10 DIAGNOSIS — S32000A Wedge compression fracture of unspecified lumbar vertebra, initial encounter for closed fracture: Secondary | ICD-10-CM | POA: Diagnosis not present

## 2019-04-10 DIAGNOSIS — M47817 Spondylosis without myelopathy or radiculopathy, lumbosacral region: Secondary | ICD-10-CM | POA: Diagnosis not present

## 2019-04-10 HISTORY — PX: KYPHOPLASTY: SHX5884

## 2019-04-10 LAB — CBC
HCT: 26.9 % — ABNORMAL LOW (ref 36.0–46.0)
Hemoglobin: 8.8 g/dL — ABNORMAL LOW (ref 12.0–15.0)
MCH: 32.5 pg (ref 26.0–34.0)
MCHC: 32.7 g/dL (ref 30.0–36.0)
MCV: 99.3 fL (ref 80.0–100.0)
Platelets: 423 10*3/uL — ABNORMAL HIGH (ref 150–400)
RBC: 2.71 MIL/uL — ABNORMAL LOW (ref 3.87–5.11)
RDW: 18 % — ABNORMAL HIGH (ref 11.5–15.5)
WBC: 7 10*3/uL (ref 4.0–10.5)
nRBC: 0.4 % — ABNORMAL HIGH (ref 0.0–0.2)

## 2019-04-10 LAB — BASIC METABOLIC PANEL
Anion gap: 7 (ref 5–15)
BUN: 20 mg/dL (ref 8–23)
CO2: 28 mmol/L (ref 22–32)
Calcium: 8.1 mg/dL — ABNORMAL LOW (ref 8.9–10.3)
Chloride: 103 mmol/L (ref 98–111)
Creatinine, Ser: 0.59 mg/dL (ref 0.44–1.00)
GFR calc Af Amer: >60 mL/min (ref >60)
GFR calc non Af Amer: >60 mL/min (ref >60)
Glucose, Bld: 91 mg/dL (ref 70–99)
Potassium: 3.7 mmol/L (ref 3.5–5.1)
Sodium: 138 mmol/L (ref 135–145)

## 2019-04-10 LAB — MAGNESIUM: Magnesium: 2.2 mg/dL (ref 1.7–2.4)

## 2019-04-10 LAB — SARS CORONAVIRUS 2 (TAT 6-24 HRS): SARS Coronavirus 2: NEGATIVE

## 2019-04-10 LAB — PHOSPHORUS: Phosphorus: 3.5 mg/dL (ref 2.5–4.6)

## 2019-04-10 SURGERY — KYPHOPLASTY
Anesthesia: General | Site: Back

## 2019-04-10 MED ORDER — PROPOFOL 10 MG/ML IV BOLUS
INTRAVENOUS | Status: DC | PRN
  Administered 2019-04-10: 30 mg via INTRAVENOUS
  Administered 2019-04-10: 15 mg via INTRAVENOUS

## 2019-04-10 MED ORDER — BISACODYL 10 MG RE SUPP: 10.0000 mg | Freq: Every day | RECTAL | Status: DC | PRN

## 2019-04-10 MED ORDER — MAGNESIUM CITRATE PO SOLN
1.0000 | Freq: Once | ORAL | Status: DC | PRN
  Filled 2019-04-10: qty 296

## 2019-04-10 MED ORDER — ONDANSETRON HCL 4 MG/2ML IJ SOLN: 4.0000 mg | Freq: Once | INTRAMUSCULAR | Status: DC | PRN

## 2019-04-10 MED ORDER — BUPIVACAINE-EPINEPHRINE (PF) 0.5% -1:200000 IJ SOLN
INTRAMUSCULAR | Status: AC
  Filled 2019-04-10: qty 30

## 2019-04-10 MED ORDER — PROPOFOL 500 MG/50ML IV EMUL
INTRAVENOUS | Status: DC | PRN
  Administered 2019-04-10: 50 ug/kg/min via INTRAVENOUS

## 2019-04-10 MED ORDER — DEXAMETHASONE SODIUM PHOSPHATE 10 MG/ML IJ SOLN
INTRAMUSCULAR | Status: AC
  Filled 2019-04-10: qty 1

## 2019-04-10 MED ORDER — FENTANYL CITRATE (PF) 100 MCG/2ML IJ SOLN
INTRAMUSCULAR | Status: AC
  Administered 2019-04-10: 25 ug via INTRAVENOUS
  Filled 2019-04-10: qty 2

## 2019-04-10 MED ORDER — ONDANSETRON HCL 4 MG PO TABS: 4.0000 mg | ORAL_TABLET | Freq: Four times a day (QID) | ORAL | Status: DC | PRN

## 2019-04-10 MED ORDER — ONDANSETRON HCL 4 MG/2ML IJ SOLN
INTRAMUSCULAR | Status: AC
  Filled 2019-04-10: qty 2

## 2019-04-10 MED ORDER — LIDOCAINE HCL 1 % IJ SOLN
INTRAMUSCULAR | Status: DC | PRN
  Administered 2019-04-10: 45 mL

## 2019-04-10 MED ORDER — FENTANYL CITRATE (PF) 100 MCG/2ML IJ SOLN
INTRAMUSCULAR | Status: AC
  Filled 2019-04-10: qty 2

## 2019-04-10 MED ORDER — OMEPRAZOLE MAGNESIUM 20 MG PO TBEC: 40.0000 mg | DELAYED_RELEASE_TABLET | Freq: Every day | ORAL | Status: DC

## 2019-04-10 MED ORDER — PANTOPRAZOLE SODIUM 40 MG PO TBEC
40.0000 mg | DELAYED_RELEASE_TABLET | Freq: Every day | ORAL | Status: DC
  Administered 2019-04-11 – 2019-04-13 (×3): 40 mg via ORAL
  Filled 2019-04-10 (×3): qty 1

## 2019-04-10 MED ORDER — MAGNESIUM HYDROXIDE 400 MG/5ML PO SUSP: 30.0000 mL | Freq: Every day | ORAL | Status: DC | PRN

## 2019-04-10 MED ORDER — PREDNISONE 20 MG PO TABS
20.0000 mg | ORAL_TABLET | Freq: Every day | ORAL | Status: DC
  Administered 2019-04-11 – 2019-04-13 (×3): 20 mg via ORAL
  Filled 2019-04-10 (×3): qty 1

## 2019-04-10 MED ORDER — FENTANYL CITRATE (PF) 100 MCG/2ML IJ SOLN
INTRAMUSCULAR | Status: DC | PRN
  Administered 2019-04-10: 25 ug via INTRAVENOUS
  Administered 2019-04-10: 50 ug via INTRAVENOUS
  Administered 2019-04-10: 25 ug via INTRAVENOUS

## 2019-04-10 MED ORDER — DOCUSATE SODIUM 100 MG PO CAPS
100.0000 mg | ORAL_CAPSULE | Freq: Two times a day (BID) | ORAL | Status: DC
  Administered 2019-04-10 – 2019-04-13 (×6): 100 mg via ORAL
  Filled 2019-04-10 (×6): qty 1

## 2019-04-10 MED ORDER — ONDANSETRON HCL 4 MG/2ML IJ SOLN: 4.0000 mg | Freq: Four times a day (QID) | INTRAMUSCULAR | Status: DC | PRN

## 2019-04-10 MED ORDER — VITAMIN D 25 MCG (1000 UNIT) PO TABS
1000.0000 [IU] | ORAL_TABLET | Freq: Every day | ORAL | Status: DC
  Administered 2019-04-11 – 2019-04-13 (×3): 1000 [IU] via ORAL
  Filled 2019-04-10 (×3): qty 1

## 2019-04-10 MED ORDER — METOCLOPRAMIDE HCL 5 MG/ML IJ SOLN: 5.0000 mg | Freq: Three times a day (TID) | INTRAMUSCULAR | Status: DC | PRN

## 2019-04-10 MED ORDER — FENTANYL CITRATE (PF) 100 MCG/2ML IJ SOLN
25.0000 ug | INTRAMUSCULAR | Status: DC | PRN
  Administered 2019-04-10 (×2): 25 ug via INTRAVENOUS

## 2019-04-10 MED ORDER — BUPIVACAINE-EPINEPHRINE (PF) 0.5% -1:200000 IJ SOLN
INTRAMUSCULAR | Status: DC | PRN
  Administered 2019-04-10: 35 mL via PERINEURAL

## 2019-04-10 MED ORDER — ONDANSETRON HCL 4 MG/2ML IJ SOLN
INTRAMUSCULAR | Status: DC | PRN
  Administered 2019-04-10: 4 mg via INTRAVENOUS

## 2019-04-10 MED ORDER — ENOXAPARIN SODIUM 40 MG/0.4ML ~~LOC~~ SOLN
40.0000 mg | SUBCUTANEOUS | Status: DC
  Administered 2019-04-11 – 2019-04-13 (×3): 40 mg via SUBCUTANEOUS
  Filled 2019-04-10 (×3): qty 0.4

## 2019-04-10 MED ORDER — MELATONIN 5 MG PO TABS
5.0000 mg | ORAL_TABLET | Freq: Every evening | ORAL | Status: DC | PRN
  Administered 2019-04-11: 5 mg via ORAL
  Filled 2019-04-10: qty 1

## 2019-04-10 MED ORDER — CEFAZOLIN SODIUM-DEXTROSE 1-4 GM/50ML-% IV SOLN
1.0000 g | Freq: Once | INTRAVENOUS | Status: AC
  Administered 2019-04-10: 14:00:00 1 g via INTRAVENOUS
  Filled 2019-04-10: qty 50

## 2019-04-10 MED ORDER — METOCLOPRAMIDE HCL 10 MG PO TABS: 5.0000 mg | ORAL_TABLET | Freq: Three times a day (TID) | ORAL | Status: DC | PRN

## 2019-04-10 MED ORDER — DEXAMETHASONE SODIUM PHOSPHATE 10 MG/ML IJ SOLN
INTRAMUSCULAR | Status: DC | PRN
  Administered 2019-04-10: 5 mg via INTRAVENOUS

## 2019-04-10 MED ORDER — LIDOCAINE HCL (PF) 1 % IJ SOLN
INTRAMUSCULAR | Status: AC
  Filled 2019-04-10: qty 30

## 2019-04-10 MED ORDER — ADULT MULTIVITAMIN W/MINERALS CH
1.0000 | ORAL_TABLET | Freq: Every day | ORAL | Status: DC
  Administered 2019-04-11 – 2019-04-13 (×3): 1 via ORAL
  Filled 2019-04-10 (×3): qty 1

## 2019-04-10 SURGICAL SUPPLY — 27 items
ADH SKN CLS APL DERMABOND .7 (GAUZE/BANDAGES/DRESSINGS) ×1
BNDG ADH 2 X3.75 FABRIC TAN LF (GAUZE/BANDAGES/DRESSINGS) ×2 IMPLANT
BNDG ADH XL 3.75X2 STRCH LF (GAUZE/BANDAGES/DRESSINGS) ×1
CEMENT KYPHON CX01A KIT/MIXER (Cement) ×5 IMPLANT
COVER WAND RF STERILE (DRAPES) ×3 IMPLANT
DERMABOND ADVANCED (GAUZE/BANDAGES/DRESSINGS) ×2
DERMABOND ADVANCED .7 DNX12 (GAUZE/BANDAGES/DRESSINGS) ×1 IMPLANT
DEVICE BIOPSY BONE KYPHX (INSTRUMENTS) ×3 IMPLANT
DRAPE C-ARM XRAY 36X54 (DRAPES) ×3 IMPLANT
DURAPREP 26ML APPLICATOR (WOUND CARE) ×3 IMPLANT
FEE RENTAL RFA GENERATOR (MISCELLANEOUS) IMPLANT
GLOVE SURG SYN 9.0  PF PI (GLOVE) ×2
GLOVE SURG SYN 9.0 PF PI (GLOVE) ×1 IMPLANT
GOWN SRG 2XL LVL 4 RGLN SLV (GOWNS) ×1 IMPLANT
GOWN STRL NON-REIN 2XL LVL4 (GOWNS) ×3
GOWN STRL REUS W/ TWL LRG LVL3 (GOWN DISPOSABLE) ×1 IMPLANT
GOWN STRL REUS W/TWL LRG LVL3 (GOWN DISPOSABLE) ×3
KIT OSTEOCOOL BONE ACCESS 10G (MISCELLANEOUS) ×2 IMPLANT
PACK KYPHOPLASTY (MISCELLANEOUS) ×3 IMPLANT
RENTAL RFA  GENERATOR (MISCELLANEOUS)
RENTAL RFA GENERATOR (MISCELLANEOUS) IMPLANT
STRAP SAFETY 5IN WIDE (MISCELLANEOUS) ×3 IMPLANT
SYS CARTRIDGE BONE CEMENT 8ML (SYSTAGENIX WOUND MANAGEMENT) ×3
SYSTEM CARTRIDG BONE CEMNT 8ML (SYSTAGENIX WOUND MANAGEMENT) IMPLANT
SYSTEM GUN BONE FILLER SZ2 (MISCELLANEOUS) ×2 IMPLANT
TRAY KYPHOPAK 15/3 EXPRESS 1ST (MISCELLANEOUS) ×3 IMPLANT
TRAY KYPHOPAK 20/3 EXPRESS 1ST (MISCELLANEOUS) ×1 IMPLANT

## 2019-04-10 NOTE — Transfer of Care (Signed)
Immediate Anesthesia Transfer of Care Note  Patient: Melissa [name]  Procedure(s) Performed: KYPHOPLASTY (N/A Back)  Patient Location: PACU  Anesthesia Type:General  Level of Consciousness: drowsy and patient cooperative  Airway & Oxygen Therapy: Patient Spontanous Breathing  Post-op Assessment: Report given to RN and Post -op Vital signs reviewed and stable  Post vital signs: Reviewed and stable  Last Vitals:  Vitals Value Taken Time  BP 140/62 04/10/19 1431  Temp    Pulse 92 04/10/19 1435  Resp 17 04/10/19 1435  SpO2 94 % 04/10/19 1435  Vitals shown include unvalidated device data.  Last Pain:  Vitals:   04/10/19 1223  TempSrc: Tympanic  PainSc: 3       Patients Stated Pain Goal: 2 ([id number])  Complications: No apparent anesthesia complications

## 2019-04-10 NOTE — Op Note (Signed)
Date 04/10/2019  time 2:36 PM   PATIENT:  Melissa [name]   PRE-OPERATIVE DIAGNOSIS:  closed wedge compression fracture of L1 with sacral insufficiency fracture   POST-OPERATIVE DIAGNOSIS:  closed wedge compression fracture of L1 with sacral insufficiency fracture   PROCEDURE:  Procedure(s): KYPHOPLASTY of L1 with sacral plasty  SURGEON: Laurene Footman, MD   ASSISTANTS: None   ANESTHESIA:   local and MAC   EBL:  No intake/output data recorded.   BLOOD ADMINISTERED:none   DRAINS: none    LOCAL MEDICATIONS USED:  MARCAINE    and XYLOCAINE    SPECIMEN:   L1 vertebral body biopsy   DISPOSITION OF SPECIMEN:  Pathology   COUNTS:  YES   TOURNIQUET:  * No tourniquets in log *   IMPLANTS: Bone cement   DICTATION: .Dragon Dictation  patient was brought to the operating room and after adequate anesthesia was obtained the patient was placed prone.  C arm was brought in in good visualization of the affected level obtained on both AP and lateral projections.  After patient identification and timeout procedures were completed, local anesthetic was infiltrated with 10 cc 1% Xylocaine infiltrated subcutaneously.  This is done the area on the each side side of the planned approach.  The back was then prepped and draped in the usual sterile manner and repeat timeout procedure carried out.  A spinal needle was brought down to the pedicle on the each side of  L1 and a 50-50 mix of 1% Xylocaine half percent Sensorcaine with epinephrine total of 20 cc injected.  After allowing this to set a small incision was made and the trocar was advanced into the vertebral body in an transpedicular fashion.  Biopsy was obtained Drilling was carried out balloon inserted with inflation to  3 cc on each side.  When the cement was appropriate consistency 6-1/2 cc were injected into the vertebral body without extravasation, good fill superior to inferior endplates and from right to left sides along the inferior endplate.   After the cement had set the trochar was removed and permanent C-arm views obtained.  The sacrum was then approached first giving the local mixture was noted above 50-50 mix 15 cc on both sides.  Small incisions were made and the trocar was advanced into the S1 care being taken on both AP lateral images to seal the sacral foramina and SI joint.  Cement was then injected into each side with multiple images taken to be certain it did not extravasate.  Approximately 2-1/2 cc on each side with good fill of S1 and S2.    The wounds were closed with Dermabond followed by Band-Aids   PLAN OF CARE:  Continue as observation   PATIENT DISPOSITION:  PACU - hemodynamically stable.

## 2019-04-10 NOTE — Progress Notes (Signed)
Orthopedic Tech Progress Note Patient Details:  Melissa [name] 1929-01-29 [id number] Called in order to [facility] for a TLSO if not a LSO PER MD MICHAEL MENZ. RN called requesting the order to be STAT but That order went in over 24 hours ago and I'm just now being called. Patient ID: Melissa [name], female   DOB: 1928/12/15, 86 y.o.   MRN: [id number]   [name] 04/10/2019, 4:29 PM

## 2019-04-10 NOTE — Progress Notes (Signed)
[facility] Progress Note  Patient: Melissa [name]    [id number]  DOA: 04/09/2019     Date of Service: the patient was seen and examined on 04/10/2019  Chief Complaint  Patient presents with  . Back Pain   Brief hospital course: Melissa [name] is a 86 y.o. female with medical history significant for GERD, who presents to the emergency room with low back pain radiating to the right leg, not improving with outpatient prednisone treatment possible sciatica.  Patient states that she has had back pain for several years but over the past 1 month the pain has been more debilitating, preventing her from tending to her usual chores.  At baseline she is independent in her ADLs but of late has needed a Rollator to walk and increasing assistance from her husband.  Pain is described as severe and sharp in nature and worse with changes of position.  Radiates down the left leg.  She denies saddle anesthesia, bowel or bladder retention.  She denies fever or chills.  Had no recent falls or injury ED Course: On arrival in the emergency room, vitals were mostly within normal limits.  She had a CT of her lumbar spine which was followed up with a noncontrasted MRI that showed severe L1 compression fracture with significant retropulsion and new moderate spinal stenosis with moderate to severe bilateral foraminal stenosis and mild mass-effect on the conus but no associated lower cord signal abnormality.  It also showed superimposed acute to subacute sacral fractures which may be insufficiency fractures in the absence of recent fall or trauma.  Urgency room provider contacted neurosurgeon, Dr. [name] who recommended lumbar spinal orthotic for pain, admission for pain control and recommended keeping n.p.o. overnight for possible sacral plasty by Dr. [name] in the a.m.  Blood work as well as urinalysis pending at the time of decision to admit.   Currently further plan is continue pain control medications, follow  orthopedics for disposition and PT OT eval.  Assessment and Plan: Principal Problem:   Intractable low back pain due to L1 Acute compression fracture, nontraumatic,with retropulsion and Sacral insufficiency fracture - pain control -s/p L1 kyphoplasty done by orthopedics Dr. [name] on 04/10/2019  -Oxycodone for pain with IV Toradol as well as IV morphine for breakthrough -Order placed to arrange placement of LSO -Follow orthopedics for further recommendation Follow PT/OT eval for disposition  Anemia --Baseline since 2015 --continue to monitor   GERD -Continue home omeprazole   Body mass index is 20.02 kg/m.  Interventions:  Diet: Regular DVT Prophylaxis: Subcutaneous Lovenox   Advance goals of care discussion: DNR  Family Communication: No one from family was present at bedside, at the time of interview.  The pt provided permission to discuss medical plan with the family. Opportunity was given to ask question and all questions were answered satisfactorily.   Disposition:  Pt is from Home, admitted with intractable back pain due to L1 compression fracture, still has intractable back pain, status post kyphoplasty done on 04/10/2019, which precludes a safe discharge. Discharge to most likely SNF, when pain is under control, cleared by orthopedics and PT OT eval done.  Subjective:  Patient was seen and examined at bedside, stated that her back pain is under control after pain medications.  Back pain 2/10, it was 5/10 when she came in.  Patient denies any chest pain or crepitations, no shortness of breath no abdominal pain no nausea vomiting or diarrhea.  Physical Exam: General:  alert oriented to time,  place, and person.  Appear in moderate distress, affect appropriate Eyes: PERRLA ENT: Oral Mucosa Clear, moist  Neck: no JVD,  Cardiovascular: S1 and S2 Present, no Murmur,  Respiratory: good respiratory effort, Bilateral Air entry equal and Decreased, no Crackles, no  wheezes Abdomen: Bowel Sound present, Soft and no tenderness,  Skin: no rashes Extremities: no Pedal edema, no calf tenderness Neurologic: without any new focal findings and mental status, alert and oriented x3, lumbar spinal tenderness Gait not checked due to patient safety concerns  Vitals:   04/10/19 1531 04/10/19 1558 04/10/19 1631 04/10/19 1746  BP: (!) 147/62 (!) 151/66 (!) 142/68 140/72  Pulse: 77 79 76 80  Resp: 11 13 14 17   Temp: 97.6 F (36.4 C) 97.6 F (36.4 C) (!) 97.4 F (36.3 C) 98 F (36.7 C)  TempSrc:  Oral Oral Oral  SpO2: 96% 92% 97% 98%  Weight:      Height:        Intake/Output Summary (Last 24 hours) at 04/10/2019 1815 Last data filed at 04/10/2019 1543 Gross per 24 hour  Intake 1050 ml  Output 76 ml  Net 974 ml   Filed Weights   04/09/19 1440  Weight: 51.3 kg    Data Reviewed: I have personally reviewed and interpreted daily labs, tele strips, imagings as discussed above. I reviewed all nursing notes, pharmacy notes, vitals, pertinent old records I have discussed plan of care as described above with RN and patient/family.  CBC: Recent Labs  Lab 04/09/19 2043 04/10/19 1100  WBC 7.3 7.0  NEUTROABS 4.8  --   HGB 10.0* 8.8*  HCT 30.5* 26.9*  MCV 97.4 99.3  PLT 464* [id number]*   Basic Metabolic Panel: Recent Labs  Lab 04/09/19 2043 04/10/19 1100  NA 137 138  K 3.7 3.7  CL 101 103  CO2 25 28  GLUCOSE 127* 91  BUN 21 20  CREATININE 0.51 0.59  CALCIUM 8.7* 8.1*  MG  --  2.2  PHOS  --  3.5    Studies: DG Lumbar Spine 2-3 Views  Result Date: 04/10/2019 CLINICAL DATA:  L1 kyphoplasty and sacroplasty. EXAM: LUMBAR SPINE - 2-3 VIEW; DG C-ARM 1-60 MIN COMPARISON:  None. FINDINGS: Examination demonstrates expected changes after patient's bilateral sacroplasty and L1 kyphoplasty. Moderate degenerative change of the lumbosacral spine. IMPRESSION: Expected changes post L1 kyphoplasty and bilateral sacroplasty. Electronically Signed   By: [name] M.D.   On: 04/10/2019 14:43   DG C-Arm 1-60 Min  Result Date: 04/10/2019 CLINICAL DATA:  L1 kyphoplasty and sacroplasty. EXAM: LUMBAR SPINE - 2-3 VIEW; DG C-ARM 1-60 MIN COMPARISON:  None. FINDINGS: Examination demonstrates expected changes after patient's bilateral sacroplasty and L1 kyphoplasty. Moderate degenerative change of the lumbosacral spine. IMPRESSION: Expected changes post L1 kyphoplasty and bilateral sacroplasty. Electronically Signed   By: [name] M.D.   On: 04/10/2019 14:43    Scheduled Meds: . cholecalciferol  1,000 Units Oral Daily  . docusate sodium  100 mg Oral BID  . [START ON 04/11/2019] enoxaparin (LOVENOX) injection  40 mg Subcutaneous Q24H  . multivitamin with minerals  1 tablet Oral Daily  . pantoprazole  40 mg Oral Daily  . predniSONE  20 mg Oral Q0600   Continuous Infusions: . sodium chloride 600 mL/hr at 04/10/19 1422   PRN Meds: acetaminophen **OR** acetaminophen, bisacodyl, ketorolac, magnesium citrate, magnesium hydroxide, metoCLOPramide **OR** metoCLOPramide (REGLAN) injection, morphine injection, ondansetron **OR** ondansetron (ZOFRAN) IV, oxyCODONE, senna-docusate  Time spent: 35 minutes  Author: Fabio Neighbors  Dwyane Dee MD [facility] 04/10/2019 6:15 PM  To reach On-call, see care teams to locate the attending and reach out to them via [email or web address]. If 7PM-7AM, please contact night-coverage If you still have difficulty reaching the attending provider, please page the [facility] (Director on Call) for [facility] on amion for assistance.

## 2019-04-10 NOTE — Discharge Instructions (Signed)
Balloon Kyphoplasty, Care After This sheet gives you information about how to care for yourself after your procedure. Your health care provider may also give you more specific instructions. If you have problems or questions, contact your health care provider. What can I expect after the procedure? After your procedure, it is common to have back pain. Follow these instructions at home: Medicines  Take over-the-counter and prescription medicines only as told by your health care provider.  Ask your health care provider if the medicine prescribed to you: ? Requires you to avoid driving or using heavy machinery. ? Can cause constipation. You may need to take steps to prevent or treat constipation, such as:  Drink enough fluid to keep your urine pale yellow.  Take over-the-counter or prescription medicines.  Eat foods that are high in fiber, such as beans, whole grains, and fresh fruits and vegetables.  Limit foods that are high in fat and processed sugars, such as fried or sweet foods. Puncture site care   Follow instructions from your health care provider about how to take care of your puncture site. Make sure you: ? Wash your hands with soap and water before and after you change your bandage (dressing). If soap and water are not available, use hand sanitizer. ? Change your dressing as told by your health care provider. ? Leave skin glue or adhesive strips in place. These skin closures may need to be in place for 2 weeks or longer. If adhesive strip edges start to loosen and curl up, you may trim the loose edges. Do not remove adhesive strips completely unless your health care provider tells you to do that.  Check your puncture site every day for signs of infection. Watch for: ? Redness, swelling, or pain. ? Fluid or blood. ? Warmth. ? Pus or a bad smell.  Keep your dressing dry until your health care provider says that it can be removed. Managing pain, stiffness, and swelling   If  directed, put ice on the painful area. ? Put ice in a plastic bag. ? Place a towel between your skin and the bag. ? Leave the ice on for 20 minutes, 2-3 times a day. Activity  Rest your back and avoid intense physical activity for as long as told by your health care provider.  Avoid bending, lifting, or twisting your back for as long as told by your health care provider.  Return to your normal activities as told by your health care provider. Ask your health care provider what activities are safe for you.  Do not lift anything that is heavier than 5 lb (2.2 kg). You may need to avoid heavy lifting for several weeks. General instructions  Do not use any products that contain nicotine or tobacco, such as cigarettes, e-cigarettes, and chewing tobacco. These can delay bone healing. If you need help quitting, ask your health care provider.  Do not drive for 24 hours if you were given a sedative during your procedure.  Keep all follow-up visits as told by your health care provider. This is important. Contact a health care provider if:  You have a fever or chills.  You have redness, swelling, or pain at the site of your puncture.  You have fluid, blood, or pus coming from the puncture site.  You have pain that gets worse or does not get better with medicine.  You develop numbness or weakness in any part of your body. Get help right away if:  You have chest pain.  You have difficulty breathing.  You have weakness, numbness, or tingling in your legs.  You cannot control your bladder or bowel movements.  You suddenly become weak or numb on one side of your body.  You become very confused.  You have trouble speaking or understanding, or both. Summary  Follow instructions from your health care provider about how to take care of your puncture site.  Take over-the-counter and prescription medicines only as told by your health care provider.  Rest your back and avoid intense  physical activity for as long as told by your health care provider.  Contact a health care provider if you have pain that gets worse or does not get better with medicine.  Keep all follow-up visits as told by your health care provider. This is important. This information is not intended to replace advice given to you by your health care provider. Make sure you discuss any questions you have with your health care provider. Document Revised: 01/13/2018 Document Reviewed: 01/13/2018 Elsevier Patient Education  2020 [place] on Sunday.  Okay to shower after that. Try not to bend at the waist or lift over 5 pounds for 2 weeks or as the least as little as possible. Encourage you to try to walk around as much as you can tolerate. Call our office if you are having problems [phone] You should get a follow-up appointment with me in 2 weeks.  Appointment set for 3/5 10:00

## 2019-04-10 NOTE — Consult Note (Signed)
Referring Physician:  No referring provider defined for this encounter.  Primary Physician:  [name], MD  Chief Complaint: Back pain and left leg pain  History of Present Illness: Melissa [name] is a 86 y.o. female who presents with the chief complaint of back pain and left leg pain.  Left leg pain has been present for "quite some time" but has been progressively worsening and accompanied with severe back pain over the last week.  No inciting event noted. Feels that the pain is too bad for weight bearing at this time. She has been receiving pain medication and feels that back pain has improved but intermittent leg pain remains persistent.   Presented to emergency department due to worsening pain complaints.  MRI completed and revealed new L1 compression fracture with retropulsion, stable L4 compression fracture since January, and sacral fractures.  Denies bladder/bowel dysfunction, saddle paresthesia, right lower extremity complaints, recent falls/trauma  Review of Systems:  A 10 point review of systems is negative, except for the pertinent positives and negatives detailed in the HPI.  Past Medical History: Past Medical History:  Diagnosis Date  . Arthritis   . Chest pain    a. 05/2015 - post-prandial.  . GERD (gastroesophageal reflux disease)    a. takes Omeprazole daily, prev seen by D. Brodie.  Marland Kitchen Heart murmur    a. 05/2013 Echo: EF 55-60%, gr2 DD, mild AI/MR.  Marland Kitchen History of blood transfusion    no abnormal reaction noted   . History of migraine headaches    a. last 10/2013  . Iron deficiency anemia   . Joint pain   . PONV (postoperative nausea and vomiting)   . Rectocele 2011    Past Surgical History: Past Surgical History:  Procedure Laterality Date  . cataract surgery  Bilateral    R 03/2001; L 05/2001.  Marland Kitchen COLONOSCOPY    . HYSTEROSCOPY  11/1999   w/ resection of endometrial polyp by uterine curetting.  Marland Kitchen JOINT REPLACEMENT Bilateral R-9/04, L- 8/05   Total Knee  Arthroplasties.  Marland Kitchen REVERSE SHOULDER ARTHROPLASTY Right 11/26/2013   Procedure: RIGHT REVERSE SHOULDER ARTHROPLASTY;  Surgeon: Marin Shutter, MD;  Location: [facility];  Service: Orthopedics;  Laterality: Right;  . SHOULDER ARTHROSCOPY Right   . THYROIDECTOMY  at age 54  . TONSILLECTOMY      Allergies: Allergies as of 04/09/2019 - Review Complete 04/09/2019  Allergen Reaction Noted  . Sulfonamide derivatives Hives     Medications:  Current Facility-Administered Medications:  .  0.9 %  sodium chloride infusion, , Intravenous, Continuous, [name], MD, Last Rate: 50 mL/hr at 04/10/19 0858, New Bag at 04/10/19 0858 .  acetaminophen (TYLENOL) tablet 650 mg, 650 mg, Oral, Q6H PRN **OR** acetaminophen (TYLENOL) suppository 650 mg, 650 mg, Rectal, Q6H PRN, [name], MD .  bisacodyl (DULCOLAX) EC tablet 5 mg, 5 mg, Oral, Daily PRN, Judd Gaudier V, MD .  ceFAZolin (ANCEF) IVPB 1 g/50 mL premix, 1 g, Intravenous, Once, Hessie Knows, MD .  ketorolac (TORADOL) 15 MG/ML injection 15 mg, 15 mg, Intravenous, Q6H PRN, [name], MD, 15 mg at 04/10/19 0118 .  morphine 2 MG/ML injection 2 mg, 2 mg, Intravenous, Q3H PRN, [name], MD, 2 mg at 04/09/19 2344 .  ondansetron (ZOFRAN) tablet 4 mg, 4 mg, Oral, Q6H PRN **OR** ondansetron (ZOFRAN) injection 4 mg, 4 mg, Intravenous, Q6H PRN, Judd Gaudier V, MD .  oxyCODONE (Oxy IR/ROXICODONE) immediate release tablet 5 mg, 5 mg,  Oral, Q4H PRN, [name], MD, 5 mg at 04/09/19 2203 .  senna-docusate (Senokot-S) tablet 1 tablet, 1 tablet, Oral, QHS PRN, [name], MD  Current Outpatient Medications:  .  cholecalciferol (VITAMIN D) 1000 units tablet, Take 1,000 Units by mouth daily., Disp: , Rfl:  .  Multiple Vitamin (MULTIVITAMIN WITH MINERALS) TABS tablet, Take 1 tablet by mouth daily., Disp: , Rfl:  .  mupirocin ointment (BACTROBAN) 2 %, Apply to wound after soaking BID, Disp: 30 g, Rfl: 1 .  omeprazole (PRILOSEC OTC) 20 MG  tablet, Take 40 mg by mouth daily. , Disp: , Rfl:  .  predniSONE (DELTASONE) 20 MG tablet, Take 20-40 mg by mouth daily at 6 (six) AM., Disp: , Rfl:  .  Probiotic Product (PROBIOTIC DAILY PO), Take 1 tablet by mouth daily at 6 (six) AM. , Disp: , Rfl:  .  trolamine salicylate (ASPERCREME) 10 % cream, Apply 1 application topically as needed for muscle pain., Disp: , Rfl:    Social History: Social History   Tobacco Use  . Smoking status: Never Smoker  . Smokeless tobacco: Never Used  Substance Use Topics  . Alcohol use: No  . Drug use: No    Family Medical History: Family History  Problem Relation Age of Onset  . Heart attack Mother        died @ 110  . Heart attack Brother        died @ 25  . Heart attack Brother        died @ 40  . Other Father        died suddenly @ 50.    Physical Examination: Vitals:   04/10/19 0800 04/10/19 0900  BP: (!) 163/66 (!) 145/71  Pulse:    Resp: 18   Temp: 98.6 F (37 C)   SpO2:       General: Patient is well developed, well nourished, calm, collected, and in no apparent distress. Laying in hospital bed  Psychiatric: Patient is non-anxious.  Head:  Pupils equal, round, and reactive to light.  ENT:  Oral mucosa appears well hydrated.  Neck:   Supple.  Full range of motion.  Respiratory: Patient is breathing without any difficulty.  Extremities: No edema.  Skin:   On exposed skin, there are no abnormal skin lesions.  NEUROLOGICAL:  General: In no acute distress.   Awake, alert, oriented to person, place, and time.   Facial tone is symmetric.   ROM of spine: not assessed    Strength:  Side Iliopsoas Quads Hamstring PF DF EHL  R 5 5 5 5 5 5   L 4- 5 4+ 5 5 5   Pain limited weakness  Reflexes are 2+ and symmetric at the patella and achilles.   Bilateral lower extremity sensation is intact to light touch.  Clonus is not present.  Gait not assessed  Imaging: EXAM: MRI LUMBAR SPINE WITHOUT  CONTRAST 04/09/2019  FINDINGS: Segmentation:  Normal, same numbering system as on the comparisons.  Alignment: Stable from the CT earlier today. Retropulsion at L1 is new since January, described below. Stable chronic grade 1 anterolisthesis at L4-L5 and L5-S1.  Vertebrae: Marrow edema throughout the severely compressed L1 vertebral body, new since 03/18/2018. Retropulsion of the posteroinferior endplate as seen by CT today resulting in moderate spinal stenosis with up to mild mass effect on the conus (series 9, image 9). No marrow edema in the L1 posterior elements.  Chronic T12 compression fracture is stable. The mild  L4 compression fracture has not progressed since January marrow edema there has resolved.  Also new since January confluent bilateral sacral ala marrow edema (series 11, image 14 on the left and image 2 on the right. There is an associated nondisplaced appearing fracture through the central S2-S3 level. Associated presacral fluid or edema.  Faint posterior endplate marrow edema at T12 inferiorly and L2-L3 appears to be degenerative in nature.  Conus medullaris and cauda equina: Conus extends to the L1 level, as stated above. No signal abnormality in the visible lower thoracic spinal cord or conus.  Paraspinal and other soft tissues: Mild presacral fluid or edema as stated above. Negative visible abdominal viscera.  Disc levels:  Widespread lower thoracic and lumbar spine degeneration is stable since last month with the following exceptions:  L1-L2: Bulky posterior disc osteophyte complex in large part due to the L1 posteroinferior endplate retropulsion results in new spinal stenosis with up to mild mass effect on the conus. There is associated new moderate to severe bilateral L1 foraminal stenosis.  IMPRESSION: 1. Severe L1 compression fracture as seen by CT today is new since 03/18/2018. Significant retropulsion results in new moderate  spinal stenosis and moderate to severe bilateral foraminal stenosis. Up to mild mass effect on the conus, but no associated lower cord signal abnormality.  2. Superimposed acute to subacute sacral fractures: Bilateral sacral ala and central S2-S3 segment. These may be insufficiency fractures in the absence of recent fall/trauma.  3. Mild L4 compression fracture remain stable since January. Chronic T12 compression fractures.  EXAM: CT LUMBAR SPINE WITHOUT CONTRAST 04/09/2019  IMPRESSION: 1. New compression fracture at L1 with retropulsed fragment causing moderate spinal canal stenosis. 2. Multilevel degenerative changes, more pronounced at L4-5 where there is grade 1 anterolisthesis related to prominent facet degeneration, moderate spinal canal stenosis and moderate bilateral neural foraminal narrowing. 3. Unchanged compression fractures at T11 and L4.  Aortic Atherosclerosis (ICD10-I70.0).  Assessment and Plan: Ms. Selvaggio is a pleasant 86 y.o. female with new L1 compression fracture with retropulsion, sacral fractures, and stable L4 compression fracture from January.  No neurological deficits noted.  Dr. [name] has evaluated the patient and has plans to proceed with L1 kyphoplasty and sacroplasty.  She may follow-up with neurosurgery as needed.   [name], PA-C Dept. of Neurosurgery

## 2019-04-10 NOTE — Anesthesia Preprocedure Evaluation (Signed)
Anesthesia Evaluation  Patient identified by MRN, date of birth, ID band Patient awake    Reviewed: Allergy & Precautions, NPO status , Patient's Chart, lab work & pertinent test results  History of Anesthesia Complications (+) PONV and history of anesthetic complications  Airway Mallampati: II  TM Distance: >3 FB Neck ROM: Full    Dental no notable dental hx.    Pulmonary neg pulmonary ROS, neg sleep apnea, neg COPD,    breath sounds clear to auscultation- rhonchi (-) wheezing      Cardiovascular (-) hypertension(-) CAD, (-) Past MI, (-) Cardiac Stents and (-) CABG  Rhythm:Regular Rate:Normal - Systolic murmurs and - Diastolic murmurs    Neuro/Psych neg Seizures TIA (L sided weakness)negative psych ROS   GI/Hepatic Neg liver ROS, GERD  ,  Endo/Other  negative endocrine ROSneg diabetes  Renal/GU negative Renal ROS     Musculoskeletal  (+) Arthritis ,   Abdominal (+) - obese,   Peds  Hematology  (+) anemia ,   Anesthesia Other Findings Past Medical History: No date: Arthritis No date: Chest pain     Comment:  a. 05/2015 - post-prandial. No date: GERD (gastroesophageal reflux disease)     Comment:  a. takes Omeprazole daily, prev seen by D. Brodie. No date: Heart murmur     Comment:  a. 05/2013 Echo: EF 55-60%, gr2 DD, mild AI/MR. No date: History of blood transfusion     Comment:  no abnormal reaction noted  No date: History of migraine headaches     Comment:  a. last 10/2013 No date: Iron deficiency anemia No date: Joint pain No date: PONV (postoperative nausea and vomiting) 2011: Rectocele   Reproductive/Obstetrics                             Anesthesia Physical Anesthesia Plan  ASA: III  Anesthesia Plan: General   Post-op Pain Management:    Induction: Intravenous  PONV Risk Score and Plan: 3 and Propofol infusion  Airway Management Planned: Natural Airway  Additional  Equipment:   Intra-op Plan:   Post-operative Plan:   Informed Consent: I have reviewed the patients History and Physical, chart, labs and discussed the procedure including the risks, benefits and alternatives for the proposed anesthesia with the patient or authorized representative who has indicated his/her understanding and acceptance.     Dental advisory given  Plan Discussed with: CRNA and Anesthesiologist  Anesthesia Plan Comments:         Anesthesia Quick Evaluation

## 2019-04-10 NOTE — Consult Note (Addendum)
Reason for Consult: L1 fracture compression and sacral insufficiency fractures Referring Physician: Dr Harriette Ohara is an 86 y.o. female.  HPI: Patient has been having increasingly severe low back pain.  Pain rating to the leg.  She has had outpatient prednisone and had MRI that shows a severe L1 compression fracture that is acute as well as sacral insufficiency fractures.  She reports that she normally is a ambulator with rolling walker and lives in a home at [facility].  Her pain is severe and right now she has trouble getting out of bed at all.  Past Medical History:  Diagnosis Date  . Arthritis   . Chest pain    a. 05/2015 - post-prandial.  . GERD (gastroesophageal reflux disease)    a. takes Omeprazole daily, prev seen by D. Brodie.  Marland Kitchen Heart murmur    a. 05/2013 Echo: EF 55-60%, gr2 DD, mild AI/MR.  Marland Kitchen History of blood transfusion    no abnormal reaction noted   . History of migraine headaches    a. last 10/2013  . Iron deficiency anemia   . Joint pain   . PONV (postoperative nausea and vomiting)   . Rectocele 2011    Past Surgical History:  Procedure Laterality Date  . cataract surgery  Bilateral    R 03/2001; L 05/2001.  Marland Kitchen COLONOSCOPY    . HYSTEROSCOPY  11/1999   w/ resection of endometrial polyp by uterine curetting.  Marland Kitchen JOINT REPLACEMENT Bilateral R-9/04, L- 8/05   Total Knee Arthroplasties.  Marland Kitchen REVERSE SHOULDER ARTHROPLASTY Right 11/26/2013   Procedure: RIGHT REVERSE SHOULDER ARTHROPLASTY;  Surgeon: Marin Shutter, MD;  Location: [place];  Service: Orthopedics;  Laterality: Right;  . SHOULDER ARTHROSCOPY Right   . THYROIDECTOMY  at age 37  . TONSILLECTOMY      Family History  Problem Relation Age of Onset  . Heart attack Mother        died @ 16  . Heart attack Brother        died @ 17  . Heart attack Brother        died @ 59  . Other Father        died suddenly @ 7.    Social History:  reports that she has never smoked. She has never used smokeless  tobacco. She reports that she does not drink alcohol or use drugs.  Allergies:  Allergies  Allergen Reactions  . Sulfonamide Derivatives Hives    Medications: I have reviewed the patient's current medications.  Results for orders placed or performed during the hospital encounter of 04/09/19 (from the past 48 hour(s))  Urinalysis, Complete w Microscopic     Status: Abnormal   Collection Time: 04/09/19  3:36 PM  Result Value Ref Range   Color, Urine YELLOW (A) YELLOW   APPearance CLEAR (A) CLEAR   Specific Gravity, Urine 1.015 1.005 - 1.030   pH 6.0 5.0 - 8.0   Glucose, UA NEGATIVE NEGATIVE mg/dL   Hgb urine dipstick NEGATIVE NEGATIVE   Bilirubin Urine NEGATIVE NEGATIVE   Ketones, ur NEGATIVE NEGATIVE mg/dL   Protein, ur NEGATIVE NEGATIVE mg/dL   Nitrite NEGATIVE NEGATIVE   Leukocytes,Ua NEGATIVE NEGATIVE   RBC / HPF 0-5 0 - 5 RBC/hpf   WBC, UA 0-5 0 - 5 WBC/hpf   Bacteria, UA NONE SEEN NONE SEEN   Squamous Epithelial / LPF 0-5 0 - 5   Mucus PRESENT     Comment: Performed at [facility]  Lab, [address] [place], [place] [address]  Comprehensive metabolic panel     Status: Abnormal   Collection Time: 04/09/19  8:43 PM  Result Value Ref Range   Sodium 137 135 - 145 mmol/L   Potassium 3.7 3.5 - 5.1 mmol/L   Chloride 101 98 - 111 mmol/L   CO2 25 22 - 32 mmol/L   Glucose, Bld 127 (H) 70 - 99 mg/dL   BUN 21 8 - 23 mg/dL   Creatinine, Ser 0.51 0.44 - 1.00 mg/dL   Calcium 8.7 (L) 8.9 - 10.3 mg/dL   Total Protein 7.4 6.5 - 8.1 g/dL   Albumin 3.8 3.5 - 5.0 g/dL   AST 26 15 - 41 U/L   ALT 30 0 - 44 U/L   Alkaline Phosphatase 185 (H) 38 - 126 U/L   Total Bilirubin 0.7 0.3 - 1.2 mg/dL   GFR calc non Af Amer >60 >60 mL/min   GFR calc Af Amer >60 >60 mL/min   Anion gap 11 5 - 15    Comment: Performed at [facility], [address] [place], [place] [address]  CBC with Differential     Status: Abnormal   Collection Time: 04/09/19  8:43 PM  Result Value Ref  Range   WBC 7.3 4.0 - 10.5 K/uL   RBC 3.13 (L) 3.87 - 5.11 MIL/uL   Hemoglobin 10.0 (L) 12.0 - 15.0 g/dL   HCT 30.5 (L) 36.0 - 46.0 %   MCV 97.4 80.0 - 100.0 fL   MCH 31.9 26.0 - 34.0 pg   MCHC 32.8 30.0 - 36.0 g/dL   RDW 17.8 (H) 11.5 - 15.5 %   Platelets 464 (H) 150 - 400 K/uL   nRBC 0.4 (H) 0.0 - 0.2 %   Neutrophils Relative % 66 %   Neutro Abs 4.8 1.7 - 7.7 K/uL   Lymphocytes Relative 21 %   Lymphs Abs 1.6 0.7 - 4.0 K/uL   Monocytes Relative 12 %   Monocytes Absolute 0.8 0.1 - 1.0 K/uL   Eosinophils Relative 0 %   Eosinophils Absolute 0.0 0.0 - 0.5 K/uL   Basophils Relative 0 %   Basophils Absolute 0.0 0.0 - 0.1 K/uL   Immature Granulocytes 1 %   Abs Immature Granulocytes 0.05 0.00 - 0.07 K/uL    Comment: Performed at [facility], [address] [place], [place] [address]  SARS CORONAVIRUS 2 (TAT 6-24 HRS) Nasopharyngeal Nasopharyngeal Swab     Status: None   Collection Time: 04/09/19  8:43 PM   Specimen: Nasopharyngeal Swab  Result Value Ref Range   SARS Coronavirus 2 NEGATIVE NEGATIVE    Comment: (NOTE) SARS-CoV-2 target nucleic acids are NOT DETECTED. The SARS-CoV-2 RNA is generally detectable in upper and lower respiratory specimens during the acute phase of infection. Negative results do not preclude SARS-CoV-2 infection, do not rule out co-infections with other pathogens, and should not be used as the sole basis for treatment or other patient management decisions. Negative results must be combined with clinical observations, patient history, and epidemiological information. The expected result is Negative. Fact Sheet for Patients: SugarRoll.be Fact Sheet for Healthcare Providers: https://www.woods-mathews.com/ This test is not yet approved or cleared by the [place] FDA and  has been authorized for detection and/or diagnosis of SARS-CoV-2 by FDA under an Emergency Use Authorization (EUA). This EUA will  remain  in effect (meaning this test can be used) for the duration of the COVID-19 declaration under Section 56 4(b)(1)  of the Act, 21 U.S.C. section 360bbb-3(b)(1), unless the authorization is terminated or revoked sooner. Performed at [facility], [address], [place], [place] [address]     CT Lumbar Spine Wo Contrast  Result Date: 04/09/2019 CLINICAL DATA:  Lumbar radiculopathy EXAM: CT LUMBAR SPINE WITHOUT CONTRAST TECHNIQUE: Multidetector CT imaging of the lumbar spine was performed without intravenous contrast administration. Multiplanar CT image reconstructions were also generated. COMPARISON:  Plain films 05/22/2018. FINDINGS: Segmentation: 5 lumbar type vertebrae. Alignment: Minimal retrolisthesis of T11 over T12. Grade I anterolisthesis of L4 over L5 related to degenerative facet disease. Vertebrae: Compression fracture noted at L1 with flattening of the anterior two thirds of the vertebral body and retropulsion of the posterior wall into the spinal canal resulting in moderate spinal canal stenosis. This is new from prior plain films. Compression fractures of the T11 and L4 vertebral bodies are again seen and appear unchanged. Diffuse decrease of the bone density. Paraspinal and other soft tissues: Calcified plaques noted in the abdominal aorta. Disc levels: T10-T11: Mild retropulsion of the superior endplate resulting in mild spinal canal stenosis. T11-T12: Posterior disc protrusion with endplate spurring and facet degenerative changes causing mild spinal canal narrowing and mild bilateral neural foraminal stenosis. T12-L1: Calcified posterior disc protrusion and facet degenerative changes causing mild spinal canal stenosis. No significant neural foraminal narrowing. L1-L2: Compression fracture as described above with retropulsed fragment causing moderate spinal canal stenosis and extending into the bilateral neural foramen resulting in moderate bilateral stenosis. L2-3: Posterior  disc protrusion and facet degenerative changes resulting in mild spinal canal stenosis and mild bilateral neural foraminal narrowing. L3-4: Disc bulge, facet degenerative changes and ligamentum flavum redundancy resulting mild spinal canal stenosis and moderate bilateral neural foraminal narrowing. L4-5: Disc bulge, prominent facet degenerative changes and ligamentum flavum redundancy resulting in moderate spinal canal stenosis and moderate bilateral neural foraminal narrowing. L5-S1: Disc bulge and moderate facet degenerative changes without significant spinal canal or neural foraminal stenosis. IMPRESSION: 1. New compression fracture at L1 with retropulsed fragment causing moderate spinal canal stenosis. 2. Multilevel degenerative changes, more pronounced at L4-5 where there is grade 1 anterolisthesis related to prominent facet degeneration, moderate spinal canal stenosis and moderate bilateral neural foraminal narrowing. 3. Unchanged compression fractures at T11 and L4. Aortic Atherosclerosis (ICD10-I70.0). Electronically Signed   By: Pedro Earls M.D.   On: 04/09/2019 16:15   MR LUMBAR SPINE WO CONTRAST  Result Date: 04/09/2019 CLINICAL DATA:  86 year old female with subacute L4 compression fracture diagnosed by MRI last month. New severe L1 compression fracture on CT earlier today. EXAM: MRI LUMBAR SPINE WITHOUT CONTRAST TECHNIQUE: Multiplanar, multisequence MR imaging of the lumbar spine was performed. No intravenous contrast was administered. COMPARISON:  Lumbar spine CT earlier today. Lumbar MRI 03/18/2018. FINDINGS: Segmentation:  Normal, same numbering system as on the comparisons. Alignment: Stable from the CT earlier today. Retropulsion at L1 is new since January, described below. Stable chronic grade 1 anterolisthesis at L4-L5 and L5-S1. Vertebrae: Marrow edema throughout the severely compressed L1 vertebral body, new since 03/18/2018. Retropulsion of the posteroinferior endplate as  seen by CT today resulting in moderate spinal stenosis with up to mild mass effect on the conus (series 9, image 9). No marrow edema in the L1 posterior elements. Chronic T12 compression fracture is stable. The mild L4 compression fracture has not progressed since January marrow edema there has resolved. Also new since January confluent bilateral sacral ala marrow edema (series 11, image 14 on the  left and image 2 on the right. There is an associated nondisplaced appearing fracture through the central S2-S3 level. Associated presacral fluid or edema. Faint posterior endplate marrow edema at T12 inferiorly and L2-L3 appears to be degenerative in nature. Conus medullaris and cauda equina: Conus extends to the L1 level, as stated above. No signal abnormality in the visible lower thoracic spinal cord or conus. Paraspinal and other soft tissues: Mild presacral fluid or edema as stated above. Negative visible abdominal viscera. Disc levels: Widespread lower thoracic and lumbar spine degeneration is stable since last month with the following exceptions: L1-L2: Bulky posterior disc osteophyte complex in large part due to the L1 posteroinferior endplate retropulsion results in new spinal stenosis with up to mild mass effect on the conus. There is associated new moderate to severe bilateral L1 foraminal stenosis. IMPRESSION: 1. Severe L1 compression fracture as seen by CT today is new since 03/18/2018. Significant retropulsion results in new moderate spinal stenosis and moderate to severe bilateral foraminal stenosis. Up to mild mass effect on the conus, but no associated lower cord signal abnormality. 2. Superimposed acute to subacute sacral fractures: Bilateral sacral ala and central S2-S3 segment. These may be insufficiency fractures in the absence of recent fall/trauma. 3. Mild L4 compression fracture remain stable since January. Chronic T12 compression fractures. Electronically Signed   By: Genevie Ann M.D.   On: 04/09/2019  18:28    Review of Systems Blood pressure (!) 160/57, pulse (!) 55, temperature 98 F (36.7 C), temperature source Oral, resp. rate 16, height 5\' 3"  (1.6 m), weight 51.3 kg, SpO2 99 %. Physical Exam She is tender to palpation at the thoracolumbar junction consistent with her L1 fracture. with palpable kyphotic deformity.  She is also tender at her sacrum on either side.  She is able to flex extend her toes and hips with some pain.  Sensation is intact.  Radiology review: Vertebral plana of L1 acute with extensive edema throughout the vertebral body.  Sacral insufficiency fractures S1 and S2 right and left sides.  Assessment/Plan: Acute L1 compression fracture with vertebral plana and bilateral sacral insufficiency fractures.  Recommendation is for kyphoplasty and sacroplasty later today.  Patient was drowsy when I talked with her and will try to get her husband into the office this morning to discuss this with him as well.  Hessie Knows 04/10/2019, 8:19 AM   Patient husband came to the office so I could review x-rays MRI and plan with him.  We will discuss again after surgery.

## 2019-04-11 DIAGNOSIS — M5442 Lumbago with sciatica, left side: Secondary | ICD-10-CM | POA: Diagnosis not present

## 2019-04-11 DIAGNOSIS — F0390 Unspecified dementia without behavioral disturbance: Secondary | ICD-10-CM | POA: Diagnosis not present

## 2019-04-11 DIAGNOSIS — D649 Anemia, unspecified: Secondary | ICD-10-CM | POA: Diagnosis present

## 2019-04-11 DIAGNOSIS — D509 Iron deficiency anemia, unspecified: Secondary | ICD-10-CM | POA: Diagnosis not present

## 2019-04-11 DIAGNOSIS — Z8249 Family history of ischemic heart disease and other diseases of the circulatory system: Secondary | ICD-10-CM | POA: Diagnosis not present

## 2019-04-11 DIAGNOSIS — Z96611 Presence of right artificial shoulder joint: Secondary | ICD-10-CM | POA: Diagnosis present

## 2019-04-11 DIAGNOSIS — R404 Transient alteration of awareness: Secondary | ICD-10-CM | POA: Diagnosis not present

## 2019-04-11 DIAGNOSIS — M48061 Spinal stenosis, lumbar region without neurogenic claudication: Secondary | ICD-10-CM | POA: Diagnosis present

## 2019-04-11 DIAGNOSIS — M6281 Muscle weakness (generalized): Secondary | ICD-10-CM | POA: Diagnosis not present

## 2019-04-11 DIAGNOSIS — G4709 Other insomnia: Secondary | ICD-10-CM | POA: Diagnosis not present

## 2019-04-11 DIAGNOSIS — Z7952 Long term (current) use of systemic steroids: Secondary | ICD-10-CM | POA: Diagnosis not present

## 2019-04-11 DIAGNOSIS — M8448XD Pathological fracture, other site, subsequent encounter for fracture with routine healing: Secondary | ICD-10-CM | POA: Diagnosis not present

## 2019-04-11 DIAGNOSIS — R2689 Other abnormalities of gait and mobility: Secondary | ICD-10-CM | POA: Diagnosis not present

## 2019-04-11 DIAGNOSIS — R296 Repeated falls: Secondary | ICD-10-CM | POA: Diagnosis not present

## 2019-04-11 DIAGNOSIS — Z741 Need for assistance with personal care: Secondary | ICD-10-CM | POA: Diagnosis not present

## 2019-04-11 DIAGNOSIS — Z7401 Bed confinement status: Secondary | ICD-10-CM | POA: Diagnosis not present

## 2019-04-11 DIAGNOSIS — M2550 Pain in unspecified joint: Secondary | ICD-10-CM | POA: Diagnosis not present

## 2019-04-11 DIAGNOSIS — M8448XG Pathological fracture, other site, subsequent encounter for fracture with delayed healing: Secondary | ICD-10-CM | POA: Diagnosis not present

## 2019-04-11 DIAGNOSIS — Z96653 Presence of artificial knee joint, bilateral: Secondary | ICD-10-CM | POA: Diagnosis present

## 2019-04-11 DIAGNOSIS — Z66 Do not resuscitate: Secondary | ICD-10-CM | POA: Diagnosis present

## 2019-04-11 DIAGNOSIS — Z20822 Contact with and (suspected) exposure to covid-19: Secondary | ICD-10-CM | POA: Diagnosis present

## 2019-04-11 DIAGNOSIS — R52 Pain, unspecified: Secondary | ICD-10-CM | POA: Diagnosis not present

## 2019-04-11 DIAGNOSIS — K219 Gastro-esophageal reflux disease without esophagitis: Secondary | ICD-10-CM | POA: Diagnosis present

## 2019-04-11 DIAGNOSIS — Z79899 Other long term (current) drug therapy: Secondary | ICD-10-CM | POA: Diagnosis not present

## 2019-04-11 DIAGNOSIS — R278 Other lack of coordination: Secondary | ICD-10-CM | POA: Diagnosis not present

## 2019-04-11 DIAGNOSIS — M545 Low back pain: Secondary | ICD-10-CM | POA: Diagnosis not present

## 2019-04-11 DIAGNOSIS — I69854 Hemiplegia and hemiparesis following other cerebrovascular disease affecting left non-dominant side: Secondary | ICD-10-CM | POA: Diagnosis not present

## 2019-04-11 DIAGNOSIS — M808AXA Other osteoporosis with current pathological fracture, other site, initial encounter for fracture: Secondary | ICD-10-CM | POA: Diagnosis present

## 2019-04-11 DIAGNOSIS — Z882 Allergy status to sulfonamides status: Secondary | ICD-10-CM | POA: Diagnosis not present

## 2019-04-11 DIAGNOSIS — I351 Nonrheumatic aortic (valve) insufficiency: Secondary | ICD-10-CM | POA: Diagnosis present

## 2019-04-11 DIAGNOSIS — M5489 Other dorsalgia: Secondary | ICD-10-CM | POA: Diagnosis not present

## 2019-04-11 DIAGNOSIS — S32008D Other fracture of unspecified lumbar vertebra, subsequent encounter for fracture with routine healing: Secondary | ICD-10-CM | POA: Diagnosis not present

## 2019-04-11 DIAGNOSIS — S32010D Wedge compression fracture of first lumbar vertebra, subsequent encounter for fracture with routine healing: Secondary | ICD-10-CM | POA: Diagnosis not present

## 2019-04-11 DIAGNOSIS — S32010A Wedge compression fracture of first lumbar vertebra, initial encounter for closed fracture: Secondary | ICD-10-CM | POA: Diagnosis present

## 2019-04-11 DIAGNOSIS — M4857XA Collapsed vertebra, not elsewhere classified, lumbosacral region, initial encounter for fracture: Secondary | ICD-10-CM | POA: Diagnosis not present

## 2019-04-11 DIAGNOSIS — S32000A Wedge compression fracture of unspecified lumbar vertebra, initial encounter for closed fracture: Secondary | ICD-10-CM | POA: Diagnosis present

## 2019-04-11 DIAGNOSIS — F22 Delusional disorders: Secondary | ICD-10-CM | POA: Diagnosis not present

## 2019-04-11 DIAGNOSIS — M8088XD Other osteoporosis with current pathological fracture, vertebra(e), subsequent encounter for fracture with routine healing: Secondary | ICD-10-CM | POA: Diagnosis not present

## 2019-04-11 NOTE — Evaluation (Signed)
Physical Therapy Evaluation Patient Details Name: Melissa [name] MRN: [id number] DOB: 1928-07-14 Today's Date: 04/11/2019   History of Present Illness  Patient is an 86 year old female admitted with intractable low back pain. Found to have severe L1 compression fracture, acute to subacute sacral fractures. S/P kyphoplasty and sacroplasty. PMH includes: GERD.  Clinical Impression  Patient confused upon arrival to room. Agreeable to PT assessment, limited by pain, anxiety. Patient perseverating on calling husband/nurse. Tells me it's January. Patient requires mod assistance with bed mobility due to pain with mobility. Requires mod assist with sit to stand transfer and demonstrates poor initial standing balance.  She attempted forward ambulation with rw and min assist, was able to take a few steps but then wanted to sit back down. She was able to stand again with mod assist and take side steps along bed using RW.  Decreased activity tolerance due to reported pain.  Patient will continue to benefit from skilled PT while here to improve mobility and independence.      Follow Up Recommendations SNF    Equipment Recommendations  Other (comment)(TBD)    Recommendations for Other Services       Precautions / Restrictions Precautions Precautions: Fall Required Braces or Orthoses: Other Brace Other Brace: lumbar corset Restrictions Weight Bearing Restrictions: No Other Position/Activity Restrictions: WBAT      Mobility  Bed Mobility Overal bed mobility: Needs Assistance Bed Mobility: Supine to Sit;Sit to Supine     Supine to sit: Mod assist Sit to supine: Mod assist   General bed mobility comments: assist needed to raise trunk and move LEs in the bed due to pain  Transfers Overall transfer level: Needs assistance Equipment used: Rolling walker (2 wheeled) Transfers: Sit to/from Stand Sit to Stand: Mod assist         General transfer comment: difficulty attaining standing balance  initially. Cues needed for upright posture  Ambulation/Gait Ambulation/Gait assistance: Min assist Gait Distance (Feet): 5 Feet Assistive device: Rolling walker (2 wheeled) Gait Pattern/deviations: Step-to pattern;Decreased stride length;Narrow base of support Gait velocity: decreased   General Gait Details: patient with difficulty ambulating due to pain. Took a few steps and needed to return to sitting. Assist needed to move RW. Then patient able to stand again and take side steps along bed.  Stairs            Wheelchair Mobility    Modified Rankin (Stroke Patients Only)       Balance Overall balance assessment: Needs assistance Sitting-balance support: Feet supported;Single extremity supported Sitting balance-Leahy Scale: Fair     Standing balance support: Bilateral upper extremity supported;During functional activity Standing balance-Leahy Scale: Poor Standing balance comment: requires use of RW and external support                             Pertinent Vitals/Pain Pain Assessment: Faces Faces Pain Scale: Hurts whole lot Pain Location: left hip, back Pain Descriptors / Indicators: Aching;Discomfort;Grimacing Pain Intervention(s): Limited activity within patient's tolerance;Monitored during session    Home Living Family/patient expects to be discharged to:: Private residence Living Arrangements: Spouse/significant other Available Help at Discharge: Family;Available 24 hours/day Type of Home: Independent living facility Home Access: Level entry     Home Layout: One level Home Equipment: Walker - 4 wheels Additional Comments: apparently she lives at [facility] with [facility]    Prior Function Level of Independence: Independent  Hand Dominance   Dominant Hand: Right    Extremity/Trunk Assessment   Upper Extremity Assessment Upper Extremity Assessment: Overall WFL for tasks assessed    Lower Extremity Assessment Lower  Extremity Assessment: Generalized weakness    Cervical / Trunk Assessment Cervical / Trunk Assessment: Normal  Communication   Communication: No difficulties  Cognition Arousal/Alertness: Awake/alert Behavior During Therapy: Restless Overall Cognitive Status: Impaired/Different from baseline Area of Impairment: Attention;Safety/judgement;Awareness;Problem solving                   Current Attention Level: Sustained     Safety/Judgement: Decreased awareness of safety;Decreased awareness of deficits Awareness: Emergent Problem Solving: Requires verbal cues;Requires tactile cues General Comments: patient is confused, perseverating on calling her husband or the nurse.      General Comments      Exercises     Assessment/Plan    PT Assessment Patient needs continued PT services  PT Problem List Decreased strength;Decreased mobility;Decreased activity tolerance;Decreased balance;Decreased cognition;Pain;Decreased knowledge of use of DME;Decreased safety awareness       PT Treatment Interventions DME instruction;Therapeutic exercise;Gait training;Balance training;Functional mobility training;Therapeutic activities;Patient/family education    PT Goals (Current goals can be found in the Care Plan section)  Acute Rehab PT Goals Patient Stated Goal: patient would like to go home PT Goal Formulation: With patient Time For Goal Achievement: 04/18/19 Potential to Achieve Goals: Fair    Frequency BID   Barriers to discharge Decreased caregiver support      Co-evaluation               AM-PAC PT "6 Clicks" Mobility  Outcome Measure Help needed turning from your back to your side while in a flat bed without using bedrails?: A Lot Help needed moving from lying on your back to sitting on the side of a flat bed without using bedrails?: A Lot Help needed moving to and from a bed to a chair (including a wheelchair)?: A Lot Help needed standing up from a chair using your  arms (e.g., wheelchair or bedside chair)?: A Lot Help needed to walk in hospital room?: A Lot Help needed climbing 3-5 steps with a railing? : Total 6 Click Score: 11    End of Session Equipment Utilized During Treatment: Gait belt;Back brace Activity Tolerance: Patient limited by pain Patient left: in bed;with call bell/phone within reach;with bed alarm set Nurse Communication: Mobility status PT Visit Diagnosis: Unsteadiness on feet (R26.81);Other abnormalities of gait and mobility (R26.89);Muscle weakness (generalized) (M62.81);Difficulty in walking, not elsewhere classified (R26.2);Pain Pain - Right/Left: Left Pain - part of body: Hip(back)    Time: [id number] PT Time Calculation (min) (ACUTE ONLY): 31 min   Charges:   PT Evaluation $PT Eval Moderate Complexity: 1 Mod PT Treatments $Therapeutic Activity: 8-22 mins        Cristyn Crossno, PT, GCS 04/11/19,11:56 AM

## 2019-04-11 NOTE — Progress Notes (Signed)
PROGRESS NOTE    Melissa [name]  [id number] DOB: 1928-04-08 DOA: 04/09/2019 PCP: [name], MD   Brief Narrative:  Melissa Durocher Mooreis a 86 y.o.femalewith medical history significant forGERD, who presents to the emergency room with low back pain radiating to the right leg, not improving with outpatient prednisone treatment possible sciatica. Patient states that she has had back pain for several years but over the past 1 month the pain has been more debilitating, preventing her from tending to her usual chores. At baseline she is independent in her ADLs but of late has needed a Rollator to walk and increasing assistance from her husband. Pain is described as severeand sharp in natureand worse with changes of position.Radiates down the left leg. She denies saddle anesthesia, bowel or bladder retention.She denies fever or chills. Had no recent falls or injury ED Course:On arrival in the emergency room, vitals were mostly within normal limits. She had a CT of her lumbar spine which was followed up with a noncontrasted MRI that showed severe L1 compression fracture with significant retropulsion and new moderate spinal stenosis with moderate to severe bilateral foraminal stenosis and mild mass-effect on the conus but no associated lower cord signal abnormality. It also showed superimposed acute to subacute sacral fractures which may be insufficiency fractures in the absence of recent fall or trauma. Urgency room provider contacted neurosurgeon, Dr. [name] who recommended lumbar spinal orthotic for pain, admission for pain control and recommended keeping n.p.o. overnight for possible sacral plasty by Dr. [name] in the a.m. Blood work as well as urinalysis pending at the time of decision to admit.   Assessment & Plan:   Principal Problem:   Intractable low back pain Active Problems:   Compression fracture of lumbosacral vertebra, nontraumatic, initial encounter with retropulsion  Sacral insufficiency fracture   Intractable low back pain due to L1 Acute compression fracture, nontraumatic,with retropulsion andSacral insufficiency fracture - pain control -s/p L1 kyphoplasty done by orthopedics Dr. [name] on 04/10/2019  -Oxycodone for pain with IV Toradol as well as IV morphine for breakthrough, still having difficulty controlling pain -Order placed to arrange placement of LSO see note of 2/19 by Mr. Broadus [name] -Follow orthopedics for further recommendation Follow PT/OT eval for disposition-still pending  Anemia --Baseline since 2015 --continue to monitor  GERD -Continue home omeprazole   Body mass index is 20.02 kg/m.  Interventions:  DVT prophylaxis: SCD/Compression stockings  Code Status: DNR    Code Status Orders  (From admission, onward)         Start     Ordered   04/10/19 1620  Do not attempt resuscitation (DNR)  Continuous    Question Answer Comment  In the event of cardiac or respiratory ARREST Do not call a "code blue"   In the event of cardiac or respiratory ARREST Do not perform Intubation, CPR, defibrillation or ACLS   In the event of cardiac or respiratory ARREST Use medication by any route, position, wound care, and other measures to relive pain and suffering. May use oxygen, suction and manual treatment of airway obstruction as needed for comfort.      04/10/19 1620        Code Status History    Date Active Date Inactive Code Status Order ID Comments User Context   04/09/2019 2055 04/10/2019 1620 Full Code [id number]  [name], MD ED   11/26/2013 1526 11/27/2013 1503 Full Code [id number]  [name] Inpatient   Advance Care Planning Activity  Advance Directive Documentation     Most Recent Value  Type of Advance Directive  Healthcare Power of Attorney, Living will  Pre-existing out of facility DNR order (yellow form or pink MOST form)  --  "MOST" Form in Place?  --     Family Communication: None  today Disposition Plan:   Patient will be changed to inpatient, she is requiring IV pain medication to control pain and is status post kyphoplasty Consults called: None Admission status: Inpatient   Consultants:   Orthopedics  Procedures:  DG Lumbar Spine 2-3 Views  Result Date: 04/10/2019 CLINICAL DATA:  L1 kyphoplasty and sacroplasty. EXAM: LUMBAR SPINE - 2-3 VIEW; DG C-ARM 1-60 MIN COMPARISON:  None. FINDINGS: Examination demonstrates expected changes after patient's bilateral sacroplasty and L1 kyphoplasty. Moderate degenerative change of the lumbosacral spine. IMPRESSION: Expected changes post L1 kyphoplasty and bilateral sacroplasty. Electronically Signed   By: [name] M.D.   On: 04/10/2019 14:43   CT Lumbar Spine Wo Contrast  Result Date: 04/09/2019 CLINICAL DATA:  Lumbar radiculopathy EXAM: CT LUMBAR SPINE WITHOUT CONTRAST TECHNIQUE: Multidetector CT imaging of the lumbar spine was performed without intravenous contrast administration. Multiplanar CT image reconstructions were also generated. COMPARISON:  Plain films 05/22/2018. FINDINGS: Segmentation: 5 lumbar type vertebrae. Alignment: Minimal retrolisthesis of T11 over T12. Grade I anterolisthesis of L4 over L5 related to degenerative facet disease. Vertebrae: Compression fracture noted at L1 with flattening of the anterior two thirds of the vertebral body and retropulsion of the posterior wall into the spinal canal resulting in moderate spinal canal stenosis. This is new from prior plain films. Compression fractures of the T11 and L4 vertebral bodies are again seen and appear unchanged. Diffuse decrease of the bone density. Paraspinal and other soft tissues: Calcified plaques noted in the abdominal aorta. Disc levels: T10-T11: Mild retropulsion of the superior endplate resulting in mild spinal canal stenosis. T11-T12: Posterior disc protrusion with endplate spurring and facet degenerative changes causing mild spinal canal narrowing  and mild bilateral neural foraminal stenosis. T12-L1: Calcified posterior disc protrusion and facet degenerative changes causing mild spinal canal stenosis. No significant neural foraminal narrowing. L1-L2: Compression fracture as described above with retropulsed fragment causing moderate spinal canal stenosis and extending into the bilateral neural foramen resulting in moderate bilateral stenosis. L2-3: Posterior disc protrusion and facet degenerative changes resulting in mild spinal canal stenosis and mild bilateral neural foraminal narrowing. L3-4: Disc bulge, facet degenerative changes and ligamentum flavum redundancy resulting mild spinal canal stenosis and moderate bilateral neural foraminal narrowing. L4-5: Disc bulge, prominent facet degenerative changes and ligamentum flavum redundancy resulting in moderate spinal canal stenosis and moderate bilateral neural foraminal narrowing. L5-S1: Disc bulge and moderate facet degenerative changes without significant spinal canal or neural foraminal stenosis. IMPRESSION: 1. New compression fracture at L1 with retropulsed fragment causing moderate spinal canal stenosis. 2. Multilevel degenerative changes, more pronounced at L4-5 where there is grade 1 anterolisthesis related to prominent facet degeneration, moderate spinal canal stenosis and moderate bilateral neural foraminal narrowing. 3. Unchanged compression fractures at T11 and L4. Aortic Atherosclerosis (ICD10-I70.0). Electronically Signed   By: Pedro Earls M.D.   On: 04/09/2019 16:15   MR LUMBAR SPINE WO CONTRAST  Result Date: 04/09/2019 CLINICAL DATA:  86 year old female with subacute L4 compression fracture diagnosed by MRI last month. New severe L1 compression fracture on CT earlier today. EXAM: MRI LUMBAR SPINE WITHOUT CONTRAST TECHNIQUE: Multiplanar, multisequence MR imaging of the lumbar spine was performed. No intravenous contrast was administered.  COMPARISON:  Lumbar spine CT earlier  today. Lumbar MRI 03/18/2018. FINDINGS: Segmentation:  Normal, same numbering system as on the comparisons. Alignment: Stable from the CT earlier today. Retropulsion at L1 is new since January, described below. Stable chronic grade 1 anterolisthesis at L4-L5 and L5-S1. Vertebrae: Marrow edema throughout the severely compressed L1 vertebral body, new since 03/18/2018. Retropulsion of the posteroinferior endplate as seen by CT today resulting in moderate spinal stenosis with up to mild mass effect on the conus (series 9, image 9). No marrow edema in the L1 posterior elements. Chronic T12 compression fracture is stable. The mild L4 compression fracture has not progressed since January marrow edema there has resolved. Also new since January confluent bilateral sacral ala marrow edema (series 11, image 14 on the left and image 2 on the right. There is an associated nondisplaced appearing fracture through the central S2-S3 level. Associated presacral fluid or edema. Faint posterior endplate marrow edema at T12 inferiorly and L2-L3 appears to be degenerative in nature. Conus medullaris and cauda equina: Conus extends to the L1 level, as stated above. No signal abnormality in the visible lower thoracic spinal cord or conus. Paraspinal and other soft tissues: Mild presacral fluid or edema as stated above. Negative visible abdominal viscera. Disc levels: Widespread lower thoracic and lumbar spine degeneration is stable since last month with the following exceptions: L1-L2: Bulky posterior disc osteophyte complex in large part due to the L1 posteroinferior endplate retropulsion results in new spinal stenosis with up to mild mass effect on the conus. There is associated new moderate to severe bilateral L1 foraminal stenosis. IMPRESSION: 1. Severe L1 compression fracture as seen by CT today is new since 03/18/2018. Significant retropulsion results in new moderate spinal stenosis and moderate to severe bilateral foraminal  stenosis. Up to mild mass effect on the conus, but no associated lower cord signal abnormality. 2. Superimposed acute to subacute sacral fractures: Bilateral sacral ala and central S2-S3 segment. These may be insufficiency fractures in the absence of recent fall/trauma. 3. Mild L4 compression fracture remain stable since January. Chronic T12 compression fractures. Electronically Signed   By: Genevie Ann M.D.   On: 04/09/2019 18:28   DG C-Arm 1-60 Min  Result Date: 04/10/2019 CLINICAL DATA:  L1 kyphoplasty and sacroplasty. EXAM: LUMBAR SPINE - 2-3 VIEW; DG C-ARM 1-60 MIN COMPARISON:  None. FINDINGS: Examination demonstrates expected changes after patient's bilateral sacroplasty and L1 kyphoplasty. Moderate degenerative change of the lumbosacral spine. IMPRESSION: Expected changes post L1 kyphoplasty and bilateral sacroplasty. Electronically Signed   By: [name] M.D.   On: 04/10/2019 14:43     Antimicrobials:   none    Subjective: Patient is not been out of bed, still reports pain  Objective: Vitals:   04/10/19 2100 04/11/19 0027 04/11/19 0535 04/11/19 0747  BP: (!) 154/62 (!) 148/66 (!) 154/63 (!) 142/57  Pulse: 70 63 72 77  Resp: 17  18 18   Temp: 98 F (36.7 C)   97.9 F (36.6 C)  TempSrc: Oral   Oral  SpO2: 92%  93% 94%  Weight:      Height:        Intake/Output Summary (Last 24 hours) at 04/11/2019 1039 Last data filed at 04/11/2019 0955 Gross per 24 hour  Intake 1290 ml  Output 76 ml  Net 1214 ml   Filed Weights   04/09/19 1440  Weight: 51.3 kg    Examination:  General exam: Appears calm and comfortable  Respiratory system: Clear to auscultation.  Respiratory effort normal. Cardiovascular system: S1 & S2 heard, RRR. No JVD, murmurs, rubs, gallops or clicks. No pedal edema. Gastrointestinal system: Abdomen is nondistended, soft and nontender. No organomegaly or masses felt. Normal bowel sounds heard. Central nervous system: Alert and oriented. No focal neurological  deficits.-See below for limitation secondary to pain Extremities: Limited range of motion secondary to pain, neurovascularly intact Skin: No rashes, lesions or ulcers Psychiatry: Judgement and insight appear normal. Mood & affect appropriate.     Data Reviewed: I have personally reviewed following labs and imaging studies  CBC: Recent Labs  Lab 04/09/19 2043 04/10/19 1100  WBC 7.3 7.0  NEUTROABS 4.8  --   HGB 10.0* 8.8*  HCT 30.5* 26.9*  MCV 97.4 99.3  PLT 464* [id number]*   Basic Metabolic Panel: Recent Labs  Lab 04/09/19 2043 04/10/19 1100  NA 137 138  K 3.7 3.7  CL 101 103  CO2 25 28  GLUCOSE 127* 91  BUN 21 20  CREATININE 0.51 0.59  CALCIUM 8.7* 8.1*  MG  --  2.2  PHOS  --  3.5   GFR: Estimated Creatinine Clearance: 39.4 mL/min (by C-G formula based on SCr of 0.59 mg/dL). Liver Function Tests: Recent Labs  Lab 04/09/19 2043  AST 26  ALT 30  ALKPHOS 185*  BILITOT 0.7  PROT 7.4  ALBUMIN 3.8   No results for input(s): LIPASE, AMYLASE in the last 168 hours. No results for input(s): AMMONIA in the last 168 hours. Coagulation Profile: No results for input(s): INR, PROTIME in the last 168 hours. Cardiac Enzymes: No results for input(s): CKTOTAL, CKMB, CKMBINDEX, TROPONINI in the last 168 hours. BNP (last 3 results) No results for input(s): PROBNP in the last 8760 hours. HbA1C: No results for input(s): HGBA1C in the last 72 hours. CBG: No results for input(s): GLUCAP in the last 168 hours. Lipid Profile: No results for input(s): CHOL, HDL, LDLCALC, TRIG, CHOLHDL, LDLDIRECT in the last 72 hours. Thyroid Function Tests: No results for input(s): TSH, T4TOTAL, FREET4, T3FREE, THYROIDAB in the last 72 hours. Anemia Panel: No results for input(s): VITAMINB12, FOLATE, FERRITIN, TIBC, IRON, RETICCTPCT in the last 72 hours. Sepsis Labs: No results for input(s): PROCALCITON, LATICACIDVEN in the last 168 hours.  Recent Results (from the past 240 hour(s))  SARS  CORONAVIRUS 2 (TAT 6-24 HRS) Nasopharyngeal Nasopharyngeal Swab     Status: None   Collection Time: 04/09/19  8:43 PM   Specimen: Nasopharyngeal Swab  Result Value Ref Range Status   SARS Coronavirus 2 NEGATIVE NEGATIVE Final    Comment: (NOTE) SARS-CoV-2 target nucleic acids are NOT DETECTED. The SARS-CoV-2 RNA is generally detectable in upper and lower respiratory specimens during the acute phase of infection. Negative results do not preclude SARS-CoV-2 infection, do not rule out co-infections with other pathogens, and should not be used as the sole basis for treatment or other patient management decisions. Negative results must be combined with clinical observations, patient history, and epidemiological information. The expected result is Negative. Fact Sheet for Patients: SugarRoll.be Fact Sheet for Healthcare Providers: https://www.woods-mathews.com/ This test is not yet approved or cleared by the [place] FDA and  has been authorized for detection and/or diagnosis of SARS-CoV-2 by FDA under an Emergency Use Authorization (EUA). This EUA will remain  in effect (meaning this test can be used) for the duration of the COVID-19 declaration under Section 56 4(b)(1) of the Act, 21 U.S.C. section 360bbb-3(b)(1), unless the authorization is terminated or revoked sooner. Performed at [facility], [place] [address], [place], [place] [address]          Radiology Studies: DG Lumbar Spine 2-3 Views  Result Date: 04/10/2019 CLINICAL DATA:  L1 kyphoplasty and sacroplasty. EXAM: LUMBAR SPINE - 2-3 VIEW; DG C-ARM 1-60 MIN COMPARISON:  None. FINDINGS: Examination demonstrates expected changes after patient's bilateral sacroplasty and L1 kyphoplasty. Moderate degenerative change of the lumbosacral spine. IMPRESSION: Expected changes post L1 kyphoplasty and bilateral sacroplasty. Electronically Signed   By: [name] M.D.   On: 04/10/2019  14:43   CT Lumbar Spine Wo Contrast  Result Date: 04/09/2019 CLINICAL DATA:  Lumbar radiculopathy EXAM: CT LUMBAR SPINE WITHOUT CONTRAST TECHNIQUE: Multidetector CT imaging of the lumbar spine was performed without intravenous contrast administration. Multiplanar CT image reconstructions were also generated. COMPARISON:  Plain films 05/22/2018. FINDINGS: Segmentation: 5 lumbar type vertebrae. Alignment: Minimal retrolisthesis of T11 over T12. Grade I anterolisthesis of L4 over L5 related to degenerative facet disease. Vertebrae: Compression fracture noted at L1 with flattening of the anterior two thirds of the vertebral body and retropulsion of the posterior wall into the spinal canal resulting in moderate spinal canal stenosis. This is new from prior plain films. Compression fractures of the T11 and L4 vertebral bodies are again seen and appear unchanged. Diffuse decrease of the bone density. Paraspinal and other soft tissues: Calcified plaques noted in the abdominal aorta. Disc levels: T10-T11: Mild retropulsion of the superior endplate resulting in mild spinal canal stenosis. T11-T12: Posterior disc protrusion with endplate spurring and facet degenerative changes causing mild spinal canal narrowing and mild bilateral neural foraminal stenosis. T12-L1: Calcified posterior disc protrusion and facet degenerative changes causing mild spinal canal stenosis. No significant neural foraminal narrowing. L1-L2: Compression fracture as described above with retropulsed fragment causing moderate spinal canal stenosis and extending into the bilateral neural foramen resulting in moderate bilateral stenosis. L2-3: Posterior disc protrusion and facet degenerative changes resulting in mild spinal canal stenosis and mild bilateral neural foraminal narrowing. L3-4: Disc bulge, facet degenerative changes and ligamentum flavum redundancy resulting mild spinal canal stenosis and moderate bilateral neural foraminal narrowing. L4-5:  Disc bulge, prominent facet degenerative changes and ligamentum flavum redundancy resulting in moderate spinal canal stenosis and moderate bilateral neural foraminal narrowing. L5-S1: Disc bulge and moderate facet degenerative changes without significant spinal canal or neural foraminal stenosis. IMPRESSION: 1. New compression fracture at L1 with retropulsed fragment causing moderate spinal canal stenosis. 2. Multilevel degenerative changes, more pronounced at L4-5 where there is grade 1 anterolisthesis related to prominent facet degeneration, moderate spinal canal stenosis and moderate bilateral neural foraminal narrowing. 3. Unchanged compression fractures at T11 and L4. Aortic Atherosclerosis (ICD10-I70.0). Electronically Signed   By: Pedro Earls M.D.   On: 04/09/2019 16:15   MR LUMBAR SPINE WO CONTRAST  Result Date: 04/09/2019 CLINICAL DATA:  86 year old female with subacute L4 compression fracture diagnosed by MRI last month. New severe L1 compression fracture on CT earlier today. EXAM: MRI LUMBAR SPINE WITHOUT CONTRAST TECHNIQUE: Multiplanar, multisequence MR imaging of the lumbar spine was performed. No intravenous contrast was administered. COMPARISON:  Lumbar spine CT earlier today. Lumbar MRI 03/18/2018. FINDINGS: Segmentation:  Normal, same numbering system as on the comparisons. Alignment: Stable from the CT earlier today. Retropulsion at L1 is new since January, described below. Stable chronic grade 1 anterolisthesis at L4-L5 and L5-S1. Vertebrae: Marrow edema throughout the severely compressed L1 vertebral body, new since 03/18/2018. Retropulsion of the posteroinferior endplate as seen by CT today resulting in  moderate spinal stenosis with up to mild mass effect on the conus (series 9, image 9). No marrow edema in the L1 posterior elements. Chronic T12 compression fracture is stable. The mild L4 compression fracture has not progressed since January marrow edema there has resolved.  Also new since January confluent bilateral sacral ala marrow edema (series 11, image 14 on the left and image 2 on the right. There is an associated nondisplaced appearing fracture through the central S2-S3 level. Associated presacral fluid or edema. Faint posterior endplate marrow edema at T12 inferiorly and L2-L3 appears to be degenerative in nature. Conus medullaris and cauda equina: Conus extends to the L1 level, as stated above. No signal abnormality in the visible lower thoracic spinal cord or conus. Paraspinal and other soft tissues: Mild presacral fluid or edema as stated above. Negative visible abdominal viscera. Disc levels: Widespread lower thoracic and lumbar spine degeneration is stable since last month with the following exceptions: L1-L2: Bulky posterior disc osteophyte complex in large part due to the L1 posteroinferior endplate retropulsion results in new spinal stenosis with up to mild mass effect on the conus. There is associated new moderate to severe bilateral L1 foraminal stenosis. IMPRESSION: 1. Severe L1 compression fracture as seen by CT today is new since 03/18/2018. Significant retropulsion results in new moderate spinal stenosis and moderate to severe bilateral foraminal stenosis. Up to mild mass effect on the conus, but no associated lower cord signal abnormality. 2. Superimposed acute to subacute sacral fractures: Bilateral sacral ala and central S2-S3 segment. These may be insufficiency fractures in the absence of recent fall/trauma. 3. Mild L4 compression fracture remain stable since January. Chronic T12 compression fractures. Electronically Signed   By: Genevie Ann M.D.   On: 04/09/2019 18:28   DG C-Arm 1-60 Min  Result Date: 04/10/2019 CLINICAL DATA:  L1 kyphoplasty and sacroplasty. EXAM: LUMBAR SPINE - 2-3 VIEW; DG C-ARM 1-60 MIN COMPARISON:  None. FINDINGS: Examination demonstrates expected changes after patient's bilateral sacroplasty and L1 kyphoplasty. Moderate degenerative  change of the lumbosacral spine. IMPRESSION: Expected changes post L1 kyphoplasty and bilateral sacroplasty. Electronically Signed   By: [name] M.D.   On: 04/10/2019 14:43        Scheduled Meds: . cholecalciferol  1,000 Units Oral Daily  . docusate sodium  100 mg Oral BID  . enoxaparin (LOVENOX) injection  40 mg Subcutaneous Q24H  . multivitamin with minerals  1 tablet Oral Daily  . pantoprazole  40 mg Oral Daily  . predniSONE  20 mg Oral Q0600   Continuous Infusions: . sodium chloride 50 mL/hr at 04/11/19 0533     LOS: 0 days    Time spent: [place], MD [facility]  If 7PM-7AM, please contact night-coverage  04/11/2019, 10:39 AM

## 2019-04-11 NOTE — Progress Notes (Signed)
Physical Therapy Treatment Patient Details Name: DEUNDRA HULLUM MRN: [id number] DOB: January 03, 1929 Today's Date: 04/11/2019    History of Present Illness Patient is an 86 year old female admitted with intractable low back pain. Found to have severe L1 compression fracture, acute to subacute sacral fractures. S/P kyphoplasty and sacroplasty. PMH includes: GERD.    PT Comments    Patient received in bed, wants to try moving again. Disoriented, anxious. States she hasn't been up in a week, even though I worked with her this morning. Patient requires mod assist with bed mobility due to pain with movement. Rolled to her left side and pushed up to sitting. Total assist needed to don back brace, min assist with sit to stand. Cues for upright posture throughout. Patient anxious to return to sitting once up. Able to transfer to recliner with min assist. Patient will continue to benefit from skilled PT while here to improve independence with mobility.    Follow Up Recommendations  SNF     Equipment Recommendations  Other (comment)(TBD)    Recommendations for Other Services       Precautions / Restrictions Precautions Precautions: Fall Required Braces or Orthoses: Spinal Brace Spinal Brace: Lumbar corset Other Brace: lumbar corset Restrictions Weight Bearing Restrictions: No Other Position/Activity Restrictions: WBAT    Mobility  Bed Mobility Overal bed mobility: Needs Assistance Bed Mobility: Supine to Sit;Rolling;Sidelying to Sit Rolling: Mod assist Sidelying to sit: Mod assist Supine to sit: Mod assist Sit to supine: Mod assist   General bed mobility comments: pain limited, requires assist with all mobility  Transfers Overall transfer level: Needs assistance Equipment used: Rolling walker (2 wheeled) Transfers: Sit to/from Stand Sit to Stand: From elevated surface;Min assist         General transfer comment: cues needed for upright posture. Anxious with mobility. Requires  assistance to don lumbar corset prior to moving.  Ambulation/Gait Ambulation/Gait assistance: Min assist Gait Distance (Feet): 5 Feet Assistive device: Rolling walker (2 wheeled) Gait Pattern/deviations: Step-to pattern;Decreased stride length;Trunk flexed;Decreased step length - right;Decreased step length - left;Narrow base of support Gait velocity: decreased   General Gait Details: pain limited. Able to take two steps forward, but then wanted to sit back down. Requires assist to manage rolling walker, max cues to continue turning and not to sit until backed up to recliner.   Stairs             Wheelchair Mobility    Modified Rankin (Stroke Patients Only)       Balance Overall balance assessment: Needs assistance Sitting-balance support: Feet supported Sitting balance-Leahy Scale: Fair Sitting balance - Comments: needs supervision for safety   Standing balance support: Bilateral upper extremity supported;During functional activity Standing balance-Leahy Scale: Poor Standing balance comment: requires use of RW and external support                            Cognition Arousal/Alertness: Awake/alert Behavior During Therapy: Restless;Anxious Overall Cognitive Status: No family/caregiver present to determine baseline cognitive functioning Area of Impairment: Attention;Safety/judgement;Awareness;Problem solving                   Current Attention Level: Sustained     Safety/Judgement: Decreased awareness of safety;Decreased awareness of deficits Awareness: Emergent Problem Solving: Requires verbal cues;Requires tactile cues General Comments: patient is anxious, wants someone to stay with her, anxious about wanting to go home tomorrow.      Exercises Other Exercises Other Exercises:  seated BLE exercises: ap, hip abd/add ( with assist on left due to pain), laq, x10 reps each    General Comments        Pertinent Vitals/Pain Pain Assessment:  Faces Faces Pain Scale: Hurts even more Pain Location: left hip, back Pain Descriptors / Indicators: Aching;Discomfort;Grimacing Pain Intervention(s): Limited activity within patient's tolerance;Monitored during session;Repositioned    Home Living Family/patient expects to be discharged to:: Private residence Living Arrangements: Spouse/significant other Available Help at Discharge: Family;Available 24 hours/day Type of Home: Independent living facility Home Access: Level entry   Home Layout: One level Home Equipment: Walker - 4 wheels Additional Comments: apparently she lives at [facility] with husband    Prior Function Level of Independence: Independent          PT Goals (current goals can now be found in the care plan section) Acute Rehab PT Goals Patient Stated Goal: patient would like to go home PT Goal Formulation: With patient Time For Goal Achievement: 04/18/19 Potential to Achieve Goals: Fair Progress towards PT goals: Progressing toward goals    Frequency    BID      PT Plan Current plan remains appropriate    Co-evaluation              AM-PAC PT "6 Clicks" Mobility   Outcome Measure  Help needed turning from your back to your side while in a flat bed without using bedrails?: A Lot Help needed moving from lying on your back to sitting on the side of a flat bed without using bedrails?: A Lot Help needed moving to and from a bed to a chair (including a wheelchair)?: A Lot Help needed standing up from a chair using your arms (e.g., wheelchair or bedside chair)?: A Little Help needed to walk in hospital room?: A Lot Help needed climbing 3-5 steps with a railing? : Total 6 Click Score: 12    End of Session Equipment Utilized During Treatment: Gait belt;Back brace Activity Tolerance: Patient limited by pain Patient left: in chair;with call bell/phone within reach;with chair alarm set Nurse Communication: Mobility status PT Visit Diagnosis:  Unsteadiness on feet (R26.81);Other abnormalities of gait and mobility (R26.89);Difficulty in walking, not elsewhere classified (R26.2);Muscle weakness (generalized) (M62.81);Pain Pain - Right/Left: Left Pain - part of body: Hip     Time: 1245-1301 PT Time Calculation (min) (ACUTE ONLY): 16 min  Charges:  $Therapeutic Exercise: 8-22 mins $Therapeutic Activity: 8-22 mins                     Pardeep Pautz, PT, GCS 04/11/19,1:53 PM

## 2019-04-12 NOTE — Progress Notes (Signed)
Physical Therapy Treatment Patient Details Name: Melissa [name] MRN: [id number] DOB: Aug 27, 1928 Today's Date: 04/12/2019    History of Present Illness Patient is an 86 year old female admitted with intractable low back pain. Found to have severe L1 compression fracture, acute to subacute sacral fractures. S/P kyphoplasty and sacroplasty. PMH includes: GERD.    PT Comments    Pt in bed,  Generally confused to why she is in the hospital and not home.  Participated in exercises as described below.  To EOB with good effort by pt but still needs mod a x 1.  Once seated sitting balance with min guard.  Does not attempt to assist with donning brace.  Stands to walker x 2 with min assist but sits quickly back on the bed and is unable to take any steps towards chair today despite encouragement.  Walker removed and she stands pivots to recliner with min a x 1.  Remained up with chair alarm on.  Assisted pt with calling her husband who is able to calm and reassure her.   Follow Up Recommendations  SNF     Equipment Recommendations       Recommendations for Other Services       Precautions / Restrictions Precautions Precautions: Fall Required Braces or Orthoses: Spinal Brace Spinal Brace: Lumbar corset Restrictions Weight Bearing Restrictions: No Other Position/Activity Restrictions: WBAT    Mobility  Bed Mobility Overal bed mobility: Needs Assistance Bed Mobility: Rolling;Sidelying to Sit Rolling: Mod assist Sidelying to sit: Mod assist          Transfers Overall transfer level: Needs assistance Equipment used: Rolling walker (2 wheeled);None Transfers: Sit to/from Stand Sit to Stand: From elevated surface;Min assist         General transfer comment: generally unsteady and sits quickly despite cues to remain standing  Ambulation/Gait         Gait velocity: decreased   General Gait Details: uanble to step with walker today   Stairs             Wheelchair  Mobility    Modified Rankin (Stroke Patients Only)       Balance Overall balance assessment: Needs assistance Sitting-balance support: Feet supported Sitting balance-Leahy Scale: Fair     Standing balance support: Bilateral upper extremity supported Standing balance-Leahy Scale: Poor Standing balance comment: requires use of RW and external support                            Cognition Arousal/Alertness: Awake/alert Behavior During Therapy: Restless;Anxious Overall Cognitive Status: No family/caregiver present to determine baseline cognitive functioning Area of Impairment: Attention;Safety/judgement;Awareness;Problem solving                   Current Attention Level: Sustained     Safety/Judgement: Decreased awareness of safety;Decreased awareness of deficits Awareness: Emergent Problem Solving: Requires verbal cues;Requires tactile cues General Comments: patient is anxious, wants to call husband - assisted with call, anxious about why she is here and not home but is aware she is in the hospital.      Exercises Other Exercises Other Exercises: supine ankle pumps, heel slides, ab/add and SLR    General Comments        Pertinent Vitals/Pain Pain Assessment: Faces Faces Pain Scale: Hurts even more Pain Location: left hip, back Pain Descriptors / Indicators: Aching;Discomfort;Grimacing Pain Intervention(s): Limited activity within patient's tolerance;Monitored during session;Repositioned    Home Living  Prior Function            PT Goals (current goals can now be found in the care plan section) Progress towards PT goals: Progressing toward goals    Frequency    BID      PT Plan Current plan remains appropriate    Co-evaluation              AM-PAC PT "6 Clicks" Mobility   Outcome Measure  Help needed turning from your back to your side while in a flat bed without using bedrails?: A Lot Help needed  moving from lying on your back to sitting on the side of a flat bed without using bedrails?: A Lot Help needed moving to and from a bed to a chair (including a wheelchair)?: A Lot Help needed standing up from a chair using your arms (e.g., wheelchair or bedside chair)?: A Lot Help needed to walk in hospital room?: Total Help needed climbing 3-5 steps with a railing? : Total 6 Click Score: 10    End of Session Equipment Utilized During Treatment: Gait belt;Back brace Activity Tolerance: Patient limited by fatigue;Patient limited by pain Patient left: in chair;with call bell/phone within reach;with chair alarm set Nurse Communication: Mobility status Pain - Right/Left: Left Pain - part of body: Hip     Time: [id number] PT Time Calculation (min) (ACUTE ONLY): 23 min  Charges:  $Therapeutic Exercise: 8-22 mins $Therapeutic Activity: 8-22 mins                     Chesley Noon, PTA 04/12/19, 11:29 AM

## 2019-04-12 NOTE — NC FL2 (Signed)
[place] LEVEL OF CARE SCREENING TOOL     IDENTIFICATION  Patient Name: Melissa [name] Birthdate: June 16, 1928 Sex: female Admission Date (Current Location): 04/09/2019  [place] and [place] Number:  Engineering geologist and Address:  [facility], [address] [place], [place] [address]      Provider Number: [id number]  Attending Physician Name and Address:  Marcell Anger*  Relative Name and Phone Number:  Jenny Reichmann [phone]    Current Level of Care: Hospital Recommended Level of Care: [place] Prior Approval Number:    Date Approved/Denied: 07/08/18 PASRR Number: [id number]  Discharge Plan: SNF    Current Diagnoses: Patient Active Problem List   Diagnosis Date Noted  . Compression fx, lumbar spine ([place]) 04/11/2019  . Intractable low back pain 04/09/2019  . Compression fracture of lumbosacral vertebra, nontraumatic, initial encounter with retropulsion 04/09/2019  . Sacral insufficiency fracture 04/09/2019  . AI (aortic incompetence) 12/30/2017  . OP (osteoporosis) 12/30/2017  . S/P shoulder replacement 11/26/2013  . ANEMIA, IRON DEFICIENCY 04/05/2009  . HEMORRHOIDS, INTERNAL 04/05/2009  . GERD 04/05/2009  . CONSTIPATION 04/05/2009  . ARTHRITIS 04/05/2009    Orientation RESPIRATION BLADDER Height & Weight     Self, Time, Situation, Place  Normal(room air) External catheter Weight: 113 lb (51.3 kg) Height:  5\' 3"  (160 cm)  BEHAVIORAL SYMPTOMS/MOOD NEUROLOGICAL BOWEL NUTRITION STATUS      Continent Diet(See D/C Summary)  AMBULATORY STATUS COMMUNICATION OF NEEDS Skin   Extensive Assist Verbally Normal, Surgical wounds                       Personal Care Assistance Level of Assistance  Bathing, Feeding, Dressing Bathing Assistance: Limited assistance Feeding assistance: Independent Dressing Assistance: Limited assistance     Functional Limitations Info  Sight, Hearing, Speech Sight  Info: Adequate Hearing Info: Adequate Speech Info: Adequate    SPECIAL CARE FACTORS FREQUENCY                       Contractures Contractures Info: Not present    Additional Factors Info  Code Status(DNR) Code Status Info: DNR             Current Medications (04/12/2019):  This is the current hospital active medication list Current Facility-Administered Medications  Medication Dose Route Frequency Provider Last Rate Last Admin  . 0.9 %  sodium chloride infusion   Intravenous Continuous Hessie Knows, MD 50 mL/hr at 04/12/19 0500 Rate Verify at 04/12/19 0500  . acetaminophen (TYLENOL) tablet 650 mg  650 mg Oral Q6H PRN Hessie Knows, MD   650 mg at 04/10/19 2101   Or  . acetaminophen (TYLENOL) suppository 650 mg  650 mg Rectal Q6H PRN Hessie Knows, MD      . bisacodyl (DULCOLAX) suppository 10 mg  10 mg Rectal Daily PRN Hessie Knows, MD      . cholecalciferol (VITAMIN D3) tablet 1,000 Units  1,000 Units Oral Daily Hessie Knows, MD   1,000 Units at 04/12/19 [phone]  . docusate sodium (COLACE) capsule 100 mg  100 mg Oral BID Hessie Knows, MD   100 mg at 04/12/19 0941  . enoxaparin (LOVENOX) injection 40 mg  40 mg Subcutaneous Q24H Hessie Knows, MD   40 mg at 04/12/19 0941  . ketorolac (TORADOL) 15 MG/ML injection 15 mg  15 mg Intravenous Q6H PRN Hessie Knows, MD   15 mg at 04/12/19 0412  . magnesium citrate solution  1 Bottle  1 Bottle Oral Once PRN Hessie Knows, MD      . magnesium hydroxide (MILK OF MAGNESIA) suspension 30 mL  30 mL Oral Daily PRN Hessie Knows, MD      . Melatonin TABS 5 mg  5 mg Oral QHS PRN Lang Snow, NP   5 mg at 04/11/19 0020  . metoCLOPramide (REGLAN) tablet 5-10 mg  5-10 mg Oral Q8H PRN Hessie Knows, MD       Or  . metoCLOPramide (REGLAN) injection 5-10 mg  5-10 mg Intravenous Q8H PRN Hessie Knows, MD      . morphine 2 MG/ML injection 2 mg  2 mg Intravenous Q3H PRN Hessie Knows, MD   2 mg at 04/12/19 0120  . multivitamin with  minerals tablet 1 tablet  1 tablet Oral Daily Hessie Knows, MD   1 tablet at 04/12/19 0941  . ondansetron (ZOFRAN) tablet 4 mg  4 mg Oral Q6H PRN Hessie Knows, MD       Or  . ondansetron [facility]) injection 4 mg  4 mg Intravenous Q6H PRN Hessie Knows, MD      . oxyCODONE (Oxy IR/ROXICODONE) immediate release tablet 5 mg  5 mg Oral Q4H PRN Hessie Knows, MD   5 mg at 04/12/19 0522  . pantoprazole (PROTONIX) EC tablet 40 mg  40 mg Oral Daily Val Riles, MD   40 mg at 04/12/19 0941  . predniSONE (DELTASONE) tablet 20 mg  20 mg Oral Q0600 Hessie Knows, MD   20 mg at 04/12/19 [id number]  . senna-docusate (Senokot-S) tablet 1 tablet  1 tablet Oral QHS PRN Hessie Knows, MD         Discharge Medications: Please see discharge summary for a list of discharge medications.  Relevant Imaging Results:  Relevant Lab Results:   Additional Information [place], LCSW

## 2019-04-12 NOTE — Plan of Care (Signed)
  Problem: Activity: Goal: Risk for activity intolerance will decrease Outcome: Progressing   Problem: Elimination: Goal: Will not experience complications related to bowel motility Outcome: Progressing   Problem: Safety: Goal: Ability to remain free from injury will improve Outcome: Progressing   Problem: Clinical Measurements: Goal: Ability to maintain clinical measurements within normal limits will improve Outcome: Progressing   Problem: Activity: Goal: Risk for activity intolerance will decrease Outcome: Progressing   Problem: Coping: Goal: Level of anxiety will decrease Outcome: Progressing   Problem: Elimination: Goal: Will not experience complications related to bowel motility Outcome: Progressing   Problem: Pain Managment: Goal: General experience of comfort will improve Outcome: Progressing

## 2019-04-12 NOTE — Progress Notes (Signed)
PROGRESS NOTE    Melissa [name]  [id number] DOB: July 23, 1928 DOA: 04/09/2019 PCP: [name], MD   Brief Narrative:  Melissa Ammirati Mooreis a 86 y.o.femalewith medical history significant forGERD, who presents to the emergency room with low back pain radiating to the right leg, not improving with outpatient prednisone treatment possible sciatica. Patient states that she has had back pain for several years but over the past 1 month the pain has been more debilitating, preventing her from tending to her usual chores. At baseline she is independent in her ADLs but of late has needed a Rollator to walk and increasing assistance from her husband. Pain is described as severeand sharp in natureand worse with changes of position.Radiates down the left leg. She denies saddle anesthesia, bowel or bladder retention.She denies fever or chills. Had no recent falls or injury ED Course:On arrival in the emergency room, vitals were mostly within normal limits. She had a CT of her lumbar spine which was followed up with a noncontrasted MRI that showed severe L1 compression fracture with significant retropulsion and new moderate spinal stenosis with moderate to severe bilateral foraminal stenosis and mild mass-effect on the conus but no associated lower cord signal abnormality. It also showed superimposed acute to subacute sacral fractures which may be insufficiency fractures in the absence of recent fall or trauma. Urgency room provider contacted neurosurgeon, Dr. [name] who recommended lumbar spinal orthotic for pain, admission for pain control and recommended keeping n.p.o. overnight for possible sacral plasty by Dr. [name] in the a.m. Blood work as well as urinalysis pending at the time of decision to admit.   Assessment & Plan:   Principal Problem:   Intractable low back pain Active Problems:   Compression fracture of lumbosacral vertebra, nontraumatic, initial encounter with retropulsion  Sacral insufficiency fracture   Compression fx, lumbar spine (HCC)   Intractable low back paindue to L1Acute compression fracture, nontraumatic,with retropulsionandSacral insufficiency fracture -pain control -s/pL1 kyphoplasty done by orthopedics Dr. [name] on 04/10/2019 -Oxycodone for pain with IV Toradol as well as IV morphine for breakthrough, still having difficulty controlling pain although moderately improved -LSO brace in place -Follow orthopedics for further recommendation -PT saw the patient in consultation with recommendation for skilled nursing facility, patient somewhat resistant, observe next 24 hours to see progress.   Anemia --Baseline since 2015 --continue to monitor  GERD -Continue home omeprazole   Body mass index is 20.02 kg/m. Interventions:  DVT prophylaxis: SCD/Compression stockings  Code Status: DNR    Code Status Orders  (From admission, onward)         Start     Ordered   04/10/19 1620  Do not attempt resuscitation (DNR)  Continuous    Question Answer Comment  In the event of cardiac or respiratory ARREST Do not call a "code blue"   In the event of cardiac or respiratory ARREST Do not perform Intubation, CPR, defibrillation or ACLS   In the event of cardiac or respiratory ARREST Use medication by any route, position, wound care, and other measures to relive pain and suffering. May use oxygen, suction and manual treatment of airway obstruction as needed for comfort.      04/10/19 1620        Code Status History    Date Active Date Inactive Code Status Order ID Comments User Context   04/09/2019 2055 04/10/2019 1620 Full Code [id number]  [name], MD ED   11/26/2013 1526 11/27/2013 1503 Full Code [id number]  Shuford, Olivia Mackie, PA-C  Inpatient   Advance Care Planning Activity    Advance Directive Documentation     Most Recent Value  Type of Advance Directive  Healthcare Power of Attorney, Living will  Pre-existing out of facility  DNR order (yellow form or pink MOST form)  --  "MOST" Form in Place?  --     Family Communication: Discussed with patient husband Sunday, answered all questions Disposition Plan: Remain the hospital additional day requiring additional physical therapy, IV pain medications.     Consults called: None Admission status: Inpatient   Consultants:    neurosurgery  Procedures:  DG Lumbar Spine 2-3 Views  Result Date: 04/10/2019 CLINICAL DATA:  L1 kyphoplasty and sacroplasty. EXAM: LUMBAR SPINE - 2-3 VIEW; DG C-ARM 1-60 MIN COMPARISON:  None. FINDINGS: Examination demonstrates expected changes after patient's bilateral sacroplasty and L1 kyphoplasty. Moderate degenerative change of the lumbosacral spine. IMPRESSION: Expected changes post L1 kyphoplasty and bilateral sacroplasty. Electronically Signed   By: [name] M.D.   On: 04/10/2019 14:43   CT Lumbar Spine Wo Contrast  Result Date: 04/09/2019 CLINICAL DATA:  Lumbar radiculopathy EXAM: CT LUMBAR SPINE WITHOUT CONTRAST TECHNIQUE: Multidetector CT imaging of the lumbar spine was performed without intravenous contrast administration. Multiplanar CT image reconstructions were also generated. COMPARISON:  Plain films 05/22/2018. FINDINGS: Segmentation: 5 lumbar type vertebrae. Alignment: Minimal retrolisthesis of T11 over T12. Grade I anterolisthesis of L4 over L5 related to degenerative facet disease. Vertebrae: Compression fracture noted at L1 with flattening of the anterior two thirds of the vertebral body and retropulsion of the posterior wall into the spinal canal resulting in moderate spinal canal stenosis. This is new from prior plain films. Compression fractures of the T11 and L4 vertebral bodies are again seen and appear unchanged. Diffuse decrease of the bone density. Paraspinal and other soft tissues: Calcified plaques noted in the abdominal aorta. Disc levels: T10-T11: Mild retropulsion of the superior endplate resulting in mild spinal  canal stenosis. T11-T12: Posterior disc protrusion with endplate spurring and facet degenerative changes causing mild spinal canal narrowing and mild bilateral neural foraminal stenosis. T12-L1: Calcified posterior disc protrusion and facet degenerative changes causing mild spinal canal stenosis. No significant neural foraminal narrowing. L1-L2: Compression fracture as described above with retropulsed fragment causing moderate spinal canal stenosis and extending into the bilateral neural foramen resulting in moderate bilateral stenosis. L2-3: Posterior disc protrusion and facet degenerative changes resulting in mild spinal canal stenosis and mild bilateral neural foraminal narrowing. L3-4: Disc bulge, facet degenerative changes and ligamentum flavum redundancy resulting mild spinal canal stenosis and moderate bilateral neural foraminal narrowing. L4-5: Disc bulge, prominent facet degenerative changes and ligamentum flavum redundancy resulting in moderate spinal canal stenosis and moderate bilateral neural foraminal narrowing. L5-S1: Disc bulge and moderate facet degenerative changes without significant spinal canal or neural foraminal stenosis. IMPRESSION: 1. New compression fracture at L1 with retropulsed fragment causing moderate spinal canal stenosis. 2. Multilevel degenerative changes, more pronounced at L4-5 where there is grade 1 anterolisthesis related to prominent facet degeneration, moderate spinal canal stenosis and moderate bilateral neural foraminal narrowing. 3. Unchanged compression fractures at T11 and L4. Aortic Atherosclerosis (ICD10-I70.0). Electronically Signed   By: Pedro Earls M.D.   On: 04/09/2019 16:15   MR LUMBAR SPINE WO CONTRAST  Result Date: 04/09/2019 CLINICAL DATA:  86 year old female with subacute L4 compression fracture diagnosed by MRI last month. New severe L1 compression fracture on CT earlier today. EXAM: MRI LUMBAR SPINE WITHOUT CONTRAST TECHNIQUE:  Multiplanar, multisequence MR  imaging of the lumbar spine was performed. No intravenous contrast was administered. COMPARISON:  Lumbar spine CT earlier today. Lumbar MRI 03/18/2018. FINDINGS: Segmentation:  Normal, same numbering system as on the comparisons. Alignment: Stable from the CT earlier today. Retropulsion at L1 is new since January, described below. Stable chronic grade 1 anterolisthesis at L4-L5 and L5-S1. Vertebrae: Marrow edema throughout the severely compressed L1 vertebral body, new since 03/18/2018. Retropulsion of the posteroinferior endplate as seen by CT today resulting in moderate spinal stenosis with up to mild mass effect on the conus (series 9, image 9). No marrow edema in the L1 posterior elements. Chronic T12 compression fracture is stable. The mild L4 compression fracture has not progressed since January marrow edema there has resolved. Also new since January confluent bilateral sacral ala marrow edema (series 11, image 14 on the left and image 2 on the right. There is an associated nondisplaced appearing fracture through the central S2-S3 level. Associated presacral fluid or edema. Faint posterior endplate marrow edema at T12 inferiorly and L2-L3 appears to be degenerative in nature. Conus medullaris and cauda equina: Conus extends to the L1 level, as stated above. No signal abnormality in the visible lower thoracic spinal cord or conus. Paraspinal and other soft tissues: Mild presacral fluid or edema as stated above. Negative visible abdominal viscera. Disc levels: Widespread lower thoracic and lumbar spine degeneration is stable since last month with the following exceptions: L1-L2: Bulky posterior disc osteophyte complex in large part due to the L1 posteroinferior endplate retropulsion results in new spinal stenosis with up to mild mass effect on the conus. There is associated new moderate to severe bilateral L1 foraminal stenosis. IMPRESSION: 1. Severe L1 compression fracture as seen  by CT today is new since 03/18/2018. Significant retropulsion results in new moderate spinal stenosis and moderate to severe bilateral foraminal stenosis. Up to mild mass effect on the conus, but no associated lower cord signal abnormality. 2. Superimposed acute to subacute sacral fractures: Bilateral sacral ala and central S2-S3 segment. These may be insufficiency fractures in the absence of recent fall/trauma. 3. Mild L4 compression fracture remain stable since January. Chronic T12 compression fractures. Electronically Signed   By: Genevie Ann M.D.   On: 04/09/2019 18:28   DG C-Arm 1-60 Min  Result Date: 04/10/2019 CLINICAL DATA:  L1 kyphoplasty and sacroplasty. EXAM: LUMBAR SPINE - 2-3 VIEW; DG C-ARM 1-60 MIN COMPARISON:  None. FINDINGS: Examination demonstrates expected changes after patient's bilateral sacroplasty and L1 kyphoplasty. Moderate degenerative change of the lumbosacral spine. IMPRESSION: Expected changes post L1 kyphoplasty and bilateral sacroplasty. Electronically Signed   By: [name] M.D.   On: 04/10/2019 14:43     Antimicrobials:   None   Subjective: Patient was moderately confused this morning reported she felt it was secondary to sleeping medications. Reports she is improving.  Objective: Vitals:   04/11/19 0747 04/11/19 1513 04/11/19 2335 04/12/19 0821  BP: (!) 142/57 (!) 140/58 (!) 160/76 (!) 163/78  Pulse: 77 75 71 82  Resp: 18 16 18    Temp: 97.9 F (36.6 C) 98.4 F (36.9 C) 98.2 F (36.8 C)   TempSrc: Oral  Oral   SpO2: 94% 99% 97% 95%  Weight:      Height:        Intake/Output Summary (Last 24 hours) at 04/12/2019 1100 Last data filed at 04/12/2019 0900 Gross per 24 hour  Intake 1041.95 ml  Output 1500 ml  Net -458.05 ml   Filed Weights   04/09/19  1440  Weight: 51.3 kg    Examination:  General exam: Appears calm and comfortable although mildly confused Respiratory system: Clear to auscultation. Respiratory effort normal. Cardiovascular  system: S1 & S2 heard, RRR. No JVD, murmurs, rubs, gallops or clicks. No pedal edema.  LSO brace in place Gastrointestinal system: Abdomen is nondistended, soft and nontender. No organomegaly or masses felt. Normal bowel sounds heard. Central nervous system: Alert and oriented. No focal neurological deficits. Extremities: Warm well perfused, range of motion moderately limited secondary to pain and recent procedure, neurovascularly intact Skin: No rashes, lesions or ulcers Psychiatry: Judgement and insight impaired secondary mild confusion. Mood & affect appropriate.     Data Reviewed: I have personally reviewed following labs and imaging studies  CBC: Recent Labs  Lab 04/09/19 2043 04/10/19 1100  WBC 7.3 7.0  NEUTROABS 4.8  --   HGB 10.0* 8.8*  HCT 30.5* 26.9*  MCV 97.4 99.3  PLT 464* [id number]*   Basic Metabolic Panel: Recent Labs  Lab 04/09/19 2043 04/10/19 1100  NA 137 138  K 3.7 3.7  CL 101 103  CO2 25 28  GLUCOSE 127* 91  BUN 21 20  CREATININE 0.51 0.59  CALCIUM 8.7* 8.1*  MG  --  2.2  PHOS  --  3.5   GFR: Estimated Creatinine Clearance: 39.4 mL/min (by C-G formula based on SCr of 0.59 mg/dL). Liver Function Tests: Recent Labs  Lab 04/09/19 2043  AST 26  ALT 30  ALKPHOS 185*  BILITOT 0.7  PROT 7.4  ALBUMIN 3.8   No results for input(s): LIPASE, AMYLASE in the last 168 hours. No results for input(s): AMMONIA in the last 168 hours. Coagulation Profile: No results for input(s): INR, PROTIME in the last 168 hours. Cardiac Enzymes: No results for input(s): CKTOTAL, CKMB, CKMBINDEX, TROPONINI in the last 168 hours. BNP (last 3 results) No results for input(s): PROBNP in the last 8760 hours. HbA1C: No results for input(s): HGBA1C in the last 72 hours. CBG: No results for input(s): GLUCAP in the last 168 hours. Lipid Profile: No results for input(s): CHOL, HDL, LDLCALC, TRIG, CHOLHDL, LDLDIRECT in the last 72 hours. Thyroid Function Tests: No results for  input(s): TSH, T4TOTAL, FREET4, T3FREE, THYROIDAB in the last 72 hours. Anemia Panel: No results for input(s): VITAMINB12, FOLATE, FERRITIN, TIBC, IRON, RETICCTPCT in the last 72 hours. Sepsis Labs: No results for input(s): PROCALCITON, LATICACIDVEN in the last 168 hours.  Recent Results (from the past 240 hour(s))  SARS CORONAVIRUS 2 (TAT 6-24 HRS) Nasopharyngeal Nasopharyngeal Swab     Status: None   Collection Time: 04/09/19  8:43 PM   Specimen: Nasopharyngeal Swab  Result Value Ref Range Status   SARS Coronavirus 2 NEGATIVE NEGATIVE Final    Comment: (NOTE) SARS-CoV-2 target nucleic acids are NOT DETECTED. The SARS-CoV-2 RNA is generally detectable in upper and lower respiratory specimens during the acute phase of infection. Negative results do not preclude SARS-CoV-2 infection, do not rule out co-infections with other pathogens, and should not be used as the sole basis for treatment or other patient management decisions. Negative results must be combined with clinical observations, patient history, and epidemiological information. The expected result is Negative. Fact Sheet for Patients: SugarRoll.be Fact Sheet for Healthcare Providers: https://www.woods-mathews.com/ This test is not yet approved or cleared by the [place] FDA and  has been authorized for detection and/or diagnosis of SARS-CoV-2 by FDA under an Emergency Use Authorization (EUA). This EUA will remain  in effect (meaning this test can  be used) for the duration of the COVID-19 declaration under Section 56 4(b)(1) of the Act, 21 U.S.C. section 360bbb-3(b)(1), unless the authorization is terminated or revoked sooner. Performed at [facility], [place] [address] [place],  [address]          Radiology Studies: DG Lumbar Spine 2-3 Views  Result Date: 04/10/2019 CLINICAL DATA:  L1 kyphoplasty and sacroplasty. EXAM: LUMBAR SPINE - 2-3 VIEW; DG C-ARM 1-60  MIN COMPARISON:  None. FINDINGS: Examination demonstrates expected changes after patient's bilateral sacroplasty and L1 kyphoplasty. Moderate degenerative change of the lumbosacral spine. IMPRESSION: Expected changes post L1 kyphoplasty and bilateral sacroplasty. Electronically Signed   By: [name] M.D.   On: 04/10/2019 14:43   DG C-Arm 1-60 Min  Result Date: 04/10/2019 CLINICAL DATA:  L1 kyphoplasty and sacroplasty. EXAM: LUMBAR SPINE - 2-3 VIEW; DG C-ARM 1-60 MIN COMPARISON:  None. FINDINGS: Examination demonstrates expected changes after patient's bilateral sacroplasty and L1 kyphoplasty. Moderate degenerative change of the lumbosacral spine. IMPRESSION: Expected changes post L1 kyphoplasty and bilateral sacroplasty. Electronically Signed   By: [name] M.D.   On: 04/10/2019 14:43        Scheduled Meds: . cholecalciferol  1,000 Units Oral Daily  . docusate sodium  100 mg Oral BID  . enoxaparin (LOVENOX) injection  40 mg Subcutaneous Q24H  . multivitamin with minerals  1 tablet Oral Daily  . pantoprazole  40 mg Oral Daily  . predniSONE  20 mg Oral Q0600   Continuous Infusions: . sodium chloride 50 mL/hr at 04/12/19 0500     LOS: 1 day    Time spent: 35 min    Nicolette Bang, MD [facility]  If 7PM-7AM, please contact night-coverage  04/12/2019, 11:00 AM

## 2019-04-12 NOTE — TOC Progression Note (Signed)
Transition of Care [facility]) - Progression Note    Patient Details  Name: KEMARIA CRUNKLETON MRN: [id number] Date of Birth: 06/13/1928  Transition of [facility]) CM/SW Contact  Boris Sharper, LCSW Phone Number:[phone] 04/12/2019, 3:50 PM  Clinical Narrative:    CSW spoke to pt and her Spouse they gave CSW permission to fax out FL2 for bed offers. Prefers [place] because they live in the community.   TOC will continue to follow for discharge planning needs   Expected Discharge Plan: [place] Barriers to Discharge: Continued Medical Work up  Expected Discharge Plan and Services Expected Discharge Plan: [place] Choice: [place] arrangements for the past 2 months: Single Family Home                                       Social Determinants of Health (SDOH) Interventions    Readmission Risk Interventions No flowsheet data found.

## 2019-04-13 DIAGNOSIS — Z741 Need for assistance with personal care: Secondary | ICD-10-CM | POA: Diagnosis not present

## 2019-04-13 DIAGNOSIS — F0150 Vascular dementia without behavioral disturbance: Secondary | ICD-10-CM | POA: Diagnosis not present

## 2019-04-13 DIAGNOSIS — M810 Age-related osteoporosis without current pathological fracture: Secondary | ICD-10-CM | POA: Diagnosis not present

## 2019-04-13 DIAGNOSIS — Z7401 Bed confinement status: Secondary | ICD-10-CM | POA: Diagnosis not present

## 2019-04-13 DIAGNOSIS — M5416 Radiculopathy, lumbar region: Secondary | ICD-10-CM | POA: Diagnosis not present

## 2019-04-13 DIAGNOSIS — R2689 Other abnormalities of gait and mobility: Secondary | ICD-10-CM | POA: Diagnosis not present

## 2019-04-13 DIAGNOSIS — M8448XD Pathological fracture, other site, subsequent encounter for fracture with routine healing: Secondary | ICD-10-CM | POA: Diagnosis not present

## 2019-04-13 DIAGNOSIS — R296 Repeated falls: Secondary | ICD-10-CM | POA: Diagnosis not present

## 2019-04-13 DIAGNOSIS — M8448XG Pathological fracture, other site, subsequent encounter for fracture with delayed healing: Secondary | ICD-10-CM | POA: Diagnosis not present

## 2019-04-13 DIAGNOSIS — M2550 Pain in unspecified joint: Secondary | ICD-10-CM | POA: Diagnosis not present

## 2019-04-13 DIAGNOSIS — S32010A Wedge compression fracture of first lumbar vertebra, initial encounter for closed fracture: Principal | ICD-10-CM

## 2019-04-13 DIAGNOSIS — M48061 Spinal stenosis, lumbar region without neurogenic claudication: Secondary | ICD-10-CM | POA: Diagnosis not present

## 2019-04-13 DIAGNOSIS — F22 Delusional disorders: Secondary | ICD-10-CM | POA: Diagnosis not present

## 2019-04-13 DIAGNOSIS — D509 Iron deficiency anemia, unspecified: Secondary | ICD-10-CM | POA: Diagnosis not present

## 2019-04-13 DIAGNOSIS — S3210XA Unspecified fracture of sacrum, initial encounter for closed fracture: Secondary | ICD-10-CM | POA: Diagnosis not present

## 2019-04-13 DIAGNOSIS — M8088XD Other osteoporosis with current pathological fracture, vertebra(e), subsequent encounter for fracture with routine healing: Secondary | ICD-10-CM | POA: Diagnosis not present

## 2019-04-13 DIAGNOSIS — S32008D Other fracture of unspecified lumbar vertebra, subsequent encounter for fracture with routine healing: Secondary | ICD-10-CM | POA: Diagnosis not present

## 2019-04-13 DIAGNOSIS — S32000D Wedge compression fracture of unspecified lumbar vertebra, subsequent encounter for fracture with routine healing: Secondary | ICD-10-CM | POA: Diagnosis not present

## 2019-04-13 DIAGNOSIS — M6281 Muscle weakness (generalized): Secondary | ICD-10-CM | POA: Diagnosis not present

## 2019-04-13 DIAGNOSIS — M4800 Spinal stenosis, site unspecified: Secondary | ICD-10-CM | POA: Diagnosis not present

## 2019-04-13 DIAGNOSIS — M4857XA Collapsed vertebra, not elsewhere classified, lumbosacral region, initial encounter for fracture: Secondary | ICD-10-CM | POA: Diagnosis not present

## 2019-04-13 DIAGNOSIS — S32010D Wedge compression fracture of first lumbar vertebra, subsequent encounter for fracture with routine healing: Secondary | ICD-10-CM | POA: Diagnosis not present

## 2019-04-13 DIAGNOSIS — R404 Transient alteration of awareness: Secondary | ICD-10-CM | POA: Diagnosis not present

## 2019-04-13 DIAGNOSIS — K219 Gastro-esophageal reflux disease without esophagitis: Secondary | ICD-10-CM | POA: Diagnosis not present

## 2019-04-13 DIAGNOSIS — M5442 Lumbago with sciatica, left side: Secondary | ICD-10-CM | POA: Diagnosis not present

## 2019-04-13 DIAGNOSIS — D649 Anemia, unspecified: Secondary | ICD-10-CM | POA: Diagnosis not present

## 2019-04-13 DIAGNOSIS — M5489 Other dorsalgia: Secondary | ICD-10-CM | POA: Diagnosis not present

## 2019-04-13 DIAGNOSIS — G4709 Other insomnia: Secondary | ICD-10-CM | POA: Diagnosis not present

## 2019-04-13 DIAGNOSIS — R278 Other lack of coordination: Secondary | ICD-10-CM | POA: Diagnosis not present

## 2019-04-13 DIAGNOSIS — M545 Low back pain: Secondary | ICD-10-CM | POA: Diagnosis not present

## 2019-04-13 DIAGNOSIS — R52 Pain, unspecified: Secondary | ICD-10-CM | POA: Diagnosis not present

## 2019-04-13 DIAGNOSIS — F0390 Unspecified dementia without behavioral disturbance: Secondary | ICD-10-CM | POA: Diagnosis not present

## 2019-04-13 LAB — RESPIRATORY PANEL BY RT PCR (FLU A&B, COVID)
Influenza A by PCR: NEGATIVE
Influenza B by PCR: NEGATIVE
SARS Coronavirus 2 by RT PCR: NEGATIVE

## 2019-04-13 MED ORDER — OXYCODONE HCL 5 MG PO TABS: 5.0000 mg | ORAL_TABLET | Freq: Four times a day (QID) | ORAL | 0 refills | Status: DC | PRN

## 2019-04-13 MED ORDER — ALUM & MAG HYDROXIDE-SIMETH 200-200-20 MG/5ML PO SUSP
30.0000 mL | Freq: Four times a day (QID) | ORAL | Status: DC | PRN
  Administered 2019-04-13: 30 mL via ORAL
  Filled 2019-04-13: qty 30

## 2019-04-13 MED ORDER — IBUPROFEN 400 MG PO TABS: 400.0000 mg | ORAL_TABLET | Freq: Four times a day (QID) | ORAL | 0 refills | Status: DC | PRN

## 2019-04-13 NOTE — Progress Notes (Signed)
Patient complaining of some radiating left leg pain, suspect this is from some stenosis partially related to her L1 fracture.  Expect this to resolve with time.  She has had confusion over the weekend and says she has not gotten out of bed.  Hopefully she can get moving better and this will help with her pain and get her to rehab.

## 2019-04-13 NOTE — Progress Notes (Signed)
Physical Therapy Treatment Patient Details Name: Melissa [name] MRN: [id number] DOB: 1928-08-12 Today's Date: 04/13/2019    History of Present Illness Patient is an 86 year old female admitted with intractable low back pain. Found to have severe L1 compression fracture, acute to subacute sacral fractures. S/P kyphoplasty and sacroplasty. PMH includes: GERD.    PT Comments    Pt reports increased pain today.  Stated she has not been out of bed since procedure.  Reminded pt she has had therapy daily with mobility and up to chair.  Participated in exercises as described below.  Increased pain noted with LE ex today.  To EOB with mod a x 1 and once sitting, she has increased difficulty maintaining balance with x 2 LOB post requiring min a to correct.  She is unable to remain sitting to allow for back brace to be donned and stated she needs to lay back down and initiates return to supine.  She is positioned in R side lying with pillow between knees for comfort.  She does report some pain in her chest.  She denied it is cardiac in nature and feels it is all connected to her hip/LE pain.  BP and HR stable.  Reported to RN.   Requested pain medication for pt.   Follow Up Recommendations  SNF     Equipment Recommendations       Recommendations for Other Services       Precautions / Restrictions Precautions Precautions: Fall Required Braces or Orthoses: Spinal Brace Spinal Brace: Lumbar corset Other Brace: lumbar corset Restrictions Weight Bearing Restrictions: No Other Position/Activity Restrictions: WBAT    Mobility  Bed Mobility Overal bed mobility: Needs Assistance Bed Mobility: Rolling;Sidelying to Sit Rolling: Mod assist;Min assist Sidelying to sit: Mod assist Supine to sit: Mod assist Sit to supine: Mod assist   General bed mobility comments: pain limited, requires assist with all mobility  Transfers                 General transfer comment: unable due to  pain  Ambulation/Gait             General Gait Details: unable due to pain   Stairs             Wheelchair Mobility    Modified Rankin (Stroke Patients Only)       Balance Overall balance assessment: Needs assistance Sitting-balance support: Feet supported Sitting balance-Leahy Scale: Poor Sitting balance - Comments: more pain today which affects balance,  close in gaurd/assist to prevent post LOB                                    Cognition Arousal/Alertness: Awake/alert Behavior During Therapy: WFL for tasks assessed/performed Overall Cognitive Status: No family/caregiver present to determine baseline cognitive functioning                                 General Comments: less anxious today, more clear today vs yesterday but some confusion remains as she repeats questions at times.      Exercises Other Exercises Other Exercises: supine AAROM heel slides    General Comments        Pertinent Vitals/Pain Pain Assessment: 0-10 Pain Score: 8  Pain Location: L hip and chest - pt stated she did not feel it was cardiac in nature "Oh no"  Pain Descriptors / Indicators: Aching;Discomfort;Grimacing Pain Intervention(s): Limited activity within patient's tolerance;Monitored during session;Patient requesting pain meds-RN notified;Repositioned    Home Living                      Prior Function            PT Goals (current goals can now be found in the care plan section) Progress towards PT goals: Progressing toward goals    Frequency    BID      PT Plan Current plan remains appropriate    Co-evaluation              AM-PAC PT "6 Clicks" Mobility   Outcome Measure  Help needed turning from your back to your side while in a flat bed without using bedrails?: A Little Help needed moving from lying on your back to sitting on the side of a flat bed without using bedrails?: A Lot Help needed moving to and from  a bed to a chair (including a wheelchair)?: A Lot Help needed standing up from a chair using your arms (e.g., wheelchair or bedside chair)?: Total Help needed to walk in hospital room?: Total Help needed climbing 3-5 steps with a railing? : Total 6 Click Score: 10    End of Session   Activity Tolerance: Patient limited by fatigue;Patient limited by pain Patient left: in bed;with call bell/phone within reach;with bed alarm set Nurse Communication: Mobility status;Other (comment);Patient requests pain meds Pain - Right/Left: Left Pain - part of body: Hip     Time: [id number] PT Time Calculation (min) (ACUTE ONLY): 21 min  Charges:  $Therapeutic Activity: 8-22 mins                    Chesley Noon, PTA 04/13/19, 9:47 AM

## 2019-04-13 NOTE — TOC Transition Note (Signed)
Transition of Care [facility]) - CM/SW Discharge Note   Patient Details  Name: Melissa [name] MRN: [id number] Date of Birth: 1928/05/05  [facility]) CM/SW Contact:  Su Hilt, RN Phone Number: 04/13/2019, 3:12 PM   Clinical Narrative:   Patient to discharge to [facility] today via EMS transport.  The bedside Nurse is to call report to [facility], I called EMS to arrange transport and they are coming ASAP.  I called and left a message for Jenny Reichmann the spouse about the DC.  I provided my contact information, The bedside Nurse is aware of the DC packet on the chart and [facility] being called    Final next level of care: Skilled Nursing Facility Barriers to Discharge: Continued Medical Work up   Patient Goals and CMS Choice Patient states their goals for this hospitalization and ongoing recovery are:: to go home CMS Medicare.gov Compare Post Acute Care list provided to:: Patient Choice offered to / list presented to : Patient, Spouse  Discharge Placement              Patient chooses bed at: [facility]     Post Acute Care Choice: [place]                               Social Determinants of Health (SDOH) Interventions     Readmission Risk Interventions No flowsheet data found.

## 2019-04-13 NOTE — Anesthesia Postprocedure Evaluation (Signed)
Anesthesia Post Note  Patient: Melissa [name]  Procedure(s) Performed: KYPHOPLASTY (N/A Back)  Patient location during evaluation: PACU Anesthesia Type: General Level of consciousness: awake and alert and oriented Pain management: pain level controlled Vital Signs Assessment: post-procedure vital signs reviewed and stable Respiratory status: spontaneous breathing, nonlabored ventilation and respiratory function stable Cardiovascular status: blood pressure returned to baseline and stable Postop Assessment: no signs of nausea or vomiting Anesthetic complications: no     Last Vitals:  Vitals:   04/12/19 1701 04/12/19 2306  BP: (!) 147/72 (!) 160/59  Pulse: 81 69  Resp:  16  Temp: 36.8 C 36.8 C  SpO2: 96% 94%    Last Pain:  Vitals:   04/13/19 0553  TempSrc:   PainSc: 8                  Zaylon Bossier

## 2019-04-13 NOTE — Discharge Summary (Signed)
[facility] NAME: Melissa [name]    MR#:  [id number]  DATE OF BIRTH:  [birth year]  DATE OF ADMISSION:  04/09/2019   ADMITTING PHYSICIAN: Marcell Anger, MD  DATE OF DISCHARGE: 04/13/2019  PRIMARY CARE PHYSICIAN: [name], MD   ADMISSION DIAGNOSIS:  Spinal stenosis of lumbar region without neurogenic claudication [M48.061] Intractable low back pain [M54.5] Sacral insufficiency fracture, initial encounter [M84.48XA] Closed compression fracture of body of L1 vertebra (HCC) [S32.010A] Compression fx, lumbar spine ([place]) [S32.000A] DISCHARGE DIAGNOSIS:  Principal Problem:   Intractable low back pain Active Problems:   Compression fracture of lumbosacral vertebra, nontraumatic, initial encounter with retropulsion   Sacral insufficiency fracture   Compression fx, lumbar spine ([place])  SECONDARY DIAGNOSIS:   Past Medical History:  Diagnosis Date  . Arthritis   . Chest pain    a. 05/2015 - post-prandial.  . GERD (gastroesophageal reflux disease)    a. takes Omeprazole daily, prev seen by D. Brodie.  Marland Kitchen Heart murmur    a. 05/2013 Echo: EF 55-60%, gr2 DD, mild AI/MR.  Marland Kitchen History of blood transfusion    no abnormal reaction noted   . History of migraine headaches    a. last 10/2013  . Iron deficiency anemia   . Joint pain   . PONV (postoperative nausea and vomiting)   . Rectocele 2011   HOSPITAL COURSE:  Melissa [name] a 86 y.o.femalewith medical history significant forGERD admitted for worsening sciatica pain and new L1 compression fracture with worsening low back pain radiating to the right leg, not improving with outpatient prednisone treatment. She denies saddle anesthesia, bowel or bladder retention.She denies fever or chills. Had no recent falls or injury  Intractable low back paindue to L1Acute compression fracture, nontraumatic,with retropulsionandSacral insufficiency fracture -MRI that showed severe L1  compression fracture with significant retropulsion and new moderate spinal stenosis with moderate to severe bilateral foraminal stenosis and mild mass-effect on the conus but no associated lower cord signal abnormality. It also showed superimposed acute to subacute sacral fractures which may be insufficiency fractures in the absence of recent fall or trauma. ED provider contacted neurosurgeon, Dr. [name] who recommended lumbar spinal orthotic for pain,  -s/pL1 kyphoplasty done by orthopedics Dr. [name] on 04/10/2019 -Oxycodone as needed and ibuprofen as needed -LSO brace in place -Outpatient Ortho follow-up -PT saw the patient in consultation with recommendation for skilled nursing facility  Anemia --Stable  GERD -Continue home omeprazole DISCHARGE CONDITIONS:  Stable CONSULTS OBTAINED:  Treatment Team:  Hessie Knows, MD DRUG ALLERGIES:   Allergies  Allergen Reactions  . Sulfonamide Derivatives Hives   DISCHARGE MEDICATIONS:   Allergies as of 04/13/2019      Reactions   Sulfonamide Derivatives Hives      Medication List    TAKE these medications   cholecalciferol 1000 units tablet Commonly known as: VITAMIN D Take 1,000 Units by mouth daily.   ibuprofen 400 MG tablet Commonly known as: ADVIL Take 1 tablet (400 mg total) by mouth every 6 (six) hours as needed.   multivitamin with minerals Tabs tablet Take 1 tablet by mouth daily.   mupirocin ointment 2 % Commonly known as: Bactroban Apply to wound after soaking BID   omeprazole 20 MG tablet Commonly known as: PRILOSEC OTC Take 40 mg by mouth daily.   oxyCODONE 5 MG immediate release tablet Commonly known as: Oxy IR/ROXICODONE Take 1 tablet (5 mg total) by mouth every  6 (six) hours as needed for severe pain or breakthrough pain.   predniSONE 20 MG tablet Commonly known as: DELTASONE Take 20-40 mg by mouth daily at 6 (six) AM.   PROBIOTIC DAILY PO Take 1 tablet by mouth daily at 6 (six) AM.    trolamine salicylate 10 % cream Commonly known as: ASPERCREME Apply 1 application topically as needed for muscle pain.      DISCHARGE INSTRUCTIONS:   DIET:  Cardiac diet DISCHARGE CONDITION:  Good ACTIVITY:  Activity as tolerated OXYGEN:  Home Oxygen: No.  Oxygen Delivery: room air DISCHARGE LOCATION:  [facility] skilled nursing facility -palliative care to follow  If you experience worsening of your admission symptoms, develop shortness of breath, life threatening emergency, suicidal or homicidal thoughts you must seek medical attention immediately by calling 911 or calling your MD immediately  if symptoms less severe.  You Must read complete instructions/literature along with all the possible adverse reactions/side effects for all the Medicines you take and that have been prescribed to you. Take any new Medicines after you have completely understood and accpet all the possible adverse reactions/side effects.   Please note  You were cared for by a hospitalist during your hospital stay. If you have any questions about your discharge medications or the care you received while you were in the hospital after you are discharged, you can call the unit and asked to speak with the hospitalist on call if the hospitalist that took care of you is not available. Once you are discharged, your primary care physician will handle any further medical issues. Please note that NO REFILLS for any discharge medications will be authorized once you are discharged, as it is imperative that you return to your primary care physician (or establish a relationship with a primary care physician if you do not have one) for your aftercare needs so that they can reassess your need for medications and monitor your lab values.    On the day of Discharge:  VITAL SIGNS:  Blood pressure 136/64, pulse 83, temperature 97.8 F (36.6 C), resp. rate 20, height 5\' 3"  (1.6 m), weight 51.3 kg, SpO2 96 %. PHYSICAL  EXAMINATION:  GENERAL:  86 y.o.-year-old patient lying in the bed with no acute distress.  EYES: Pupils equal, round, reactive to light and accommodation. No scleral icterus. Extraocular muscles intact.  HEENT: Head atraumatic, normocephalic. Oropharynx and nasopharynx clear.  NECK:  Supple, no jugular venous distention. No thyroid enlargement, no tenderness.  LUNGS: Normal breath sounds bilaterally, no wheezing, rales,rhonchi or crepitation. No use of accessory muscles of respiration.  CARDIOVASCULAR: S1, S2 normal. No murmurs, rubs, or gallops.  ABDOMEN: Soft, non-tender, non-distended. Bowel sounds present. No organomegaly or mass.  EXTREMITIES: No pedal edema, cyanosis, or clubbing.  NEUROLOGIC: Cranial nerves II through XII are intact. Muscle strength 5/5 in all extremities. Sensation intact. Gait not checked.  PSYCHIATRIC: The patient is alert and oriented x 3.  SKIN: No obvious rash, lesion, or ulcer.  DATA REVIEW:   CBC Recent Labs  Lab 04/10/19 1100  WBC 7.0  HGB 8.8*  HCT 26.9*  PLT 423*    Chemistries  Recent Labs  Lab 04/09/19 2043 04/09/19 2043 04/10/19 1100  NA 137   < > 138  K 3.7   < > 3.7  CL 101   < > 103  CO2 25   < > 28  GLUCOSE 127*   < > 91  BUN 21   < > 20  CREATININE  0.51   < > 0.59  CALCIUM 8.7*   < > 8.1*  MG  --   --  2.2  AST 26  --   --   ALT 30  --   --   ALKPHOS 185*  --   --   BILITOT 0.7  --   --    < > = values in this interval not displayed.     Outpatient follow-up  Contact information for follow-up providers    Hessie Knows, MD On 04/24/2019.   Specialty: Orthopedic Surgery Why: Appointment is made for 10:00 on Friday, March 5 for recheck Contact information: [place] [address] [phone]        [name], MD. Schedule an appointment as soon as possible for a visit in 1 week(s).   Specialty: Internal Medicine Contact information: [address] [place] [address] [phone]            Contact information for after-discharge care    Destination    [facility] .   Service: Skilled Nursing Contact information: [place] [phone]                   Management plans discussed with the patient, family and they are in agreement.  CODE STATUS: DNR   TOTAL TIME TAKING CARE OF THIS PATIENT: 45 minutes.    Max Sane M.D on 04/13/2019 at 2:52 PM  [facility]   CC: Primary care physician; [name], MD   Note: This dictation was prepared with Dragon dictation along with smaller phrase technology. Any transcriptional errors that result from this process are unintentional.

## 2019-04-13 NOTE — Progress Notes (Signed)
SLP Cancellation Note  Patient Details Name: ADRAYA HOWTON MRN: [id number] DOB: 1928-08-01   Cancelled treatment:       Reason Eval/Treat Not Completed: (chart reviewed; consulted MD/NSG re: pt's status). Pt c/o difficulty swallowing Pills w/ NSG this AM. She pointed to, and indicated clearly, her mid-sternum area. She stated to [place] that "they feel stuck sometimes". Post discussion w/ MD re: concern for Esophageal phase dysmotility, recommended Pills be given in Puree -- whole vs crushed. MD agreed. NSG will f/u w/ this w/ pt. ST services will f/u tomorrow if any further needs.     Orinda Kenner, MS, CCC-SLP Nakaila Freeze 04/13/2019, 1:24 PM

## 2019-04-13 NOTE — TOC Progression Note (Signed)
Transition of Care [facility]) - Progression Note    Patient Details  Name: Melissa [name] MRN: [id number] Date of Birth: [birth year]  Transition of Care [facility]) CM/SW [place], RN Phone Number: 04/13/2019, 10:31 AM  Clinical Narrative:   Seth Bake with [facility] states that they can accept the patient today    Expected Discharge Plan: [place] Barriers to Discharge: Continued Medical Work up  Expected Discharge Plan and Services Expected Discharge Plan: [place] Choice: [place] arrangements for the past 2 months: Single Family Home                                       Social Determinants of Health (SDOH) Interventions    Readmission Risk Interventions No flowsheet data found.

## 2019-04-13 NOTE — Plan of Care (Signed)
Patient discharged to [facility] per MD order. Report called to Gerald Dexter RN at facility. EMS called for transport.

## 2019-04-13 NOTE — TOC Progression Note (Signed)
Transition of Care [facility]) - Progression Note    Patient Details  Name: Melissa [name] MRN: [id number] Date of Birth: 17-Oct-1928  Transition of Care [facility]) CM/SW [place], RN Phone Number: 04/13/2019, 10:24 AM  Clinical Narrative:   Spoke to [facility] and she will check on availability and call me back to see if the patient can DC to [facility] today    Expected Discharge Plan: [place] Barriers to Discharge: Continued Medical Work up  Expected Discharge Plan and Services Expected Discharge Plan: [place] Choice: [place] arrangements for the past 2 months: Single Family Home                                       Social Determinants of Health (SDOH) Interventions    Readmission Risk Interventions No flowsheet data found.

## 2019-04-14 LAB — VITAMIN D 25 HYDROXY (VIT D DEFICIENCY, FRACTURES): Vit D, 25-Hydroxy: 31.3 ng/mL (ref 30–100)

## 2019-04-15 DIAGNOSIS — S3210XA Unspecified fracture of sacrum, initial encounter for closed fracture: Secondary | ICD-10-CM | POA: Diagnosis not present

## 2019-04-15 DIAGNOSIS — S32010A Wedge compression fracture of first lumbar vertebra, initial encounter for closed fracture: Secondary | ICD-10-CM | POA: Diagnosis not present

## 2019-04-15 DIAGNOSIS — M4800 Spinal stenosis, site unspecified: Secondary | ICD-10-CM | POA: Diagnosis not present

## 2019-04-15 LAB — SURGICAL PATHOLOGY

## 2019-04-24 DIAGNOSIS — S32000D Wedge compression fracture of unspecified lumbar vertebra, subsequent encounter for fracture with routine healing: Secondary | ICD-10-CM | POA: Diagnosis not present

## 2019-04-28 DIAGNOSIS — K219 Gastro-esophageal reflux disease without esophagitis: Secondary | ICD-10-CM | POA: Diagnosis not present

## 2019-04-28 DIAGNOSIS — M5416 Radiculopathy, lumbar region: Secondary | ICD-10-CM | POA: Diagnosis not present

## 2019-04-28 DIAGNOSIS — M810 Age-related osteoporosis without current pathological fracture: Secondary | ICD-10-CM

## 2019-04-28 DIAGNOSIS — S32010A Wedge compression fracture of first lumbar vertebra, initial encounter for closed fracture: Secondary | ICD-10-CM | POA: Diagnosis not present

## 2019-04-28 DIAGNOSIS — F0150 Vascular dementia without behavioral disturbance: Secondary | ICD-10-CM | POA: Diagnosis not present

## 2019-05-21 DIAGNOSIS — R293 Abnormal posture: Secondary | ICD-10-CM | POA: Diagnosis not present

## 2019-05-21 DIAGNOSIS — R2689 Other abnormalities of gait and mobility: Secondary | ICD-10-CM | POA: Diagnosis not present

## 2019-05-21 DIAGNOSIS — R278 Other lack of coordination: Secondary | ICD-10-CM | POA: Diagnosis not present

## 2019-05-21 DIAGNOSIS — R4189 Other symptoms and signs involving cognitive functions and awareness: Secondary | ICD-10-CM | POA: Diagnosis not present

## 2019-05-21 DIAGNOSIS — R262 Difficulty in walking, not elsewhere classified: Secondary | ICD-10-CM | POA: Diagnosis not present

## 2019-05-21 DIAGNOSIS — M8448XD Pathological fracture, other site, subsequent encounter for fracture with routine healing: Secondary | ICD-10-CM | POA: Diagnosis not present

## 2019-05-21 DIAGNOSIS — M6281 Muscle weakness (generalized): Secondary | ICD-10-CM | POA: Diagnosis not present

## 2019-05-25 DIAGNOSIS — R278 Other lack of coordination: Secondary | ICD-10-CM | POA: Diagnosis not present

## 2019-05-25 DIAGNOSIS — R4189 Other symptoms and signs involving cognitive functions and awareness: Secondary | ICD-10-CM | POA: Diagnosis not present

## 2019-05-25 DIAGNOSIS — M8448XD Pathological fracture, other site, subsequent encounter for fracture with routine healing: Secondary | ICD-10-CM | POA: Diagnosis not present

## 2019-05-25 DIAGNOSIS — R2689 Other abnormalities of gait and mobility: Secondary | ICD-10-CM | POA: Diagnosis not present

## 2019-05-25 DIAGNOSIS — M6281 Muscle weakness (generalized): Secondary | ICD-10-CM | POA: Diagnosis not present

## 2019-05-25 DIAGNOSIS — R262 Difficulty in walking, not elsewhere classified: Secondary | ICD-10-CM | POA: Diagnosis not present

## 2019-05-27 DIAGNOSIS — R278 Other lack of coordination: Secondary | ICD-10-CM | POA: Diagnosis not present

## 2019-05-27 DIAGNOSIS — M8448XD Pathological fracture, other site, subsequent encounter for fracture with routine healing: Secondary | ICD-10-CM | POA: Diagnosis not present

## 2019-05-27 DIAGNOSIS — R2689 Other abnormalities of gait and mobility: Secondary | ICD-10-CM | POA: Diagnosis not present

## 2019-05-27 DIAGNOSIS — R262 Difficulty in walking, not elsewhere classified: Secondary | ICD-10-CM | POA: Diagnosis not present

## 2019-05-27 DIAGNOSIS — M6281 Muscle weakness (generalized): Secondary | ICD-10-CM | POA: Diagnosis not present

## 2019-05-27 DIAGNOSIS — R4189 Other symptoms and signs involving cognitive functions and awareness: Secondary | ICD-10-CM | POA: Diagnosis not present

## 2019-05-29 DIAGNOSIS — R2689 Other abnormalities of gait and mobility: Secondary | ICD-10-CM | POA: Diagnosis not present

## 2019-05-29 DIAGNOSIS — M6281 Muscle weakness (generalized): Secondary | ICD-10-CM | POA: Diagnosis not present

## 2019-05-29 DIAGNOSIS — R4189 Other symptoms and signs involving cognitive functions and awareness: Secondary | ICD-10-CM | POA: Diagnosis not present

## 2019-05-29 DIAGNOSIS — R262 Difficulty in walking, not elsewhere classified: Secondary | ICD-10-CM | POA: Diagnosis not present

## 2019-05-29 DIAGNOSIS — M8448XD Pathological fracture, other site, subsequent encounter for fracture with routine healing: Secondary | ICD-10-CM | POA: Diagnosis not present

## 2019-05-29 DIAGNOSIS — R278 Other lack of coordination: Secondary | ICD-10-CM | POA: Diagnosis not present

## 2019-06-01 DIAGNOSIS — R4189 Other symptoms and signs involving cognitive functions and awareness: Secondary | ICD-10-CM | POA: Diagnosis not present

## 2019-06-01 DIAGNOSIS — M8448XD Pathological fracture, other site, subsequent encounter for fracture with routine healing: Secondary | ICD-10-CM | POA: Diagnosis not present

## 2019-06-01 DIAGNOSIS — R262 Difficulty in walking, not elsewhere classified: Secondary | ICD-10-CM | POA: Diagnosis not present

## 2019-06-01 DIAGNOSIS — M6281 Muscle weakness (generalized): Secondary | ICD-10-CM | POA: Diagnosis not present

## 2019-06-01 DIAGNOSIS — R278 Other lack of coordination: Secondary | ICD-10-CM | POA: Diagnosis not present

## 2019-06-01 DIAGNOSIS — R2689 Other abnormalities of gait and mobility: Secondary | ICD-10-CM | POA: Diagnosis not present

## 2019-06-09 DIAGNOSIS — R350 Frequency of micturition: Secondary | ICD-10-CM | POA: Diagnosis not present

## 2019-06-09 DIAGNOSIS — R946 Abnormal results of thyroid function studies: Secondary | ICD-10-CM | POA: Diagnosis not present

## 2019-06-09 DIAGNOSIS — R7989 Other specified abnormal findings of blood chemistry: Secondary | ICD-10-CM | POA: Diagnosis not present

## 2019-06-09 DIAGNOSIS — I351 Nonrheumatic aortic (valve) insufficiency: Secondary | ICD-10-CM | POA: Diagnosis not present

## 2019-06-09 DIAGNOSIS — Z23 Encounter for immunization: Secondary | ICD-10-CM | POA: Diagnosis not present

## 2019-06-09 DIAGNOSIS — Z79899 Other long term (current) drug therapy: Secondary | ICD-10-CM | POA: Diagnosis not present

## 2019-06-09 DIAGNOSIS — Z1389 Encounter for screening for other disorder: Secondary | ICD-10-CM | POA: Diagnosis not present

## 2019-06-09 DIAGNOSIS — I7090 Unspecified atherosclerosis: Secondary | ICD-10-CM | POA: Diagnosis not present

## 2019-06-09 DIAGNOSIS — Z0000 Encounter for general adult medical examination without abnormal findings: Secondary | ICD-10-CM | POA: Diagnosis not present

## 2019-06-09 DIAGNOSIS — M810 Age-related osteoporosis without current pathological fracture: Secondary | ICD-10-CM | POA: Diagnosis not present

## 2019-06-09 DIAGNOSIS — K219 Gastro-esophageal reflux disease without esophagitis: Secondary | ICD-10-CM | POA: Diagnosis not present

## 2019-06-09 DIAGNOSIS — K9049 Malabsorption due to intolerance, not elsewhere classified: Secondary | ICD-10-CM | POA: Diagnosis not present

## 2019-06-16 DIAGNOSIS — R278 Other lack of coordination: Secondary | ICD-10-CM | POA: Diagnosis not present

## 2019-06-16 DIAGNOSIS — M6281 Muscle weakness (generalized): Secondary | ICD-10-CM | POA: Diagnosis not present

## 2019-06-16 DIAGNOSIS — R2689 Other abnormalities of gait and mobility: Secondary | ICD-10-CM | POA: Diagnosis not present

## 2019-06-16 DIAGNOSIS — M8448XD Pathological fracture, other site, subsequent encounter for fracture with routine healing: Secondary | ICD-10-CM | POA: Diagnosis not present

## 2019-06-16 DIAGNOSIS — R262 Difficulty in walking, not elsewhere classified: Secondary | ICD-10-CM | POA: Diagnosis not present

## 2019-06-16 DIAGNOSIS — R4189 Other symptoms and signs involving cognitive functions and awareness: Secondary | ICD-10-CM | POA: Diagnosis not present

## 2019-06-18 DIAGNOSIS — R4189 Other symptoms and signs involving cognitive functions and awareness: Secondary | ICD-10-CM | POA: Diagnosis not present

## 2019-06-18 DIAGNOSIS — M6281 Muscle weakness (generalized): Secondary | ICD-10-CM | POA: Diagnosis not present

## 2019-06-18 DIAGNOSIS — M8448XD Pathological fracture, other site, subsequent encounter for fracture with routine healing: Secondary | ICD-10-CM | POA: Diagnosis not present

## 2019-06-18 DIAGNOSIS — R278 Other lack of coordination: Secondary | ICD-10-CM | POA: Diagnosis not present

## 2019-06-18 DIAGNOSIS — R262 Difficulty in walking, not elsewhere classified: Secondary | ICD-10-CM | POA: Diagnosis not present

## 2019-06-18 DIAGNOSIS — R2689 Other abnormalities of gait and mobility: Secondary | ICD-10-CM | POA: Diagnosis not present

## 2019-06-19 DIAGNOSIS — R4189 Other symptoms and signs involving cognitive functions and awareness: Secondary | ICD-10-CM | POA: Diagnosis not present

## 2019-06-19 DIAGNOSIS — M6281 Muscle weakness (generalized): Secondary | ICD-10-CM | POA: Diagnosis not present

## 2019-06-19 DIAGNOSIS — R262 Difficulty in walking, not elsewhere classified: Secondary | ICD-10-CM | POA: Diagnosis not present

## 2019-06-19 DIAGNOSIS — R278 Other lack of coordination: Secondary | ICD-10-CM | POA: Diagnosis not present

## 2019-06-19 DIAGNOSIS — R2689 Other abnormalities of gait and mobility: Secondary | ICD-10-CM | POA: Diagnosis not present

## 2019-06-19 DIAGNOSIS — M8448XD Pathological fracture, other site, subsequent encounter for fracture with routine healing: Secondary | ICD-10-CM | POA: Diagnosis not present

## 2019-10-17 ENCOUNTER — Emergency Department: Payer: Medicare Other

## 2019-10-17 ENCOUNTER — Other Ambulatory Visit: Payer: Self-pay

## 2019-10-17 ENCOUNTER — Emergency Department
Admission: EM | Admit: 2019-10-17 | Discharge: 2019-10-17 | Disposition: A | Discharge status: Home / Self Care | Payer: Medicare Other | Attending: Student in an Organized Health Care Education/Training Program | Admitting: Student in an Organized Health Care Education/Training Program

## 2019-10-17 DIAGNOSIS — Z79899 Other long term (current) drug therapy: Secondary | ICD-10-CM | POA: Insufficient documentation

## 2019-10-17 DIAGNOSIS — M79621 Pain in right upper arm: Secondary | ICD-10-CM | POA: Diagnosis not present

## 2019-10-17 DIAGNOSIS — M25511 Pain in right shoulder: Secondary | ICD-10-CM | POA: Insufficient documentation

## 2019-10-17 DIAGNOSIS — Z96653 Presence of artificial knee joint, bilateral: Secondary | ICD-10-CM | POA: Insufficient documentation

## 2019-10-17 MED ORDER — TRAMADOL HCL 50 MG PO TABS: 50.0000 mg | ORAL_TABLET | Freq: Four times a day (QID) | ORAL | 0 refills | Status: DC | PRN

## 2019-10-17 NOTE — Discharge Instructions (Signed)
Follow-up with your regular doctor as needed.  Follow-up with orthopedics for continued right shoulder pain.  Your x-rays are negative today.  Return emergency department for worsening take Tylenol for pain.

## 2019-10-17 NOTE — ED Notes (Signed)
See triage note  Presents with right shoulder pain  States pain started yesterday  No injury  States she is also having some discomfort in neck  No deformity

## 2019-10-17 NOTE — ED Triage Notes (Signed)
Pt states that her R shoulder started hurting yesterday- pt denies injury or excessive use- pt denies chest pain- pt states she took tylenol last night but did not help much

## 2019-10-17 NOTE — ED Provider Notes (Signed)
[facility] Emergency Department Provider Note  ____________________________________________   First MD Initiated Contact with Patient 10/17/19 1009     (approximate)  I have reviewed the triage vital signs and the nursing notes.   HISTORY  Chief Complaint Shoulder Pain    HPI Melissa [name] is a 86 y.o. female presents emergency department complaint of right shoulder pain.  Patient states it hurts in the upper part of her arm across the shoulder.  Hurts to move it.  States she had to sit upright last night as it hurt to lay back on it.  She denies any numbness or tingling.  No known injury.  Patient has a history of shoulder problems.    Past Medical History:  Diagnosis Date  . Arthritis   . Chest pain    a. 05/2015 - post-prandial.  . GERD (gastroesophageal reflux disease)    a. takes Omeprazole daily, prev seen by D. Brodie.  Marland Kitchen Heart murmur    a. 05/2013 Echo: EF 55-60%, gr2 DD, mild AI/MR.  Marland Kitchen History of blood transfusion    no abnormal reaction noted   . History of migraine headaches    a. last 10/2013  . Iron deficiency anemia   . Joint pain   . PONV (postoperative nausea and vomiting)   . Rectocele 2011    Patient Active Problem List   Diagnosis Date Noted  . Compression fx, lumbar spine ([place]) 04/11/2019  . Intractable low back pain 04/09/2019  . Compression fracture of lumbosacral vertebra, nontraumatic, initial encounter with retropulsion 04/09/2019  . Sacral insufficiency fracture 04/09/2019  . AI (aortic incompetence) 12/30/2017  . OP (osteoporosis) 12/30/2017  . S/P shoulder replacement 11/26/2013  . ANEMIA, IRON DEFICIENCY 04/05/2009  . HEMORRHOIDS, INTERNAL 04/05/2009  . GERD 04/05/2009  . CONSTIPATION 04/05/2009  . ARTHRITIS 04/05/2009    Past Surgical History:  Procedure Laterality Date  . cataract surgery  Bilateral    R 03/2001; L 05/2001.  Marland Kitchen COLONOSCOPY    . HYSTEROSCOPY  11/1999   w/ resection of endometrial polyp  by uterine curetting.  Marland Kitchen JOINT REPLACEMENT Bilateral R-9/04, L- 8/05   Total Knee Arthroplasties.  Marland Kitchen KYPHOPLASTY N/A 04/10/2019   Procedure: KYPHOPLASTY;  Surgeon: Hessie Knows, MD;  Location: [facility] ORS;  Service: Orthopedics;  Laterality: N/A;  . REVERSE SHOULDER ARTHROPLASTY Right 11/26/2013   Procedure: RIGHT REVERSE SHOULDER ARTHROPLASTY;  Surgeon: Marin Shutter, MD;  Location: [facility];  Service: Orthopedics;  Laterality: Right;  . SHOULDER ARTHROSCOPY Right   . THYROIDECTOMY  at age 54  . TONSILLECTOMY      Prior to Admission medications   Medication Sig Start Date End Date Taking? Authorizing Provider  cholecalciferol (VITAMIN D) 1000 units tablet Take 1,000 Units by mouth daily.    [provider]  Multiple Vitamin (MULTIVITAMIN WITH MINERALS) TABS tablet Take 1 tablet by mouth daily.    [provider]  mupirocin ointment (BACTROBAN) 2 % Apply to wound after soaking BID 02/26/18   Hyatt, Max T, DPM  omeprazole (PRILOSEC OTC) 20 MG tablet Take 40 mg by mouth daily.     [provider]  predniSONE (DELTASONE) 20 MG tablet Take 20-40 mg by mouth daily at 6 (six) AM. 04/06/19   [provider]  Probiotic Product (PROBIOTIC DAILY PO) Take 1 tablet by mouth daily at 6 (six) AM.     [provider]  traMADol (ULTRAM) 50 MG tablet Take 1 tablet (50 mg total) by mouth every  6 (six) hours as needed. 10/17/19   Caryn Section Linden Dolin, PA-C  trolamine salicylate (ASPERCREME) 10 % cream Apply 1 application topically as needed for muscle pain.    [provider]    Allergies Sulfonamide derivatives  Family History  Problem Relation Age of Onset  . Heart attack Mother        died @ 28  . Heart attack Brother        died @ 55  . Heart attack Brother        died @ 45  . Other Father        died suddenly @ 16.    Social History Social History   Tobacco Use  . Smoking status: Never Smoker  . Smokeless tobacco: Never Used  Substance Use Topics    . Alcohol use: No  . Drug use: No    Review of Systems  Constitutional: No fever/chills Eyes: No visual changes. ENT: No sore throat. Respiratory: Denies cough Cardiovascular: Denies chest pain Genitourinary: Negative for dysuria. Musculoskeletal: Negative for back pain.  Positive for right shoulder pain Skin: Negative for rash. Psychiatric: no mood changes,     ____________________________________________   PHYSICAL EXAM:  VITAL SIGNS: ED Triage Vitals  Enc Vitals Group     BP 10/17/19 0932 129/60     Pulse Rate 10/17/19 0932 84     Resp 10/17/19 0932 16     Temp 10/17/19 0932 98.2 F (36.8 C)     Temp Source 10/17/19 0932 Oral     SpO2 10/17/19 0932 96 %     Weight 10/17/19 0931 111 lb (50.3 kg)     Height 10/17/19 0931 5\' 2"  (1.575 m)     Head Circumference --      Peak Flow --      Pain Score 10/17/19 0930 6     Pain Loc --      Pain Edu? --      Excl. in [place]? --     Constitutional: Alert and oriented. Well appearing and in no acute distress. Eyes: Conjunctivae are normal.  Head: Atraumatic. Nose: No congestion/rhinnorhea. Mouth/Throat: Mucous membranes are moist.   Neck:  supple no lymphadenopathy noted Cardiovascular: Normal rate, regular rhythm.  Respiratory: Normal respiratory effort.  No retractions,  GU: deferred Musculoskeletal:decreased rom of right shoulder, tender at upper humerus, n/v intact Neurologic:  Normal speech and language.  Skin:  Skin is warm, dry and intact. No rash noted. Psychiatric: Mood and affect are normal. Speech and behavior are normal.  ____________________________________________   LABS (all labs ordered are listed, but only abnormal results are displayed)  Labs Reviewed - No data to display ____________________________________________   ____________________________________________  RADIOLOGY  Xray Right humerus is negative  ____________________________________________   PROCEDURES  Procedure(s)  performed: No  Procedures    ____________________________________________   INITIAL IMPRESSION / ASSESSMENT AND PLAN / ED COURSE  Pertinent labs & imaging results that were available during my care of the patient were reviewed by me and considered in my medical decision making (see chart for details).   Patient is a 86 year old female presents emergency department with mechanical type pain of the right shoulder.  See HPI physical exam shows her right shoulder to be tender to palpation of the upper humerus.  She is does have full range of motion although does reproduce pain.  Neurovascular is intact  X-ray of the right humerus is negative for any fractures. I did explain findings to the patient and her  husband.  Feel is more of an inflammation of the shoulder. She is to continue to take Tylenol and I did give her a prescription for tramadol for pain not controlled by Tylenol.  They are to follow-up with orthopedics as needed.  Return emergency department if worsening.  Apply ice or heat whichever helps more to the right shoulder.     Melissa [name] was evaluated in Emergency Department on 10/17/2019 for the symptoms described in the history of present illness. She was evaluated in the context of the global COVID-19 pandemic, which necessitated consideration that the patient might be at risk for infection with the SARS-CoV-2 virus that causes COVID-19. Institutional protocols and algorithms that pertain to the evaluation of patients at risk for COVID-19 are in a state of rapid change based on information released by regulatory bodies including the CDC and federal and state organizations. These policies and algorithms were followed during the patient's care in the ED.    As part of my medical decision making, I reviewed the following data within the [place] History obtained from family, Nursing notes reviewed and incorporated, Old chart reviewed, Radiograph reviewed , Notes  from prior ED visits and [place] Controlled Substance Database  ____________________________________________   FINAL CLINICAL IMPRESSION(S) / ED DIAGNOSES  Final diagnoses:  Acute pain of right shoulder      NEW MEDICATIONS STARTED DURING THIS VISIT:  Current Discharge Medication List    START taking these medications   Details  traMADol (ULTRAM) 50 MG tablet Take 1 tablet (50 mg total) by mouth every 6 (six) hours as needed. Qty: 15 tablet, Refills: 0         Note:  This document was prepared using Dragon voice recognition software and may include unintentional dictation errors.    Versie Starks, PA-C 10/17/19 1155    Merlyn Lot, MD 10/17/19 1444

## 2019-10-21 DIAGNOSIS — Z23 Encounter for immunization: Secondary | ICD-10-CM | POA: Diagnosis not present

## 2019-10-21 DIAGNOSIS — M25511 Pain in right shoulder: Secondary | ICD-10-CM | POA: Diagnosis not present

## 2019-10-22 DIAGNOSIS — Z9889 Other specified postprocedural states: Secondary | ICD-10-CM | POA: Insufficient documentation

## 2019-10-22 DIAGNOSIS — Z96611 Presence of right artificial shoulder joint: Secondary | ICD-10-CM | POA: Insufficient documentation

## 2019-10-23 DIAGNOSIS — Z471 Aftercare following joint replacement surgery: Secondary | ICD-10-CM | POA: Diagnosis not present

## 2019-10-23 DIAGNOSIS — Z96611 Presence of right artificial shoulder joint: Secondary | ICD-10-CM | POA: Diagnosis not present

## 2019-12-08 DIAGNOSIS — Z23 Encounter for immunization: Secondary | ICD-10-CM | POA: Diagnosis not present

## 2019-12-11 DIAGNOSIS — M5441 Lumbago with sciatica, right side: Secondary | ICD-10-CM | POA: Diagnosis not present

## 2019-12-11 DIAGNOSIS — M810 Age-related osteoporosis without current pathological fracture: Secondary | ICD-10-CM | POA: Diagnosis not present

## 2019-12-11 DIAGNOSIS — L821 Other seborrheic keratosis: Secondary | ICD-10-CM | POA: Diagnosis not present

## 2019-12-11 DIAGNOSIS — L600 Ingrowing nail: Secondary | ICD-10-CM | POA: Diagnosis not present

## 2020-01-05 DIAGNOSIS — Z23 Encounter for immunization: Secondary | ICD-10-CM | POA: Diagnosis not present

## 2020-06-14 DIAGNOSIS — Z1389 Encounter for screening for other disorder: Secondary | ICD-10-CM | POA: Diagnosis not present

## 2020-06-14 DIAGNOSIS — Z79899 Other long term (current) drug therapy: Secondary | ICD-10-CM | POA: Diagnosis not present

## 2020-06-14 DIAGNOSIS — I351 Nonrheumatic aortic (valve) insufficiency: Secondary | ICD-10-CM | POA: Diagnosis not present

## 2020-06-14 DIAGNOSIS — M810 Age-related osteoporosis without current pathological fracture: Secondary | ICD-10-CM | POA: Diagnosis not present

## 2020-06-14 DIAGNOSIS — I700 Atherosclerosis of aorta: Secondary | ICD-10-CM | POA: Diagnosis not present

## 2020-06-14 DIAGNOSIS — K219 Gastro-esophageal reflux disease without esophagitis: Secondary | ICD-10-CM | POA: Diagnosis not present

## 2020-06-14 DIAGNOSIS — Z0000 Encounter for general adult medical examination without abnormal findings: Secondary | ICD-10-CM | POA: Diagnosis not present

## 2020-06-14 DIAGNOSIS — K9049 Malabsorption due to intolerance, not elsewhere classified: Secondary | ICD-10-CM | POA: Diagnosis not present

## 2020-07-07 DIAGNOSIS — Z23 Encounter for immunization: Secondary | ICD-10-CM | POA: Diagnosis not present

## 2020-07-14 ENCOUNTER — Other Ambulatory Visit: Payer: Self-pay

## 2020-07-14 ENCOUNTER — Inpatient Hospital Stay
Admission: EM | Admit: 2020-07-14 | Discharge: 2020-07-19 | DRG: 481 | Disposition: A | Discharge status: Skilled Nursing Facility | Payer: Medicare Other | Attending: Internal Medicine | Admitting: Internal Medicine

## 2020-07-14 ENCOUNTER — Emergency Department: Payer: Medicare Other

## 2020-07-14 DIAGNOSIS — I517 Cardiomegaly: Secondary | ICD-10-CM | POA: Diagnosis not present

## 2020-07-14 DIAGNOSIS — F0390 Unspecified dementia without behavioral disturbance: Secondary | ICD-10-CM | POA: Diagnosis not present

## 2020-07-14 DIAGNOSIS — K219 Gastro-esophageal reflux disease without esophagitis: Secondary | ICD-10-CM | POA: Diagnosis present

## 2020-07-14 DIAGNOSIS — R0902 Hypoxemia: Secondary | ICD-10-CM | POA: Diagnosis present

## 2020-07-14 DIAGNOSIS — D509 Iron deficiency anemia, unspecified: Secondary | ICD-10-CM | POA: Diagnosis present

## 2020-07-14 DIAGNOSIS — I10 Essential (primary) hypertension: Secondary | ICD-10-CM | POA: Diagnosis not present

## 2020-07-14 DIAGNOSIS — S72141D Displaced intertrochanteric fracture of right femur, subsequent encounter for closed fracture with routine healing: Secondary | ICD-10-CM | POA: Diagnosis not present

## 2020-07-14 DIAGNOSIS — I361 Nonrheumatic tricuspid (valve) insufficiency: Secondary | ICD-10-CM | POA: Diagnosis not present

## 2020-07-14 DIAGNOSIS — R52 Pain, unspecified: Secondary | ICD-10-CM | POA: Diagnosis not present

## 2020-07-14 DIAGNOSIS — S72009A Fracture of unspecified part of neck of unspecified femur, initial encounter for closed fracture: Secondary | ICD-10-CM | POA: Diagnosis not present

## 2020-07-14 DIAGNOSIS — Z20822 Contact with and (suspected) exposure to covid-19: Secondary | ICD-10-CM | POA: Diagnosis present

## 2020-07-14 DIAGNOSIS — Z66 Do not resuscitate: Secondary | ICD-10-CM | POA: Diagnosis present

## 2020-07-14 DIAGNOSIS — R011 Cardiac murmur, unspecified: Secondary | ICD-10-CM | POA: Diagnosis present

## 2020-07-14 DIAGNOSIS — M25551 Pain in right hip: Secondary | ICD-10-CM

## 2020-07-14 DIAGNOSIS — Z7952 Long term (current) use of systemic steroids: Secondary | ICD-10-CM | POA: Diagnosis not present

## 2020-07-14 DIAGNOSIS — Z79899 Other long term (current) drug therapy: Secondary | ICD-10-CM | POA: Diagnosis not present

## 2020-07-14 DIAGNOSIS — R0689 Other abnormalities of breathing: Secondary | ICD-10-CM | POA: Diagnosis not present

## 2020-07-14 DIAGNOSIS — W19XXXA Unspecified fall, initial encounter: Secondary | ICD-10-CM | POA: Diagnosis not present

## 2020-07-14 DIAGNOSIS — S7291XA Unspecified fracture of right femur, initial encounter for closed fracture: Secondary | ICD-10-CM

## 2020-07-14 DIAGNOSIS — D508 Other iron deficiency anemias: Secondary | ICD-10-CM | POA: Diagnosis not present

## 2020-07-14 DIAGNOSIS — R278 Other lack of coordination: Secondary | ICD-10-CM | POA: Diagnosis not present

## 2020-07-14 DIAGNOSIS — I340 Nonrheumatic mitral (valve) insufficiency: Secondary | ICD-10-CM | POA: Diagnosis not present

## 2020-07-14 DIAGNOSIS — Z4789 Encounter for other orthopedic aftercare: Secondary | ICD-10-CM | POA: Diagnosis not present

## 2020-07-14 DIAGNOSIS — S72141A Displaced intertrochanteric fracture of right femur, initial encounter for closed fracture: Principal | ICD-10-CM

## 2020-07-14 DIAGNOSIS — I350 Nonrheumatic aortic (valve) stenosis: Secondary | ICD-10-CM | POA: Diagnosis not present

## 2020-07-14 DIAGNOSIS — E890 Postprocedural hypothyroidism: Secondary | ICD-10-CM | POA: Diagnosis present

## 2020-07-14 DIAGNOSIS — R9431 Abnormal electrocardiogram [ECG] [EKG]: Secondary | ICD-10-CM | POA: Diagnosis not present

## 2020-07-14 DIAGNOSIS — Z741 Need for assistance with personal care: Secondary | ICD-10-CM | POA: Diagnosis not present

## 2020-07-14 DIAGNOSIS — I700 Atherosclerosis of aorta: Secondary | ICD-10-CM | POA: Insufficient documentation

## 2020-07-14 DIAGNOSIS — R262 Difficulty in walking, not elsewhere classified: Secondary | ICD-10-CM | POA: Diagnosis not present

## 2020-07-14 DIAGNOSIS — M1990 Unspecified osteoarthritis, unspecified site: Secondary | ICD-10-CM | POA: Diagnosis present

## 2020-07-14 DIAGNOSIS — R4189 Other symptoms and signs involving cognitive functions and awareness: Secondary | ICD-10-CM | POA: Diagnosis not present

## 2020-07-14 DIAGNOSIS — W07XXXA Fall from chair, initial encounter: Secondary | ICD-10-CM | POA: Diagnosis not present

## 2020-07-14 DIAGNOSIS — M6281 Muscle weakness (generalized): Secondary | ICD-10-CM | POA: Diagnosis not present

## 2020-07-14 DIAGNOSIS — D62 Acute posthemorrhagic anemia: Secondary | ICD-10-CM | POA: Diagnosis not present

## 2020-07-14 DIAGNOSIS — I351 Nonrheumatic aortic (valve) insufficiency: Secondary | ICD-10-CM | POA: Diagnosis not present

## 2020-07-14 DIAGNOSIS — Z419 Encounter for procedure for purposes other than remedying health state, unspecified: Secondary | ICD-10-CM

## 2020-07-14 LAB — CBC WITH DIFFERENTIAL/PLATELET
Abs Immature Granulocytes: 0.06 10*3/uL (ref 0.00–0.07)
Basophils Absolute: 0.1 10*3/uL (ref 0.0–0.1)
Basophils Relative: 1 %
Eosinophils Absolute: 0.1 10*3/uL (ref 0.0–0.5)
Eosinophils Relative: 1 %
HCT: 27 % — ABNORMAL LOW (ref 36.0–46.0)
Hemoglobin: 9.2 g/dL — ABNORMAL LOW (ref 12.0–15.0)
Immature Granulocytes: 1 %
Lymphocytes Relative: 27 %
Lymphs Abs: 3.4 10*3/uL (ref 0.7–4.0)
MCH: 33.5 pg (ref 26.0–34.0)
MCHC: 34.1 g/dL (ref 30.0–36.0)
MCV: 98.2 fL (ref 80.0–100.0)
Monocytes Absolute: 1.2 10*3/uL — ABNORMAL HIGH (ref 0.1–1.0)
Monocytes Relative: 10 %
Neutro Abs: 7.9 10*3/uL — ABNORMAL HIGH (ref 1.7–7.7)
Neutrophils Relative %: 60 %
Platelets: 371 10*3/uL (ref 150–400)
RBC: 2.75 MIL/uL — ABNORMAL LOW (ref 3.87–5.11)
RDW: 18.7 % — ABNORMAL HIGH (ref 11.5–15.5)
WBC: 12.7 10*3/uL — ABNORMAL HIGH (ref 4.0–10.5)
nRBC: 0 % (ref 0.0–0.2)

## 2020-07-14 LAB — BRAIN NATRIURETIC PEPTIDE: B Natriuretic Peptide: 86 pg/mL (ref 0.0–100.0)

## 2020-07-14 LAB — PROTIME-INR
INR: 1.1 (ref 0.8–1.2)
Prothrombin Time: 13.8 seconds (ref 11.4–15.2)

## 2020-07-14 LAB — COMPREHENSIVE METABOLIC PANEL
ALT: 23 U/L (ref 0–44)
AST: 33 U/L (ref 15–41)
Albumin: 4.1 g/dL (ref 3.5–5.0)
Alkaline Phosphatase: 64 U/L (ref 38–126)
Anion gap: 11 (ref 5–15)
BUN: 26 mg/dL — ABNORMAL HIGH (ref 8–23)
CO2: 23 mmol/L (ref 22–32)
Calcium: 8.8 mg/dL — ABNORMAL LOW (ref 8.9–10.3)
Chloride: 103 mmol/L (ref 98–111)
Creatinine, Ser: 0.71 mg/dL (ref 0.44–1.00)
GFR, Estimated: >60 mL/min (ref >60)
Glucose, Bld: 148 mg/dL — ABNORMAL HIGH (ref 70–99)
Potassium: 4.3 mmol/L (ref 3.5–5.1)
Sodium: 137 mmol/L (ref 135–145)
Total Bilirubin: 1.1 mg/dL (ref 0.3–1.2)
Total Protein: 6.7 g/dL (ref 6.5–8.1)

## 2020-07-14 LAB — RESP PANEL BY RT-PCR (FLU A&B, COVID) ARPGX2
Influenza A by PCR: NEGATIVE
Influenza B by PCR: NEGATIVE
SARS Coronavirus 2 by RT PCR: NEGATIVE

## 2020-07-14 LAB — TROPONIN I (HIGH SENSITIVITY): Troponin I (High Sensitivity): 13 ng/L (ref <18)

## 2020-07-14 MED ORDER — HYDROCODONE-ACETAMINOPHEN 5-325 MG PO TABS
1.0000 | ORAL_TABLET | Freq: Four times a day (QID) | ORAL | Status: DC | PRN
  Administered 2020-07-14: 2 via ORAL
  Administered 2020-07-15: 1 via ORAL
  Filled 2020-07-14: qty 2
  Filled 2020-07-14: qty 1

## 2020-07-14 MED ORDER — ZOLPIDEM TARTRATE 5 MG PO TABS
5.0000 mg | ORAL_TABLET | Freq: Every evening | ORAL | Status: DC | PRN
  Administered 2020-07-17 – 2020-07-18 (×2): 5 mg via ORAL
  Filled 2020-07-14 (×2): qty 1

## 2020-07-14 MED ORDER — MORPHINE SULFATE (PF) 2 MG/ML IV SOLN
0.5000 mg | INTRAVENOUS | Status: DC | PRN
  Administered 2020-07-14 – 2020-07-15 (×3): 0.5 mg via INTRAVENOUS
  Filled 2020-07-14 (×3): qty 1

## 2020-07-14 MED ORDER — CEFAZOLIN SODIUM-DEXTROSE 2-4 GM/100ML-% IV SOLN
2.0000 g | INTRAVENOUS | Status: AC
  Administered 2020-07-15: 2 g via INTRAVENOUS
  Filled 2020-07-14: qty 100

## 2020-07-14 MED ORDER — SENNOSIDES-DOCUSATE SODIUM 8.6-50 MG PO TABS: 1.0000 | ORAL_TABLET | Freq: Every evening | ORAL | Status: DC | PRN

## 2020-07-14 MED ORDER — SODIUM CHLORIDE 0.9 % IV SOLN
6.2500 mg | Freq: Once | INTRAVENOUS | Status: DC | PRN
  Filled 2020-07-14: qty 0.25

## 2020-07-14 MED ORDER — FENTANYL CITRATE (PF) 100 MCG/2ML IJ SOLN
50.0000 ug | INTRAMUSCULAR | Status: DC | PRN
  Administered 2020-07-14 (×3): 50 ug via INTRAVENOUS
  Filled 2020-07-14 (×3): qty 2

## 2020-07-14 MED ORDER — ONDANSETRON HCL 4 MG/2ML IJ SOLN
4.0000 mg | Freq: Four times a day (QID) | INTRAMUSCULAR | Status: DC | PRN
  Administered 2020-07-14: 4 mg via INTRAVENOUS
  Filled 2020-07-14: qty 2

## 2020-07-14 MED ORDER — BISACODYL 5 MG PO TBEC: 5.0000 mg | DELAYED_RELEASE_TABLET | Freq: Every day | ORAL | Status: DC | PRN

## 2020-07-14 NOTE — H&P (Signed)
History and Physical    Melissa [name]:[id number] DOB: [birth year] DOA: 07/14/2020  PCP: [name], MD   Patient coming from: Home  I have personally briefly reviewed patient's old medical records in [place]  Chief Complaint: Fall onto right hip  HPI: Melissa [name] is a 86 y.o. female with medical history significant for Iron deficiency anemia who presents to the ED following an accidental fall off rolling computer chair onto her right hip.  Did not hit her head was unable to ambulate after the event.  She was previously in her usual state of health and denied any recent fever, cough, shortness of breath, nausea, vomiting or change in bowel habits or abdominal pain or dysuria ED course: On arrival, afebrile, BP 144/61, pulse 71 with O2 sat 93% on room air.  Blood work significant for leukocytosis of 12,700, hemoglobin 9.2, baseline 9-10.  Troponin of 13 BNP 86. EKG, personally viewed and interpreted sinus rhythm at 71 with no acute ST-T wave changes Imaging: Chest x-ray with hazy right lung base opacity which may be due to atelectasis or mild pneumonia and mild cardiomegaly Right hip x-ray with acute comminuted and displaced right intertrochanteric fracture  The ED provider spoke with Dr. Mack Guise who will take patient to the OR around noon on 5/27.  Hospitalist consulted for admission.  Review of Systems: As per HPI otherwise all other systems on review of systems negative.    Past Medical History:  Diagnosis Date  . Arthritis   . Chest pain    a. 05/2015 - post-prandial.  . GERD (gastroesophageal reflux disease)    a. takes Omeprazole daily, prev seen by D. Brodie.  Marland Kitchen Heart murmur    a. 05/2013 Echo: EF 55-60%, gr2 DD, mild AI/MR.  Marland Kitchen History of blood transfusion    no abnormal reaction noted   . History of migraine headaches    a. last 10/2013  . Iron deficiency anemia   . Joint pain   . PONV (postoperative nausea and vomiting)   . Rectocele 2011    Past  Surgical History:  Procedure Laterality Date  . cataract surgery  Bilateral    R 03/2001; L 05/2001.  Marland Kitchen COLONOSCOPY    . HYSTEROSCOPY  11/1999   w/ resection of endometrial polyp by uterine curetting.  Marland Kitchen JOINT REPLACEMENT Bilateral R-9/04, L- 8/05   Total Knee Arthroplasties.  Marland Kitchen KYPHOPLASTY N/A 04/10/2019   Procedure: KYPHOPLASTY;  Surgeon: Hessie Knows, MD;  Location: [facility] ORS;  Service: Orthopedics;  Laterality: N/A;  . REVERSE SHOULDER ARTHROPLASTY Right 11/26/2013   Procedure: RIGHT REVERSE SHOULDER ARTHROPLASTY;  Surgeon: Marin Shutter, MD;  Location: [facility];  Service: Orthopedics;  Laterality: Right;  . SHOULDER ARTHROSCOPY Right   . THYROIDECTOMY  at age 30  . TONSILLECTOMY       reports that she has never smoked. She has never used smokeless tobacco. She reports that she does not drink alcohol and does not use drugs.  Allergies  Allergen Reactions  . Sulfonamide Derivatives Hives    Family History  Problem Relation Age of Onset  . Heart attack Mother        died @ 93  . Heart attack Brother        died @ 13  . Heart attack Brother        died @ 87  . Other Father        died suddenly @ 53.      Prior to  Admission medications   Medication Sig Start Date End Date Taking? Authorizing Provider  cholecalciferol (VITAMIN D) 1000 units tablet Take 1,000 Units by mouth daily.    [provider]  Multiple Vitamin (MULTIVITAMIN WITH MINERALS) TABS tablet Take 1 tablet by mouth daily.    [provider]  mupirocin ointment (BACTROBAN) 2 % Apply to wound after soaking BID 02/26/18   Hyatt, Max T, DPM  omeprazole (PRILOSEC OTC) 20 MG tablet Take 40 mg by mouth daily.     [provider]  predniSONE (DELTASONE) 20 MG tablet Take 20-40 mg by mouth daily at 6 (six) AM. 04/06/19   [provider]  Probiotic Product (PROBIOTIC DAILY PO) Take 1 tablet by mouth daily at 6 (six) AM.     [provider]  traMADol (ULTRAM) 50 MG tablet Take 1  tablet (50 mg total) by mouth every 6 (six) hours as needed. 10/17/19   Caryn Section Linden Dolin, PA-C  trolamine salicylate (ASPERCREME) 10 % cream Apply 1 application topically as needed for muscle pain.    [provider]    Physical Exam: Vitals:   07/14/20 1855 07/14/20 1856  BP: (!) 144/61   Pulse: 71   Resp: 18   Temp: 97.6 F (36.4 C)   TempSrc: Oral   SpO2: 93%   Weight:  49.9 kg  Height:  5\' 4"  (1.626 m)     Vitals:   07/14/20 1855 07/14/20 1856  BP: (!) 144/61   Pulse: 71   Resp: 18   Temp: 97.6 F (36.4 C)   TempSrc: Oral   SpO2: 93%   Weight:  49.9 kg  Height:  5\' 4"  (1.626 m)      Constitutional: Alert and oriented x 3 . Not in any apparent distress HEENT:      Head: Normocephalic and atraumatic.         Eyes: PERLA, EOMI, Conjunctivae are normal. Sclera is non-icteric.       Mouth/Throat: Mucous membranes are moist.       Neck: Supple with no signs of meningismus. Cardiovascular: Regular rate and rhythm. 3/6 murmurs, gallops, or rubs. 2+ symmetrical distal pulses are present . No JVD. No LE edema Respiratory: Respiratory effort normal .Lungs sounds clear bilaterally. No wheezes, crackles, or rhonchi.  Gastrointestinal: Soft, non tender, and non distended with positive bowel sounds.  Genitourinary: No CVA tenderness. Musculoskeletal:  Tender right hip. No cyanosis, or erythema of extremities. Neurologic:  Face is symmetric. Moving all extremities. No gross focal neurologic deficits . Skin: Skin is warm, dry.  No rash or ulcers Psychiatric: Mood and affect are normal    Labs on Admission: I have personally reviewed following labs and imaging studies  CBC: Recent Labs  Lab 07/14/20 1919  WBC 12.7*  NEUTROABS 7.9*  HGB 9.2*  HCT 27.0*  MCV 98.2  PLT 630   Basic Metabolic Panel: Recent Labs  Lab 07/14/20 1919  NA 137  K 4.3  CL 103  CO2 23  GLUCOSE 148*  BUN 26*  CREATININE 0.71  CALCIUM 8.8*   GFR: Estimated Creatinine Clearance:  37.6 mL/min (by C-G formula based on SCr of 0.71 mg/dL). Liver Function Tests: Recent Labs  Lab 07/14/20 1919  AST 33  ALT 23  ALKPHOS 64  BILITOT 1.1  PROT 6.7  ALBUMIN 4.1   No results for input(s): LIPASE, AMYLASE in the last 168 hours. No results for input(s): AMMONIA in the last 168 hours. Coagulation Profile: Recent Labs  Lab  07/14/20 1919  INR 1.1   Cardiac Enzymes: No results for input(s): CKTOTAL, CKMB, CKMBINDEX, TROPONINI in the last 168 hours. BNP (last 3 results) No results for input(s): PROBNP in the last 8760 hours. HbA1C: No results for input(s): HGBA1C in the last 72 hours. CBG: No results for input(s): GLUCAP in the last 168 hours. Lipid Profile: No results for input(s): CHOL, HDL, LDLCALC, TRIG, CHOLHDL, LDLDIRECT in the last 72 hours. Thyroid Function Tests: No results for input(s): TSH, T4TOTAL, FREET4, T3FREE, THYROIDAB in the last 72 hours. Anemia Panel: No results for input(s): VITAMINB12, FOLATE, FERRITIN, TIBC, IRON, RETICCTPCT in the last 72 hours. Urine analysis:    Component Value Date/Time   COLORURINE YELLOW (A) 04/09/2019 1536   APPEARANCEUR CLEAR (A) 04/09/2019 1536   LABSPEC 1.015 04/09/2019 1536   PHURINE 6.0 04/09/2019 1536   GLUCOSEU NEGATIVE 04/09/2019 1536   HGBUR NEGATIVE 04/09/2019 1536   BILIRUBINUR NEGATIVE 04/09/2019 1536   KETONESUR NEGATIVE 04/09/2019 1536   PROTEINUR NEGATIVE 04/09/2019 1536   NITRITE NEGATIVE 04/09/2019 1536   LEUKOCYTESUR NEGATIVE 04/09/2019 1536    Radiological Exams on Admission: DG Chest 1 View  Result Date: 07/14/2020 CLINICAL DATA:  Hip fracture EXAM: CHEST  1 VIEW COMPARISON:  06/25/2017 FINDINGS: Right shoulder replacement. No pleural effusion or pneumothorax. Hazy right lung base opacity. Mild cardiomegaly. No pneumothorax. IMPRESSION: 1. Hazy right lung base opacity which may be due to atelectasis or mild pneumonia 2. Mild cardiomegaly Electronically Signed   By: Donavan Foil M.D.   On:  07/14/2020 19:58   DG Hip Unilat With Pelvis 2-3 Views Right  Result Date: 07/14/2020 CLINICAL DATA:  Fall with deformity EXAM: DG HIP (WITH OR WITHOUT PELVIS) 2-3V RIGHT COMPARISON:  07/31/2013 FINDINGS: Post treatment changes of the sacrum. Pubic symphysis and rami appear intact. Both femoral heads project in joint. Acute comminuted and displaced right intertrochanteric fracture. Mild apex anterior angulation. IMPRESSION: Acute comminuted and displaced right intertrochanteric fracture Electronically Signed   By: Donavan Foil M.D.   On: 07/14/2020 19:57     Assessment/Plan 87 year old female with history of iron deficiency anemia who presenting with right hip pain following an accidental fall off rolling computer chair onto her right hip.   Closed intertrochanteric fracture of hip, right, initial encounter  Accidental fall Preoperative clearance - Patient with no significant past medical history except for chronic iron deficiency anemia presenting with accidental fall onto her right hip sustaining closed intertrochanteric fracture - No past history of CAD, CHF, stroke, cardiac arrhythmia, COPD, OSA, syncopal episodes.  Patient has good exercise tolerance and is functionally independent - Cleared to proceed with planned orthopedic repair on 5/27 at noon - Pain management - Further orders per Dr. Mack Guise who has been consulted from the ER  Hypoxia Possible subclinical pneumonia - Mild hypoxia to 93% on room air - Chest x-ray with possible mild pneumonia and mild leukocytosis to 12,700 - Procalcitonin<0.1 and COVID and flu negative - Supplemental O2 to keep sats over 94%  Heart murmur -- Echo to evaluate valves  Iron deficiency anemia - Hemoglobin 9.7 at baseline    DVT prophylaxis: Lovenox  Code Status:DNR Family Communication:  none  Disposition Plan: Back to previous home environment Consults called: Orthopedics Status:At the time of admission, it appears that the  appropriate admission status for this patient is INPATIENT. This is judged to be reasonable and necessary in order to provide the required intensity of service to ensure the patient's safety given the presenting symptoms, physical exam  findings, and initial radiographic and laboratory data in the context of their  Comorbid conditions.   Patient requires inpatient status due to high intensity of service, high risk for further deterioration and high frequency of surveillance required.   I certify that at the point of admission it is my clinical judgment that the patient will require inpatient hospital care spanning beyond [place] MD [facility]     07/14/2020, 9:24 PM

## 2020-07-14 NOTE — ED Notes (Signed)
Notified by BB that the specimen hemolyzed.  Asked for her to send phlebotomy for stick.

## 2020-07-14 NOTE — Consult Note (Signed)
Full consult note to follow.  Orthopaedics consulted for right intertrochanteric hip fracture s/p fall in an 87 year old female.   Patient fell off a chair at home.  Unable to ambulate after the fall.  Spoke with Dr. Cheri Fowler in the ER regarding this patient.   Patient is reportedly not on baseline anticoagulation.   Plan for surgery tomorrow at noon for right hip fixation pending medical clearance.  Patient will be NPO after midnight.   Patient should NOT receive anticoagulation therapy overnight.

## 2020-07-14 NOTE — ED Provider Notes (Signed)
[facility] Emergency Department Provider Note   ____________________________________________   Event Date/Time   First MD Initiated Contact with Patient 07/14/20 1910     (approximate)  I have reviewed the triage vital signs and the nursing notes.   HISTORY  Chief Complaint Hip Pain    HPI Melissa [name] is a 87 y.o. female with the below stated past medical history presents via EMS from home after falling out of a rolling chair onto her right side and having significant, 10/10, sharp, nonradiating right upper leg and hip pain that is worse with any movement and partially relieved with 50 mcg of fentanyl that was given in route by EMS.  Patient denies any loss of consciousness or head trauma.  Patient denies any blood thinners or antiplatelet use.  Patient currently denies any vision changes, tinnitus, difficulty speaking, facial droop, sore throat, chest pain, shortness of breath, abdominal pain, nausea/vomiting/diarrhea, dysuria, or weakness/numbness/paresthesias in any extremity         Past Medical History:  Diagnosis Date  . Arthritis   . Chest pain    a. 05/2015 - post-prandial.  . GERD (gastroesophageal reflux disease)    a. takes Omeprazole daily, prev seen by D. Brodie.  Marland Kitchen Heart murmur    a. 05/2013 Echo: EF 55-60%, gr2 DD, mild AI/MR.  Marland Kitchen History of blood transfusion    no abnormal reaction noted   . History of migraine headaches    a. last 10/2013  . Iron deficiency anemia   . Joint pain   . PONV (postoperative nausea and vomiting)   . Rectocele 2011    Patient Active Problem List   Diagnosis Date Noted  . Compression fx, lumbar spine ([place]) 04/11/2019  . Intractable low back pain 04/09/2019  . Compression fracture of lumbosacral vertebra, nontraumatic, initial encounter with retropulsion 04/09/2019  . Sacral insufficiency fracture 04/09/2019  . AI (aortic incompetence) 12/30/2017  . OP (osteoporosis) 12/30/2017  . S/P shoulder  replacement 11/26/2013  . ANEMIA, IRON DEFICIENCY 04/05/2009  . HEMORRHOIDS, INTERNAL 04/05/2009  . GERD 04/05/2009  . CONSTIPATION 04/05/2009  . ARTHRITIS 04/05/2009    Past Surgical History:  Procedure Laterality Date  . cataract surgery  Bilateral    R 03/2001; L 05/2001.  Marland Kitchen COLONOSCOPY    . HYSTEROSCOPY  11/1999   w/ resection of endometrial polyp by uterine curetting.  Marland Kitchen JOINT REPLACEMENT Bilateral R-9/04, L- 8/05   Total Knee Arthroplasties.  Marland Kitchen KYPHOPLASTY N/A 04/10/2019   Procedure: KYPHOPLASTY;  Surgeon: Hessie Knows, MD;  Location: [facility] ORS;  Service: Orthopedics;  Laterality: N/A;  . REVERSE SHOULDER ARTHROPLASTY Right 11/26/2013   Procedure: RIGHT REVERSE SHOULDER ARTHROPLASTY;  Surgeon: Marin Shutter, MD;  Location: [facility];  Service: Orthopedics;  Laterality: Right;  . SHOULDER ARTHROSCOPY Right   . THYROIDECTOMY  at age 87  . TONSILLECTOMY      Prior to Admission medications   Medication Sig Start Date End Date Taking? Authorizing Provider  cholecalciferol (VITAMIN D) 1000 units tablet Take 1,000 Units by mouth daily.    [provider]  Multiple Vitamin (MULTIVITAMIN WITH MINERALS) TABS tablet Take 1 tablet by mouth daily.    [provider]  mupirocin ointment (BACTROBAN) 2 % Apply to wound after soaking BID 02/26/18   Hyatt, Max T, DPM  omeprazole (PRILOSEC OTC) 20 MG tablet Take 40 mg by mouth daily.     [provider]  predniSONE (DELTASONE) 20 MG tablet Take 20-40 mg by mouth  daily at 6 (six) AM. 04/06/19   [provider]  Probiotic Product (PROBIOTIC DAILY PO) Take 1 tablet by mouth daily at 6 (six) AM.     [provider]  traMADol (ULTRAM) 50 MG tablet Take 1 tablet (50 mg total) by mouth every 6 (six) hours as needed. 10/17/19   Caryn Section Linden Dolin, PA-C  trolamine salicylate (ASPERCREME) 10 % cream Apply 1 application topically as needed for muscle pain.    [provider]    Allergies Sulfonamide  derivatives  Family History  Problem Relation Age of Onset  . Heart attack Mother        died @ 53  . Heart attack Brother        died @ 46  . Heart attack Brother        died @ 76  . Other Father        died suddenly @ 88.    Social History Social History   Tobacco Use  . Smoking status: Never Smoker  . Smokeless tobacco: Never Used  Substance Use Topics  . Alcohol use: No  . Drug use: No    Review of Systems Constitutional: No fever/chills Eyes: No visual changes. ENT: No sore throat. Cardiovascular: Denies chest pain. Respiratory: Denies shortness of breath. Gastrointestinal: No abdominal pain.  No nausea, no vomiting.  No diarrhea. Genitourinary: Negative for dysuria. Musculoskeletal: Significant for right lower extremity hip pain Skin: Negative for rash. Neurological: Negative for headaches, weakness/numbness/paresthesias in any extremity Psychiatric: Negative for suicidal ideation/homicidal ideation   ____________________________________________   PHYSICAL EXAM:  VITAL SIGNS: ED Triage Vitals  Enc Vitals Group     BP 07/14/20 1855 (!) 144/61     Pulse Rate 07/14/20 1855 71     Resp 07/14/20 1855 18     Temp 07/14/20 1855 97.6 F (36.4 C)     Temp Source 07/14/20 1855 Oral     SpO2 07/14/20 1855 93 %     Weight 07/14/20 1856 110 lb (49.9 kg)     Height 07/14/20 1856 5\' 4"  (1.626 m)     Head Circumference --      Peak Flow --      Pain Score 07/14/20 1856 9     Pain Loc --      Pain Edu? --      Excl. in [place]? --    Constitutional: Alert and oriented. Well appearing and in no acute distress. Eyes: Conjunctivae are normal. PERRL. Head: Atraumatic. Nose: No congestion/rhinnorhea. Mouth/Throat: Mucous membranes are moist. Neck: No stridor Cardiovascular: Grossly normal heart sounds.  Good peripheral circulation. Respiratory: Normal respiratory effort.  No retractions. Gastrointestinal: Soft and nontender. No distention. Musculoskeletal: Right  lower extremity shortened and externally rotated with significant tenderness to palpation over right upper anterior thigh and right lateral hip.  Distally neurovascularly intact Neurologic:  Normal speech and language. No gross focal neurologic deficits are appreciated. Skin:  Skin is warm and dry. No rash noted. Psychiatric: Mood and affect are normal. Speech and behavior are normal.  ____________________________________________   LABS (all labs ordered are listed, but only abnormal results are displayed)  Labs Reviewed  COMPREHENSIVE METABOLIC PANEL - Abnormal; Notable for the following components:      Result Value   Glucose, Bld 148 (*)    BUN 26 (*)    Calcium 8.8 (*)    All other components within normal limits  CBC WITH DIFFERENTIAL/PLATELET - Abnormal; Notable for the following components:   WBC  12.7 (*)    RBC 2.75 (*)    Hemoglobin 9.2 (*)    HCT 27.0 (*)    RDW 18.7 (*)    Neutro Abs 7.9 (*)    Monocytes Absolute 1.2 (*)    All other components within normal limits  RESP PANEL BY RT-PCR (FLU A&B, COVID) ARPGX2  BRAIN NATRIURETIC PEPTIDE  PROTIME-INR  TYPE AND SCREEN  TROPONIN I (HIGH SENSITIVITY)  TROPONIN I (HIGH SENSITIVITY)   ____________________________________________  EKG  ED ECG REPORT I, Naaman Plummer, the attending physician, personally viewed and interpreted this ECG.  Date: 07/14/2020 EKG Time: 1948 Rate: 71 Rhythm: normal sinus rhythm QRS Axis: normal Intervals: normal ST/T Wave abnormalities: normal Narrative Interpretation: no evidence of acute ischemia  ____________________________________________  RADIOLOGY  ED MD interpretation: Single view chest x-ray shows hazy right lung base opacity which may be atelectasis versus mild pneumonia as well as evidence of mild cardiomegaly  3 view x-ray of the right hip shows acute comminuted and displaced right intertrochanteric fracture  Official radiology report(s): DG Chest 1 View  Result  Date: 07/14/2020 CLINICAL DATA:  Hip fracture EXAM: CHEST  1 VIEW COMPARISON:  06/25/2017 FINDINGS: Right shoulder replacement. No pleural effusion or pneumothorax. Hazy right lung base opacity. Mild cardiomegaly. No pneumothorax. IMPRESSION: 1. Hazy right lung base opacity which may be due to atelectasis or mild pneumonia 2. Mild cardiomegaly Electronically Signed   By: Donavan Foil M.D.   On: 07/14/2020 19:58   DG Hip Unilat With Pelvis 2-3 Views Right  Result Date: 07/14/2020 CLINICAL DATA:  Fall with deformity EXAM: DG HIP (WITH OR WITHOUT PELVIS) 2-3V RIGHT COMPARISON:  07/31/2013 FINDINGS: Post treatment changes of the sacrum. Pubic symphysis and rami appear intact. Both femoral heads project in joint. Acute comminuted and displaced right intertrochanteric fracture. Mild apex anterior angulation. IMPRESSION: Acute comminuted and displaced right intertrochanteric fracture Electronically Signed   By: Donavan Foil M.D.   On: 07/14/2020 19:57    ____________________________________________   PROCEDURES  Procedure(s) performed (including Critical Care):  .1-3 Lead EKG Interpretation Performed by: Naaman Plummer, MD Authorized by: Naaman Plummer, MD     Interpretation: normal     ECG rate:  70   ECG rate assessment: normal     Rhythm: sinus rhythm     Ectopy: none     Conduction: normal       ____________________________________________   INITIAL IMPRESSION / ASSESSMENT AND PLAN / ED COURSE  As part of my medical decision making, I reviewed the following data within the [place] notes reviewed and incorporated, Labs reviewed, EKG interpreted, Old chart reviewed, Radiograph reviewed and Notes from prior ED visits reviewed and incorporated        Patient is a 87 year old female that presents for right hip pain Workup: XR hip Findings: Right femur fracture without dislocation Consult: Orthopedic Surgery (query fascia iliacus block vs continued  opiate pain control), hospitalist  Patient does not currently demonstrate complications of fracture such as compartment syndrome, arterial or nerve injury.  Interventions: analgesia Disposition: Admit      ____________________________________________   FINAL CLINICAL IMPRESSION(S) / ED DIAGNOSES  Final diagnoses:  Closed fracture of right femur, unspecified fracture morphology, initial encounter [facility])  Fall, initial encounter  Right hip pain     ED Discharge Orders    None       Note:  This document was prepared using Dragon voice recognition software and may include unintentional dictation errors.  Naaman Plummer, MD 07/14/20 2039

## 2020-07-14 NOTE — ED Notes (Signed)
Message sent to receiving nurse.

## 2020-07-14 NOTE — ED Triage Notes (Signed)
Pt arrives via EMS from home- pt was in a rolling chair when she fell when trying to get out- pt has obvious deformity to R hip- pt was given 70mcg of fent by EMS- VSS per EMS- pt denies LOC or blood thinners

## 2020-07-14 NOTE — ED Notes (Signed)
Pulse ox along with ETCO2 detector placed on patient.

## 2020-07-15 ENCOUNTER — Encounter
Admission: EM | Disposition: A | Discharge status: Skilled Nursing Facility | Payer: Self-pay | Source: Home / Self Care | Attending: Internal Medicine

## 2020-07-15 ENCOUNTER — Inpatient Hospital Stay: Payer: Medicare Other | Admitting: Anesthesiology

## 2020-07-15 ENCOUNTER — Inpatient Hospital Stay: Payer: Medicare Other

## 2020-07-15 ENCOUNTER — Encounter: Payer: Self-pay | Admitting: Internal Medicine

## 2020-07-15 DIAGNOSIS — S72141A Displaced intertrochanteric fracture of right femur, initial encounter for closed fracture: Principal | ICD-10-CM

## 2020-07-15 HISTORY — PX: INTRAMEDULLARY (IM) NAIL INTERTROCHANTERIC: SHX5875

## 2020-07-15 LAB — TROPONIN I (HIGH SENSITIVITY): Troponin I (High Sensitivity): 13 ng/L (ref <18)

## 2020-07-15 LAB — PROCALCITONIN: Procalcitonin: <0.1 ng/mL

## 2020-07-15 SURGERY — FIXATION, FRACTURE, INTERTROCHANTERIC, WITH INTRAMEDULLARY ROD
Anesthesia: Spinal | Laterality: Right

## 2020-07-15 MED ORDER — PROPOFOL 10 MG/ML IV BOLUS
INTRAVENOUS | Status: DC | PRN
  Administered 2020-07-15 (×2): 30 mg via INTRAVENOUS

## 2020-07-15 MED ORDER — HYDROCODONE-ACETAMINOPHEN 5-325 MG PO TABS
1.0000 | ORAL_TABLET | ORAL | Status: DC | PRN
  Administered 2020-07-16 – 2020-07-17 (×3): 1 via ORAL
  Administered 2020-07-18: 2 via ORAL
  Filled 2020-07-15 (×2): qty 1
  Filled 2020-07-15: qty 2
  Filled 2020-07-15: qty 1

## 2020-07-15 MED ORDER — SODIUM CHLORIDE 0.9 % IV SOLN
INTRAVENOUS | Status: DC | PRN
  Administered 2020-07-15: 50 ug/min via INTRAVENOUS

## 2020-07-15 MED ORDER — MAGNESIUM CITRATE PO SOLN
1.0000 | Freq: Once | ORAL | Status: DC | PRN
  Filled 2020-07-15: qty 296

## 2020-07-15 MED ORDER — TRAMADOL HCL 50 MG PO TABS
50.0000 mg | ORAL_TABLET | Freq: Four times a day (QID) | ORAL | Status: DC
Start: 2020-07-15 — End: 2020-07-19
  Administered 2020-07-15 – 2020-07-18 (×10): 50 mg via ORAL
  Filled 2020-07-15 (×11): qty 1

## 2020-07-15 MED ORDER — FERROUS SULFATE 325 (65 FE) MG PO TABS
325.0000 mg | ORAL_TABLET | Freq: Three times a day (TID) | ORAL | Status: DC
  Administered 2020-07-15 – 2020-07-19 (×11): 325 mg via ORAL
  Filled 2020-07-15 (×11): qty 1

## 2020-07-15 MED ORDER — MORPHINE SULFATE (PF) 2 MG/ML IV SOLN
0.5000 mg | INTRAVENOUS | Status: DC | PRN
  Administered 2020-07-15: 0.5 mg via INTRAVENOUS
  Administered 2020-07-16: 1 mg via INTRAVENOUS
  Filled 2020-07-15 (×3): qty 1

## 2020-07-15 MED ORDER — LACTATED RINGERS IV SOLN: INTRAVENOUS | Status: DC

## 2020-07-15 MED ORDER — CEFAZOLIN SODIUM-DEXTROSE 2-4 GM/100ML-% IV SOLN
2.0000 g | Freq: Four times a day (QID) | INTRAVENOUS | Status: AC
  Administered 2020-07-15 (×2): 2 g via INTRAVENOUS
  Filled 2020-07-15 (×2): qty 100

## 2020-07-15 MED ORDER — SENNA 8.6 MG PO TABS
1.0000 | ORAL_TABLET | Freq: Two times a day (BID) | ORAL | Status: DC
  Administered 2020-07-15 – 2020-07-19 (×8): 8.6 mg via ORAL
  Filled 2020-07-15 (×8): qty 1

## 2020-07-15 MED ORDER — MUPIROCIN 2 % EX OINT: TOPICAL_OINTMENT | Freq: Two times a day (BID) | CUTANEOUS | Status: DC

## 2020-07-15 MED ORDER — HYDROCODONE-ACETAMINOPHEN 7.5-325 MG PO TABS
1.0000 | ORAL_TABLET | ORAL | Status: DC | PRN
  Administered 2020-07-17 – 2020-07-19 (×3): 1 via ORAL
  Filled 2020-07-15 (×3): qty 1

## 2020-07-15 MED ORDER — PROPOFOL 500 MG/50ML IV EMUL
INTRAVENOUS | Status: DC | PRN
  Administered 2020-07-15: 100 ug/kg/min via INTRAVENOUS

## 2020-07-15 MED ORDER — METHOCARBAMOL 1000 MG/10ML IJ SOLN
500.0000 mg | Freq: Four times a day (QID) | INTRAVENOUS | Status: DC | PRN
  Filled 2020-07-15: qty 5

## 2020-07-15 MED ORDER — PROPOFOL 500 MG/50ML IV EMUL
INTRAVENOUS | Status: AC
  Filled 2020-07-15: qty 50

## 2020-07-15 MED ORDER — ONDANSETRON HCL 4 MG/2ML IJ SOLN: 4.0000 mg | Freq: Once | INTRAMUSCULAR | Status: DC | PRN

## 2020-07-15 MED ORDER — PHENOL 1.4 % MT LIQD
1.0000 | OROMUCOSAL | Status: DC | PRN
  Filled 2020-07-15: qty 177

## 2020-07-15 MED ORDER — ONDANSETRON HCL 4 MG/2ML IJ SOLN: 4.0000 mg | Freq: Four times a day (QID) | INTRAMUSCULAR | Status: DC | PRN

## 2020-07-15 MED ORDER — ALUM & MAG HYDROXIDE-SIMETH 200-200-20 MG/5ML PO SUSP: 30.0000 mL | ORAL | Status: DC | PRN

## 2020-07-15 MED ORDER — ADULT MULTIVITAMIN W/MINERALS CH
1.0000 | ORAL_TABLET | Freq: Every day | ORAL | Status: DC
  Administered 2020-07-16 – 2020-07-19 (×4): 1 via ORAL
  Filled 2020-07-15 (×4): qty 1

## 2020-07-15 MED ORDER — GENTAMICIN SULFATE 40 MG/ML IJ SOLN
INTRAMUSCULAR | Status: AC
  Filled 2020-07-15: qty 2

## 2020-07-15 MED ORDER — CHLORHEXIDINE GLUCONATE 0.12 % MT SOLN
OROMUCOSAL | Status: AC
  Filled 2020-07-15: qty 15

## 2020-07-15 MED ORDER — FENTANYL CITRATE (PF) 100 MCG/2ML IJ SOLN
INTRAMUSCULAR | Status: DC | PRN
  Administered 2020-07-15: 25 ug via INTRAVENOUS

## 2020-07-15 MED ORDER — SACCHAROMYCES BOULARDII 250 MG PO CAPS
250.0000 mg | ORAL_CAPSULE | Freq: Every day | ORAL | Status: DC
  Administered 2020-07-16 – 2020-07-19 (×4): 250 mg via ORAL
  Filled 2020-07-15 (×4): qty 1

## 2020-07-15 MED ORDER — ACETAMINOPHEN 325 MG PO TABS
325.0000 mg | ORAL_TABLET | Freq: Four times a day (QID) | ORAL | Status: DC | PRN
  Administered 2020-07-16 – 2020-07-17 (×2): 650 mg via ORAL
  Filled 2020-07-15 (×2): qty 2

## 2020-07-15 MED ORDER — BISACODYL 10 MG RE SUPP
10.0000 mg | Freq: Every day | RECTAL | Status: DC | PRN
  Administered 2020-07-18: 10 mg via RECTAL
  Filled 2020-07-15: qty 1

## 2020-07-15 MED ORDER — LIDOCAINE HCL (CARDIAC) PF 100 MG/5ML IV SOSY
PREFILLED_SYRINGE | INTRAVENOUS | Status: DC | PRN
  Administered 2020-07-15: 50 mg via INTRAVENOUS

## 2020-07-15 MED ORDER — ONDANSETRON HCL 4 MG/2ML IJ SOLN
INTRAMUSCULAR | Status: DC | PRN
  Administered 2020-07-15: 4 mg via INTRAVENOUS

## 2020-07-15 MED ORDER — ONDANSETRON HCL 4 MG PO TABS: 4.0000 mg | ORAL_TABLET | Freq: Four times a day (QID) | ORAL | Status: DC | PRN

## 2020-07-15 MED ORDER — BUPIVACAINE HCL (PF) 0.5 % IJ SOLN
INTRAMUSCULAR | Status: DC | PRN
  Administered 2020-07-15: 2.7 mL

## 2020-07-15 MED ORDER — DOCUSATE SODIUM 100 MG PO CAPS
100.0000 mg | ORAL_CAPSULE | Freq: Two times a day (BID) | ORAL | Status: DC
  Administered 2020-07-15 – 2020-07-19 (×8): 100 mg via ORAL
  Filled 2020-07-15 (×8): qty 1

## 2020-07-15 MED ORDER — FENTANYL CITRATE (PF) 100 MCG/2ML IJ SOLN
INTRAMUSCULAR | Status: AC
  Filled 2020-07-15: qty 2

## 2020-07-15 MED ORDER — PREDNISONE 20 MG PO TABS: 20.0000 mg | ORAL_TABLET | Freq: Every day | ORAL | Status: DC

## 2020-07-15 MED ORDER — TROLAMINE SALICYLATE 10 % EX CREA
1.0000 "application " | TOPICAL_CREAM | CUTANEOUS | Status: DC | PRN
  Filled 2020-07-15: qty 85

## 2020-07-15 MED ORDER — MENTHOL 3 MG MT LOZG
1.0000 | LOZENGE | OROMUCOSAL | Status: DC | PRN
  Filled 2020-07-15: qty 9

## 2020-07-15 MED ORDER — CHLORHEXIDINE GLUCONATE 0.12 % MT SOLN
15.0000 mL | Freq: Once | OROMUCOSAL | Status: AC
  Administered 2020-07-15: 15 mL via OROMUCOSAL

## 2020-07-15 MED ORDER — ENOXAPARIN SODIUM 40 MG/0.4ML IJ SOSY
40.0000 mg | PREFILLED_SYRINGE | INTRAMUSCULAR | Status: DC
  Administered 2020-07-16 – 2020-07-19 (×4): 40 mg via SUBCUTANEOUS
  Filled 2020-07-15 (×4): qty 0.4

## 2020-07-15 MED ORDER — VITAMIN D 25 MCG (1000 UNIT) PO TABS
1000.0000 [IU] | ORAL_TABLET | Freq: Every day | ORAL | Status: DC
  Administered 2020-07-16 – 2020-07-19 (×4): 1000 [IU] via ORAL
  Filled 2020-07-15 (×4): qty 1

## 2020-07-15 MED ORDER — SODIUM CHLORIDE 0.9 % IR SOLN
Status: DC | PRN
  Administered 2020-07-15: 1000 mL

## 2020-07-15 MED ORDER — FENTANYL CITRATE (PF) 100 MCG/2ML IJ SOLN: 25.0000 ug | INTRAMUSCULAR | Status: DC | PRN

## 2020-07-15 MED ORDER — PROBIOTIC DAILY PO CAPS: ORAL_CAPSULE | Freq: Every day | ORAL | Status: DC

## 2020-07-15 MED ORDER — ACETAMINOPHEN 500 MG PO TABS
500.0000 mg | ORAL_TABLET | Freq: Four times a day (QID) | ORAL | Status: AC
  Administered 2020-07-15 (×2): 500 mg via ORAL
  Filled 2020-07-15 (×2): qty 1

## 2020-07-15 MED ORDER — METHOCARBAMOL 500 MG PO TABS
500.0000 mg | ORAL_TABLET | Freq: Four times a day (QID) | ORAL | Status: DC | PRN
  Administered 2020-07-15 – 2020-07-18 (×6): 500 mg via ORAL
  Filled 2020-07-15 (×6): qty 1

## 2020-07-15 MED ORDER — KETAMINE HCL 50 MG/ML IJ SOLN
INTRAMUSCULAR | Status: AC
  Filled 2020-07-15: qty 1

## 2020-07-15 MED ORDER — SODIUM CHLORIDE 0.9 % IV SOLN
75.0000 mL/h | INTRAVENOUS | Status: DC
  Administered 2020-07-15: 75 mL/h via INTRAVENOUS

## 2020-07-15 MED ORDER — BUPIVACAINE HCL (PF) 0.5 % IJ SOLN
INTRAMUSCULAR | Status: AC
  Filled 2020-07-15: qty 10

## 2020-07-15 MED ORDER — OMEPRAZOLE MAGNESIUM 20 MG PO TBEC: 40.0000 mg | DELAYED_RELEASE_TABLET | Freq: Every day | ORAL | Status: DC

## 2020-07-15 MED ORDER — POLYETHYLENE GLYCOL 3350 17 G PO PACK
17.0000 g | PACK | Freq: Every day | ORAL | Status: DC | PRN
  Administered 2020-07-18: 17 g via ORAL
  Filled 2020-07-15: qty 1

## 2020-07-15 SURGICAL SUPPLY — 42 items
BIT DRILL 4.3MMS DISTAL GRDTED (BIT) IMPLANT
BNDG COHESIVE 6X5 TAN STRL LF (GAUZE/BANDAGES/DRESSINGS) ×4 IMPLANT
CANISTER SUCT 1200ML W/VALVE (MISCELLANEOUS) ×2 IMPLANT
CORTICAL BONE SCR 5.0MM X 46MM (Screw) ×2 IMPLANT
COVER WAND RF STERILE (DRAPES) ×2 IMPLANT
DRAPE 3/4 80X56 (DRAPES) ×4 IMPLANT
DRAPE SHEET LG 3/4 BI-LAMINATE (DRAPES) ×1 IMPLANT
DRAPE SURG 17X11 SM STRL (DRAPES) ×4 IMPLANT
DRAPE U-SHAPE 47X51 STRL (DRAPES) ×2 IMPLANT
DRILL 4.3MMS DISTAL GRADUATED (BIT) ×2
DRSG MEPILEX SACRM 8.7X9.8 (GAUZE/BANDAGES/DRESSINGS) ×1 IMPLANT
DRSG OPSITE POSTOP 3X4 (GAUZE/BANDAGES/DRESSINGS) ×3 IMPLANT
DRSG OPSITE POSTOP 4X6 (GAUZE/BANDAGES/DRESSINGS) ×3 IMPLANT
DURAPREP 26ML APPLICATOR (WOUND CARE) ×4 IMPLANT
ELECT REM PT RETURN 9FT ADLT (ELECTROSURGICAL) ×2
ELECTRODE REM PT RTRN 9FT ADLT (ELECTROSURGICAL) ×1 IMPLANT
GLOVE SURG ORTHO LTX SZ9 (GLOVE) ×4 IMPLANT
GLOVE SURG UNDER POLY LF SZ9 (GLOVE) ×2 IMPLANT
GOWN STRL REUS TWL 2XL XL LVL4 (GOWN DISPOSABLE) ×2 IMPLANT
GOWN STRL REUS W/ TWL LRG LVL3 (GOWN DISPOSABLE) ×1 IMPLANT
GOWN STRL REUS W/TWL LRG LVL3 (GOWN DISPOSABLE) ×2
GUIDEPIN VERSANAIL DSP 3.2X444 (ORTHOPEDIC DISPOSABLE SUPPLIES) ×1 IMPLANT
GUIDEWIRE BALL NOSE 100CM (WIRE) ×1 IMPLANT
HFN RH 130 DEG 11MM X 360MM (Orthopedic Implant) ×1 IMPLANT
KIT TURNOVER CYSTO (KITS) ×2 IMPLANT
MANIFOLD NEPTUNE II (INSTRUMENTS) ×2 IMPLANT
MAT ABSORB  FLUID 56X50 GRAY (MISCELLANEOUS) ×2
MAT ABSORB FLUID 56X50 GRAY (MISCELLANEOUS) ×1 IMPLANT
NS IRRIG 1000ML POUR BTL (IV SOLUTION) ×2 IMPLANT
PACK HIP COMPR (MISCELLANEOUS) ×2 IMPLANT
PAD ARMBOARD 7.5X6 YLW CONV (MISCELLANEOUS) ×2 IMPLANT
SCREW BONE CORTICAL 5.0X42 (Screw) ×1 IMPLANT
SCREW CORTICL BON 5.0MM X 46MM (Screw) IMPLANT
SCREW LAG HIP NAIL 10.5X95 (Screw) ×1 IMPLANT
STAPLER SKIN PROX 35W (STAPLE) ×2 IMPLANT
SUCTION FRAZIER HANDLE 10FR (MISCELLANEOUS) ×2
SUCTION TUBE FRAZIER 10FR DISP (MISCELLANEOUS) ×1 IMPLANT
SUT VIC AB 0 CT1 36 (SUTURE) ×4 IMPLANT
SUT VIC AB 2-0 CT1 27 (SUTURE) ×2
SUT VIC AB 2-0 CT1 TAPERPNT 27 (SUTURE) ×1 IMPLANT
SUT VICRYL 0 AB UR-6 (SUTURE) ×2 IMPLANT
SYR 30ML LL (SYRINGE) ×2 IMPLANT

## 2020-07-15 NOTE — Anesthesia Preprocedure Evaluation (Signed)
Anesthesia Evaluation  Patient identified by MRN, date of birth, ID band Patient awake    Reviewed: Allergy & Precautions, NPO status , Patient's Chart, lab work & pertinent test results  History of Anesthesia Complications (+) PONV and history of anesthetic complications  Airway Mallampati: II  TM Distance: >3 FB Neck ROM: Full    Dental  (+) Poor Dentition   Pulmonary neg pulmonary ROS, neg sleep apnea, neg COPD,    breath sounds clear to auscultation- rhonchi (-) wheezing      Cardiovascular (-) hypertension(-) CAD, (-) Past MI, (-) Cardiac Stents and (-) CABG  Rhythm:Regular Rate:Normal - Systolic murmurs and - Diastolic murmurs    Neuro/Psych neg Seizures negative neurological ROS  negative psych ROS   GI/Hepatic Neg liver ROS, GERD  ,  Endo/Other  negative endocrine ROSneg diabetes  Renal/GU negative Renal ROS     Musculoskeletal  (+) Arthritis ,   Abdominal (+) - obese,   Peds  Hematology  (+) anemia ,   Anesthesia Other Findings Past Medical History: No date: Arthritis No date: Chest pain     Comment:  a. 05/2015 - post-prandial. No date: GERD (gastroesophageal reflux disease)     Comment:  a. takes Omeprazole daily, prev seen by D. Brodie. No date: Heart murmur     Comment:  a. 05/2013 Echo: EF 55-60%, gr2 DD, mild AI/MR. No date: History of blood transfusion     Comment:  no abnormal reaction noted  No date: History of migraine headaches     Comment:  a. last 10/2013 No date: Iron deficiency anemia No date: Joint pain No date: PONV (postoperative nausea and vomiting) 2011: Rectocele   Reproductive/Obstetrics                             Lab Results  Component Value Date   WBC 12.7 (H) 07/14/2020   HGB 9.2 (L) 07/14/2020   HCT 27.0 (L) 07/14/2020   MCV 98.2 07/14/2020   PLT 371 07/14/2020    Anesthesia Physical Anesthesia Plan  ASA: II  Anesthesia Plan: Spinal    Post-op Pain Management:    Induction:   PONV Risk Score and Plan: 3 and Propofol infusion  Airway Management Planned: Natural Airway  Additional Equipment:   Intra-op Plan:   Post-operative Plan:   Informed Consent: I have reviewed the patients History and Physical, chart, labs and discussed the procedure including the risks, benefits and alternatives for the proposed anesthesia with the patient or authorized representative who has indicated his/her understanding and acceptance.     Dental advisory given  Plan Discussed with: CRNA and Anesthesiologist  Anesthesia Plan Comments:         Anesthesia Quick Evaluation

## 2020-07-15 NOTE — Transfer of Care (Signed)
Immediate Anesthesia Transfer of Care Note  Patient: Melissa [name]  Procedure(s) Performed: INTRAMEDULLARY (IM) NAIL INTERTROCHANTRIC (Right )  Patient Location: PACU  Anesthesia Type:Spinal  Level of Consciousness: drowsy  Airway & Oxygen Therapy: Patient Spontanous Breathing and Patient connected to face mask oxygen  Post-op Assessment: Report given to RN and Post -op Vital signs reviewed and stable  Post vital signs: Reviewed and stable  Last Vitals:  Vitals Value Taken Time  BP 109/55 07/15/20 1500  Temp    Pulse 69 07/15/20 1502  Resp 11 07/15/20 1502  SpO2 100 % 07/15/20 1502  Vitals shown include unvalidated device data.  Last Pain:  Vitals:   07/15/20 1208  TempSrc: Oral  PainSc: 3       Patients Stated Pain Goal: 2 (95/09/32 6712)  Complications: No complications documented.

## 2020-07-15 NOTE — Progress Notes (Signed)
Progress Note    Melissa [name]  ENM:[id number] DOB: 1928-04-13  DOA: 07/14/2020 PCP: [name], MD      Brief Narrative:    Medical records reviewed and are as summarized below:  Melissa [name] is a 87 y.o. female with medical history significant for iron deficiency anemia who presented to the hospital after an accidental fall.  She was found to have right hip fracture.  She was treated with analgesics.      Assessment/Plan:   Principal Problem:   Closed intertrochanteric fracture of hip, right, initial encounter ([place]) Active Problems:   ANEMIA, IRON DEFICIENCY   Accidental fall   Nutrition Problem: Increased nutrient needs Etiology: hip fracture  Signs/Symptoms: estimated needs   Body mass index is 18.88 kg/m.   Closed right intertrochanteric hip fracture, s/p fall: S/p intramedullary fixation of right intertrochanteric hip fracture.  Analgesics as needed for pain.  PT and OT evaluation.  Follow-up with orthopedic surgeon.  Acute hypoxia: Atelectasis versus pneumonia on chest x-ray.  No clear evidence of pneumonia at this time.  Taper off oxygen as able.    Heart murmur and cardiomegaly on chest x-ray.  2D echo has been ordered for further evaluation.    Diet Order            Diet NPO time specified  Diet effective midnight                    Consultants:  Orthopedic surgeon  Procedures:  Right hip surgery    Medications:    Continuous Infusions: . lactated ringers 10 mL/hr at 07/15/20 1313  . [MAR Hold] promethazine (PHENERGAN) injection (IM or IVPB)       Anti-infectives (From admission, onward)   Start     Dose/Rate Route Frequency Ordered Stop   07/15/20 1431  gentamicin 80 mg in 0.9% sodium chloride 250 mL irrigation  Status:  Discontinued          As needed 07/15/20 1432 07/15/20 1453   07/15/20 1000  [MAR Hold]  ceFAZolin (ANCEF) IVPB 2g/100 mL premix        (MAR Hold since Fri 07/15/2020 at 1206.Hold Reason: Transfer  to a Procedural area.)   2 g 200 mL/hr over 30 Minutes Intravenous 30 min pre-op 07/14/20 2042 07/15/20 1338             Family Communication/Anticipated D/C date and plan/Code Status   DVT prophylaxis: Place and maintain sequential compression device Start: 07/14/20 2224 SCDs Start: 07/14/20 2050     Code Status: DNR  Family Communication: None Disposition Plan:    Status is: Inpatient  Remains inpatient appropriate because:Inpatient level of care appropriate due to severity of illness   Dispo: The patient is from: Home              Anticipated d/c is to: SNF              Patient currently is not medically stable to d/c.   Difficult to place patient No           Subjective:   C/o pain in the right hip.  Her husband was at the bedside.  She was seen before undergoing hip surgery.  Objective:    Vitals:   07/15/20 1208 07/15/20 1459 07/15/20 1500 07/15/20 1515  BP: (!) 146/75 104/61 (!) 109/55 115/64  Pulse: 81 73 74 64  Resp: 18 11 11 10   Temp: 97.6 F (36.4 C) 97.9 F (  36.6 C)    TempSrc: Oral     SpO2: 100% 100% 100% 100%  Weight: 49.9 kg     Height: 5\' 4"  (1.626 m)      No data found.   Intake/Output Summary (Last 24 hours) at 07/15/2020 1533 Last data filed at 07/15/2020 1502 Gross per 24 hour  Intake 600 ml  Output 150 ml  Net 450 ml   Filed Weights   07/14/20 1856 07/15/20 1208  Weight: 49.9 kg 49.9 kg    Exam:  GEN: NAD SKIN: Warm and dry EYES: EOMI  ENT: MMM CV: RRR PULM: CTA B ABD: soft, ND, NT, +BS CNS: AAO x 3, non focal EXT: Right hip tenderness.        Data Reviewed:   I have personally reviewed following labs and imaging studies:  Labs: Labs show the following:   Basic Metabolic Panel: Recent Labs  Lab 07/14/20 1919  NA 137  K 4.3  CL 103  CO2 23  GLUCOSE 148*  BUN 26*  CREATININE 0.71  CALCIUM 8.8*   GFR Estimated Creatinine Clearance: 37.6 mL/min (by C-G formula based on SCr of 0.71  mg/dL). Liver Function Tests: Recent Labs  Lab 07/14/20 1919  AST 33  ALT 23  ALKPHOS 64  BILITOT 1.1  PROT 6.7  ALBUMIN 4.1   No results for input(s): LIPASE, AMYLASE in the last 168 hours. No results for input(s): AMMONIA in the last 168 hours. Coagulation profile Recent Labs  Lab 07/14/20 1919  INR 1.1    CBC: Recent Labs  Lab 07/14/20 1919  WBC 12.7*  NEUTROABS 7.9*  HGB 9.2*  HCT 27.0*  MCV 98.2  PLT 371   Cardiac Enzymes: No results for input(s): CKTOTAL, CKMB, CKMBINDEX, TROPONINI in the last 168 hours. BNP (last 3 results) No results for input(s): PROBNP in the last 8760 hours. CBG: No results for input(s): GLUCAP in the last 168 hours. D-Dimer: No results for input(s): DDIMER in the last 72 hours. Hgb A1c: No results for input(s): HGBA1C in the last 72 hours. Lipid Profile: No results for input(s): CHOL, HDL, LDLCALC, TRIG, CHOLHDL, LDLDIRECT in the last 72 hours. Thyroid function studies: No results for input(s): TSH, T4TOTAL, T3FREE, THYROIDAB in the last 72 hours.  Invalid input(s): FREET3 Anemia work up: No results for input(s): VITAMINB12, FOLATE, FERRITIN, TIBC, IRON, RETICCTPCT in the last 72 hours. Sepsis Labs: Recent Labs  Lab 07/14/20 1919 07/14/20 2221  PROCALCITON  --  <0.10  WBC 12.7*  --     Microbiology Recent Results (from the past 240 hour(s))  Resp Panel by RT-PCR (Flu A&B, Covid) Nasopharyngeal Swab     Status: None   Collection Time: 07/14/20  7:53 PM   Specimen: Nasopharyngeal Swab; Nasopharyngeal(NP) swabs in vial transport medium  Result Value Ref Range Status   SARS Coronavirus 2 by RT PCR NEGATIVE NEGATIVE Final    Comment: (NOTE) SARS-CoV-2 target nucleic acids are NOT DETECTED.  The SARS-CoV-2 RNA is generally detectable in upper respiratory specimens during the acute phase of infection. The lowest concentration of SARS-CoV-2 viral copies this assay can detect is 138 copies/mL. A negative result does not  preclude SARS-Cov-2 infection and should not be used as the sole basis for treatment or other patient management decisions. A negative result may occur with  improper specimen collection/handling, submission of specimen other than nasopharyngeal swab, presence of viral mutation(s) within the areas targeted by this assay, and inadequate number of viral copies(<138 copies/mL). A negative result  must be combined with clinical observations, patient history, and epidemiological information. The expected result is Negative.  Fact Sheet for Patients:  EntrepreneurPulse.com.au  Fact Sheet for Healthcare Providers:  IncredibleEmployment.be  This test is no t yet approved or cleared by the [place] FDA and  has been authorized for detection and/or diagnosis of SARS-CoV-2 by FDA under an Emergency Use Authorization (EUA). This EUA will remain  in effect (meaning this test can be used) for the duration of the COVID-19 declaration under Section 564(b)(1) of the Act, 21 U.S.C.section 360bbb-3(b)(1), unless the authorization is terminated  or revoked sooner.       Influenza A by PCR NEGATIVE NEGATIVE Final   Influenza B by PCR NEGATIVE NEGATIVE Final    Comment: (NOTE) The Xpert Xpress SARS-CoV-2/FLU/RSV plus assay is intended as an aid in the diagnosis of influenza from Nasopharyngeal swab specimens and should not be used as a sole basis for treatment. Nasal washings and aspirates are unacceptable for Xpert Xpress SARS-CoV-2/FLU/RSV testing.  Fact Sheet for Patients: EntrepreneurPulse.com.au  Fact Sheet for Healthcare Providers: IncredibleEmployment.be  This test is not yet approved or cleared by the [place] FDA and has been authorized for detection and/or diagnosis of SARS-CoV-2 by FDA under an Emergency Use Authorization (EUA). This EUA will remain in effect (meaning this test can be used) for the  duration of the COVID-19 declaration under Section 564(b)(1) of the Act, 21 U.S.C. section 360bbb-3(b)(1), unless the authorization is terminated or revoked.  Performed at [facility], [address] [place], [place] [address]     Procedures and diagnostic studies:  DG Chest 1 View  Result Date: 07/14/2020 CLINICAL DATA:  Hip fracture EXAM: CHEST  1 VIEW COMPARISON:  06/25/2017 FINDINGS: Right shoulder replacement. No pleural effusion or pneumothorax. Hazy right lung base opacity. Mild cardiomegaly. No pneumothorax. IMPRESSION: 1. Hazy right lung base opacity which may be due to atelectasis or mild pneumonia 2. Mild cardiomegaly Electronically Signed   By: Donavan Foil M.D.   On: 07/14/2020 19:58   DG HIP OPERATIVE UNILAT W OR W/O PELVIS RIGHT  Result Date: 07/15/2020 CLINICAL DATA:  Right hip long nail. EXAM: OPERATIVE RIGHT HIP (WITH PELVIS IF PERFORMED) TECHNIQUE: Fluoroscopic spot image(s) were submitted for interpretation post-operatively. COMPARISON:  Preoperative radiograph yesterday. FINDINGS: Eight fluoroscopic spot views of the right hip obtained in the operating room. Intramedullary nail with trans trochanteric and distal locking screw fixation of intertrochanteric femur fracture. Improved fracture alignment. Fluoroscopy time 1 minutes 27 seconds. IMPRESSION: Procedural fluoroscopy during right proximal femur fracture fixation. Electronically Signed   By: Keith Rake M.D.   On: 07/15/2020 15:17   DG Hip Unilat With Pelvis 2-3 Views Right  Result Date: 07/14/2020 CLINICAL DATA:  Fall with deformity EXAM: DG HIP (WITH OR WITHOUT PELVIS) 2-3V RIGHT COMPARISON:  07/31/2013 FINDINGS: Post treatment changes of the sacrum. Pubic symphysis and rami appear intact. Both femoral heads project in joint. Acute comminuted and displaced right intertrochanteric fracture. Mild apex anterior angulation. IMPRESSION: Acute comminuted and displaced right intertrochanteric fracture  Electronically Signed   By: Donavan Foil M.D.   On: 07/14/2020 19:57               LOS: 1 day   Kimya Mccahill  [facility]   Pager on [email or web address]. If 7PM-7AM, please contact night-coverage at [email or web address]     07/15/2020, 3:33 PM

## 2020-07-15 NOTE — Anesthesia Procedure Notes (Signed)
Spinal  Patient location during procedure: OR Start time: 07/15/2020 1:25 PM End time: 07/15/2020 1:35 PM Reason for block: surgical anesthesia Staffing Performed: other anesthesia staff and anesthesiologist  Anesthesiologist: Emmie Niemann, MD Other anesthesia staff: Daiva Huge, RN Preanesthetic Checklist Completed: patient identified, IV checked, site marked, risks and benefits discussed, surgical consent, monitors and equipment checked, pre-op evaluation and timeout performed Spinal Block Patient position: left lateral decubitus Prep: ChloraPrep Patient monitoring: heart rate, cardiac monitor, continuous pulse ox and blood pressure Approach: midline Location: L3-4 Injection technique: single-shot Needle Needle type: Introducer and Pencil-Tip  Needle gauge: 24 G Needle length: 9 cm Assessment Sensory level: T4 Events: CSF return

## 2020-07-15 NOTE — Op Note (Signed)
07/15/2020  3:13 PM  PATIENT:  Melissa [name]    PRE-OPERATIVE DIAGNOSIS:  Right Intertrochanteric Hip Fracture  POST-OPERATIVE DIAGNOSIS:  Same  PROCEDURE:  INTRAMEDULLARY FIXATION OF RIGHT INTERTROCHANTERIC HIP FRACTURE  SURGEON:  Thornton Park, MD  ANESTHESIA:   Spinal  EBL:  50 mL  IMPLANT:  ZIMMER BIOMET AFFIXUS NAIL 31mm x 360 mm with a 148mm lag screw and distal interlocking screws 43mm  and 46 mm in length.  PREOPERATIVE INDICATIONS:  Melissa [name] is a  87 y.o. female with a diagnosis of Right Hip Fracture who failed conservative measures and elected for surgical management.    The risks, benefits and alternatives were discussed with the patient and their family.  The risks include but are not limited to infection, bleeding requiring blood transfusion, nerve or blood vessel injury, malunion, nonunion, hardware prominence, hardware failure, leg length discrepancy or change in lower extremity rotation and need for further surgery including hardware removal with conversion to a total hip arthroplasty. Medical risks include but are not limited to DVT and pulmonary embolism, myocardial infarction, stroke, pneumonia, respiratory failure and death. The patient understood these risks and wished to proceed with surgery.  OPERATIVE PROCEDURE:  The patient was brought to the operating room and placed in the supine position on the fracture table. The patient received spinal anesthesia.  A foley catheter was placed.  A closed reduction was performed under C-arm guidance.  The fracture reduction was confirmed on both AP and lateral views. After adequate reduction was achieved, a time out was performed to verify the patient's name, date of birth, medical record number, correct site of surgery correct procedure to be performed. The timeout was also used to verify the patient received antibiotics and all appropriate instruments, implants and radiographic studies were available in the room. Once  all in attendance were in agreement, the case began. The patient was prepped and draped in a sterile fashion. She received preoperative antibiotics with 2 gram IV ancef.  An incision was made proximal to the greater trochanter in line with the femur. A guidewire was placed over the tip of the greater trochanter and advanced by drill into the proximal femur to the level of the lesser trochanter.  Confirmation of the drill pin position was made on AP and lateral C-arm images.  The threaded guidepin was then overdrilled with the proximal femoral entry reamer.  A ball-tipped guidewire was then advanced down the intramedullary canal, across the fracture, and down the femoral shaft to the knee.  The ball tip guidewire's position was confirmed at both the knee and hip via C-arm imaging. A depth gauge was used to measure the length of the long nail to be used. It was measured to be 360 mm. Sequential long reamers were used up to 13 mm diameter.  The actual nail was then inserted into the proximal femur, across the fracture site and down the femoral shaft. Its position was confirmed on AP and lateral C-arm images.  The ball tip guidewire was removed.  Once the nail was completely seated, the drill guide for the lag screw was placed through the guide arm for the Affixus nail. A guidepin was then placed through this drill guide and advanced through the lateral cortex of the femur, across the fracture site and into the femoral head achieving a tip apex distance of less than 25 mm. The length of the drill pin was measured to 95 mm, and then the drill for the lag screw was  advanced through the lateral cortex, across the fracture site and up into the femoral head to the depth of the lag screw. The lag screw was then advanced by hand into position across the fracture site into the femoral head. Its final position was confirmed on AP and lateral C-arm images. Compression was applied as traction was carefully released. The set  screw in the top of the intramedullary rod was tightened by hand using a screwdriver. It was backed off a quarter turn to allow for compression at the fracture site.  The attention was then turned to placement of the distal interlocking screws. A perfect circle technique was used. 2 small stab incisions were made over the distal interlocking screw holes.  A free hand technique was used to drill both distal interlocking screws. The depth of the screw holes was measured with a depth gauge. The 76mm and 17mm screws were then advanced into position and tightened by hand. Final C-arm images of the entire intramedullary construct were taken in both the AP and lateral planes.   The wounds were irrigated copiously and closed with 0 Vicryl for closure of the deep fascia and 2-0 Vicryl for subcutaneous closure. The skin was approximated with staples. A dry sterile dressing was applied. I was scrubbed and present the entire case and all sharp, sponge and instrument counts were correct at the conclusion of the case. Patient was transferred to a hospital bed and brought to PACU in stable condition.     Timoteo Gaul, MD

## 2020-07-15 NOTE — Consult Note (Signed)
ORTHOPAEDIC CONSULTATION  REQUESTING PHYSICIAN: Jennye Boroughs, MD  Chief Complaint: Right hip pain status post fall  HPI: Melissa [name] is a 87 y.o. female sustained a fall at home.  She states she got up from a rolling desk chair and fell onto her right side.  Patient has pain in the right hip and has been unable to weight-bear following the injury.  She was admitted to the medical service for preoperative clearance.  Orthopedics is consulted for management of her hip fracture.  Patient denies other injuries.  Past Medical History:  Diagnosis Date  . Arthritis   . Chest pain    a. 05/2015 - post-prandial.  . GERD (gastroesophageal reflux disease)    a. takes Omeprazole daily, prev seen by D. Brodie.  Marland Kitchen Heart murmur    a. 05/2013 Echo: EF 55-60%, gr2 DD, mild AI/MR.  Marland Kitchen History of blood transfusion    no abnormal reaction noted   . History of migraine headaches    a. last 10/2013  . Iron deficiency anemia   . Joint pain   . PONV (postoperative nausea and vomiting)   . Rectocele 2011   Past Surgical History:  Procedure Laterality Date  . cataract surgery  Bilateral    R 03/2001; L 05/2001.  Marland Kitchen COLONOSCOPY    . HYSTEROSCOPY  11/1999   w/ resection of endometrial polyp by uterine curetting.  Marland Kitchen JOINT REPLACEMENT Bilateral R-9/04, L- 8/05   Total Knee Arthroplasties.  Marland Kitchen KYPHOPLASTY N/A 04/10/2019   Procedure: KYPHOPLASTY;  Surgeon: Hessie Knows, MD;  Location: [facility];  Service: Orthopedics;  Laterality: N/A;  . REVERSE SHOULDER ARTHROPLASTY Right 11/26/2013   Procedure: RIGHT REVERSE SHOULDER ARTHROPLASTY;  Surgeon: Marin Shutter, MD;  Location: [facility];  Service: Orthopedics;  Laterality: Right;  . SHOULDER ARTHROSCOPY Right   . THYROIDECTOMY  at age 69  . TONSILLECTOMY     Social History   Socioeconomic History  . Marital status: Married    Spouse name: Not on file  . Number of children: Not on file  . Years of education: Not on file  . Highest education level: Not on  file  Occupational History  . Not on file  Tobacco Use  . Smoking status: Never Smoker  . Smokeless tobacco: Never Used  Substance and Sexual Activity  . Alcohol use: No  . Drug use: No  . Sexual activity: Never    Birth control/protection: Post-menopausal  Other Topics Concern  . Not on file  Social History Narrative   Retired.  Very active.  Lives at [facility] with 48 y/o husband.  Walks one mile daily (~ 40 mins) and partakes in H2O aerobics twice/wk.  Enjoys gardening.   Social Determinants of Health   Financial Resource Strain: Not on file  Food Insecurity: Not on file  Transportation Needs: Not on file  Physical Activity: Not on file  Stress: Not on file  Social Connections: Not on file   Family History  Problem Relation Age of Onset  . Heart attack Mother        died @ 43  . Heart attack Brother        died @ 5  . Heart attack Brother        died @ 61  . Other Father        died suddenly @ 48.   Allergies  Allergen Reactions  . Sulfonamide Derivatives Hives   Prior to Admission medications   Medication Sig Start Date  End Date Taking? Authorizing Provider  cholecalciferol (VITAMIN D) 1000 units tablet Take 1,000 Units by mouth daily.    [provider]  Multiple Vitamin (MULTIVITAMIN WITH MINERALS) TABS tablet Take 1 tablet by mouth daily.    [provider]  mupirocin ointment (BACTROBAN) 2 % Apply to wound after soaking BID 02/26/18   Hyatt, Max T, DPM  omeprazole (PRILOSEC OTC) 20 MG tablet Take 40 mg by mouth daily.     [provider]  predniSONE (DELTASONE) 20 MG tablet Take 20-40 mg by mouth daily at 6 (six) AM. 04/06/19   [provider]  Probiotic Product (PROBIOTIC DAILY PO) Take 1 tablet by mouth daily at 6 (six) AM.     [provider]  traMADol (ULTRAM) 50 MG tablet Take 1 tablet (50 mg total) by mouth every 6 (six) hours as needed. 10/17/19   Caryn Section Linden Dolin, PA-C  trolamine salicylate (ASPERCREME) 10 %  cream Apply 1 application topically as needed for muscle pain.    [provider]   DG Chest 1 View  Result Date: 07/14/2020 CLINICAL DATA:  Hip fracture EXAM: CHEST  1 VIEW COMPARISON:  06/25/2017 FINDINGS: Right shoulder replacement. No pleural effusion or pneumothorax. Hazy right lung base opacity. Mild cardiomegaly. No pneumothorax. IMPRESSION: 1. Hazy right lung base opacity which may be due to atelectasis or mild pneumonia 2. Mild cardiomegaly Electronically Signed   By: Donavan Foil M.D.   On: 07/14/2020 19:58   DG Hip Unilat With Pelvis 2-3 Views Right  Result Date: 07/14/2020 CLINICAL DATA:  Fall with deformity EXAM: DG HIP (WITH OR WITHOUT PELVIS) 2-3V RIGHT COMPARISON:  07/31/2013 FINDINGS: Post treatment changes of the sacrum. Pubic symphysis and rami appear intact. Both femoral heads project in joint. Acute comminuted and displaced right intertrochanteric fracture. Mild apex anterior angulation. IMPRESSION: Acute comminuted and displaced right intertrochanteric fracture Electronically Signed   By: Donavan Foil M.D.   On: 07/14/2020 19:57    Positive ROS: All other systems have been reviewed and were otherwise negative with the exception of those mentioned in the HPI and as above.  Physical Exam: General: Alert, no acute distress   MUSCULOSKELETAL: Right lower extremity: Patient skin is intact.  There is no erythema ecchymosis or swelling.  Her thigh and leg compartments are soft and compressible.  She has palpable pedal pulses, intact sensation light touch and intact motor function distally.  Her right lower extremity is shortened and externally rotated.  Assessment: Right closed, displaced intertrochanteric hip fracture  Plan: I explained to the patient today that she is sustaining a right hip fracture.  I have recommended intramedullary fixation for this fracture given its displacement and her inability to bear weight.  I explained to her the details of the operation  as well as the postoperative course.  The patient states that her husband is coming into the hospital later this morning.  I would be happy to discuss the surgery again with him when he arrives.  Patient will be n.p.o. today in preparation for surgery.  The OR estimates a 1 PM start time.  I reviewed the patient's x-rays and labs in preparation for this case.  She has been cleared for surgery by the medical service.  Hold all anticoagulation in preparation for surgery.   Thornton Park, MD    07/15/2020 9:04 AM

## 2020-07-15 NOTE — Progress Notes (Signed)
Initial Nutrition Assessment  DOCUMENTATION CODES:  Not applicable  INTERVENTION:   Advance to regular diet after surgery   Add Ensure Enlive po BID, each supplement provides 350 kcal and 20 grams of protein  Request new measured weight  NUTRITION DIAGNOSIS:  Increased nutrient needs related to hip fracture as evidenced by estimated needs.  GOAL:  Patient will meet greater than or equal to 90% of their needs  MONITOR:  PO intake,Diet advancement,Skin  REASON FOR ASSESSMENT:  Consult Hip fracture protocol  ASSESSMENT:  Pt presented to ED from home after falling out of a rolling chair onto her right side and having significant, nonradiating right upper leg and hip pain. XR showed a right femur fracture. PMH relevant for GERD and anemia   RN staff assisting with care at the first attempted visit and pt taken to OR at second attempt. Per chart review, pt lives at home with her husband and is independent of needs at baseline. Normal vitamin d levels noted ~1 year ago.  Pt would likely benefit from a nutrition supplement after surgery to encourage post-op healing. Current BMI is in undesirable range for age >2.     Nutritionally Relevant Medications: PRN Meds:.bisacodyl, ondansetron, promethazine, senna-docusate  Labs reviewed  NUTRITION - FOCUSED PHYSICAL EXAM: Defer, pt unavailable  Diet Order:   Diet Order            Diet NPO time specified  Diet effective midnight                 EDUCATION NEEDS:  No education needs have been identified at this time  Skin:  Skin Assessment: Reviewed RN Assessment  Last BM:  5/25 per RN documentation  Height:  Ht Readings from Last 1 Encounters:  07/15/20 5\' 4"  (1.626 m)    Weight:  Wt Readings from Last 1 Encounters:  07/15/20 49.9 kg    Ideal Body Weight:  54.5 kg  BMI:  Body mass index is 18.88 kg/m.  Estimated Nutritional Needs:   Kcal:  1400-1600 kcal/d  Protein:  70-85 g/d  Fluid:  >1556mL/d  Ranell Patrick, RD, LDN Clinical Dietitian Pager on [facility]

## 2020-07-16 ENCOUNTER — Inpatient Hospital Stay: Admit: 2020-07-16 | Payer: Medicare Other

## 2020-07-16 DIAGNOSIS — S72141A Displaced intertrochanteric fracture of right femur, initial encounter for closed fracture: Secondary | ICD-10-CM | POA: Diagnosis not present

## 2020-07-16 LAB — CBC
HCT: 22.4 % — ABNORMAL LOW (ref 36.0–46.0)
Hemoglobin: 7.7 g/dL — ABNORMAL LOW (ref 12.0–15.0)
MCH: 33.8 pg (ref 26.0–34.0)
MCHC: 34.4 g/dL (ref 30.0–36.0)
MCV: 98.2 fL (ref 80.0–100.0)
Platelets: 293 10*3/uL (ref 150–400)
RBC: 2.28 MIL/uL — ABNORMAL LOW (ref 3.87–5.11)
RDW: 18.4 % — ABNORMAL HIGH (ref 11.5–15.5)
WBC: 10.3 10*3/uL (ref 4.0–10.5)
nRBC: 0.6 % — ABNORMAL HIGH (ref 0.0–0.2)

## 2020-07-16 LAB — BASIC METABOLIC PANEL
Anion gap: 7 (ref 5–15)
BUN: 23 mg/dL (ref 8–23)
CO2: 26 mmol/L (ref 22–32)
Calcium: 7.9 mg/dL — ABNORMAL LOW (ref 8.9–10.3)
Chloride: 99 mmol/L (ref 98–111)
Creatinine, Ser: 0.63 mg/dL (ref 0.44–1.00)
GFR, Estimated: >60 mL/min (ref >60)
Glucose, Bld: 122 mg/dL — ABNORMAL HIGH (ref 70–99)
Potassium: 3.8 mmol/L (ref 3.5–5.1)
Sodium: 132 mmol/L — ABNORMAL LOW (ref 135–145)

## 2020-07-16 NOTE — Progress Notes (Signed)
Physical Therapy Treatment Patient Details Name: Melissa [name] MRN: [id number] DOB: [birth year] Today's Date: 07/16/2020    History of Present Illness Pt is an 87 y/o female who presents to [facility] following a fall at home on 07/14/20 resuliting in a R hip fracture. Pt is s/p intramedullary fixation of R intertrochanteric hip fracure. PMH: arthritis, chest pain, GERD, heart murmur, HA migraine, iron deficiecy and PONV.    PT Comments    Pt received napping in her recliner, verbalizing fatigue and not wanting to do any therapy. Despite her fatigue pt did participate somewhat with PT interventions following PT encouragement. Completed seated therapeutic exercises as follows: pillow squeezes, ankle pumps x20, glute sets x10, LAQs x10 with extra time required to complete exercises and max VC and tactile cueing required to complete exercises with the appropriate pace and form. Pt required multiple VCs during treatment to maintain level of arousal to participate in interventions. Completed stand pivot transfer from recliner into bed requiring maxA from therapist, VCs for sequencing and tactile cues for appropriate hand placement. Pt c/o R groin pain that decreased with position, PLB and relaxation techniques. She required multiple cues throughout to use her LLE without assistance. She continues to only be oriented to self and perseverates on her husband's presence. Pt left supine in bed with call bell within reach, needs met and RN aware of pain medication request.    Follow Up Recommendations  SNF     Equipment Recommendations  None recommended by PT    Recommendations for Other Services       Precautions / Restrictions Precautions Precautions: Fall Restrictions Weight Bearing Restrictions: Yes RLE Weight Bearing: Weight bearing as tolerated    Mobility  Bed Mobility Overal bed mobility: Needs Assistance Bed Mobility: Sit to Supine       Sit to supine: Max assist   General bed mobility  comments: Pt required max A to complete transfer to sit EOB, she required A to mobilize BIL LE. Pt required extra time during transfers 2/2 to R groin pain Patient Response: Anxious  Transfers Overall transfer level: Needs assistance   Transfers: Stand Pivot Transfers   Stand pivot transfers: Max assist       General transfer comment: Pt required max A +1 to complete stand pivot transfer to sit EOB with maxA from therapist. Pt kept closing her eyes and required VCs to maintain level of arousal during transfer. She was able to stand EOB for 15 seconds prior to sitting with maxA.  Ambulation/Gait                 Stairs             Wheelchair Mobility    Modified Rankin (Stroke Patients Only)       Balance Overall balance assessment: Needs assistance Sitting-balance support: Bilateral upper extremity supported Sitting balance-Leahy Scale: Fair Sitting balance - Comments: Pt with decreased safety awareness required tactile cues for appopriate hand placement on the bed and max A to maintain seated position EOB safely Postural control: Right lateral lean Standing balance support: Bilateral upper extremity supported Standing balance-Leahy Scale: Zero Standing balance comment: Pt currently unable to balance in the standing position requiring max A to stand safely 2/2 pain and strength deficits in RLE                            Cognition Arousal/Alertness: Lethargic Behavior During Therapy: Anxious Overall Cognitive Status: Impaired/Different from  baseline Area of Impairment: Safety/judgement;Orientation;Following commands                 Orientation Level: Disoriented to;Situation;Place;Time     Following Commands: Follows one step commands consistently Safety/Judgement: Decreased awareness of safety     General Comments: Pt anxious and only oriented to self, she continues to be confused about situation. She performs better when her husband is  present      Exercises Total Joint Exercises Ankle Circles/Pumps: AROM;Both;20 reps (Pt required multiple verbal and visual cues throughout interventions in order to complete repetitions) Gluteal Sets: AROM;Strengthening;Both;10 reps (Tactile cues provided for appropriate glute activation) Towel Squeeze: AROM;Both;10 reps (VCs provided during exercise for appropriate add activation) Long Arc Quad: AROM;Strengthening;Both;10 reps (RLE able to complete partial range LAQ with slow activation)    General Comments        Pertinent Vitals/Pain Pain Assessment: Faces Faces Pain Scale: Hurts whole lot Pain Location: R hip Pain Descriptors / Indicators: Aching;Dull;Tightness Pain Intervention(s): Limited activity within patient's tolerance;Repositioned;Patient requesting pain meds-RN notified    Home Living                      Prior Function            PT Goals (current goals can now be found in the care plan section) Acute Rehab PT Goals Patient Stated Goal: Decrease hip pain PT Goal Formulation: With patient Time For Goal Achievement: 07/29/20 Potential to Achieve Goals: Fair Progress towards PT goals: Progressing toward goals    Frequency    BID      PT Plan Current plan remains appropriate    Co-evaluation              AM-PAC PT "6 Clicks" Mobility   Outcome Measure  Help needed turning from your back to your side while in a flat bed without using bedrails?: A Lot Help needed moving from lying on your back to sitting on the side of a flat bed without using bedrails?: A Lot Help needed moving to and from a bed to a chair (including a wheelchair)?: A Lot Help needed standing up from a chair using your arms (e.g., wheelchair or bedside chair)?: A Lot Help needed to walk in hospital room?: Total Help needed climbing 3-5 steps with a railing? : Total 6 Click Score: 10    End of Session Equipment Utilized During Treatment: Gait belt Activity Tolerance:  Patient limited by pain;Patient limited by fatigue Patient left: with call bell/phone within reach;in bed;with bed alarm set Nurse Communication: Mobility status;Patient requests pain meds PT Visit Diagnosis: Unsteadiness on feet (R26.81);Muscle weakness (generalized) (M62.81);Repeated falls (R29.6)     Time: 7949-9718 PT Time Calculation (min) (ACUTE ONLY): 40 min  Charges:  $Therapeutic Activity: 23-37 mins                     Duanne Guess, PT, DPT 07/16/20, 2:56 PM    Isaias Cowman 07/16/2020, 2:49 PM

## 2020-07-16 NOTE — TOC Progression Note (Signed)
Transition of Care [facility]) - Progression Note    Patient Details  Name: Melissa [name] MRN: [id number] Date of Birth: May 21, 1928  Transition of Care [facility]) CM/SW [place], RN Phone Number: 07/16/2020, 3:59 PM  Clinical Narrative:   TOC in to see patient and spouse at bedside, they are residents of [facility].  Discussed recommendations for SNF, spouse states she will go to [facility].  Spouse denies concerns with getting to appointments or getting medications.  No other concerns for TOC at this time.    Expected Discharge Plan:  ([place]) Barriers to Discharge: Continued Medical Work up  Expected Discharge Plan and Services Expected Discharge Plan:  ([place])     Post Acute Care Choice:  ([place])                                         Social Determinants of Health (SDOH) Interventions    Readmission Risk Interventions No flowsheet data found.

## 2020-07-16 NOTE — Anesthesia Postprocedure Evaluation (Signed)
Anesthesia Post Note  Patient: Melissa [name]  Procedure(s) Performed: INTRAMEDULLARY (IM) NAIL INTERTROCHANTRIC (Right )  Patient location during evaluation: Nursing Unit Anesthesia Type: Spinal Level of consciousness: oriented and awake and alert Pain management: pain level controlled Vital Signs Assessment: post-procedure vital signs reviewed and stable Respiratory status: spontaneous breathing and respiratory function stable Cardiovascular status: blood pressure returned to baseline and stable Postop Assessment: no headache, no backache, no apparent nausea or vomiting and patient able to bend at knees Anesthetic complications: no Comments: Doing well. Sitting up in chair eating lunch with husband. No complaints.   No complications documented.   Last Vitals:  Vitals:   07/16/20 0439 07/16/20 0839  BP: (!) 110/51 (!) 118/56  Pulse: 77 86  Resp: 17 18  Temp: (!) 36.3 C 36.6 C  SpO2: 95% 91%    Last Pain:  Vitals:   07/16/20 0439  TempSrc: Oral  PainSc:                  Larey Dresser

## 2020-07-16 NOTE — Progress Notes (Signed)
Progress Note    Melissa [name]  JQB:[id number] DOB: [birth year]  DOA: 07/14/2020 PCP: [name], MD      Brief Narrative:    Medical records reviewed and are as summarized below:  Melissa [name] is a 87 y.o. female with medical history significant for iron deficiency anemia who presented to the hospital after an accidental fall.  She was found to have right hip fracture.  She was treated with analgesics.      Assessment/Plan:   Principal Problem:   Closed intertrochanteric fracture of hip, right, initial encounter ([place]) Active Problems:   ANEMIA, IRON DEFICIENCY   Accidental fall   Nutrition Problem: Increased nutrient needs Etiology: hip fracture  Signs/Symptoms: estimated needs   Body mass index is 18.88 kg/m.   Closed right intertrochanteric hip fracture, s/p fall: S/p intramedullary fixation of right intertrochanteric hip fracture on 07/15/2020.  Continue analgesics for pain.  Continue PT and OT.  Follow-up with orthopedic surgeon.  Acute hypoxia: Resolved.  She is tolerating room air.  Acute blood loss anemia, chronic iron deficiency anemia: Recommended 1 unit of blood transfusion.  However, her husband said they prefer to hold off on blood transfusion.  Monitor H&H.  Heart murmur and cardiomegaly on chest x-ray.  2D echo has been ordered for further evaluation.    Diet Order            Diet regular Room service appropriate? Yes; Fluid consistency: Thin  Diet effective now                    Consultants:  Orthopedic surgeon  Procedures:  Right hip surgery    Medications:   . acetaminophen  500 mg Oral Q6H  . cholecalciferol  1,000 Units Oral Daily  . docusate sodium  100 mg Oral BID  . enoxaparin (LOVENOX) injection  40 mg Subcutaneous Q24H  . ferrous sulfate  325 mg Oral TID PC  . multivitamin with minerals  1 tablet Oral Daily  . saccharomyces boulardii  250 mg Oral Daily  . senna  1 tablet Oral BID  . traMADol  50 mg Oral  Q6H   Continuous Infusions: . lactated ringers Stopped (07/15/20 2253)  . methocarbamol (ROBAXIN) IV    . promethazine (PHENERGAN) injection (IM or IVPB)       Anti-infectives (From admission, onward)   Start     Dose/Rate Route Frequency Ordered Stop   07/15/20 1700  ceFAZolin (ANCEF) IVPB 2g/100 mL premix        2 g 200 mL/hr over 30 Minutes Intravenous Every 6 hours 07/15/20 1611 07/15/20 2315   07/15/20 1431  gentamicin 80 mg in 0.9% sodium chloride 250 mL irrigation  Status:  Discontinued          As needed 07/15/20 1432 07/15/20 1453   07/15/20 1000  ceFAZolin (ANCEF) IVPB 2g/100 mL premix        2 g 200 mL/hr over 30 Minutes Intravenous 30 min pre-op 07/14/20 2042 07/15/20 1338             Family Communication/Anticipated D/C date and plan/Code Status   DVT prophylaxis: enoxaparin (LOVENOX) injection 40 mg Start: 07/16/20 0800 SCDs Start: 07/15/20 1612 Place TED hose Start: 07/15/20 1612 Place and maintain sequential compression device Start: 07/14/20 2224 SCDs Start: 07/14/20 2050     Code Status: DNR  Family Communication: None Disposition Plan:    Status is: Inpatient  Remains inpatient appropriate because:Inpatient level of care appropriate  due to severity of illness   Dispo: The patient is from: Home              Anticipated d/c is to: SNF              Patient currently is not medically stable to d/c.   Difficult to place patient No           Subjective:   C/o pain in the right hip and fatigue.  Her husband was at the bedside.  Objective:    Vitals:   07/15/20 2045 07/16/20 0004 07/16/20 0439 07/16/20 0839  BP: (!) 107/53 (!) 105/58 (!) 110/51 (!) 118/56  Pulse: 93 83 77 86  Resp: 17 18 17 18   Temp: 97.8 F (36.6 C) 97.8 F (36.6 C) (!) 97.3 F (36.3 C) 97.9 F (36.6 C)  TempSrc:   Oral   SpO2: 91% 92% 95% 91%  Weight:      Height:       No data found.   Intake/Output Summary (Last 24 hours) at 07/16/2020 1257 Last  data filed at 07/16/2020 1028 Gross per 24 hour  Intake 660 ml  Output 655 ml  Net 5 ml   Filed Weights   07/14/20 1856 07/15/20 1208  Weight: 49.9 kg 49.9 kg    Exam:  GEN: NAD SKIN: Warm and dry EYES: EOMI ENT: MMM CV: RRR PULM: CTA B ABD: soft, ND, NT, +BS CNS: AAO x 3, non focal EXT: Right hip tenderness.  Dressing on the right hip surgical wound looks clean, dry and intact          Data Reviewed:   I have personally reviewed following labs and imaging studies:  Labs: Labs show the following:   Basic Metabolic Panel: Recent Labs  Lab 07/14/20 1919 07/16/20 0614  NA 137 132*  K 4.3 3.8  CL 103 99  CO2 23 26  GLUCOSE 148* 122*  BUN 26* 23  CREATININE 0.71 0.63  CALCIUM 8.8* 7.9*   GFR Estimated Creatinine Clearance: 37.6 mL/min (by C-G formula based on SCr of 0.63 mg/dL). Liver Function Tests: Recent Labs  Lab 07/14/20 1919  AST 33  ALT 23  ALKPHOS 64  BILITOT 1.1  PROT 6.7  ALBUMIN 4.1   No results for input(s): LIPASE, AMYLASE in the last 168 hours. No results for input(s): AMMONIA in the last 168 hours. Coagulation profile Recent Labs  Lab 07/14/20 1919  INR 1.1    CBC: Recent Labs  Lab 07/14/20 1919 07/16/20 0614  WBC 12.7* 10.3  NEUTROABS 7.9*  --   HGB 9.2* 7.7*  HCT 27.0* 22.4*  MCV 98.2 98.2  PLT 371 293   Cardiac Enzymes: No results for input(s): CKTOTAL, CKMB, CKMBINDEX, TROPONINI in the last 168 hours. BNP (last 3 results) No results for input(s): PROBNP in the last 8760 hours. CBG: No results for input(s): GLUCAP in the last 168 hours. D-Dimer: No results for input(s): DDIMER in the last 72 hours. Hgb A1c: No results for input(s): HGBA1C in the last 72 hours. Lipid Profile: No results for input(s): CHOL, HDL, LDLCALC, TRIG, CHOLHDL, LDLDIRECT in the last 72 hours. Thyroid function studies: No results for input(s): TSH, T4TOTAL, T3FREE, THYROIDAB in the last 72 hours.  Invalid input(s): FREET3 Anemia work  up: No results for input(s): VITAMINB12, FOLATE, FERRITIN, TIBC, IRON, RETICCTPCT in the last 72 hours. Sepsis Labs: Recent Labs  Lab 07/14/20 1919 07/14/20 2221 07/16/20 4818  PROCALCITON  --  <0.10  --  WBC 12.7*  --  10.3    Microbiology Recent Results (from the past 240 hour(s))  Resp Panel by RT-PCR (Flu A&B, Covid) Nasopharyngeal Swab     Status: None   Collection Time: 07/14/20  7:53 PM   Specimen: Nasopharyngeal Swab; Nasopharyngeal(NP) swabs in vial transport medium  Result Value Ref Range Status   SARS Coronavirus 2 by RT PCR NEGATIVE NEGATIVE Final    Comment: (NOTE) SARS-CoV-2 target nucleic acids are NOT DETECTED.  The SARS-CoV-2 RNA is generally detectable in upper respiratory specimens during the acute phase of infection. The lowest concentration of SARS-CoV-2 viral copies this assay can detect is 138 copies/mL. A negative result does not preclude SARS-Cov-2 infection and should not be used as the sole basis for treatment or other patient management decisions. A negative result may occur with  improper specimen collection/handling, submission of specimen other than nasopharyngeal swab, presence of viral mutation(s) within the areas targeted by this assay, and inadequate number of viral copies(<138 copies/mL). A negative result must be combined with clinical observations, patient history, and epidemiological information. The expected result is Negative.  Fact Sheet for Patients:  EntrepreneurPulse.com.au  Fact Sheet for Healthcare Providers:  IncredibleEmployment.be  This test is no t yet approved or cleared by the [place] FDA and  has been authorized for detection and/or diagnosis of SARS-CoV-2 by FDA under an Emergency Use Authorization (EUA). This EUA will remain  in effect (meaning this test can be used) for the duration of the COVID-19 declaration under Section 564(b)(1) of the Act, 21 U.S.C.section  360bbb-3(b)(1), unless the authorization is terminated  or revoked sooner.       Influenza A by PCR NEGATIVE NEGATIVE Final   Influenza B by PCR NEGATIVE NEGATIVE Final    Comment: (NOTE) The Xpert Xpress SARS-CoV-2/FLU/RSV plus assay is intended as an aid in the diagnosis of influenza from Nasopharyngeal swab specimens and should not be used as a sole basis for treatment. Nasal washings and aspirates are unacceptable for Xpert Xpress SARS-CoV-2/FLU/RSV testing.  Fact Sheet for Patients: EntrepreneurPulse.com.au  Fact Sheet for Healthcare Providers: IncredibleEmployment.be  This test is not yet approved or cleared by the [place] FDA and has been authorized for detection and/or diagnosis of SARS-CoV-2 by FDA under an Emergency Use Authorization (EUA). This EUA will remain in effect (meaning this test can be used) for the duration of the COVID-19 declaration under Section 564(b)(1) of the Act, 21 U.S.C. section 360bbb-3(b)(1), unless the authorization is terminated or revoked.  Performed at [facility], [address] [place], [place] [address]     Procedures and diagnostic studies:  DG Chest 1 View  Result Date: 07/14/2020 CLINICAL DATA:  Hip fracture EXAM: CHEST  1 VIEW COMPARISON:  06/25/2017 FINDINGS: Right shoulder replacement. No pleural effusion or pneumothorax. Hazy right lung base opacity. Mild cardiomegaly. No pneumothorax. IMPRESSION: 1. Hazy right lung base opacity which may be due to atelectasis or mild pneumonia 2. Mild cardiomegaly Electronically Signed   By: Donavan Foil M.D.   On: 07/14/2020 19:58   DG HIP OPERATIVE UNILAT W OR W/O PELVIS RIGHT  Result Date: 07/15/2020 CLINICAL DATA:  Right hip long nail. EXAM: OPERATIVE RIGHT HIP (WITH PELVIS IF PERFORMED) TECHNIQUE: Fluoroscopic spot image(s) were submitted for interpretation post-operatively. COMPARISON:  Preoperative radiograph yesterday. FINDINGS: Eight  fluoroscopic spot views of the right hip obtained in the operating room. Intramedullary nail with trans trochanteric and distal locking screw fixation of intertrochanteric femur fracture. Improved fracture alignment. Fluoroscopy time  1 minutes 27 seconds. IMPRESSION: Procedural fluoroscopy during right proximal femur fracture fixation. Electronically Signed   By: Keith Rake M.D.   On: 07/15/2020 15:17   DG Hip Unilat With Pelvis 2-3 Views Right  Result Date: 07/14/2020 CLINICAL DATA:  Fall with deformity EXAM: DG HIP (WITH OR WITHOUT PELVIS) 2-3V RIGHT COMPARISON:  07/31/2013 FINDINGS: Post treatment changes of the sacrum. Pubic symphysis and rami appear intact. Both femoral heads project in joint. Acute comminuted and displaced right intertrochanteric fracture. Mild apex anterior angulation. IMPRESSION: Acute comminuted and displaced right intertrochanteric fracture Electronically Signed   By: Donavan Foil M.D.   On: 07/14/2020 19:57   DG FEMUR PORT, MIN 2 VIEWS RIGHT  Result Date: 07/15/2020 CLINICAL DATA:  Postop. EXAM: RIGHT FEMUR PORTABLE 2 VIEW COMPARISON:  Preoperative hip radiograph yesterday. FINDINGS: Intramedullary nail with distal locking and trans trochanteric screw fixation of intertrochanteric right femur fracture. Improved fracture alignment from preoperative exam. No new fracture. Recent postsurgical change includes air and edema in the soft tissues with lateral skin staples. IMPRESSION: ORIF of intertrochanteric right femur fracture. No immediate postoperative complication. Electronically Signed   By: Keith Rake M.D.   On: 07/15/2020 15:59               LOS: 2 days   Anatole Apollo  [facility]   Pager on [email or web address]. If 7PM-7AM, please contact night-coverage at [email or web address]     07/16/2020, 12:57 PM

## 2020-07-16 NOTE — Evaluation (Signed)
Physical Therapy Evaluation Patient Details Name: Melissa [name] MRN: [id number] DOB: January 11, 1929 Today's Date: 07/16/2020   History of Present Illness  Pt is an 87 y/o female who presents to [facility] following a fall at home on 07/14/20 resuliting in a R hip fracture. Pt is s/p intramedullary fixation of R intertrochanteric hip fracure. PMH: arthritis, chest pain, GERD, heart murmur, HA migraine, iron deficiecy and PONV.  Clinical Impression  Pt received by therapy supine in bed, slightly agitated, only oriented to self and reporting fatigue. Pt reports no pain when she is not moving her leg, but demonstrated some anxiety associated with moving her legs. Patient completed supine to sit transfer with max A, multiple verbal and tactile cues for sequencing and hand placement. Extra time need to complete transfer safely. Pt able to sit EOB for ~2 minutes with BIL UE supported and verbal cues for appropriate hand placement from therapist. Completed stand pivot transfer to recliner with pt with max A from therapist and cues for hand placement and sequencing. Pt demonstrated visual signs of increased pain in the R hip, that decreased once patient was in the reclined position. Completed ankle pumps x20 and discussed completing ankle pumps multiple times throughout the day to improve LE blood flow. Pt demonstrated increased fatigue following transfer and requested some rest. Pt left seated in recliner with chair alarm set, call bell in reach and husband at bedside.       Follow Up Recommendations SNF    Equipment Recommendations  None recommended by PT    Recommendations for Other Services       Precautions / Restrictions Precautions Precautions: Fall Restrictions Weight Bearing Restrictions: Yes RLE Weight Bearing: Weight bearing as tolerated      Mobility  Bed Mobility Overal bed mobility: Needs Assistance Bed Mobility: Supine to Sit     Supine to sit: Max assist     General bed mobility  comments: Pt required max A to complete transfer to sit EOB, she required A to mobilize BIL LE. Patient Response: Anxious  Transfers Overall transfer level: Needs assistance   Transfers: Stand Pivot Transfers   Stand pivot transfers: Max assist       General transfer comment: Pt required max A +1 to complete stand pivot transfer to sit in recliner  Ambulation/Gait                Stairs            Wheelchair Mobility    Modified Rankin (Stroke Patients Only)       Balance Overall balance assessment: Needs assistance Sitting-balance support: Bilateral upper extremity supported Sitting balance-Leahy Scale: Fair Sitting balance - Comments: Pt with decreased safety awareness required tactile cues for appopriate hand placement on the bed in order to sit EOB safely Postural control: Right lateral lean   Standing balance-Leahy Scale: Zero Standing balance comment: Pt currently unable to balance in the standing position requiring max A to stand safely 2/2 pain and strength deficits in LE                             Pertinent Vitals/Pain Pain Assessment: Faces Faces Pain Scale: Hurts whole lot Pain Location: R hip Pain Descriptors / Indicators: Aching;Dull Pain Intervention(s): Repositioned;Monitored during session;Patient requesting pain meds-RN notified    Home Living Family/patient expects to be discharged to:: Private residence Living Arrangements: Spouse/significant other Available Help at Discharge: Family;Available 24 hours/day (Pt lives with her  hsb) Type of Home: Independent living facility (Pt lives in [place]) Home Access: Level entry     Home Layout: [place]: Environmental consultant - 2 wheels;Shower seat      Prior Function Level of Independence: Independent         Comments: Mod I using RW for all ambulation, hsb was washing her while she sat on shower seat     Hand Dominance   Dominant Hand: Right    Extremity/Trunk  Assessment        Lower Extremity Assessment Lower Extremity Assessment: RLE deficits/detail RLE Deficits / Details: Decreased RLE strength and ROM 2/2 to recent surgical procedure RLE Coordination: decreased gross motor    Cervical / Trunk Assessment Cervical / Trunk Assessment: Kyphotic  Communication   Communication: No difficulties  Cognition Arousal/Alertness: Lethargic Behavior During Therapy: Anxious Overall Cognitive Status: Impaired/Different from baseline Area of Impairment: Safety/judgement;Orientation                         Safety/Judgement: Decreased awareness of safety     General Comments: Pt anxious and only oriented to self      General Comments      Exercises Total Joint Exercises Ankle Circles/Pumps: AROM;Both;20 reps   Assessment/Plan    PT Assessment Patient needs continued PT services  PT Problem List Decreased strength;Decreased mobility;Decreased safety awareness;Decreased range of motion;Decreased activity tolerance;Decreased knowledge of precautions;Decreased balance;Decreased knowledge of use of DME;Pain;Impaired sensation       PT Treatment Interventions DME instruction;Therapeutic activities;Gait training;Therapeutic exercise;Stair training;Balance training;Functional mobility training    PT Goals (Current goals can be found in the Care Plan section)  Acute Rehab PT Goals Patient Stated Goal: Decrease hip pain PT Goal Formulation: With patient/family Time For Goal Achievement: 07/29/20 Potential to Achieve Goals: Fair    Frequency BID   Barriers to discharge        Co-evaluation               AM-PAC PT "6 Clicks" Mobility  Outcome Measure Help needed turning from your back to your side while in a flat bed without using bedrails?: A Lot Help needed moving from lying on your back to sitting on the side of a flat bed without using bedrails?: A Lot   Help needed standing up from a chair using your arms (e.g.,  wheelchair or bedside chair)?: A Lot Help needed to walk in hospital room?: Total Help needed climbing 3-5 steps with a railing? : Total 6 Click Score: 8    End of Session Equipment Utilized During Treatment: Gait belt Activity Tolerance: Patient limited by pain;Patient limited by fatigue Patient left: in chair;with call bell/phone within reach;with chair alarm set Nurse Communication: Mobility status;Patient requests pain meds PT Visit Diagnosis: Unsteadiness on feet (R26.81);Muscle weakness (generalized) (M62.81);Repeated falls (R29.6)    Time: 6381-7711 PT Time Calculation (min) (ACUTE ONLY): 31 min   Charges:   PT Evaluation $PT Eval Moderate Complexity: 1 Mod PT Treatments $Therapeutic Activity: 8-22 mins        Duanne Guess, PT, DPT 07/16/20, 11:00 AM   Isaias Cowman 07/16/2020, 10:52 AM

## 2020-07-16 NOTE — Evaluation (Signed)
Occupational Therapy Evaluation Patient Details Name: Melissa [name] MRN: [id number] DOB: Jul 13, 1928 Today's Date: 07/16/2020    History of Present Illness Pt is an 87 y/o female who [facility] following a fall at home on 07/14/20 resuliting in a R hip fracture. Pt is s/p intramedullary fixation of R intertrochanteric hip fracure. PMH: arthritis, chest pain, GERD, heart murmur, HA migraine, iron deficiecy and PONV.   Clinical Impression   Pt seen for OT evaluation this date in setting of acute hospitalization d/t fall now s/p R hip IM nail. Pt's spouse provides most PLOF information as pt presents with decreased cognitive status. Pt's spouse reports that [place] and he performs most IADLs while pt is typically able to perform most of her basic self care except bathing d/t safety. Pt presents this date with post-fall and post-op R hip pain superimposed on underlying weakness, impacting her ability to safely and efficiently perform ADLs/ADL mobility. On ADL assessment, pt requires MOD/MAX A with bed mobility, MIN/MOD A for unsupported sitting ADLs including oral care d/t poor static sitting balance and sequencing of task 2/2 decreased motor planning/cognitive status. Requires MAX/TOTAL A for seated LB ADLs at this time 2/2 pain. Standing deferred on OT treatment as pt falling asleep while seated EOB after engaging in one ADL and one therex task. Pt requires MAX A to return to supine position. Pt left with bed alarm set, spouse present and all needs met and in reach, RN notified that pt had not consumed any dinner and may require nutritional drink. Will continue to follow acutely and anticipate pt will require STR f/u OT services in SNF setting upon d/c from hospital to improve strength and safety with self care.    Follow Up Recommendations  SNF    Equipment Recommendations  Other (comment) (defer to next level of care)    Recommendations for Other Services        Precautions / Restrictions Precautions Precautions: Fall Restrictions Weight Bearing Restrictions: Yes RLE Weight Bearing: Weight bearing as tolerated      Mobility Bed Mobility Overal bed mobility: Needs Assistance Bed Mobility: Supine to Sit;Sit to Supine     Supine to sit: Mod assist;Max assist;HOB elevated Sit to supine: Max assist   General bed mobility comments: increased time, cues, HOB elevated, use of rails, use of draw sheet to come to sitting and back to bed to manage trunk and LEs 2/2 pain    Transfers     General transfer comment: deferred as pt with poor sitting balance and falling asleep sitting up after performing ADL in sitting and therex.    Balance Overall balance assessment: Needs assistance Sitting-balance support: Bilateral upper extremity supported Sitting balance-Leahy Scale: Poor Sitting balance - Comments: requires UE support and initially able to sustain static sitting with F balance, but gradually demos posterolateral lean to R side and requires assist to return to midline, short bouts of sitting with CGA/SBA, but primarily requires MIN A to sustain static sitting this session Postural control: Right lateral lean;Posterior lean Standing balance comment: deferred                   ADL either performed or assessed with clinical judgement   ADL Overall ADL's : Needs assistance/impaired                             General ADL Comments: MOD/MAX A with bed mobility,  MIN/MOD A for unsupported sitting ADLs including oral care d/t poor static sitting balance and sequencing of task 2/2 decreased motor planning/cognitive status. Requires MAX/TOTAL A for seated LB ADLs at this time 2/2 pain.     Vision Baseline Vision/History: Wears glasses Wears Glasses: At all times Patient Visual Report: No change from baseline       Perception     Praxis      Pertinent Vitals/Pain Pain Assessment: Faces Faces Pain Scale: Hurts whole  lot Pain Location: R hip with mobilization, otherwise pt resting comfortably. Pain Descriptors / Indicators: Grimacing;Guarding Pain Intervention(s): Limited activity within patient's tolerance;Monitored during session;Repositioned;Premedicated before session     Hand Dominance Right   Extremity/Trunk Assessment Upper Extremity Assessment Upper Extremity Assessment: Generalized weakness   Lower Extremity Assessment Lower Extremity Assessment: RLE deficits/detail;LLE deficits/detail RLE Deficits / Details: decreased toleranec for R hip ROM and mobilization r/t getting into/OOB LLE Deficits / Details: ROM somewhat limited for hip flexion as it pertains to LB ADLs, but overall functional given age.   Cervical / Trunk Assessment Cervical / Trunk Assessment: Kyphotic   Communication Communication Communication: No difficulties   Cognition Arousal/Alertness: Awake/alert (drowsy, attends but falls asleep easily) Behavior During Therapy: WFL for tasks assessed/performed Overall Cognitive Status: Impaired/Different from baseline Area of Impairment: Safety/judgement;Orientation;Following commands;Memory;Problem solving                 Orientation Level: Disoriented to;Situation;Place;Time   Memory: Decreased short-term memory Following Commands: Follows one step commands with increased time Safety/Judgement: Decreased awareness of safety;Decreased awareness of deficits   Problem Solving: Requires verbal cues;Requires tactile cues;Difficulty sequencing General Comments: pt requires increased time and cues for all tasks performed. Benefits from visual and tactile cues to demonstrate than just verbal d/t decreased motor planning skills.   General Comments       Exercises Other Exercises: OT ed with pt's spouse re: role of OT in acute setting. OT engages pt in seated oral care task with MIN/MOD A for seated support when she is using UEs for a task rather than balance and MIN A for  actual task to sequence and hold items such as wash cloth. Other Exercises: because of noted core weakness in sitting, OT engages pt in contralateral reaching x5 per side with moderate verbal and visual cues for form, pace, technique.   Shoulder Instructions      Home Living Family/patient expects to be discharged to:: Private residence Living Arrangements: Spouse/significant other Available Help at Discharge: Family;Available 24 hours/day (pt lives with spouse at [facility]) Type of Home: Independent living facility [facility]) Home Access: Level entry     Home Layout: One level     Bathroom Shower/Tub: Walk-in Psychologist, prison and probation services: Handicapped height Bathroom Accessibility: Yes   Home Equipment: Environmental consultant - 2 wheels;Shower seat;Walker - 4 wheels;Grab bars - tub/shower          Prior Functioning/Environment Level of Independence: Independent;Needs assistance  Gait / Transfers Assistance Needed: MOD I with RW for fxl mobility ADL's / Homemaking Assistance Needed: uses shower seat to bathe, pt's spouse reports that he has to shower with her for safety and helps wash her, but has her do most herself. Husband performs most IADLs.   Comments: Pt's spouse endorses recent balance instability seen in patient, several near misses leading up to this fall.        OT Problem List: Decreased strength;Decreased range of motion;Decreased activity tolerance;Impaired balance (sitting and/or standing);Decreased cognition;Decreased safety awareness;Decreased knowledge of  use of DME or AE;Pain      OT Treatment/Interventions: Self-care/ADL training;DME and/or AE instruction;Therapeutic activities;Balance training;Therapeutic exercise;Patient/family education    OT Goals(Current goals can be found in the care plan section) Acute Rehab OT Goals Patient Stated Goal: Decrease hip pain OT Goal Formulation: With patient/family Time For Goal Achievement: 07/30/20 Potential to Achieve Goals:  Fair ADL Goals Pt Will Perform Grooming: with min assist;standing (to completet 1 g/h tasks wtih RW sink-side with MIN A for balance support to increase standing tolerance) Pt Will Perform Lower Body Dressing: with min assist;sit to/from stand (with LRAD/AE PRN) Pt Will Transfer to Toilet: with min assist;stand pivot transfer;bedside commode Pt Will Perform Toileting - Clothing Manipulation and hygiene: with min assist;sit to/from stand Pt/caregiver will Perform Home Exercise Program: Increased strength;Both right and left upper extremity;With minimal assist  OT Frequency: Min 2X/week   Barriers to D/C:            Co-evaluation              AM-PAC OT "6 Clicks" Daily Activity     Outcome Measure Help from another person eating meals?: A Little Help from another person taking care of personal grooming?: A Little Help from another person toileting, which includes using toliet, bedpan, or urinal?: A Lot Help from another person bathing (including washing, rinsing, drying)?: A Lot Help from another person to put on and taking off regular upper body clothing?: A Lot Help from another person to put on and taking off regular lower body clothing?: A Lot 6 Click Score: 14   End of Session Nurse Communication: Other (comment) (notified RN that pt did not consume any dinner and may need and ensure for protein/calories.)  Activity Tolerance: Patient limited by fatigue;Patient limited by pain Patient left: in bed;with call bell/phone within reach;with bed alarm set;with family/visitor present  OT Visit Diagnosis: Unsteadiness on feet (R26.81);Muscle weakness (generalized) (M62.81);History of falling (Z91.81);Pain Pain - Right/Left: Right Pain - part of body: Hip                Time: 1710-1748 OT Time Calculation (min): 38 min Charges:  OT General Charges $OT Visit: 1 Visit OT Evaluation $OT Eval Moderate Complexity: 1 Mod OT Treatments $Self Care/Home Management : 8-22  mins $Therapeutic Activity: 8-22 mins  Gerrianne Scale, MS, OTR/L ascom [phone] 07/16/20, 6:10 PM

## 2020-07-17 ENCOUNTER — Encounter: Payer: Self-pay | Admitting: Orthopedic Surgery

## 2020-07-17 DIAGNOSIS — D508 Other iron deficiency anemias: Secondary | ICD-10-CM

## 2020-07-17 DIAGNOSIS — S72141A Displaced intertrochanteric fracture of right femur, initial encounter for closed fracture: Secondary | ICD-10-CM | POA: Diagnosis not present

## 2020-07-17 LAB — CBC
HCT: 18.9 % — ABNORMAL LOW (ref 36.0–46.0)
Hemoglobin: 6.4 g/dL — ABNORMAL LOW (ref 12.0–15.0)
MCH: 33.7 pg (ref 26.0–34.0)
MCHC: 33.9 g/dL (ref 30.0–36.0)
MCV: 99.5 fL (ref 80.0–100.0)
Platelets: 291 10*3/uL (ref 150–400)
RBC: 1.9 MIL/uL — ABNORMAL LOW (ref 3.87–5.11)
RDW: 18.3 % — ABNORMAL HIGH (ref 11.5–15.5)
WBC: 11.7 10*3/uL — ABNORMAL HIGH (ref 4.0–10.5)
nRBC: 0.9 % — ABNORMAL HIGH (ref 0.0–0.2)

## 2020-07-17 LAB — BASIC METABOLIC PANEL
Anion gap: 7 (ref 5–15)
BUN: 25 mg/dL — ABNORMAL HIGH (ref 8–23)
CO2: 26 mmol/L (ref 22–32)
Calcium: 8.2 mg/dL — ABNORMAL LOW (ref 8.9–10.3)
Chloride: 102 mmol/L (ref 98–111)
Creatinine, Ser: 0.63 mg/dL (ref 0.44–1.00)
GFR, Estimated: >60 mL/min (ref >60)
Glucose, Bld: 124 mg/dL — ABNORMAL HIGH (ref 70–99)
Potassium: 4.3 mmol/L (ref 3.5–5.1)
Sodium: 135 mmol/L (ref 135–145)

## 2020-07-17 LAB — PREPARE RBC (CROSSMATCH)

## 2020-07-17 LAB — ABO/RH: ABO/RH(D): O POS

## 2020-07-17 MED ORDER — SODIUM CHLORIDE 0.9% IV SOLUTION: Freq: Once | INTRAVENOUS | Status: AC

## 2020-07-17 NOTE — Progress Notes (Signed)
Subjective:  Patient reports pain as relatively well controlled.  Objective:   VITALS:   Vitals:   07/16/20 1553 07/16/20 2012 07/17/20 0531 07/17/20 0838  BP: (!) 117/48 (!) 129/54 (!) 132/56 120/67  Pulse: 83 85 84 88  Resp: 16 15 16 16   Temp: 97.9 F (36.6 C) 97.6 F (36.4 C) 98 F (36.7 C) 98.7 F (37.1 C)  TempSrc:  Oral    SpO2: 91% 93% 95% 96%  Weight:      Height:        PHYSICAL EXAM:  General: comfortable, confused RLE: incisions clean, dry, intact; RLE is NVI   LABS  Results for orders placed or performed during the hospital encounter of 07/14/20 (from the past 24 hour(s))  CBC     Status: Abnormal   Collection Time: 07/17/20  4:46 AM  Result Value Ref Range   WBC 11.7 (H) 4.0 - 10.5 K/uL   RBC 1.90 (L) 3.87 - 5.11 MIL/uL   Hemoglobin 6.4 (L) 12.0 - 15.0 g/dL   HCT 18.9 (L) 36.0 - 46.0 %   MCV 99.5 80.0 - 100.0 fL   MCH 33.7 26.0 - 34.0 pg   MCHC 33.9 30.0 - 36.0 g/dL   RDW 18.3 (H) 11.5 - 15.5 %   Platelets 291 150 - 400 K/uL   nRBC 0.9 (H) 0.0 - 0.2 %  Basic metabolic panel     Status: Abnormal   Collection Time: 07/17/20  4:46 AM  Result Value Ref Range   Sodium 135 135 - 145 mmol/L   Potassium 4.3 3.5 - 5.1 mmol/L   Chloride 102 98 - 111 mmol/L   CO2 26 22 - 32 mmol/L   Glucose, Bld 124 (H) 70 - 99 mg/dL   BUN 25 (H) 8 - 23 mg/dL   Creatinine, Ser 0.63 0.44 - 1.00 mg/dL   Calcium 8.2 (L) 8.9 - 10.3 mg/dL   GFR, Estimated >60 >60 mL/min   Anion gap 7 5 - 15  Prepare RBC (crossmatch)     Status: None (Preliminary result)   Collection Time: 07/17/20  9:23 AM  Result Value Ref Range   Order Confirmation PENDING     DG HIP OPERATIVE UNILAT W OR W/O PELVIS RIGHT  Result Date: 07/15/2020 CLINICAL DATA:  Right hip long nail. EXAM: OPERATIVE RIGHT HIP (WITH PELVIS IF PERFORMED) TECHNIQUE: Fluoroscopic spot image(s) were submitted for interpretation post-operatively. COMPARISON:  Preoperative radiograph yesterday. FINDINGS: Eight fluoroscopic  spot views of the right hip obtained in the operating room. Intramedullary nail with trans trochanteric and distal locking screw fixation of intertrochanteric femur fracture. Improved fracture alignment. Fluoroscopy time 1 minutes 27 seconds. IMPRESSION: Procedural fluoroscopy during right proximal femur fracture fixation. Electronically Signed   By: Keith Rake M.D.   On: 07/15/2020 15:17   DG FEMUR PORT, MIN 2 VIEWS RIGHT  Result Date: 07/15/2020 CLINICAL DATA:  Postop. EXAM: RIGHT FEMUR PORTABLE 2 VIEW COMPARISON:  Preoperative hip radiograph yesterday. FINDINGS: Intramedullary nail with distal locking and trans trochanteric screw fixation of intertrochanteric right femur fracture. Improved fracture alignment from preoperative exam. No new fracture. Recent postsurgical change includes air and edema in the soft tissues with lateral skin staples. IMPRESSION: ORIF of intertrochanteric right femur fracture. No immediate postoperative complication. Electronically Signed   By: Keith Rake M.D.   On: 07/15/2020 15:59    Assessment/Plan: 2 Days Post-Op   Principal Problem:   Closed intertrochanteric fracture of hip, right, initial encounter [facility]) Active Problems:   ANEMIA,  IRON DEFICIENCY   Accidental fall  - PT/OT: WBAT RLE - Continue DVT ppx- lovenox - Dressing change per nursing to keep dressing clean and dry - Appreciate hospitalist team support - Likely dispo to SNF pending case management. Follow-up with [place], MD in 2 weeks.   Renee Harder , MD 07/17/2020, 9:35 AM

## 2020-07-17 NOTE — Progress Notes (Signed)
OT Cancellation Note  Patient Details Name: Melissa [name] MRN: [id number] DOB: 10-03-1928   Cancelled Treatment:    Reason Eval/Treat Not Completed: Medical issues which prohibited therapy  Pt with Hgb 6.4 this AM. Will hold OT at this time and f/u as pt becomes more appropriate for participation. Thank you.  Gerrianne Scale, [place], OTR/L ascom [phone] 07/17/20, 9:03 AM

## 2020-07-17 NOTE — Progress Notes (Signed)
Progress Note    Melissa [name]  FFM:[id number] DOB: 1928/08/23  DOA: 07/14/2020 PCP: [name], MD      Brief Narrative:    Medical records reviewed and are as summarized below:  Melissa [name] is a 87 y.o. female with medical history significant for iron deficiency anemia who presented to the hospital after an accidental fall.  She was found to have right hip fracture.  She was treated with analgesics.      Assessment/Plan:   Principal Problem:   Closed intertrochanteric fracture of hip, right, initial encounter ([place]) Active Problems:   ANEMIA, IRON DEFICIENCY   Accidental fall   Nutrition Problem: Increased nutrient needs Etiology: hip fracture  Signs/Symptoms: estimated needs   Body mass index is 18.88 kg/m.   Closed right intertrochanteric hip fracture, s/p fall: S/p intramedullary fixation of right intertrochanteric hip fracture on 07/15/2020.  Analgesics as needed for pain.  Continue PT and OT.  Follow-up with orthopedic surgeon.  Acute hypoxia: Resolved.  She is tolerating room air.  Acute blood loss anemia, chronic iron deficiency anemia: Transfuse 1 unit of packed red blood cells.  Monitor H&H.  Patient and her husband are agreeable to blood transfusion  Heart murmur and cardiomegaly on chest x-ray.  Patient refused to have 2D echo.  Her husband said murmur has been present for a very long time and they recently had their annual physical exam with PCP who gave him a clean bill of health.  Diet Order            Diet regular Room service appropriate? Yes; Fluid consistency: Thin  Diet effective now                    Consultants:  Orthopedic surgeon  Procedures:  Right hip surgery    Medications:   . sodium chloride   Intravenous Once  . cholecalciferol  1,000 Units Oral Daily  . docusate sodium  100 mg Oral BID  . enoxaparin (LOVENOX) injection  40 mg Subcutaneous Q24H  . ferrous sulfate  325 mg Oral TID PC  . multivitamin  with minerals  1 tablet Oral Daily  . saccharomyces boulardii  250 mg Oral Daily  . senna  1 tablet Oral BID  . traMADol  50 mg Oral Q6H   Continuous Infusions: . lactated ringers Stopped (07/15/20 2253)  . methocarbamol (ROBAXIN) IV    . promethazine (PHENERGAN) injection (IM or IVPB)       Anti-infectives (From admission, onward)   Start     Dose/Rate Route Frequency Ordered Stop   07/15/20 1700  ceFAZolin (ANCEF) IVPB 2g/100 mL premix        2 g 200 mL/hr over 30 Minutes Intravenous Every 6 hours 07/15/20 1611 07/15/20 2315   07/15/20 1431  gentamicin 80 mg in 0.9% sodium chloride 250 mL irrigation  Status:  Discontinued          As needed 07/15/20 1432 07/15/20 1453   07/15/20 1000  ceFAZolin (ANCEF) IVPB 2g/100 mL premix        2 g 200 mL/hr over 30 Minutes Intravenous 30 min pre-op 07/14/20 2042 07/15/20 1338             Family Communication/Anticipated D/C date and plan/Code Status   DVT prophylaxis: enoxaparin (LOVENOX) injection 40 mg Start: 07/16/20 0800 SCDs Start: 07/15/20 1612 Place TED hose Start: 07/15/20 1612 Place and maintain sequential compression device Start: 07/14/20 2224 SCDs Start: 07/14/20 2050  Code Status: DNR  Family Communication: None Disposition Plan:    Status is: Inpatient  Remains inpatient appropriate because:Inpatient level of care appropriate due to severity of illness   Dispo: The patient is from: Home              Anticipated d/c is to: SNF              Patient currently is not medically stable to d/c.   Difficult to place patient No           Subjective:   Interval events noted.  She complains of pain in the right hip, generalized weakness and fatigue.  No shortness of breath or chest pain.  Objective:    Vitals:   07/17/20 0531 07/17/20 0838 07/17/20 1023 07/17/20 1044  BP: (!) 132/56 120/67 (!) 119/57 (!) 122/51  Pulse: 84 88 86 86  Resp: 16 16 18 16   Temp: 98 F (36.7 C) 98.7 F (37.1 C) 98.4  F (36.9 C) 98.9 F (37.2 C)  TempSrc:   Oral Oral  SpO2: 95% 96% 94% 94%  Weight:      Height:       No data found.   Intake/Output Summary (Last 24 hours) at 07/17/2020 1102 Last data filed at 07/17/2020 0550 Gross per 24 hour  Intake 560 ml  Output 100 ml  Net 460 ml   Filed Weights   07/14/20 1856 07/15/20 1208  Weight: 49.9 kg 49.9 kg    Exam:   GEN: NAD SKIN: Warm and dry EYES: Pale but anicteric ENT: MMM CV: RRR PULM: CTA B ABD: soft, ND, NT, +BS CNS: AAO x 2 (person and place), non focal EXT: Mild right hip tenderness.  Dressing on right hip is dry and intact.        Data Reviewed:   I have personally reviewed following labs and imaging studies:  Labs: Labs show the following:   Basic Metabolic Panel: Recent Labs  Lab 07/14/20 1919 07/16/20 0614 07/17/20 0446  NA 137 132* 135  K 4.3 3.8 4.3  CL 103 99 102  CO2 23 26 26   GLUCOSE 148* 122* 124*  BUN 26* 23 25*  CREATININE 0.71 0.63 0.63  CALCIUM 8.8* 7.9* 8.2*   GFR Estimated Creatinine Clearance: 37.6 mL/min (by C-G formula based on SCr of 0.63 mg/dL). Liver Function Tests: Recent Labs  Lab 07/14/20 1919  AST 33  ALT 23  ALKPHOS 64  BILITOT 1.1  PROT 6.7  ALBUMIN 4.1   No results for input(s): LIPASE, AMYLASE in the last 168 hours. No results for input(s): AMMONIA in the last 168 hours. Coagulation profile Recent Labs  Lab 07/14/20 1919  INR 1.1    CBC: Recent Labs  Lab 07/14/20 1919 07/16/20 0614 07/17/20 0446  WBC 12.7* 10.3 11.7*  NEUTROABS 7.9*  --   --   HGB 9.2* 7.7* 6.4*  HCT 27.0* 22.4* 18.9*  MCV 98.2 98.2 99.5  PLT 371 293 291   Cardiac Enzymes: No results for input(s): CKTOTAL, CKMB, CKMBINDEX, TROPONINI in the last 168 hours. BNP (last 3 results) No results for input(s): PROBNP in the last 8760 hours. CBG: No results for input(s): GLUCAP in the last 168 hours. D-Dimer: No results for input(s): DDIMER in the last 72 hours. Hgb A1c: No results  for input(s): HGBA1C in the last 72 hours. Lipid Profile: No results for input(s): CHOL, HDL, LDLCALC, TRIG, CHOLHDL, LDLDIRECT in the last 72 hours. Thyroid function studies: No results for  input(s): TSH, T4TOTAL, T3FREE, THYROIDAB in the last 72 hours.  Invalid input(s): FREET3 Anemia work up: No results for input(s): VITAMINB12, FOLATE, FERRITIN, TIBC, IRON, RETICCTPCT in the last 72 hours. Sepsis Labs: Recent Labs  Lab 07/14/20 1919 07/14/20 2221 07/16/20 0614 07/17/20 0446  PROCALCITON  --  <0.10  --   --   WBC 12.7*  --  10.3 11.7*    Microbiology Recent Results (from the past 240 hour(s))  Resp Panel by RT-PCR (Flu A&B, Covid) Nasopharyngeal Swab     Status: None   Collection Time: 07/14/20  7:53 PM   Specimen: Nasopharyngeal Swab; Nasopharyngeal(NP) swabs in vial transport medium  Result Value Ref Range Status   SARS Coronavirus 2 by RT PCR NEGATIVE NEGATIVE Final    Comment: (NOTE) SARS-CoV-2 target nucleic acids are NOT DETECTED.  The SARS-CoV-2 RNA is generally detectable in upper respiratory specimens during the acute phase of infection. The lowest concentration of SARS-CoV-2 viral copies this assay can detect is 138 copies/mL. A negative result does not preclude SARS-Cov-2 infection and should not be used as the sole basis for treatment or other patient management decisions. A negative result may occur with  improper specimen collection/handling, submission of specimen other than nasopharyngeal swab, presence of viral mutation(s) within the areas targeted by this assay, and inadequate number of viral copies(<138 copies/mL). A negative result must be combined with clinical observations, patient history, and epidemiological information. The expected result is Negative.  Fact Sheet for Patients:  EntrepreneurPulse.com.au  Fact Sheet for Healthcare Providers:  IncredibleEmployment.be  This test is no t yet approved or  cleared by the [place] FDA and  has been authorized for detection and/or diagnosis of SARS-CoV-2 by FDA under an Emergency Use Authorization (EUA). This EUA will remain  in effect (meaning this test can be used) for the duration of the COVID-19 declaration under Section 564(b)(1) of the Act, 21 U.S.C.section 360bbb-3(b)(1), unless the authorization is terminated  or revoked sooner.       Influenza A by PCR NEGATIVE NEGATIVE Final   Influenza B by PCR NEGATIVE NEGATIVE Final    Comment: (NOTE) The Xpert Xpress SARS-CoV-2/FLU/RSV plus assay is intended as an aid in the diagnosis of influenza from Nasopharyngeal swab specimens and should not be used as a sole basis for treatment. Nasal washings and aspirates are unacceptable for Xpert Xpress SARS-CoV-2/FLU/RSV testing.  Fact Sheet for Patients: EntrepreneurPulse.com.au  Fact Sheet for Healthcare Providers: IncredibleEmployment.be  This test is not yet approved or cleared by the [place] FDA and has been authorized for detection and/or diagnosis of SARS-CoV-2 by FDA under an Emergency Use Authorization (EUA). This EUA will remain in effect (meaning this test can be used) for the duration of the COVID-19 declaration under Section 564(b)(1) of the Act, 21 U.S.C. section 360bbb-3(b)(1), unless the authorization is terminated or revoked.  Performed at [facility], [address] [place], [place] [address]     Procedures and diagnostic studies:  DG HIP OPERATIVE UNILAT W OR W/O PELVIS RIGHT  Result Date: 07/15/2020 CLINICAL DATA:  Right hip long nail. EXAM: OPERATIVE RIGHT HIP (WITH PELVIS IF PERFORMED) TECHNIQUE: Fluoroscopic spot image(s) were submitted for interpretation post-operatively. COMPARISON:  Preoperative radiograph yesterday. FINDINGS: Eight fluoroscopic spot views of the right hip obtained in the operating room. Intramedullary nail with trans trochanteric and  distal locking screw fixation of intertrochanteric femur fracture. Improved fracture alignment. Fluoroscopy time 1 minutes 27 seconds. IMPRESSION: Procedural fluoroscopy during right proximal femur fracture fixation. Electronically  Signed   By: Keith Rake M.D.   On: 07/15/2020 15:17   DG FEMUR PORT, MIN 2 VIEWS RIGHT  Result Date: 07/15/2020 CLINICAL DATA:  Postop. EXAM: RIGHT FEMUR PORTABLE 2 VIEW COMPARISON:  Preoperative hip radiograph yesterday. FINDINGS: Intramedullary nail with distal locking and trans trochanteric screw fixation of intertrochanteric right femur fracture. Improved fracture alignment from preoperative exam. No new fracture. Recent postsurgical change includes air and edema in the soft tissues with lateral skin staples. IMPRESSION: ORIF of intertrochanteric right femur fracture. No immediate postoperative complication. Electronically Signed   By: Keith Rake M.D.   On: 07/15/2020 15:59               LOS: 3 days   Jessica Checketts  [facility]   Pager on [email or web address]. If 7PM-7AM, please contact night-coverage at [email or web address]     07/17/2020, 11:02 AM

## 2020-07-17 NOTE — Progress Notes (Signed)
PT Cancellation Note  Patient Details Name: Melissa [name] MRN: [id number] DOB: 05-20-1928   Cancelled Treatment:    Reason Eval/Treat Not Completed: Medical issues which prohibited therapy   HgB 6.4 this am.  Will hold per protocol and continue as appropriate.   Chesley Noon 07/17/2020, 8:19 AM

## 2020-07-17 NOTE — NC FL2 (Signed)
[place] LEVEL OF CARE SCREENING TOOL     IDENTIFICATION  Patient Name: Melissa [name] Birthdate: [birth year] Sex: female Admission Date (Current Location): 07/14/2020  [place] and [place] Number:  Engineering geologist and Address:  [facility], [address], [place], [place] [address]      Provider Number: [id number]  Attending Physician Name and Address:  Jennye Boroughs, MD  Relative Name and Phone Number:  Jenny Reichmann SPouse [phone]    Current Level of Care: Hospital Recommended Level of Care: [place] Prior Approval Number:    Date Approved/Denied:   PASRR Number: [id number]  Discharge Plan: SNF    Current Diagnoses: Patient Active Problem List   Diagnosis Date Noted  . Closed intertrochanteric fracture of hip, right, initial encounter ([place]) 07/14/2020  . Hardening of the aorta (main artery of the heart) ([place]) 07/14/2020  . Accidental fall 07/14/2020  . Compression fx, lumbar spine ([place]) 04/11/2019  . Intractable low back pain 04/09/2019  . Compression fracture of lumbosacral vertebra, nontraumatic, initial encounter with retropulsion 04/09/2019  . Sacral insufficiency fracture 04/09/2019  . AI (aortic incompetence) 12/30/2017  . OP (osteoporosis) 12/30/2017  . S/P shoulder replacement 11/26/2013  . ANEMIA, IRON DEFICIENCY 04/05/2009  . HEMORRHOIDS, INTERNAL 04/05/2009  . GERD 04/05/2009  . CONSTIPATION 04/05/2009  . ARTHRITIS 04/05/2009    Orientation RESPIRATION BLADDER Height & Weight     Self,Time,Situation,Place  Normal Continent Weight: 49.9 kg Height:  5\' 4"  (162.6 cm)  BEHAVIORAL SYMPTOMS/MOOD NEUROLOGICAL BOWEL NUTRITION STATUS      Continent Diet (regular, thin liquid)  AMBULATORY STATUS COMMUNICATION OF NEEDS Skin   Extensive Assist Verbally Normal,Surgical wounds                       Personal Care Assistance Level of Assistance  Bathing,Dressing Bathing Assistance: Limited  assistance   Dressing Assistance: Limited assistance     Functional Limitations Info             SPECIAL CARE FACTORS FREQUENCY  PT (By licensed PT)     PT Frequency: 5 times per week              Contractures Contractures Info: Not present    Additional Factors Info  Allergies,Code Status Code Status Info: DNR Allergies Info: sulfa antibiotics           Current Medications (07/17/2020):  This is the current hospital active medication list Current Facility-Administered Medications  Medication Dose Route Frequency Provider Last Rate Last Admin  . acetaminophen (TYLENOL) tablet 325-650 mg  325-650 mg Oral Q6H PRN Thornton Park, MD   650 mg at 07/16/20 1704  . alum & mag hydroxide-simeth (MAALOX/MYLANTA) 200-200-20 MG/5ML suspension 30 mL  30 mL Oral Q4H PRN Thornton Park, MD      . bisacodyl (DULCOLAX) EC tablet 5 mg  5 mg Oral Daily PRN Thornton Park, MD      . bisacodyl (DULCOLAX) suppository 10 mg  10 mg Rectal Daily PRN Thornton Park, MD      . cholecalciferol (VITAMIN D3) tablet 1,000 Units  1,000 Units Oral Daily Thornton Park, MD   1,000 Units at 07/17/20 1007  . docusate sodium (COLACE) capsule 100 mg  100 mg Oral BID Thornton Park, MD   100 mg at 07/17/20 1007  . enoxaparin (LOVENOX) injection 40 mg  40 mg Subcutaneous Q24H Thornton Park, MD   40 mg at 07/17/20 1008  . fentaNYL (SUBLIMAZE) injection 50  mcg  50 mcg Intravenous Q1H PRN Thornton Park, MD   50 mcg at 07/14/20 2140  . ferrous sulfate tablet 325 mg  325 mg Oral TID PC Thornton Park, MD   325 mg at 07/17/20 1007  . HYDROcodone-acetaminophen (NORCO) 7.5-325 MG per tablet 1-2 tablet  1-2 tablet Oral Q4H PRN Thornton Park, MD   1 tablet at 07/17/20 0403  . HYDROcodone-acetaminophen (NORCO/VICODIN) 5-325 MG per tablet 1-2 tablet  1-2 tablet Oral Q4H PRN Thornton Park, MD   1 tablet at 07/16/20 1431  . lactated ringers infusion   Intravenous Continuous Thornton Park, MD    Stopped at 07/15/20 2253  . magnesium citrate solution 1 Bottle  1 Bottle Oral Once PRN Thornton Park, MD      . menthol-cetylpyridinium (CEPACOL) lozenge 3 mg  1 lozenge Oral PRN Thornton Park, MD       Or  . phenol (CHLORASEPTIC) mouth spray 1 spray  1 spray Mouth/Throat PRN Thornton Park, MD      . methocarbamol (ROBAXIN) tablet 500 mg  500 mg Oral Q6H PRN Thornton Park, MD   500 mg at 07/17/20 1007   Or  . methocarbamol (ROBAXIN) 500 mg in dextrose 5 % 50 mL IVPB  500 mg Intravenous Q6H PRN Thornton Park, MD      . morphine 2 MG/ML injection 0.5-1 mg  0.5-1 mg Intravenous Q2H PRN Thornton Park, MD   1 mg at 07/16/20 0401  . multivitamin with minerals tablet 1 tablet  1 tablet Oral Daily Thornton Park, MD   1 tablet at 07/17/20 1007  . ondansetron (ZOFRAN) tablet 4 mg  4 mg Oral Q6H PRN Thornton Park, MD       Or  . ondansetron [facility]) injection 4 mg  4 mg Intravenous Q6H PRN Thornton Park, MD      . polyethylene glycol (MIRALAX / GLYCOLAX) packet 17 g  17 g Oral Daily PRN Thornton Park, MD      . promethazine (PHENERGAN) 6.25 mg in sodium chloride 0.9 % 50 mL IVPB  6.25 mg Intravenous Once PRN Thornton Park, MD      . saccharomyces boulardii (FLORASTOR) capsule 250 mg  250 mg Oral Daily Jennye Boroughs, MD   250 mg at 07/17/20 1006  . senna (SENOKOT) tablet 8.6 mg  1 tablet Oral BID Thornton Park, MD   8.6 mg at 07/17/20 1007  . senna-docusate (Senokot-S) tablet 1 tablet  1 tablet Oral QHS PRN Thornton Park, MD      . traMADol Veatrice Bourbon) tablet 50 mg  50 mg Oral Q6H Thornton Park, MD   50 mg at 07/17/20 1007  . trolamine salicylate (ASPERCREME) 10 % cream 1 application  1 application Topical PRN Thornton Park, MD      . zolpidem (AMBIEN) tablet 5 mg  5 mg Oral QHS PRN Thornton Park, MD         Discharge Medications: Please see discharge summary for a list of discharge medications.  Relevant Imaging Results:  Relevant Lab  Results:   Additional Information [place], RN

## 2020-07-18 DIAGNOSIS — S72141A Displaced intertrochanteric fracture of right femur, initial encounter for closed fracture: Secondary | ICD-10-CM | POA: Diagnosis not present

## 2020-07-18 LAB — CBC
HCT: 19.7 % — ABNORMAL LOW (ref 36.0–46.0)
Hemoglobin: 6.7 g/dL — ABNORMAL LOW (ref 12.0–15.0)
MCH: 32.4 pg (ref 26.0–34.0)
MCHC: 34 g/dL (ref 30.0–36.0)
MCV: 95.2 fL (ref 80.0–100.0)
Platelets: 289 10*3/uL (ref 150–400)
RBC: 2.07 MIL/uL — ABNORMAL LOW (ref 3.87–5.11)
RDW: 20.5 % — ABNORMAL HIGH (ref 11.5–15.5)
WBC: 11.8 10*3/uL — ABNORMAL HIGH (ref 4.0–10.5)
nRBC: 1.1 % — ABNORMAL HIGH (ref 0.0–0.2)

## 2020-07-18 LAB — BPAM RBC
Blood Product Expiration Date: 202206152359
ISSUE DATE / TIME: 202205291014
Unit Type and Rh: 5100

## 2020-07-18 LAB — TYPE AND SCREEN
ABO/RH(D): O POS
Antibody Screen: NEGATIVE
Unit division: 0

## 2020-07-18 LAB — PREPARE RBC (CROSSMATCH)

## 2020-07-18 MED ORDER — SODIUM CHLORIDE 0.9% IV SOLUTION: Freq: Once | INTRAVENOUS | Status: AC

## 2020-07-18 NOTE — Progress Notes (Signed)
Physical Therapy Treatment Patient Details Name: Melissa [name] MRN: [id number] DOB: Oct 07, 1928 Today's Date: 07/18/2020    History of Present Illness Pt is an 87 y/o female who presents to [facility] following a fall at home on 07/14/20 resuliting in a R hip fracture. Pt is s/p intramedullary fixation of R intertrochanteric hip fracure. PMH: arthritis, chest pain, GERD, heart murmur, HA migraine, iron deficiecy and PONV.    PT Comments    Patient with significant fatigue/lethargy this date; difficulty maintaining eyes open, adequate alertness throughout session (despite constant cuing/stimulation).  Tolerated R LE supine therex without significant pain indicators; occasional grimacing with hip flexion Additional mobility/OOB efforts deferred secondary to lethargy and persistently low HgB (6.7 this date); will continue to follow and progress as medically appropriate.    Follow Up Recommendations  SNF     Equipment Recommendations  None recommended by PT    Recommendations for Other Services       Precautions / Restrictions Precautions Precautions: Fall Restrictions Weight Bearing Restrictions: Yes RLE Weight Bearing: Weight bearing as tolerated    Mobility  Bed Mobility Overal bed mobility: Needs Assistance             General bed mobility comments: max/dep assist for repositioning in bed, tends to list R with fatigue    Transfers                 General transfer comment: deferred secondary to lethargy  Ambulation/Gait             General Gait Details: deferred secondary to lethargy and low HgB   Stairs             Wheelchair Mobility    Modified Rankin (Stroke Patients Only)       Balance                                            Cognition Arousal/Alertness: Lethargic Behavior During Therapy: WFL for tasks assessed/performed                                   General Comments: frequent cuing/stimulation  for level of alertness and active participation with session      Exercises Other Exercises Other Exercises: Supine R LE therex, 2x10, act assist/passive ROM: ankle pumps, SAQs, heel slides, hip abduct/adduct.  Fair/good tolerance for ROM this date; difficulty maintaining alertness for full, active participation with session. Other Exercises: Assisted with gown change (breakfast spilled on self), max/dep assist to complete functional task    General Comments        Pertinent Vitals/Pain Pain Assessment: Faces Faces Pain Scale: Hurts even more Pain Location: R hip with mobilization, otherwise pt resting comfortably. Pain Descriptors / Indicators: Aching;Grimacing Pain Intervention(s): Limited activity within patient's tolerance;Monitored during session;Repositioned    Home Living                      Prior Function            PT Goals (current goals can now be found in the care plan section) Acute Rehab PT Goals Patient Stated Goal: Decrease hip pain PT Goal Formulation: With patient Time For Goal Achievement: 07/29/20 Potential to Achieve Goals: Fair Progress towards PT goals: Progressing toward goals    Frequency  BID      PT Plan Current plan remains appropriate    Co-evaluation              AM-PAC PT "6 Clicks" Mobility   Outcome Measure  Help needed turning from your back to your side while in a flat bed without using bedrails?: A Lot Help needed moving from lying on your back to sitting on the side of a flat bed without using bedrails?: A Lot Help needed moving to and from a bed to a chair (including a wheelchair)?: A Lot Help needed standing up from a chair using your arms (e.g., wheelchair or bedside chair)?: A Lot Help needed to walk in hospital room?: Total Help needed climbing 3-5 steps with a railing? : Total 6 Click Score: 10    End of Session   Activity Tolerance: Patient limited by fatigue;Patient limited by lethargy Patient  left: in bed;with call bell/phone within reach;with bed alarm set;with family/visitor present Nurse Communication: Mobility status PT Visit Diagnosis: Unsteadiness on feet (R26.81);Muscle weakness (generalized) (M62.81);Repeated falls (R29.6)     Time: 0940-1003 PT Time Calculation (min) (ACUTE ONLY): 23 min  Charges:  $Therapeutic Exercise: 8-22 mins $Therapeutic Activity: 8-22 mins                    Yarethzy Croak H. Owens Shark, PT, DPT, NCS 07/18/20, 10:27 AM [phone]

## 2020-07-18 NOTE — TOC Progression Note (Signed)
Transition of Care [facility]) - Progression Note    Patient Details  Name: Melissa [name] MRN: [id number] Date of Birth: 02-03-1929  Transition of Care [facility]) CM/SW [place], RN Phone Number: 07/18/2020, 1:33 PM  Clinical Narrative:  TOC in to see patient at bedside.  Patient getting blood transfusion and presented lethargic.  Stated she just wanted to rest.  Will monitor for discharge date.  TOC contact information given.     Expected Discharge Plan:  ([place]) Barriers to Discharge: Continued Medical Work up  Expected Discharge Plan and Services Expected Discharge Plan:  ([place])     Post Acute Care Choice:  ([place])                                         Social Determinants of Health (SDOH) Interventions    Readmission Risk Interventions No flowsheet data found.

## 2020-07-18 NOTE — Progress Notes (Signed)
Occupational Therapy Treatment Patient Details Name: Melissa [name] MRN: [id number] DOB: [birth year] Today's Date: 07/18/2020    History of present illness Pt is an 87 y/o female who presents to [facility] following a fall at home on 07/14/20 resuliting in a R hip fracture. Pt is s/p intramedullary fixation of R intertrochanteric hip fracure. PMH: arthritis, chest pain, GERD, heart murmur, HA migraine, iron deficiecy and PONV.   OT comments  Pt seen for OT tx this date to f/u re: safety with ADLs/ADL mobility. Light OT session given pt with blood running to replenish Hgb (past 15 minute mark). Pt requires MAX A to come to sitting and demos improved static sitting balance (Fair balance with alternating UE support) and MIN A for oral care task. Extended time required to complete seated g/h tasks. Pt tolerates sitting ~11 mins. Increased time spent and TOTAL A to help pt back to bed and reposition for comfort including MAX A to roll to her non-op L side and place pillow under her R hip to weight shift off post-op area. Pt appears to tolerate this position better, but still c/o pain. RN notified. Will continue to follow. Continue to anticipate pt will require STR f/u.   Follow Up Recommendations  SNF    Equipment Recommendations  Other (comment) (defer)    Recommendations for Other Services      Precautions / Restrictions Precautions Precautions: Fall Restrictions Weight Bearing Restrictions: Yes RLE Weight Bearing: Weight bearing as tolerated       Mobility Bed Mobility Overal bed mobility: Needs Assistance Bed Mobility: Supine to Sit;Sit to Supine     Supine to sit: Mod assist;Max assist;HOB elevated Sit to supine: Max assist   General bed mobility comments: MAX A to assist back to supine    Transfers                 General transfer comment: deferred as pt with blood running at this time and c/o significant pain with transiton to sitting    Balance Overall balance  assessment: Needs assistance Sitting-balance support: Bilateral upper extremity supported Sitting balance-Leahy Scale: Fair Sitting balance - Comments: improved static sitting balance this date, using UEs for support, decreased need for external support to sustain static sitting while performing ADL tasks                                   ADL either performed or assessed with clinical judgement   ADL Overall ADL's : Needs assistance/impaired     Grooming: Wash/dry face;Oral care;Set up;Minimal assistance;Sitting Grooming Details (indicate cue type and reason): EOB sitting, demos improved static sitting balance, still requiring MIN A for actual ADL task as pt requires tactile assit to position basin in correct spot to spit after brushing teeth.             Lower Body Dressing: Total assistance;Sitting/lateral leans Lower Body Dressing Details (indicate cue type and reason): to don socks. Pt makes attempt to contribute to donning her L (non-op LE) sock, but to no avail as this too causes pain in R hip.                     Vision Baseline Vision/History: Wears glasses Wears Glasses: At all times Patient Visual Report: No change from baseline     Perception     Praxis      Cognition Arousal/Alertness: Awake/alert Behavior During Therapy:  WFL for tasks assessed/performed Overall Cognitive Status: Impaired/Different from baseline Area of Impairment: Safety/judgement;Orientation;Following commands;Memory;Problem solving                 Orientation Level: Disoriented to;Situation;Place;Time   Memory: Decreased short-term memory Following Commands: Follows one step commands with increased time Safety/Judgement: Decreased awareness of safety;Decreased awareness of deficits   Problem Solving: Requires verbal cues;Requires tactile cues;Difficulty sequencing General Comments: Pt's spouse endorses declining cognitive status at baseline, but notes that pt  more confused during hospital stay. However, pt more awake and communicative this session than on OT evaluation 2 days prior.        Exercises Other Exercises Other Exercises: OT enages pt in sitting g/h tasks.   Shoulder Instructions       General Comments      Pertinent Vitals/ Pain       Pain Assessment: Faces Faces Pain Scale: Hurts even more Pain Location: R hip with sup<>sit Pain Descriptors / Indicators: Aching;Grimacing Pain Intervention(s): Limited activity within patient's tolerance;Monitored during session;Patient requesting pain meds-RN notified  Home Living                                          Prior Functioning/Environment              Frequency  Min 2X/week        Progress Toward Goals  OT Goals(current goals can now be found in the care plan section)  Progress towards OT goals: Progressing toward goals  Acute Rehab OT Goals Patient Stated Goal: Decrease hip pain OT Goal Formulation: With patient/family Time For Goal Achievement: 07/30/20 Potential to Achieve Goals: [place] Discharge plan remains appropriate    Co-evaluation                 AM-PAC OT "6 Clicks" Daily Activity     Outcome Measure   Help from another person eating meals?: A Little Help from another person taking care of personal grooming?: A Little Help from another person toileting, which includes using toliet, bedpan, or urinal?: A Lot Help from another person bathing (including washing, rinsing, drying)?: A Lot Help from another person to put on and taking off regular upper body clothing?: A Lot Help from another person to put on and taking off regular lower body clothing?: A Lot 6 Click Score: 14    End of Session    OT Visit Diagnosis: Unsteadiness on feet (R26.81);Muscle weakness (generalized) (M62.81);History of falling (Z91.81);Pain Pain - Right/Left: Right Pain - part of body: Hip   Activity Tolerance Patient limited by  fatigue;Patient limited by pain   Patient Left in bed;with call bell/phone within reach;with bed alarm set;with family/visitor present   Nurse Communication Other (comment) (notified of pain)        Time: 8563-1497 OT Time Calculation (min): 38 min  Charges: OT General Charges $OT Visit: 1 Visit OT Treatments $Self Care/Home Management : 23-37 mins $Therapeutic Activity: 8-22 mins  Gerrianne Scale, MS, OTR/L ascom [phone] 07/18/20, 3:13 PM

## 2020-07-18 NOTE — Progress Notes (Signed)
Progress Note    Melissa [name]  RCB:[id number] DOB: 1928-07-25  DOA: 07/14/2020 PCP: [name], MD      Brief Narrative:    Medical records reviewed and are as summarized below:  Melissa [name] is a 87 y.o. female with medical history significant for iron deficiency anemia who presented to the hospital after an accidental fall.  She was found to have right hip fracture.  She was treated with analgesics.      Assessment/Plan:   Principal Problem:   Closed intertrochanteric fracture of hip, right, initial encounter ([place]) Active Problems:   ANEMIA, IRON DEFICIENCY   Accidental fall   Nutrition Problem: Increased nutrient needs Etiology: hip fracture  Signs/Symptoms: estimated needs   Body mass index is 18.88 kg/m.   Closed right intertrochanteric hip fracture, s/p fall: S/p intramedullary fixation of right intertrochanteric hip fracture on 07/15/2020.  Analgesics as needed for pain.  Continue PT and OT.  Follow-up with orthopedic surgeon.  Acute hypoxia: Resolved.  She is tolerating room air.  Acute blood loss anemia, chronic iron deficiency anemia: Transfuse additional unit of packed red blood cells because of severe anemia.  Hemoglobin only went from 6.4-6.7 after 1 unit of blood.  S/p transfusion with 1 unit of PRBCs on 07/17/2020.  Monitor H&H and watch for transfusion reaction.  Heart murmur and cardiomegaly on chest x-ray.  Patient refused to have 2D echo.  Her husband said murmur has been present for a very long time and they recently had their annual physical exam with PCP who gave them a clean bill of health.  Diet Order            Diet regular Room service appropriate? Yes; Fluid consistency: Thin  Diet effective now                    Consultants:  Orthopedic surgeon  Procedures:  Right hip surgery    Medications:   . cholecalciferol  1,000 Units Oral Daily  . docusate sodium  100 mg Oral BID  . enoxaparin (LOVENOX) injection  40  mg Subcutaneous Q24H  . ferrous sulfate  325 mg Oral TID PC  . multivitamin with minerals  1 tablet Oral Daily  . saccharomyces boulardii  250 mg Oral Daily  . senna  1 tablet Oral BID  . traMADol  50 mg Oral Q6H   Continuous Infusions: . lactated ringers Stopped (07/15/20 2253)  . methocarbamol (ROBAXIN) IV    . promethazine (PHENERGAN) injection (IM or IVPB)       Anti-infectives (From admission, onward)   Start     Dose/Rate Route Frequency Ordered Stop   07/15/20 1700  ceFAZolin (ANCEF) IVPB 2g/100 mL premix        2 g 200 mL/hr over 30 Minutes Intravenous Every 6 hours 07/15/20 1611 07/15/20 2315   07/15/20 1431  gentamicin 80 mg in 0.9% sodium chloride 250 mL irrigation  Status:  Discontinued          As needed 07/15/20 1432 07/15/20 1453   07/15/20 1000  ceFAZolin (ANCEF) IVPB 2g/100 mL premix        2 g 200 mL/hr over 30 Minutes Intravenous 30 min pre-op 07/14/20 2042 07/15/20 1338             Family Communication/Anticipated D/C date and plan/Code Status   DVT prophylaxis: enoxaparin (LOVENOX) injection 40 mg Start: 07/16/20 0800 SCDs Start: 07/15/20 1612 Place TED hose Start: 07/15/20 1612 Place and  maintain sequential compression device Start: 07/14/20 2224 SCDs Start: 07/14/20 2050     Code Status: DNR  Family Communication: None Disposition Plan:    Status is: Inpatient  Remains inpatient appropriate because:Inpatient level of care appropriate due to severity of illness   Dispo: The patient is from: Home              Anticipated d/c is to: SNF              Patient currently is not medically stable to d/c.   Difficult to place patient No           Subjective:   Interval events noted.  She still has pain in the right hip.  No active bleeding from right hip wound.  Objective:    Vitals:   07/18/20 0750 07/18/20 1143 07/18/20 1216 07/18/20 1232  BP: (!) 142/63 114/75 (!) 122/52 (!) 115/54  Pulse: 84 79 75 79  Resp: 17 16 16 16    Temp: (!) 97.3 F (36.3 C) 97.7 F (36.5 C) 98.4 F (36.9 C) 97.9 F (36.6 C)  TempSrc: Oral Oral    SpO2: 92% 96% 93% 95%  Weight:      Height:       No data found.   Intake/Output Summary (Last 24 hours) at 07/18/2020 1315 Last data filed at 07/18/2020 0440 Gross per 24 hour  Intake 0 ml  Output 50 ml  Net -50 ml   Filed Weights   07/14/20 1856 07/15/20 1208  Weight: 49.9 kg 49.9 kg    Exam:  GEN: NAD SKIN: Warm and dry EYES: Pale but anicteric ENT: MMM CV: RRR PULM: CTA B ABD: soft, ND, NT, +BS CNS: AAO x person and place, non focal EXT: Mild right hip tenderness.  Dressing on right hip is dry and intact.     Data Reviewed:   I have personally reviewed following labs and imaging studies:  Labs: Labs show the following:   Basic Metabolic Panel: Recent Labs  Lab 07/14/20 1919 07/16/20 0614 07/17/20 0446  NA 137 132* 135  K 4.3 3.8 4.3  CL 103 99 102  CO2 23 26 26   GLUCOSE 148* 122* 124*  BUN 26* 23 25*  CREATININE 0.71 0.63 0.63  CALCIUM 8.8* 7.9* 8.2*   GFR Estimated Creatinine Clearance: 37.6 mL/min (by C-G formula based on SCr of 0.63 mg/dL). Liver Function Tests: Recent Labs  Lab 07/14/20 1919  AST 33  ALT 23  ALKPHOS 64  BILITOT 1.1  PROT 6.7  ALBUMIN 4.1   No results for input(s): LIPASE, AMYLASE in the last 168 hours. No results for input(s): AMMONIA in the last 168 hours. Coagulation profile Recent Labs  Lab 07/14/20 1919  INR 1.1    CBC: Recent Labs  Lab 07/14/20 1919 07/16/20 0614 07/17/20 0446 07/18/20 0512  WBC 12.7* 10.3 11.7* 11.8*  NEUTROABS 7.9*  --   --   --   HGB 9.2* 7.7* 6.4* 6.7*  HCT 27.0* 22.4* 18.9* 19.7*  MCV 98.2 98.2 99.5 95.2  PLT 371 293 291 289   Cardiac Enzymes: No results for input(s): CKTOTAL, CKMB, CKMBINDEX, TROPONINI in the last 168 hours. BNP (last 3 results) No results for input(s): PROBNP in the last 8760 hours. CBG: No results for input(s): GLUCAP in the last 168  hours. D-Dimer: No results for input(s): DDIMER in the last 72 hours. Hgb A1c: No results for input(s): HGBA1C in the last 72 hours. Lipid Profile: No results for input(s):  CHOL, HDL, LDLCALC, TRIG, CHOLHDL, LDLDIRECT in the last 72 hours. Thyroid function studies: No results for input(s): TSH, T4TOTAL, T3FREE, THYROIDAB in the last 72 hours.  Invalid input(s): FREET3 Anemia work up: No results for input(s): VITAMINB12, FOLATE, FERRITIN, TIBC, IRON, RETICCTPCT in the last 72 hours. Sepsis Labs: Recent Labs  Lab 07/14/20 1919 07/14/20 2221 07/16/20 0614 07/17/20 0446 07/18/20 0512  PROCALCITON  --  <0.10  --   --   --   WBC 12.7*  --  10.3 11.7* 11.8*    Microbiology Recent Results (from the past 240 hour(s))  Resp Panel by RT-PCR (Flu A&B, Covid) Nasopharyngeal Swab     Status: None   Collection Time: 07/14/20  7:53 PM   Specimen: Nasopharyngeal Swab; Nasopharyngeal(NP) swabs in vial transport medium  Result Value Ref Range Status   SARS Coronavirus 2 by RT PCR NEGATIVE NEGATIVE Final    Comment: (NOTE) SARS-CoV-2 target nucleic acids are NOT DETECTED.  The SARS-CoV-2 RNA is generally detectable in upper respiratory specimens during the acute phase of infection. The lowest concentration of SARS-CoV-2 viral copies this assay can detect is 138 copies/mL. A negative result does not preclude SARS-Cov-2 infection and should not be used as the sole basis for treatment or other patient management decisions. A negative result may occur with  improper specimen collection/handling, submission of specimen other than nasopharyngeal swab, presence of viral mutation(s) within the areas targeted by this assay, and inadequate number of viral copies(<138 copies/mL). A negative result must be combined with clinical observations, patient history, and epidemiological information. The expected result is Negative.  Fact Sheet for Patients:  EntrepreneurPulse.com.au  Fact  Sheet for Healthcare Providers:  IncredibleEmployment.be  This test is no t yet approved or cleared by the [place] FDA and  has been authorized for detection and/or diagnosis of SARS-CoV-2 by FDA under an Emergency Use Authorization (EUA). This EUA will remain  in effect (meaning this test can be used) for the duration of the COVID-19 declaration under Section 564(b)(1) of the Act, 21 U.S.C.section 360bbb-3(b)(1), unless the authorization is terminated  or revoked sooner.       Influenza A by PCR NEGATIVE NEGATIVE Final   Influenza B by PCR NEGATIVE NEGATIVE Final    Comment: (NOTE) The Xpert Xpress SARS-CoV-2/FLU/RSV plus assay is intended as an aid in the diagnosis of influenza from Nasopharyngeal swab specimens and should not be used as a sole basis for treatment. Nasal washings and aspirates are unacceptable for Xpert Xpress SARS-CoV-2/FLU/RSV testing.  Fact Sheet for Patients: EntrepreneurPulse.com.au  Fact Sheet for Healthcare Providers: IncredibleEmployment.be  This test is not yet approved or cleared by the [place] FDA and has been authorized for detection and/or diagnosis of SARS-CoV-2 by FDA under an Emergency Use Authorization (EUA). This EUA will remain in effect (meaning this test can be used) for the duration of the COVID-19 declaration under Section 564(b)(1) of the Act, 21 U.S.C. section 360bbb-3(b)(1), unless the authorization is terminated or revoked.  Performed at [facility], [address] [place], [place] [address]     Procedures and diagnostic studies:  No results found.             LOS: 4 days   Phi Avans  [facility]   Pager on [email or web address]. If 7PM-7AM, please contact night-coverage at [email or web address]     07/18/2020, 1:15 PM

## 2020-07-18 NOTE — Plan of Care (Signed)

## 2020-07-19 ENCOUNTER — Inpatient Hospital Stay (HOSPITAL_COMMUNITY)
Admit: 2020-07-19 | Discharge: 2020-07-19 | Disposition: A | Discharge status: Home / Self Care | Payer: Medicare Other | Attending: Orthopedic Surgery | Admitting: Orthopedic Surgery

## 2020-07-19 DIAGNOSIS — S72141A Displaced intertrochanteric fracture of right femur, initial encounter for closed fracture: Secondary | ICD-10-CM | POA: Diagnosis not present

## 2020-07-19 DIAGNOSIS — I350 Nonrheumatic aortic (valve) stenosis: Secondary | ICD-10-CM | POA: Diagnosis not present

## 2020-07-19 DIAGNOSIS — D509 Iron deficiency anemia, unspecified: Secondary | ICD-10-CM | POA: Diagnosis not present

## 2020-07-19 DIAGNOSIS — R4189 Other symptoms and signs involving cognitive functions and awareness: Secondary | ICD-10-CM | POA: Diagnosis not present

## 2020-07-19 DIAGNOSIS — M9701XA Periprosthetic fracture around internal prosthetic right hip joint, initial encounter: Secondary | ICD-10-CM | POA: Diagnosis not present

## 2020-07-19 DIAGNOSIS — Z4789 Encounter for other orthopedic aftercare: Secondary | ICD-10-CM | POA: Diagnosis not present

## 2020-07-19 DIAGNOSIS — F0390 Unspecified dementia without behavioral disturbance: Secondary | ICD-10-CM | POA: Diagnosis not present

## 2020-07-19 DIAGNOSIS — I351 Nonrheumatic aortic (valve) insufficiency: Secondary | ICD-10-CM | POA: Diagnosis not present

## 2020-07-19 DIAGNOSIS — K219 Gastro-esophageal reflux disease without esophagitis: Secondary | ICD-10-CM | POA: Diagnosis not present

## 2020-07-19 DIAGNOSIS — S72001A Fracture of unspecified part of neck of right femur, initial encounter for closed fracture: Secondary | ICD-10-CM | POA: Diagnosis not present

## 2020-07-19 DIAGNOSIS — M6281 Muscle weakness (generalized): Secondary | ICD-10-CM | POA: Diagnosis not present

## 2020-07-19 DIAGNOSIS — D649 Anemia, unspecified: Secondary | ICD-10-CM | POA: Diagnosis not present

## 2020-07-19 DIAGNOSIS — S72141D Displaced intertrochanteric fracture of right femur, subsequent encounter for closed fracture with routine healing: Secondary | ICD-10-CM | POA: Diagnosis not present

## 2020-07-19 DIAGNOSIS — Z741 Need for assistance with personal care: Secondary | ICD-10-CM | POA: Diagnosis not present

## 2020-07-19 DIAGNOSIS — R011 Cardiac murmur, unspecified: Secondary | ICD-10-CM

## 2020-07-19 DIAGNOSIS — I361 Nonrheumatic tricuspid (valve) insufficiency: Secondary | ICD-10-CM | POA: Diagnosis not present

## 2020-07-19 DIAGNOSIS — F0150 Vascular dementia without behavioral disturbance: Secondary | ICD-10-CM | POA: Diagnosis not present

## 2020-07-19 DIAGNOSIS — R278 Other lack of coordination: Secondary | ICD-10-CM | POA: Diagnosis not present

## 2020-07-19 DIAGNOSIS — R262 Difficulty in walking, not elsewhere classified: Secondary | ICD-10-CM | POA: Diagnosis not present

## 2020-07-19 DIAGNOSIS — I340 Nonrheumatic mitral (valve) insufficiency: Secondary | ICD-10-CM

## 2020-07-19 DIAGNOSIS — M5117 Intervertebral disc disorders with radiculopathy, lumbosacral region: Secondary | ICD-10-CM | POA: Diagnosis not present

## 2020-07-19 LAB — CBC WITH DIFFERENTIAL/PLATELET
Abs Immature Granulocytes: 0.23 10*3/uL — ABNORMAL HIGH (ref 0.00–0.07)
Basophils Absolute: 0.1 10*3/uL (ref 0.0–0.1)
Basophils Relative: 1 %
Eosinophils Absolute: 0.1 10*3/uL (ref 0.0–0.5)
Eosinophils Relative: 0 %
HCT: 23.9 % — ABNORMAL LOW (ref 36.0–46.0)
Hemoglobin: 8.2 g/dL — ABNORMAL LOW (ref 12.0–15.0)
Immature Granulocytes: 2 %
Lymphocytes Relative: 10 %
Lymphs Abs: 1.1 10*3/uL (ref 0.7–4.0)
MCH: 31.4 pg (ref 26.0–34.0)
MCHC: 34.3 g/dL (ref 30.0–36.0)
MCV: 91.6 fL (ref 80.0–100.0)
Monocytes Absolute: 1.6 10*3/uL — ABNORMAL HIGH (ref 0.1–1.0)
Monocytes Relative: 14 %
Neutro Abs: 8 10*3/uL — ABNORMAL HIGH (ref 1.7–7.7)
Neutrophils Relative %: 73 %
Platelets: 308 10*3/uL (ref 150–400)
RBC: 2.61 MIL/uL — ABNORMAL LOW (ref 3.87–5.11)
RDW: 19.4 % — ABNORMAL HIGH (ref 11.5–15.5)
WBC: 11.1 10*3/uL — ABNORMAL HIGH (ref 4.0–10.5)
nRBC: 2.4 % — ABNORMAL HIGH (ref 0.0–0.2)

## 2020-07-19 LAB — ECHOCARDIOGRAM COMPLETE
AR max vel: 1.78 cm2
AV Area VTI: 1.8 cm2
AV Area mean vel: 1.8 cm2
AV Mean grad: 17 mmHg
AV Peak grad: 30.5 mmHg
Ao pk vel: 2.76 m/s
Area-P 1/2: 3.83 cm2
Height: 64 in
MV VTI: 3.48 cm2
P 1/2 time: 573 msec
S' Lateral: 2.4 cm
Weight: 1760.15 oz

## 2020-07-19 LAB — TYPE AND SCREEN
ABO/RH(D): O POS
Antibody Screen: NEGATIVE
Unit division: 0

## 2020-07-19 LAB — RESP PANEL BY RT-PCR (FLU A&B, COVID) ARPGX2
Influenza A by PCR: NEGATIVE
Influenza B by PCR: NEGATIVE
SARS Coronavirus 2 by RT PCR: NEGATIVE

## 2020-07-19 LAB — SARS CORONAVIRUS 2 (TAT 6-24 HRS): SARS Coronavirus 2: NEGATIVE

## 2020-07-19 LAB — BPAM RBC
Blood Product Expiration Date: 202207032359
ISSUE DATE / TIME: 202205301210
Unit Type and Rh: 5100

## 2020-07-19 MED ORDER — ASPIRIN EC 81 MG PO TBEC: 325.0000 mg | DELAYED_RELEASE_TABLET | Freq: Two times a day (BID) | ORAL | Status: DC

## 2020-07-19 MED ORDER — ENOXAPARIN SODIUM 40 MG/0.4ML IJ SOSY: 40.0000 mg | PREFILLED_SYRINGE | INTRAMUSCULAR | 0 refills | Status: DC

## 2020-07-19 MED ORDER — FERROUS SULFATE 325 (65 FE) MG PO TABS: 325.0000 mg | ORAL_TABLET | Freq: Three times a day (TID) | ORAL | Status: DC

## 2020-07-19 MED ORDER — FERROUS SULFATE 325 (65 FE) MG PO TABS: 325.0000 mg | ORAL_TABLET | ORAL | Status: DC

## 2020-07-19 MED ORDER — ASPIRIN EC 325 MG PO TBEC: 325.0000 mg | DELAYED_RELEASE_TABLET | Freq: Every day | ORAL | Status: DC

## 2020-07-19 MED ORDER — TRAMADOL HCL 50 MG PO TABS: 50.0000 mg | ORAL_TABLET | Freq: Four times a day (QID) | ORAL | 0 refills | Status: DC | PRN

## 2020-07-19 NOTE — Plan of Care (Signed)

## 2020-07-19 NOTE — Care Management Important Message (Signed)
Important Message  Patient Details  Name: Melissa [name] MRN: [id number] Date of Birth: 10-Apr-1928   Medicare Important Message Given:  Yes     Juliann Pulse A Charlotte Fidalgo 07/19/2020, 11:15 AM

## 2020-07-19 NOTE — Progress Notes (Signed)
[facility] picked up patient for transport at 1701

## 2020-07-19 NOTE — Discharge Summary (Addendum)
Physician Discharge Summary  Melissa [name] TDS:[id number] DOB: 09-Sep-1928 DOA: 07/14/2020  PCP: [name], MD  Admit date: 07/14/2020 Discharge date: 07/19/2020  Discharge disposition: Skilled nursing facility   Recommendations for Outpatient Follow-Up:   Follow-up with Dr. Mack Guise, orthopedic surgeon, in 1 week. Follow-up with physician at the nursing home within 3 days of discharge.   Discharge Diagnosis:   Principal Problem:   Closed intertrochanteric fracture of hip, right, initial encounter ([facility]) Active Problems:   ANEMIA, IRON DEFICIENCY   Accidental fall    Discharge Condition: Stable.  Diet recommendation:  Diet Order            Diet - low sodium heart healthy           Diet regular Room service appropriate? Yes; Fluid consistency: Thin  Diet effective now                   Code Status: DNR     Hospital Course:   Ms. Melissa [name] is a 87 y.o. female with medical history significant for iron deficiency anemia who presented to the hospital after an accidental fall.  She was found to have right hip fracture and acute hypoxia.  She was treated with analgesics.   She underwent intramedullary fixation of right intertrochanteric hip fracture on 07/15/2020.  She developed acute postoperative blood loss anemia.  She was transfused with 2 units of packed red blood cells.  Hemoglobin improved from 6.4-8.2.  She was evaluated by PT and OT who recommended further rehabilitation at the skilled nursing facility. Dr. Mack Guise, orthopedic surgeon, recommended 2 weeks of Lovenox 40 mg daily for DVT prophylaxis at discharge.      Medical Consultants:    Orthopedic surgeon   Discharge Exam:    Vitals:   07/18/20 2324 07/19/20 0438 07/19/20 0756 07/19/20 1158  BP: 134/71 131/65 (!) 132/57 114/60  Pulse: 91 86 82 86  Resp: 17 17 16 16   Temp: 98.2 F (36.8 C) 98.5 F (36.9 C) 98.2 F (36.8 C) 98.1 F (36.7 C)  TempSrc: Oral Oral Oral Oral  SpO2:  94% 94% 94% 96%  Weight:      Height:         GEN: NAD SKIN: Warm and dry EYES: No pallor or icterus ENT: MMM CV: RRR PULM: CTA B ABD: soft, ND, NT, +BS CNS: AAO x 3, non focal EXT: Mild right hip tenderness.  Dressing on right hip is dry and intact.   The results of significant diagnostics from this hospitalization (including imaging, microbiology, ancillary and laboratory) are listed below for reference.     Procedures and Diagnostic Studies:   ECHOCARDIOGRAM COMPLETE  Result Date: 07/19/2020    ECHOCARDIOGRAM REPORT   Patient Name:   Melissa [name] Date of Exam: 07/19/2020 Medical Rec #:  [id number]      Height:       64.0 in Accession #:    [id number]     Weight:       110.0 lb Date of Birth:  1928-10-20       BSA:          1.517 m Patient Age:    61 years       BP:           131/65 mmHg Patient Gender: F              HR:           79 bpm. Exam Location:  [facility] Procedure: 2D Echo, Color Doppler and Cardiac Doppler Indications:     R01.1 Murmur  History:         Patient has prior history of Echocardiogram examinations.                  Signs/Symptoms:Murmur.  Sonographer:     Charmayne Sheer RDCS (AE) Referring Phys:  [id number] [place] Diagnosing Phys: Nelva Bush MD IMPRESSIONS  1. Left ventricular ejection fraction, by estimation, is 60 to 65%. The left ventricle has normal function. The left ventricle has no regional wall motion abnormalities. Left ventricular diastolic parameters are consistent with Grade I diastolic dysfunction (impaired relaxation).  2. Right ventricular systolic function is normal. The right ventricular size is normal. There is mildly elevated pulmonary artery systolic pressure.  3. Left atrial size was mildly dilated.  4. The mitral valve is myxomatous. Mild mitral valve regurgitation. No evidence of mitral stenosis. There is mild late systolic prolapse of both leaflets of the mitral valve.  5. The aortic valve is tricuspid. There is moderate calcification of the  aortic valve. There is moderate thickening of the aortic valve. Aortic valve regurgitation is mild. Mild aortic valve stenosis. Aortic valve mean gradient measures 17.0 mmHg.  6. The inferior vena cava is normal in size with greater than 50% respiratory variability, suggesting right atrial pressure of 3 mmHg. FINDINGS  Left Ventricle: Left ventricular ejection fraction, by estimation, is 60 to 65%. The left ventricle has normal function. The left ventricle has no regional wall motion abnormalities. The left ventricular internal cavity size was normal in size. There is  no left ventricular hypertrophy. Left ventricular diastolic parameters are consistent with Grade I diastolic dysfunction (impaired relaxation). Right Ventricle: The right ventricular size is normal. No increase in right ventricular wall thickness. Right ventricular systolic function is normal. There is mildly elevated pulmonary artery systolic pressure. The tricuspid regurgitant velocity is 2.97  m/s, and with an assumed right atrial pressure of 3 mmHg, the estimated right ventricular systolic pressure is 81.1 mmHg. Left Atrium: Left atrial size was mildly dilated. Right Atrium: Right atrial size was normal in size. Pericardium: There is no evidence of pericardial effusion. Mitral Valve: The mitral valve is myxomatous. There is mild late systolic prolapse of both leaflets of the mitral valve. There is moderate thickening of the mitral valve leaflet(s). Mild mitral valve regurgitation. No evidence of mitral valve stenosis. MV peak gradient, 5.4 mmHg. The mean mitral valve gradient is 2.0 mmHg. Tricuspid Valve: The tricuspid valve is not well visualized. Tricuspid valve regurgitation is mild. Aortic Valve: The aortic valve is tricuspid. There is moderate calcification of the aortic valve. There is moderate thickening of the aortic valve. Aortic valve regurgitation is mild. Aortic regurgitation PHT measures 573 msec. Mild aortic stenosis is present.  Aortic valve mean gradient measures 17.0 mmHg. Aortic valve peak gradient measures 30.5 mmHg. Aortic valve area, by VTI measures 1.80 cm. Pulmonic Valve: The pulmonic valve was not well visualized. Pulmonic valve regurgitation is not visualized. No evidence of pulmonic stenosis. Aorta: The aortic root is normal in size and structure. Pulmonary Artery: The pulmonary artery is not well seen. Venous: The inferior vena cava is normal in size with greater than 50% respiratory variability, suggesting right atrial pressure of 3 mmHg. IAS/Shunts: No atrial level shunt detected by color flow Doppler.  LEFT VENTRICLE PLAX 2D LVIDd:         3.90 cm  Diastology LVIDs:  2.40 cm  LV e' medial:    6.31 cm/s LV PW:         0.90 cm  LV E/e' medial:  13.4 LV IVS:        0.70 cm  LV e' lateral:   9.46 cm/s LVOT diam:     2.30 cm  LV E/e' lateral: 9.0 LV SV:         95 LV SV Index:   62 LVOT Area:     4.15 cm  RIGHT VENTRICLE RV Basal diam:  3.20 cm LEFT ATRIUM             Index       RIGHT ATRIUM           Index LA diam:        3.40 cm 2.24 cm/m  RA Area:     12.70 cm LA Vol (A2C):   58.9 ml 38.81 ml/m RA Volume:   27.90 ml  18.39 ml/m LA Vol (A4C):   57.1 ml 37.63 ml/m LA Biplane Vol: 57.8 ml 38.09 ml/m  AORTIC VALVE                    PULMONIC VALVE AV Area (Vmax):    1.78 cm     PV Vmax:       0.92 m/s AV Area (Vmean):   1.80 cm     PV Vmean:      66.000 cm/s AV Area (VTI):     1.80 cm     PV VTI:        0.177 m AV Vmax:           276.00 cm/s  PV Peak grad:  3.4 mmHg AV Vmean:          179.667 cm/s PV Mean grad:  2.0 mmHg AV VTI:            0.528 m AV Peak Grad:      30.5 mmHg AV Mean Grad:      17.0 mmHg LVOT Vmax:         118.00 cm/s LVOT Vmean:        77.900 cm/s LVOT VTI:          0.228 m LVOT/AV VTI ratio: 0.43 AI PHT:            573 msec  AORTA Ao Root diam: 3.40 cm MITRAL VALVE               TRICUSPID VALVE MV Area (PHT): 3.83 cm    TR Peak grad:   35.3 mmHg MV Area VTI:   3.48 cm    TR Vmax:         297.00 cm/s MV Peak grad:  5.4 mmHg MV Mean grad:  2.0 mmHg    SHUNTS MV Vmax:       1.16 m/s    Systemic VTI:  0.23 m MV Vmean:      74.4 cm/s   Systemic Diam: 2.30 cm MV Decel Time: 198 msec MV E velocity: 84.80 cm/s MV A velocity: 91.70 cm/s MV E/A ratio:  0.92 Harrell Gave End MD Electronically signed by Nelva Bush MD Signature Date/Time: 07/19/2020/9:46:26 AM    Final      Labs:   Basic Metabolic Panel: Recent Labs  Lab 07/14/20 1919 07/16/20 0614 07/17/20 0446  NA 137 132* 135  K 4.3 3.8 4.3  CL 103 99 102  CO2 23 26 26   GLUCOSE 148* 122* 124*  BUN 26* 23 25*  CREATININE 0.71 0.63 0.63  CALCIUM 8.8* 7.9* 8.2*   GFR Estimated Creatinine Clearance: 37.6 mL/min (by C-G formula based on SCr of 0.63 mg/dL). Liver Function Tests: Recent Labs  Lab 07/14/20 1919  AST 33  ALT 23  ALKPHOS 64  BILITOT 1.1  PROT 6.7  ALBUMIN 4.1   No results for input(s): LIPASE, AMYLASE in the last 168 hours. No results for input(s): AMMONIA in the last 168 hours. Coagulation profile Recent Labs  Lab 07/14/20 1919  INR 1.1    CBC: Recent Labs  Lab 07/14/20 1919 07/16/20 0614 07/17/20 0446 07/18/20 0512 07/19/20 0711  WBC 12.7* 10.3 11.7* 11.8* 11.1*  NEUTROABS 7.9*  --   --   --  8.0*  HGB 9.2* 7.7* 6.4* 6.7* 8.2*  HCT 27.0* 22.4* 18.9* 19.7* 23.9*  MCV 98.2 98.2 99.5 95.2 91.6  PLT 371 293 291 289 308   Cardiac Enzymes: No results for input(s): CKTOTAL, CKMB, CKMBINDEX, TROPONINI in the last 168 hours. BNP: Invalid input(s): POCBNP CBG: No results for input(s): GLUCAP in the last 168 hours. D-Dimer No results for input(s): DDIMER in the last 72 hours. Hgb A1c No results for input(s): HGBA1C in the last 72 hours. Lipid Profile No results for input(s): CHOL, HDL, LDLCALC, TRIG, CHOLHDL, LDLDIRECT in the last 72 hours. Thyroid function studies No results for input(s): TSH, T4TOTAL, T3FREE, THYROIDAB in the last 72 hours.  Invalid input(s): FREET3 Anemia work  up No results for input(s): VITAMINB12, FOLATE, FERRITIN, TIBC, IRON, RETICCTPCT in the last 72 hours. Microbiology Recent Results (from the past 240 hour(s))  Resp Panel by RT-PCR (Flu A&B, Covid) Nasopharyngeal Swab     Status: None   Collection Time: 07/14/20  7:53 PM   Specimen: Nasopharyngeal Swab; Nasopharyngeal(NP) swabs in vial transport medium  Result Value Ref Range Status   SARS Coronavirus 2 by RT PCR NEGATIVE NEGATIVE Final    Comment: (NOTE) SARS-CoV-2 target nucleic acids are NOT DETECTED.  The SARS-CoV-2 RNA is generally detectable in upper respiratory specimens during the acute phase of infection. The lowest concentration of SARS-CoV-2 viral copies this assay can detect is 138 copies/mL. A negative result does not preclude SARS-Cov-2 infection and should not be used as the sole basis for treatment or other patient management decisions. A negative result may occur with  improper specimen collection/handling, submission of specimen other than nasopharyngeal swab, presence of viral mutation(s) within the areas targeted by this assay, and inadequate number of viral copies(<138 copies/mL). A negative result must be combined with clinical observations, patient history, and epidemiological information. The expected result is Negative.  Fact Sheet for Patients:  EntrepreneurPulse.com.au  Fact Sheet for Healthcare Providers:  IncredibleEmployment.be  This test is no t yet approved or cleared by the [place] FDA and  has been authorized for detection and/or diagnosis of SARS-CoV-2 by FDA under an Emergency Use Authorization (EUA). This EUA will remain  in effect (meaning this test can be used) for the duration of the COVID-19 declaration under Section 564(b)(1) of the Act, 21 U.S.C.section 360bbb-3(b)(1), unless the authorization is terminated  or revoked sooner.       Influenza A by PCR NEGATIVE NEGATIVE Final   Influenza B by  PCR NEGATIVE NEGATIVE Final    Comment: (NOTE) The Xpert Xpress SARS-CoV-2/FLU/RSV plus assay is intended as an aid in the diagnosis of influenza from Nasopharyngeal swab specimens and should not be used as a sole basis for treatment. Nasal washings and aspirates  are unacceptable for Xpert Xpress SARS-CoV-2/FLU/RSV testing.  Fact Sheet for Patients: EntrepreneurPulse.com.au  Fact Sheet for Healthcare Providers: IncredibleEmployment.be  This test is not yet approved or cleared by the [place] FDA and has been authorized for detection and/or diagnosis of SARS-CoV-2 by FDA under an Emergency Use Authorization (EUA). This EUA will remain in effect (meaning this test can be used) for the duration of the COVID-19 declaration under Section 564(b)(1) of the Act, 21 U.S.C. section 360bbb-3(b)(1), unless the authorization is terminated or revoked.  Performed at [facility], [address] [place], [place] [address]      Discharge Instructions:   Discharge Instructions    Diet - low sodium heart healthy   Complete by: As directed    Discharge wound care:   Complete by: As directed    Follow-up with orthopedic surgeon in 1 week.   Increase activity slowly   Complete by: As directed      Allergies as of 07/19/2020      Reactions   Sulfonamide Derivatives Hives      Medication List    STOP taking these medications   multivitamin with minerals Tabs tablet   mupirocin ointment 2 % Commonly known as: Bactroban   omeprazole 20 MG tablet Commonly known as: PRILOSEC OTC   predniSONE 20 MG tablet Commonly known as: DELTASONE   PROBIOTIC DAILY PO     TAKE these medications   cholecalciferol 1000 units tablet Commonly known as: VITAMIN D Take 1,000 Units by mouth daily.   enoxaparin 40 MG/0.4ML injection Commonly known as: LOVENOX Inject 0.4 mLs (40 mg total) into the skin daily for 14 days. Start taking on: July 20, 2020   ferrous sulfate 325 (65 FE) MG tablet Take 1 tablet (325 mg total) by mouth every other day.   saccharomyces boulardii 250 MG capsule Commonly known as: FLORASTOR Take 250 mg by mouth daily.   traMADol 50 MG tablet Commonly known as: ULTRAM Take 1 tablet (50 mg total) by mouth every 6 (six) hours as needed.   trolamine salicylate 10 % cream Commonly known as: ASPERCREME Apply 1 application topically as needed for muscle pain.            Discharge Care Instructions  (From admission, onward)         Start     Ordered   07/19/20 0000  Discharge wound care:       Comments: Follow-up with orthopedic surgeon in 1 week.   07/19/20 1003          Contact information for follow-up providers    [place], MD. Schedule an appointment as soon as possible for a visit on 07/27/2020.   Specialty: Orthopedic Surgery Why: at [address] Contact information: [place] [address] [phone]            Contact information for after-discharge care    Destination    [facility].   Service: Skilled Nursing Contact information: [address] [place] [phone]                   Time coordinating discharge: 32 minutes  Signed:  [facility] 07/19/2020, 12:16 PM   Pager on [email or web address]. If 7PM-7AM, please contact night-coverage at [email or web address]

## 2020-07-19 NOTE — Plan of Care (Signed)
Ready for discharge

## 2020-07-19 NOTE — Progress Notes (Signed)
Physical Therapy Treatment Patient Details Name: Melissa [name] MRN: [id number] DOB: Sep 30, 1928 Today's Date: 07/19/2020    History of Present Illness Pt is an 87 y/o female who presents to [facility] following a fall at home on 07/14/20 resuliting in a R hip fracture. Pt is s/p intramedullary fixation of R intertrochanteric hip fracure. PMH: arthritis, chest pain, GERD, heart murmur, HA migraine, iron deficiecy and PONV.    PT Comments    Improved alertness and overall active participation with session this date.  Remains generally confused, but pleasant and cooperative throughout session.  Able to initiate short-distance gait training (10') with RW, mod assist +2.  Demonstrates 3-point, step to gait pattern; consistent manual facilitation for ant/lateral weight shift bilat ot unweight/advance LEs.  Fair loading/stance time R LE; constant cuing for postural extension and upward gaze.  Poor balance, very fearful of falling; intermittent assist to physcially advance R LE at times. REcommend use of RW and +2 for safety at all times.     Follow Up Recommendations  SNF     Equipment Recommendations  None recommended by PT    Recommendations for Other Services       Precautions / Restrictions Precautions Precautions: Fall Restrictions Weight Bearing Restrictions: Yes RLE Weight Bearing: Weight bearing as tolerated    Mobility  Bed Mobility Overal bed mobility: Needs Assistance Bed Mobility: Supine to Sit     Supine to sit: Mod assist;Max assist;+2 for physical assistance          Transfers Overall transfer level: Needs assistance Equipment used: Rolling walker (2 wheeled) Transfers: Sit to/from Stand Sit to Stand: Mod assist;+2 physical assistance         General transfer comment: step by step cuing for sequencing and task initiation; extensive assist for lift off an anterior weight translation to move COG over BOS  Ambulation/Gait Ambulation/Gait assistance: Mod assist;+2  physical assistance Gait Distance (Feet): 10 Feet Assistive device: Rolling walker (2 wheeled)       General Gait Details: 3-point, step to gait pattern; consistent manual facilitation for ant/lateral weight shift bilat ot unweight/advance LEs.  Fair loading/stance time R LE; constant cuing for postural extension and upward gaze.  Poor balance, very fearful of falling; intermittent assist to physcially advance R LE at times.   Stairs             Wheelchair Mobility    Modified Rankin (Stroke Patients Only)       Balance Overall balance assessment: Needs assistance Sitting-balance support: No upper extremity supported;Feet supported Sitting balance-Leahy Scale: Fair     Standing balance support: Bilateral upper extremity supported Standing balance-Leahy Scale: Poor                              Cognition Arousal/Alertness: Awake/alert Behavior During Therapy: WFL for tasks assessed/performed                                   General Comments: Oriented to self, general situation only; follows commands, but requires constant reorientation to task/situation at hand.  Limited recall of new information      Exercises Other Exercises Other Exercises: Sit/stand from edge of bed, recliner, mod assist +2 for lift off, anterior weight translation and overall standing balance Other Exercises: Bed/chair transfer, sPT with RW, mod/max assist +2. Manual assist to guide RW, initiate movement  General Comments        Pertinent Vitals/Pain Pain Assessment: Faces Faces Pain Scale: Hurts even more Pain Location: R hip with movement Pain Descriptors / Indicators: Aching;Grimacing;Guarding Pain Intervention(s): Limited activity within patient's tolerance;Monitored during session;Repositioned    Home Living                      Prior Function            PT Goals (current goals can now be found in the care plan section) Acute Rehab PT  Goals Patient Stated Goal: Decrease hip pain PT Goal Formulation: With patient Time For Goal Achievement: 07/29/20 Potential to Achieve Goals: Fair Progress towards PT goals: Progressing toward goals    Frequency    BID      PT Plan Current plan remains appropriate    Co-evaluation              AM-PAC PT "6 Clicks" Mobility   Outcome Measure  Help needed turning from your back to your side while in a flat bed without using bedrails?: A Lot Help needed moving from lying on your back to sitting on the side of a flat bed without using bedrails?: A Lot Help needed moving to and from a bed to a chair (including a wheelchair)?: A Lot Help needed standing up from a chair using your arms (e.g., wheelchair or bedside chair)?: A Lot Help needed to walk in hospital room?: A Lot Help needed climbing 3-5 steps with a railing? : Total 6 Click Score: 11    End of Session Equipment Utilized During Treatment: Gait belt Activity Tolerance: Patient tolerated treatment well Patient left: in chair;with call bell/phone within reach;with chair alarm set;with family/visitor present Nurse Communication: Mobility status PT Visit Diagnosis: Unsteadiness on feet (R26.81);Muscle weakness (generalized) (M62.81);Repeated falls (R29.6)     Time: 4034-7425 PT Time Calculation (min) (ACUTE ONLY): 24 min  Charges:  $Gait Training: 8-22 mins $Therapeutic Activity: 8-22 mins                     Ladanian Kelter H. Owens Shark, PT, DPT, NCS 07/19/20, 10:32 AM [phone]

## 2020-07-19 NOTE — Progress Notes (Signed)
Subjective:  POD #4 s/p intramedullary fixation for right intertrochanteric hip fracture.   Patient up out of bed to a chair eating lunch.  Her husband is at the bedside.   Patient reports right hip pain as mild.    Objective:   VITALS:   Vitals:   07/18/20 2324 07/19/20 0438 07/19/20 0756 07/19/20 1158  BP: 134/71 131/65 (!) 132/57 114/60  Pulse: 91 86 82 86  Resp: 17 17 16 16   Temp: 98.2 F (36.8 C) 98.5 F (36.9 C) 98.2 F (36.8 C) 98.1 F (36.7 C)  TempSrc: Oral Oral Oral Oral  SpO2: 94% 94% 94% 96%  Weight:      Height:        PHYSICAL EXAM: Right lower extremity Neurovascular intact Sensation intact distally Intact pulses distally Dorsiflexion/Plantar flexion intact Dressings: scant drainage No cellulitis present Compartment soft  LABS  Results for orders placed or performed during the hospital encounter of 07/14/20 (from the past 24 hour(s))  CBC with Differential/Platelet     Status: Abnormal   Collection Time: 07/19/20  7:11 AM  Result Value Ref Range   WBC 11.1 (H) 4.0 - 10.5 K/uL   RBC 2.61 (L) 3.87 - 5.11 MIL/uL   Hemoglobin 8.2 (L) 12.0 - 15.0 g/dL   HCT 23.9 (L) 36.0 - 46.0 %   MCV 91.6 80.0 - 100.0 fL   MCH 31.4 26.0 - 34.0 pg   MCHC 34.3 30.0 - 36.0 g/dL   RDW 19.4 (H) 11.5 - 15.5 %   Platelets 308 150 - 400 K/uL   nRBC 2.4 (H) 0.0 - 0.2 %   Neutrophils Relative % 73 %   Neutro Abs 8.0 (H) 1.7 - 7.7 K/uL   Lymphocytes Relative 10 %   Lymphs Abs 1.1 0.7 - 4.0 K/uL   Monocytes Relative 14 %   Monocytes Absolute 1.6 (H) 0.1 - 1.0 K/uL   Eosinophils Relative 0 %   Eosinophils Absolute 0.1 0.0 - 0.5 K/uL   Basophils Relative 1 %   Basophils Absolute 0.1 0.0 - 0.1 K/uL   Immature Granulocytes 2 %   Abs Immature Granulocytes 0.23 (H) 0.00 - 0.07 K/uL    ECHOCARDIOGRAM COMPLETE  Result Date: 07/19/2020    ECHOCARDIOGRAM REPORT   Patient Name:   NAHOMY LIMBURG Date of Exam: 07/19/2020 Medical Rec #:  [id number]      Height:       64.0 in  Accession #:    [id number]     Weight:       110.0 lb Date of Birth:  October 04, 1928       BSA:          1.517 m Patient Age:    87 years       BP:           131/65 mmHg Patient Gender: F              HR:           79 bpm. Exam Location:  [facility] Procedure: 2D Echo, Color Doppler and Cardiac Doppler Indications:     R01.1 Murmur  History:         Patient has prior history of Echocardiogram examinations.                  Signs/Symptoms:Murmur.  Sonographer:     Charmayne Sheer RDCS (AE) Referring Phys:  [id number] [place] Diagnosing Phys: Nelva Bush MD IMPRESSIONS  1. Left ventricular ejection fraction, by estimation,  is 60 to 65%. The left ventricle has normal function. The left ventricle has no regional wall motion abnormalities. Left ventricular diastolic parameters are consistent with Grade I diastolic dysfunction (impaired relaxation).  2. Right ventricular systolic function is normal. The right ventricular size is normal. There is mildly elevated pulmonary artery systolic pressure.  3. Left atrial size was mildly dilated.  4. The mitral valve is myxomatous. Mild mitral valve regurgitation. No evidence of mitral stenosis. There is mild late systolic prolapse of both leaflets of the mitral valve.  5. The aortic valve is tricuspid. There is moderate calcification of the aortic valve. There is moderate thickening of the aortic valve. Aortic valve regurgitation is mild. Mild aortic valve stenosis. Aortic valve mean gradient measures 17.0 mmHg.  6. The inferior vena cava is normal in size with greater than 50% respiratory variability, suggesting right atrial pressure of 3 mmHg. FINDINGS  Left Ventricle: Left ventricular ejection fraction, by estimation, is 60 to 65%. The left ventricle has normal function. The left ventricle has no regional wall motion abnormalities. The left ventricular internal cavity size was normal in size. There is  no left ventricular hypertrophy. Left ventricular diastolic parameters are  consistent with Grade I diastolic dysfunction (impaired relaxation). Right Ventricle: The right ventricular size is normal. No increase in right ventricular wall thickness. Right ventricular systolic function is normal. There is mildly elevated pulmonary artery systolic pressure. The tricuspid regurgitant velocity is 2.97  m/s, and with an assumed right atrial pressure of 3 mmHg, the estimated right ventricular systolic pressure is 27.7 mmHg. Left Atrium: Left atrial size was mildly dilated. Right Atrium: Right atrial size was normal in size. Pericardium: There is no evidence of pericardial effusion. Mitral Valve: The mitral valve is myxomatous. There is mild late systolic prolapse of both leaflets of the mitral valve. There is moderate thickening of the mitral valve leaflet(s). Mild mitral valve regurgitation. No evidence of mitral valve stenosis. MV peak gradient, 5.4 mmHg. The mean mitral valve gradient is 2.0 mmHg. Tricuspid Valve: The tricuspid valve is not well visualized. Tricuspid valve regurgitation is mild. Aortic Valve: The aortic valve is tricuspid. There is moderate calcification of the aortic valve. There is moderate thickening of the aortic valve. Aortic valve regurgitation is mild. Aortic regurgitation PHT measures 573 msec. Mild aortic stenosis is present. Aortic valve mean gradient measures 17.0 mmHg. Aortic valve peak gradient measures 30.5 mmHg. Aortic valve area, by VTI measures 1.80 cm. Pulmonic Valve: The pulmonic valve was not well visualized. Pulmonic valve regurgitation is not visualized. No evidence of pulmonic stenosis. Aorta: The aortic root is normal in size and structure. Pulmonary Artery: The pulmonary artery is not well seen. Venous: The inferior vena cava is normal in size with greater than 50% respiratory variability, suggesting right atrial pressure of 3 mmHg. IAS/Shunts: No atrial level shunt detected by color flow Doppler.  LEFT VENTRICLE PLAX 2D LVIDd:         3.90 cm   Diastology LVIDs:         2.40 cm  LV e' medial:    6.31 cm/s LV PW:         0.90 cm  LV E/e' medial:  13.4 LV IVS:        0.70 cm  LV e' lateral:   9.46 cm/s LVOT diam:     2.30 cm  LV E/e' lateral: 9.0 LV SV:         95 LV SV Index:   62 LVOT Area:  4.15 cm  RIGHT VENTRICLE RV Basal diam:  3.20 cm LEFT ATRIUM             Index       RIGHT ATRIUM           Index LA diam:        3.40 cm 2.24 cm/m  RA Area:     12.70 cm LA Vol (A2C):   58.9 ml 38.81 ml/m RA Volume:   27.90 ml  18.39 ml/m LA Vol (A4C):   57.1 ml 37.63 ml/m LA Biplane Vol: 57.8 ml 38.09 ml/m  AORTIC VALVE                    PULMONIC VALVE AV Area (Vmax):    1.78 cm     PV Vmax:       0.92 m/s AV Area (Vmean):   1.80 cm     PV Vmean:      66.000 cm/s AV Area (VTI):     1.80 cm     PV VTI:        0.177 m AV Vmax:           276.00 cm/s  PV Peak grad:  3.4 mmHg AV Vmean:          179.667 cm/s PV Mean grad:  2.0 mmHg AV VTI:            0.528 m AV Peak Grad:      30.5 mmHg AV Mean Grad:      17.0 mmHg LVOT Vmax:         118.00 cm/s LVOT Vmean:        77.900 cm/s LVOT VTI:          0.228 m LVOT/AV VTI ratio: 0.43 AI PHT:            573 msec  AORTA Ao Root diam: 3.40 cm MITRAL VALVE               TRICUSPID VALVE MV Area (PHT): 3.83 cm    TR Peak grad:   35.3 mmHg MV Area VTI:   3.48 cm    TR Vmax:        297.00 cm/s MV Peak grad:  5.4 mmHg MV Mean grad:  2.0 mmHg    SHUNTS MV Vmax:       1.16 m/s    Systemic VTI:  0.23 m MV Vmean:      74.4 cm/s   Systemic Diam: 2.30 cm MV Decel Time: 198 msec MV E velocity: 84.80 cm/s MV A velocity: 91.70 cm/s MV E/A ratio:  0.92 Harrell Gave End MD Electronically signed by Nelva Bush MD Signature Date/Time: 07/19/2020/9:46:26 AM    Final     Assessment/Plan: 4 Days Post-Op   Principal Problem:   Closed intertrochanteric fracture of hip, right, initial encounter ([place]) Active Problems:   ANEMIA, IRON DEFICIENCY   Accidental fall  Patient doing well postop.  Plan for discharge to a skilled nursing  facility today.  Patient will continue physical therapy.  She may weight-bear as tolerated on the right lower extremity.  Patient should continue Lovenox 40 mg daily for 2 weeks at [facility].  Patient will follow-up in my office in 10 to 14 days for wound check, staple removal and x-rays.    Thornton Park , MD 07/19/2020, 1:02 PM

## 2020-07-19 NOTE — TOC Transition Note (Signed)
Transition of Care [facility]) - CM/SW Discharge Note   Patient Details  Name: CAYLEEN BENJAMIN MRN: [id number] Date of Birth: 1928-07-17  [facility] CM/SW Contact:  Pete Pelt, RN Phone Number: 07/19/2020, 2:55 PM   Clinical Narrative:   Patient returning to [facility], room 112 Per Seth Bake.  EMS contacted, patient is 4th on the list to go.  No questions from patient.       Barriers to Discharge: Continued Medical Work up   Patient Goals and CMS Choice        Discharge Placement                       Discharge Plan and Services     Post Acute Care Choice:  ([place])                               Social Determinants of Health (SDOH) Interventions     Readmission Risk Interventions No flowsheet data found.

## 2020-07-19 NOTE — Progress Notes (Signed)
*  PRELIMINARY RESULTS* Echocardiogram 2D Echocardiogram has been performed.  Melissa [name] 07/19/2020, 8:42 AM

## 2020-07-19 NOTE — Progress Notes (Signed)
Report called to vicky rn at [facility]

## 2020-07-21 DIAGNOSIS — K219 Gastro-esophageal reflux disease without esophagitis: Secondary | ICD-10-CM | POA: Diagnosis not present

## 2020-07-21 DIAGNOSIS — S72001A Fracture of unspecified part of neck of right femur, initial encounter for closed fracture: Secondary | ICD-10-CM | POA: Diagnosis not present

## 2020-07-21 DIAGNOSIS — F0150 Vascular dementia without behavioral disturbance: Secondary | ICD-10-CM | POA: Diagnosis not present

## 2020-07-21 DIAGNOSIS — M5117 Intervertebral disc disorders with radiculopathy, lumbosacral region: Secondary | ICD-10-CM | POA: Diagnosis not present

## 2020-07-27 DIAGNOSIS — D649 Anemia, unspecified: Secondary | ICD-10-CM | POA: Diagnosis not present

## 2020-07-27 DIAGNOSIS — S72001A Fracture of unspecified part of neck of right femur, initial encounter for closed fracture: Secondary | ICD-10-CM | POA: Diagnosis not present

## 2020-07-27 DIAGNOSIS — M9701XA Periprosthetic fracture around internal prosthetic right hip joint, initial encounter: Secondary | ICD-10-CM | POA: Diagnosis not present

## 2020-08-23 DIAGNOSIS — Z4789 Encounter for other orthopedic aftercare: Secondary | ICD-10-CM | POA: Diagnosis not present

## 2020-08-23 DIAGNOSIS — M545 Low back pain, unspecified: Secondary | ICD-10-CM | POA: Diagnosis not present

## 2020-08-23 DIAGNOSIS — S72001D Fracture of unspecified part of neck of right femur, subsequent encounter for closed fracture with routine healing: Secondary | ICD-10-CM | POA: Diagnosis not present

## 2020-08-23 DIAGNOSIS — S72141D Displaced intertrochanteric fracture of right femur, subsequent encounter for closed fracture with routine healing: Secondary | ICD-10-CM | POA: Diagnosis not present

## 2020-08-23 DIAGNOSIS — F0390 Unspecified dementia without behavioral disturbance: Secondary | ICD-10-CM | POA: Diagnosis not present

## 2020-08-23 DIAGNOSIS — R293 Abnormal posture: Secondary | ICD-10-CM | POA: Diagnosis not present

## 2020-08-23 DIAGNOSIS — R262 Difficulty in walking, not elsewhere classified: Secondary | ICD-10-CM | POA: Diagnosis not present

## 2020-08-23 DIAGNOSIS — W19XXXD Unspecified fall, subsequent encounter: Secondary | ICD-10-CM | POA: Diagnosis not present

## 2020-08-23 DIAGNOSIS — R278 Other lack of coordination: Secondary | ICD-10-CM | POA: Diagnosis not present

## 2020-08-23 DIAGNOSIS — R4189 Other symptoms and signs involving cognitive functions and awareness: Secondary | ICD-10-CM | POA: Diagnosis not present

## 2020-08-23 DIAGNOSIS — M6281 Muscle weakness (generalized): Secondary | ICD-10-CM | POA: Diagnosis not present

## 2020-08-23 DIAGNOSIS — M5442 Lumbago with sciatica, left side: Secondary | ICD-10-CM | POA: Diagnosis not present

## 2020-08-23 DIAGNOSIS — Z741 Need for assistance with personal care: Secondary | ICD-10-CM | POA: Diagnosis not present

## 2020-08-25 DIAGNOSIS — W19XXXD Unspecified fall, subsequent encounter: Secondary | ICD-10-CM | POA: Diagnosis not present

## 2020-08-25 DIAGNOSIS — S72001D Fracture of unspecified part of neck of right femur, subsequent encounter for closed fracture with routine healing: Secondary | ICD-10-CM | POA: Diagnosis not present

## 2020-08-25 DIAGNOSIS — R293 Abnormal posture: Secondary | ICD-10-CM | POA: Diagnosis not present

## 2020-08-25 DIAGNOSIS — Z4789 Encounter for other orthopedic aftercare: Secondary | ICD-10-CM | POA: Diagnosis not present

## 2020-08-25 DIAGNOSIS — M5442 Lumbago with sciatica, left side: Secondary | ICD-10-CM | POA: Diagnosis not present

## 2020-08-25 DIAGNOSIS — S72141D Displaced intertrochanteric fracture of right femur, subsequent encounter for closed fracture with routine healing: Secondary | ICD-10-CM | POA: Diagnosis not present

## 2020-08-29 DIAGNOSIS — S72141D Displaced intertrochanteric fracture of right femur, subsequent encounter for closed fracture with routine healing: Secondary | ICD-10-CM | POA: Diagnosis not present

## 2020-08-29 DIAGNOSIS — W19XXXD Unspecified fall, subsequent encounter: Secondary | ICD-10-CM | POA: Diagnosis not present

## 2020-08-29 DIAGNOSIS — M5442 Lumbago with sciatica, left side: Secondary | ICD-10-CM | POA: Diagnosis not present

## 2020-08-29 DIAGNOSIS — R293 Abnormal posture: Secondary | ICD-10-CM | POA: Diagnosis not present

## 2020-08-29 DIAGNOSIS — S72001D Fracture of unspecified part of neck of right femur, subsequent encounter for closed fracture with routine healing: Secondary | ICD-10-CM | POA: Diagnosis not present

## 2020-08-29 DIAGNOSIS — Z4789 Encounter for other orthopedic aftercare: Secondary | ICD-10-CM | POA: Diagnosis not present

## 2020-09-01 DIAGNOSIS — S72141D Displaced intertrochanteric fracture of right femur, subsequent encounter for closed fracture with routine healing: Secondary | ICD-10-CM | POA: Diagnosis not present

## 2020-09-01 DIAGNOSIS — R293 Abnormal posture: Secondary | ICD-10-CM | POA: Diagnosis not present

## 2020-09-01 DIAGNOSIS — W19XXXD Unspecified fall, subsequent encounter: Secondary | ICD-10-CM | POA: Diagnosis not present

## 2020-09-01 DIAGNOSIS — M5442 Lumbago with sciatica, left side: Secondary | ICD-10-CM | POA: Diagnosis not present

## 2020-09-01 DIAGNOSIS — S72001D Fracture of unspecified part of neck of right femur, subsequent encounter for closed fracture with routine healing: Secondary | ICD-10-CM | POA: Diagnosis not present

## 2020-09-01 DIAGNOSIS — Z4789 Encounter for other orthopedic aftercare: Secondary | ICD-10-CM | POA: Diagnosis not present

## 2020-09-05 DIAGNOSIS — Z4789 Encounter for other orthopedic aftercare: Secondary | ICD-10-CM | POA: Diagnosis not present

## 2020-09-05 DIAGNOSIS — S72141D Displaced intertrochanteric fracture of right femur, subsequent encounter for closed fracture with routine healing: Secondary | ICD-10-CM | POA: Diagnosis not present

## 2020-09-05 DIAGNOSIS — S72001D Fracture of unspecified part of neck of right femur, subsequent encounter for closed fracture with routine healing: Secondary | ICD-10-CM | POA: Diagnosis not present

## 2020-09-05 DIAGNOSIS — M5442 Lumbago with sciatica, left side: Secondary | ICD-10-CM | POA: Diagnosis not present

## 2020-09-05 DIAGNOSIS — W19XXXD Unspecified fall, subsequent encounter: Secondary | ICD-10-CM | POA: Diagnosis not present

## 2020-09-05 DIAGNOSIS — R293 Abnormal posture: Secondary | ICD-10-CM | POA: Diagnosis not present

## 2020-09-06 DIAGNOSIS — Z4789 Encounter for other orthopedic aftercare: Secondary | ICD-10-CM | POA: Diagnosis not present

## 2020-09-06 DIAGNOSIS — W19XXXD Unspecified fall, subsequent encounter: Secondary | ICD-10-CM | POA: Diagnosis not present

## 2020-09-06 DIAGNOSIS — S72001D Fracture of unspecified part of neck of right femur, subsequent encounter for closed fracture with routine healing: Secondary | ICD-10-CM | POA: Diagnosis not present

## 2020-09-06 DIAGNOSIS — S72141D Displaced intertrochanteric fracture of right femur, subsequent encounter for closed fracture with routine healing: Secondary | ICD-10-CM | POA: Diagnosis not present

## 2020-09-06 DIAGNOSIS — R293 Abnormal posture: Secondary | ICD-10-CM | POA: Diagnosis not present

## 2020-09-06 DIAGNOSIS — M5442 Lumbago with sciatica, left side: Secondary | ICD-10-CM | POA: Diagnosis not present

## 2020-09-07 DIAGNOSIS — M5442 Lumbago with sciatica, left side: Secondary | ICD-10-CM | POA: Diagnosis not present

## 2020-09-07 DIAGNOSIS — S72141D Displaced intertrochanteric fracture of right femur, subsequent encounter for closed fracture with routine healing: Secondary | ICD-10-CM | POA: Diagnosis not present

## 2020-09-07 DIAGNOSIS — S72001D Fracture of unspecified part of neck of right femur, subsequent encounter for closed fracture with routine healing: Secondary | ICD-10-CM | POA: Diagnosis not present

## 2020-09-07 DIAGNOSIS — R293 Abnormal posture: Secondary | ICD-10-CM | POA: Diagnosis not present

## 2020-09-07 DIAGNOSIS — W19XXXD Unspecified fall, subsequent encounter: Secondary | ICD-10-CM | POA: Diagnosis not present

## 2020-09-07 DIAGNOSIS — Z4789 Encounter for other orthopedic aftercare: Secondary | ICD-10-CM | POA: Diagnosis not present

## 2020-09-09 DIAGNOSIS — S72001A Fracture of unspecified part of neck of right femur, initial encounter for closed fracture: Secondary | ICD-10-CM | POA: Diagnosis not present

## 2020-09-09 DIAGNOSIS — Z4789 Encounter for other orthopedic aftercare: Secondary | ICD-10-CM | POA: Diagnosis not present

## 2020-09-09 DIAGNOSIS — S72001D Fracture of unspecified part of neck of right femur, subsequent encounter for closed fracture with routine healing: Secondary | ICD-10-CM | POA: Diagnosis not present

## 2020-09-09 DIAGNOSIS — S72141D Displaced intertrochanteric fracture of right femur, subsequent encounter for closed fracture with routine healing: Secondary | ICD-10-CM | POA: Diagnosis not present

## 2020-09-09 DIAGNOSIS — R293 Abnormal posture: Secondary | ICD-10-CM | POA: Diagnosis not present

## 2020-09-09 DIAGNOSIS — M5442 Lumbago with sciatica, left side: Secondary | ICD-10-CM | POA: Diagnosis not present

## 2020-09-09 DIAGNOSIS — W19XXXD Unspecified fall, subsequent encounter: Secondary | ICD-10-CM | POA: Diagnosis not present

## 2020-09-13 DIAGNOSIS — R293 Abnormal posture: Secondary | ICD-10-CM | POA: Diagnosis not present

## 2020-09-13 DIAGNOSIS — Z4789 Encounter for other orthopedic aftercare: Secondary | ICD-10-CM | POA: Diagnosis not present

## 2020-09-13 DIAGNOSIS — W19XXXD Unspecified fall, subsequent encounter: Secondary | ICD-10-CM | POA: Diagnosis not present

## 2020-09-13 DIAGNOSIS — S72141D Displaced intertrochanteric fracture of right femur, subsequent encounter for closed fracture with routine healing: Secondary | ICD-10-CM | POA: Diagnosis not present

## 2020-09-13 DIAGNOSIS — S72001D Fracture of unspecified part of neck of right femur, subsequent encounter for closed fracture with routine healing: Secondary | ICD-10-CM | POA: Diagnosis not present

## 2020-09-13 DIAGNOSIS — M5442 Lumbago with sciatica, left side: Secondary | ICD-10-CM | POA: Diagnosis not present

## 2020-09-14 DIAGNOSIS — R293 Abnormal posture: Secondary | ICD-10-CM | POA: Diagnosis not present

## 2020-09-14 DIAGNOSIS — S72141D Displaced intertrochanteric fracture of right femur, subsequent encounter for closed fracture with routine healing: Secondary | ICD-10-CM | POA: Diagnosis not present

## 2020-09-14 DIAGNOSIS — M5442 Lumbago with sciatica, left side: Secondary | ICD-10-CM | POA: Diagnosis not present

## 2020-09-14 DIAGNOSIS — W19XXXD Unspecified fall, subsequent encounter: Secondary | ICD-10-CM | POA: Diagnosis not present

## 2020-09-14 DIAGNOSIS — Z4789 Encounter for other orthopedic aftercare: Secondary | ICD-10-CM | POA: Diagnosis not present

## 2020-09-14 DIAGNOSIS — S72001D Fracture of unspecified part of neck of right femur, subsequent encounter for closed fracture with routine healing: Secondary | ICD-10-CM | POA: Diagnosis not present

## 2020-09-15 DIAGNOSIS — M5442 Lumbago with sciatica, left side: Secondary | ICD-10-CM | POA: Diagnosis not present

## 2020-09-15 DIAGNOSIS — R293 Abnormal posture: Secondary | ICD-10-CM | POA: Diagnosis not present

## 2020-09-15 DIAGNOSIS — W19XXXD Unspecified fall, subsequent encounter: Secondary | ICD-10-CM | POA: Diagnosis not present

## 2020-09-15 DIAGNOSIS — Z4789 Encounter for other orthopedic aftercare: Secondary | ICD-10-CM | POA: Diagnosis not present

## 2020-09-15 DIAGNOSIS — S72141D Displaced intertrochanteric fracture of right femur, subsequent encounter for closed fracture with routine healing: Secondary | ICD-10-CM | POA: Diagnosis not present

## 2020-09-15 DIAGNOSIS — S72001D Fracture of unspecified part of neck of right femur, subsequent encounter for closed fracture with routine healing: Secondary | ICD-10-CM | POA: Diagnosis not present

## 2020-09-16 DIAGNOSIS — S72001D Fracture of unspecified part of neck of right femur, subsequent encounter for closed fracture with routine healing: Secondary | ICD-10-CM | POA: Diagnosis not present

## 2020-09-16 DIAGNOSIS — W19XXXD Unspecified fall, subsequent encounter: Secondary | ICD-10-CM | POA: Diagnosis not present

## 2020-09-16 DIAGNOSIS — Z4789 Encounter for other orthopedic aftercare: Secondary | ICD-10-CM | POA: Diagnosis not present

## 2020-09-16 DIAGNOSIS — R293 Abnormal posture: Secondary | ICD-10-CM | POA: Diagnosis not present

## 2020-09-16 DIAGNOSIS — S72141D Displaced intertrochanteric fracture of right femur, subsequent encounter for closed fracture with routine healing: Secondary | ICD-10-CM | POA: Diagnosis not present

## 2020-09-16 DIAGNOSIS — M5442 Lumbago with sciatica, left side: Secondary | ICD-10-CM | POA: Diagnosis not present

## 2020-09-19 DIAGNOSIS — Z4789 Encounter for other orthopedic aftercare: Secondary | ICD-10-CM | POA: Diagnosis not present

## 2020-09-19 DIAGNOSIS — R4189 Other symptoms and signs involving cognitive functions and awareness: Secondary | ICD-10-CM | POA: Diagnosis not present

## 2020-09-19 DIAGNOSIS — S72141D Displaced intertrochanteric fracture of right femur, subsequent encounter for closed fracture with routine healing: Secondary | ICD-10-CM | POA: Diagnosis not present

## 2020-09-19 DIAGNOSIS — R262 Difficulty in walking, not elsewhere classified: Secondary | ICD-10-CM | POA: Diagnosis not present

## 2020-09-19 DIAGNOSIS — R278 Other lack of coordination: Secondary | ICD-10-CM | POA: Diagnosis not present

## 2020-09-19 DIAGNOSIS — M5442 Lumbago with sciatica, left side: Secondary | ICD-10-CM | POA: Diagnosis not present

## 2020-09-19 DIAGNOSIS — F0390 Unspecified dementia without behavioral disturbance: Secondary | ICD-10-CM | POA: Diagnosis not present

## 2020-09-19 DIAGNOSIS — W19XXXD Unspecified fall, subsequent encounter: Secondary | ICD-10-CM | POA: Diagnosis not present

## 2020-09-19 DIAGNOSIS — Z741 Need for assistance with personal care: Secondary | ICD-10-CM | POA: Diagnosis not present

## 2020-09-19 DIAGNOSIS — S72001D Fracture of unspecified part of neck of right femur, subsequent encounter for closed fracture with routine healing: Secondary | ICD-10-CM | POA: Diagnosis not present

## 2020-09-19 DIAGNOSIS — M6281 Muscle weakness (generalized): Secondary | ICD-10-CM | POA: Diagnosis not present

## 2020-09-19 DIAGNOSIS — R293 Abnormal posture: Secondary | ICD-10-CM | POA: Diagnosis not present

## 2020-09-19 DIAGNOSIS — M545 Low back pain, unspecified: Secondary | ICD-10-CM | POA: Diagnosis not present

## 2020-09-20 DIAGNOSIS — Z4789 Encounter for other orthopedic aftercare: Secondary | ICD-10-CM | POA: Diagnosis not present

## 2020-09-20 DIAGNOSIS — S72141D Displaced intertrochanteric fracture of right femur, subsequent encounter for closed fracture with routine healing: Secondary | ICD-10-CM | POA: Diagnosis not present

## 2020-09-20 DIAGNOSIS — R293 Abnormal posture: Secondary | ICD-10-CM | POA: Diagnosis not present

## 2020-09-20 DIAGNOSIS — W19XXXD Unspecified fall, subsequent encounter: Secondary | ICD-10-CM | POA: Diagnosis not present

## 2020-09-20 DIAGNOSIS — S72001D Fracture of unspecified part of neck of right femur, subsequent encounter for closed fracture with routine healing: Secondary | ICD-10-CM | POA: Diagnosis not present

## 2020-09-20 DIAGNOSIS — M5442 Lumbago with sciatica, left side: Secondary | ICD-10-CM | POA: Diagnosis not present

## 2020-09-21 DIAGNOSIS — S72001D Fracture of unspecified part of neck of right femur, subsequent encounter for closed fracture with routine healing: Secondary | ICD-10-CM | POA: Diagnosis not present

## 2020-09-21 DIAGNOSIS — S72141D Displaced intertrochanteric fracture of right femur, subsequent encounter for closed fracture with routine healing: Secondary | ICD-10-CM | POA: Diagnosis not present

## 2020-09-21 DIAGNOSIS — Z4789 Encounter for other orthopedic aftercare: Secondary | ICD-10-CM | POA: Diagnosis not present

## 2020-09-21 DIAGNOSIS — R293 Abnormal posture: Secondary | ICD-10-CM | POA: Diagnosis not present

## 2020-09-21 DIAGNOSIS — W19XXXD Unspecified fall, subsequent encounter: Secondary | ICD-10-CM | POA: Diagnosis not present

## 2020-09-21 DIAGNOSIS — M5442 Lumbago with sciatica, left side: Secondary | ICD-10-CM | POA: Diagnosis not present

## 2020-09-22 DIAGNOSIS — Z4789 Encounter for other orthopedic aftercare: Secondary | ICD-10-CM | POA: Diagnosis not present

## 2020-09-22 DIAGNOSIS — W19XXXD Unspecified fall, subsequent encounter: Secondary | ICD-10-CM | POA: Diagnosis not present

## 2020-09-22 DIAGNOSIS — S72001D Fracture of unspecified part of neck of right femur, subsequent encounter for closed fracture with routine healing: Secondary | ICD-10-CM | POA: Diagnosis not present

## 2020-09-22 DIAGNOSIS — M5442 Lumbago with sciatica, left side: Secondary | ICD-10-CM | POA: Diagnosis not present

## 2020-09-22 DIAGNOSIS — R293 Abnormal posture: Secondary | ICD-10-CM | POA: Diagnosis not present

## 2020-09-22 DIAGNOSIS — S72141D Displaced intertrochanteric fracture of right femur, subsequent encounter for closed fracture with routine healing: Secondary | ICD-10-CM | POA: Diagnosis not present

## 2020-09-27 DIAGNOSIS — M5442 Lumbago with sciatica, left side: Secondary | ICD-10-CM | POA: Diagnosis not present

## 2020-09-27 DIAGNOSIS — S72001D Fracture of unspecified part of neck of right femur, subsequent encounter for closed fracture with routine healing: Secondary | ICD-10-CM | POA: Diagnosis not present

## 2020-09-27 DIAGNOSIS — Z4789 Encounter for other orthopedic aftercare: Secondary | ICD-10-CM | POA: Diagnosis not present

## 2020-09-27 DIAGNOSIS — W19XXXD Unspecified fall, subsequent encounter: Secondary | ICD-10-CM | POA: Diagnosis not present

## 2020-09-27 DIAGNOSIS — S72141D Displaced intertrochanteric fracture of right femur, subsequent encounter for closed fracture with routine healing: Secondary | ICD-10-CM | POA: Diagnosis not present

## 2020-09-27 DIAGNOSIS — R293 Abnormal posture: Secondary | ICD-10-CM | POA: Diagnosis not present

## 2020-09-28 DIAGNOSIS — M5442 Lumbago with sciatica, left side: Secondary | ICD-10-CM | POA: Diagnosis not present

## 2020-09-28 DIAGNOSIS — W19XXXD Unspecified fall, subsequent encounter: Secondary | ICD-10-CM | POA: Diagnosis not present

## 2020-09-28 DIAGNOSIS — S72001D Fracture of unspecified part of neck of right femur, subsequent encounter for closed fracture with routine healing: Secondary | ICD-10-CM | POA: Diagnosis not present

## 2020-09-28 DIAGNOSIS — R293 Abnormal posture: Secondary | ICD-10-CM | POA: Diagnosis not present

## 2020-09-28 DIAGNOSIS — S72141D Displaced intertrochanteric fracture of right femur, subsequent encounter for closed fracture with routine healing: Secondary | ICD-10-CM | POA: Diagnosis not present

## 2020-09-28 DIAGNOSIS — Z4789 Encounter for other orthopedic aftercare: Secondary | ICD-10-CM | POA: Diagnosis not present

## 2020-09-29 DIAGNOSIS — S72141D Displaced intertrochanteric fracture of right femur, subsequent encounter for closed fracture with routine healing: Secondary | ICD-10-CM | POA: Diagnosis not present

## 2020-09-29 DIAGNOSIS — W19XXXD Unspecified fall, subsequent encounter: Secondary | ICD-10-CM | POA: Diagnosis not present

## 2020-09-29 DIAGNOSIS — R293 Abnormal posture: Secondary | ICD-10-CM | POA: Diagnosis not present

## 2020-09-29 DIAGNOSIS — S72001D Fracture of unspecified part of neck of right femur, subsequent encounter for closed fracture with routine healing: Secondary | ICD-10-CM | POA: Diagnosis not present

## 2020-09-29 DIAGNOSIS — Z4789 Encounter for other orthopedic aftercare: Secondary | ICD-10-CM | POA: Diagnosis not present

## 2020-09-29 DIAGNOSIS — M5442 Lumbago with sciatica, left side: Secondary | ICD-10-CM | POA: Diagnosis not present

## 2020-09-30 DIAGNOSIS — Z4789 Encounter for other orthopedic aftercare: Secondary | ICD-10-CM | POA: Diagnosis not present

## 2020-09-30 DIAGNOSIS — S72001D Fracture of unspecified part of neck of right femur, subsequent encounter for closed fracture with routine healing: Secondary | ICD-10-CM | POA: Diagnosis not present

## 2020-09-30 DIAGNOSIS — S72141D Displaced intertrochanteric fracture of right femur, subsequent encounter for closed fracture with routine healing: Secondary | ICD-10-CM | POA: Diagnosis not present

## 2020-09-30 DIAGNOSIS — M5442 Lumbago with sciatica, left side: Secondary | ICD-10-CM | POA: Diagnosis not present

## 2020-09-30 DIAGNOSIS — R293 Abnormal posture: Secondary | ICD-10-CM | POA: Diagnosis not present

## 2020-09-30 DIAGNOSIS — W19XXXD Unspecified fall, subsequent encounter: Secondary | ICD-10-CM | POA: Diagnosis not present

## 2020-10-03 DIAGNOSIS — Z4789 Encounter for other orthopedic aftercare: Secondary | ICD-10-CM | POA: Diagnosis not present

## 2020-10-03 DIAGNOSIS — R293 Abnormal posture: Secondary | ICD-10-CM | POA: Diagnosis not present

## 2020-10-03 DIAGNOSIS — W19XXXD Unspecified fall, subsequent encounter: Secondary | ICD-10-CM | POA: Diagnosis not present

## 2020-10-03 DIAGNOSIS — S72141D Displaced intertrochanteric fracture of right femur, subsequent encounter for closed fracture with routine healing: Secondary | ICD-10-CM | POA: Diagnosis not present

## 2020-10-03 DIAGNOSIS — S72001D Fracture of unspecified part of neck of right femur, subsequent encounter for closed fracture with routine healing: Secondary | ICD-10-CM | POA: Diagnosis not present

## 2020-10-03 DIAGNOSIS — M5442 Lumbago with sciatica, left side: Secondary | ICD-10-CM | POA: Diagnosis not present

## 2020-10-04 DIAGNOSIS — W19XXXD Unspecified fall, subsequent encounter: Secondary | ICD-10-CM | POA: Diagnosis not present

## 2020-10-04 DIAGNOSIS — M5442 Lumbago with sciatica, left side: Secondary | ICD-10-CM | POA: Diagnosis not present

## 2020-10-04 DIAGNOSIS — S72141D Displaced intertrochanteric fracture of right femur, subsequent encounter for closed fracture with routine healing: Secondary | ICD-10-CM | POA: Diagnosis not present

## 2020-10-04 DIAGNOSIS — S72001D Fracture of unspecified part of neck of right femur, subsequent encounter for closed fracture with routine healing: Secondary | ICD-10-CM | POA: Diagnosis not present

## 2020-10-04 DIAGNOSIS — Z4789 Encounter for other orthopedic aftercare: Secondary | ICD-10-CM | POA: Diagnosis not present

## 2020-10-04 DIAGNOSIS — R293 Abnormal posture: Secondary | ICD-10-CM | POA: Diagnosis not present

## 2020-10-05 DIAGNOSIS — W19XXXD Unspecified fall, subsequent encounter: Secondary | ICD-10-CM | POA: Diagnosis not present

## 2020-10-05 DIAGNOSIS — S72141D Displaced intertrochanteric fracture of right femur, subsequent encounter for closed fracture with routine healing: Secondary | ICD-10-CM | POA: Diagnosis not present

## 2020-10-05 DIAGNOSIS — M5442 Lumbago with sciatica, left side: Secondary | ICD-10-CM | POA: Diagnosis not present

## 2020-10-05 DIAGNOSIS — Z4789 Encounter for other orthopedic aftercare: Secondary | ICD-10-CM | POA: Diagnosis not present

## 2020-10-05 DIAGNOSIS — S72001D Fracture of unspecified part of neck of right femur, subsequent encounter for closed fracture with routine healing: Secondary | ICD-10-CM | POA: Diagnosis not present

## 2020-10-05 DIAGNOSIS — R293 Abnormal posture: Secondary | ICD-10-CM | POA: Diagnosis not present

## 2020-10-06 DIAGNOSIS — R293 Abnormal posture: Secondary | ICD-10-CM | POA: Diagnosis not present

## 2020-10-06 DIAGNOSIS — Z4789 Encounter for other orthopedic aftercare: Secondary | ICD-10-CM | POA: Diagnosis not present

## 2020-10-06 DIAGNOSIS — W19XXXD Unspecified fall, subsequent encounter: Secondary | ICD-10-CM | POA: Diagnosis not present

## 2020-10-06 DIAGNOSIS — S72141D Displaced intertrochanteric fracture of right femur, subsequent encounter for closed fracture with routine healing: Secondary | ICD-10-CM | POA: Diagnosis not present

## 2020-10-06 DIAGNOSIS — M5442 Lumbago with sciatica, left side: Secondary | ICD-10-CM | POA: Diagnosis not present

## 2020-10-06 DIAGNOSIS — S72001D Fracture of unspecified part of neck of right femur, subsequent encounter for closed fracture with routine healing: Secondary | ICD-10-CM | POA: Diagnosis not present

## 2020-10-07 DIAGNOSIS — R293 Abnormal posture: Secondary | ICD-10-CM | POA: Diagnosis not present

## 2020-10-07 DIAGNOSIS — S72141D Displaced intertrochanteric fracture of right femur, subsequent encounter for closed fracture with routine healing: Secondary | ICD-10-CM | POA: Diagnosis not present

## 2020-10-07 DIAGNOSIS — Z4789 Encounter for other orthopedic aftercare: Secondary | ICD-10-CM | POA: Diagnosis not present

## 2020-10-07 DIAGNOSIS — W19XXXD Unspecified fall, subsequent encounter: Secondary | ICD-10-CM | POA: Diagnosis not present

## 2020-10-07 DIAGNOSIS — S72001D Fracture of unspecified part of neck of right femur, subsequent encounter for closed fracture with routine healing: Secondary | ICD-10-CM | POA: Diagnosis not present

## 2020-10-07 DIAGNOSIS — M5442 Lumbago with sciatica, left side: Secondary | ICD-10-CM | POA: Diagnosis not present

## 2020-10-10 DIAGNOSIS — S72001D Fracture of unspecified part of neck of right femur, subsequent encounter for closed fracture with routine healing: Secondary | ICD-10-CM | POA: Diagnosis not present

## 2020-10-10 DIAGNOSIS — R293 Abnormal posture: Secondary | ICD-10-CM | POA: Diagnosis not present

## 2020-10-10 DIAGNOSIS — W19XXXD Unspecified fall, subsequent encounter: Secondary | ICD-10-CM | POA: Diagnosis not present

## 2020-10-10 DIAGNOSIS — S72141D Displaced intertrochanteric fracture of right femur, subsequent encounter for closed fracture with routine healing: Secondary | ICD-10-CM | POA: Diagnosis not present

## 2020-10-10 DIAGNOSIS — Z4789 Encounter for other orthopedic aftercare: Secondary | ICD-10-CM | POA: Diagnosis not present

## 2020-10-10 DIAGNOSIS — M5442 Lumbago with sciatica, left side: Secondary | ICD-10-CM | POA: Diagnosis not present

## 2020-10-12 DIAGNOSIS — M5442 Lumbago with sciatica, left side: Secondary | ICD-10-CM | POA: Diagnosis not present

## 2020-10-12 DIAGNOSIS — R293 Abnormal posture: Secondary | ICD-10-CM | POA: Diagnosis not present

## 2020-10-12 DIAGNOSIS — S72141D Displaced intertrochanteric fracture of right femur, subsequent encounter for closed fracture with routine healing: Secondary | ICD-10-CM | POA: Diagnosis not present

## 2020-10-12 DIAGNOSIS — S72001D Fracture of unspecified part of neck of right femur, subsequent encounter for closed fracture with routine healing: Secondary | ICD-10-CM | POA: Diagnosis not present

## 2020-10-12 DIAGNOSIS — Z4789 Encounter for other orthopedic aftercare: Secondary | ICD-10-CM | POA: Diagnosis not present

## 2020-10-12 DIAGNOSIS — W19XXXD Unspecified fall, subsequent encounter: Secondary | ICD-10-CM | POA: Diagnosis not present

## 2020-10-14 DIAGNOSIS — M5442 Lumbago with sciatica, left side: Secondary | ICD-10-CM | POA: Diagnosis not present

## 2020-10-14 DIAGNOSIS — Z4789 Encounter for other orthopedic aftercare: Secondary | ICD-10-CM | POA: Diagnosis not present

## 2020-10-14 DIAGNOSIS — S72141D Displaced intertrochanteric fracture of right femur, subsequent encounter for closed fracture with routine healing: Secondary | ICD-10-CM | POA: Diagnosis not present

## 2020-10-14 DIAGNOSIS — S72001D Fracture of unspecified part of neck of right femur, subsequent encounter for closed fracture with routine healing: Secondary | ICD-10-CM | POA: Diagnosis not present

## 2020-10-14 DIAGNOSIS — R293 Abnormal posture: Secondary | ICD-10-CM | POA: Diagnosis not present

## 2020-10-14 DIAGNOSIS — W19XXXD Unspecified fall, subsequent encounter: Secondary | ICD-10-CM | POA: Diagnosis not present

## 2020-10-17 DIAGNOSIS — W19XXXD Unspecified fall, subsequent encounter: Secondary | ICD-10-CM | POA: Diagnosis not present

## 2020-10-17 DIAGNOSIS — S72001D Fracture of unspecified part of neck of right femur, subsequent encounter for closed fracture with routine healing: Secondary | ICD-10-CM | POA: Diagnosis not present

## 2020-10-17 DIAGNOSIS — R293 Abnormal posture: Secondary | ICD-10-CM | POA: Diagnosis not present

## 2020-10-17 DIAGNOSIS — Z4789 Encounter for other orthopedic aftercare: Secondary | ICD-10-CM | POA: Diagnosis not present

## 2020-10-17 DIAGNOSIS — S72141D Displaced intertrochanteric fracture of right femur, subsequent encounter for closed fracture with routine healing: Secondary | ICD-10-CM | POA: Diagnosis not present

## 2020-10-17 DIAGNOSIS — M5442 Lumbago with sciatica, left side: Secondary | ICD-10-CM | POA: Diagnosis not present

## 2020-10-19 DIAGNOSIS — R293 Abnormal posture: Secondary | ICD-10-CM | POA: Diagnosis not present

## 2020-10-19 DIAGNOSIS — W19XXXD Unspecified fall, subsequent encounter: Secondary | ICD-10-CM | POA: Diagnosis not present

## 2020-10-19 DIAGNOSIS — Z4789 Encounter for other orthopedic aftercare: Secondary | ICD-10-CM | POA: Diagnosis not present

## 2020-10-19 DIAGNOSIS — M5442 Lumbago with sciatica, left side: Secondary | ICD-10-CM | POA: Diagnosis not present

## 2020-10-19 DIAGNOSIS — S72141D Displaced intertrochanteric fracture of right femur, subsequent encounter for closed fracture with routine healing: Secondary | ICD-10-CM | POA: Diagnosis not present

## 2020-10-19 DIAGNOSIS — S72001D Fracture of unspecified part of neck of right femur, subsequent encounter for closed fracture with routine healing: Secondary | ICD-10-CM | POA: Diagnosis not present

## 2020-10-21 DIAGNOSIS — R293 Abnormal posture: Secondary | ICD-10-CM | POA: Diagnosis not present

## 2020-10-21 DIAGNOSIS — F0390 Unspecified dementia without behavioral disturbance: Secondary | ICD-10-CM | POA: Diagnosis not present

## 2020-10-21 DIAGNOSIS — Z4789 Encounter for other orthopedic aftercare: Secondary | ICD-10-CM | POA: Diagnosis not present

## 2020-10-21 DIAGNOSIS — W19XXXD Unspecified fall, subsequent encounter: Secondary | ICD-10-CM | POA: Diagnosis not present

## 2020-10-21 DIAGNOSIS — Z741 Need for assistance with personal care: Secondary | ICD-10-CM | POA: Diagnosis not present

## 2020-10-21 DIAGNOSIS — M545 Low back pain, unspecified: Secondary | ICD-10-CM | POA: Diagnosis not present

## 2020-10-21 DIAGNOSIS — R262 Difficulty in walking, not elsewhere classified: Secondary | ICD-10-CM | POA: Diagnosis not present

## 2020-10-21 DIAGNOSIS — S72001D Fracture of unspecified part of neck of right femur, subsequent encounter for closed fracture with routine healing: Secondary | ICD-10-CM | POA: Diagnosis not present

## 2020-10-21 DIAGNOSIS — M5442 Lumbago with sciatica, left side: Secondary | ICD-10-CM | POA: Diagnosis not present

## 2020-10-21 DIAGNOSIS — S72141D Displaced intertrochanteric fracture of right femur, subsequent encounter for closed fracture with routine healing: Secondary | ICD-10-CM | POA: Diagnosis not present

## 2020-10-21 DIAGNOSIS — R4189 Other symptoms and signs involving cognitive functions and awareness: Secondary | ICD-10-CM | POA: Diagnosis not present

## 2020-10-21 DIAGNOSIS — R278 Other lack of coordination: Secondary | ICD-10-CM | POA: Diagnosis not present

## 2020-10-21 DIAGNOSIS — M6281 Muscle weakness (generalized): Secondary | ICD-10-CM | POA: Diagnosis not present

## 2020-10-26 DIAGNOSIS — Z4789 Encounter for other orthopedic aftercare: Secondary | ICD-10-CM | POA: Diagnosis not present

## 2020-10-26 DIAGNOSIS — S72141D Displaced intertrochanteric fracture of right femur, subsequent encounter for closed fracture with routine healing: Secondary | ICD-10-CM | POA: Diagnosis not present

## 2020-10-26 DIAGNOSIS — M5442 Lumbago with sciatica, left side: Secondary | ICD-10-CM | POA: Diagnosis not present

## 2020-10-26 DIAGNOSIS — W19XXXD Unspecified fall, subsequent encounter: Secondary | ICD-10-CM | POA: Diagnosis not present

## 2020-10-26 DIAGNOSIS — S72001D Fracture of unspecified part of neck of right femur, subsequent encounter for closed fracture with routine healing: Secondary | ICD-10-CM | POA: Diagnosis not present

## 2020-10-26 DIAGNOSIS — R293 Abnormal posture: Secondary | ICD-10-CM | POA: Diagnosis not present

## 2020-10-28 DIAGNOSIS — M5442 Lumbago with sciatica, left side: Secondary | ICD-10-CM | POA: Diagnosis not present

## 2020-10-28 DIAGNOSIS — S72001D Fracture of unspecified part of neck of right femur, subsequent encounter for closed fracture with routine healing: Secondary | ICD-10-CM | POA: Diagnosis not present

## 2020-10-28 DIAGNOSIS — W19XXXD Unspecified fall, subsequent encounter: Secondary | ICD-10-CM | POA: Diagnosis not present

## 2020-10-28 DIAGNOSIS — S72141D Displaced intertrochanteric fracture of right femur, subsequent encounter for closed fracture with routine healing: Secondary | ICD-10-CM | POA: Diagnosis not present

## 2020-10-28 DIAGNOSIS — R293 Abnormal posture: Secondary | ICD-10-CM | POA: Diagnosis not present

## 2020-10-28 DIAGNOSIS — Z4789 Encounter for other orthopedic aftercare: Secondary | ICD-10-CM | POA: Diagnosis not present

## 2020-10-31 DIAGNOSIS — Z4789 Encounter for other orthopedic aftercare: Secondary | ICD-10-CM | POA: Diagnosis not present

## 2020-10-31 DIAGNOSIS — M5442 Lumbago with sciatica, left side: Secondary | ICD-10-CM | POA: Diagnosis not present

## 2020-10-31 DIAGNOSIS — R293 Abnormal posture: Secondary | ICD-10-CM | POA: Diagnosis not present

## 2020-10-31 DIAGNOSIS — W19XXXD Unspecified fall, subsequent encounter: Secondary | ICD-10-CM | POA: Diagnosis not present

## 2020-10-31 DIAGNOSIS — S72141D Displaced intertrochanteric fracture of right femur, subsequent encounter for closed fracture with routine healing: Secondary | ICD-10-CM | POA: Diagnosis not present

## 2020-10-31 DIAGNOSIS — S72001D Fracture of unspecified part of neck of right femur, subsequent encounter for closed fracture with routine healing: Secondary | ICD-10-CM | POA: Diagnosis not present

## 2020-11-02 DIAGNOSIS — Z4789 Encounter for other orthopedic aftercare: Secondary | ICD-10-CM | POA: Diagnosis not present

## 2020-11-02 DIAGNOSIS — S72141D Displaced intertrochanteric fracture of right femur, subsequent encounter for closed fracture with routine healing: Secondary | ICD-10-CM | POA: Diagnosis not present

## 2020-11-02 DIAGNOSIS — W19XXXD Unspecified fall, subsequent encounter: Secondary | ICD-10-CM | POA: Diagnosis not present

## 2020-11-02 DIAGNOSIS — M5442 Lumbago with sciatica, left side: Secondary | ICD-10-CM | POA: Diagnosis not present

## 2020-11-02 DIAGNOSIS — R293 Abnormal posture: Secondary | ICD-10-CM | POA: Diagnosis not present

## 2020-11-02 DIAGNOSIS — S72001D Fracture of unspecified part of neck of right femur, subsequent encounter for closed fracture with routine healing: Secondary | ICD-10-CM | POA: Diagnosis not present

## 2020-11-04 DIAGNOSIS — S72001D Fracture of unspecified part of neck of right femur, subsequent encounter for closed fracture with routine healing: Secondary | ICD-10-CM | POA: Diagnosis not present

## 2020-11-04 DIAGNOSIS — Z4789 Encounter for other orthopedic aftercare: Secondary | ICD-10-CM | POA: Diagnosis not present

## 2020-11-04 DIAGNOSIS — W19XXXD Unspecified fall, subsequent encounter: Secondary | ICD-10-CM | POA: Diagnosis not present

## 2020-11-04 DIAGNOSIS — S72141D Displaced intertrochanteric fracture of right femur, subsequent encounter for closed fracture with routine healing: Secondary | ICD-10-CM | POA: Diagnosis not present

## 2020-11-04 DIAGNOSIS — M5442 Lumbago with sciatica, left side: Secondary | ICD-10-CM | POA: Diagnosis not present

## 2020-11-04 DIAGNOSIS — R293 Abnormal posture: Secondary | ICD-10-CM | POA: Diagnosis not present

## 2020-11-07 DIAGNOSIS — Z4789 Encounter for other orthopedic aftercare: Secondary | ICD-10-CM | POA: Diagnosis not present

## 2020-11-07 DIAGNOSIS — W19XXXD Unspecified fall, subsequent encounter: Secondary | ICD-10-CM | POA: Diagnosis not present

## 2020-11-07 DIAGNOSIS — S72141D Displaced intertrochanteric fracture of right femur, subsequent encounter for closed fracture with routine healing: Secondary | ICD-10-CM | POA: Diagnosis not present

## 2020-11-07 DIAGNOSIS — S72001D Fracture of unspecified part of neck of right femur, subsequent encounter for closed fracture with routine healing: Secondary | ICD-10-CM | POA: Diagnosis not present

## 2020-11-07 DIAGNOSIS — M5442 Lumbago with sciatica, left side: Secondary | ICD-10-CM | POA: Diagnosis not present

## 2020-11-07 DIAGNOSIS — R293 Abnormal posture: Secondary | ICD-10-CM | POA: Diagnosis not present

## 2020-11-09 DIAGNOSIS — W19XXXD Unspecified fall, subsequent encounter: Secondary | ICD-10-CM | POA: Diagnosis not present

## 2020-11-09 DIAGNOSIS — S72141D Displaced intertrochanteric fracture of right femur, subsequent encounter for closed fracture with routine healing: Secondary | ICD-10-CM | POA: Diagnosis not present

## 2020-11-09 DIAGNOSIS — M5442 Lumbago with sciatica, left side: Secondary | ICD-10-CM | POA: Diagnosis not present

## 2020-11-09 DIAGNOSIS — Z4789 Encounter for other orthopedic aftercare: Secondary | ICD-10-CM | POA: Diagnosis not present

## 2020-11-09 DIAGNOSIS — R293 Abnormal posture: Secondary | ICD-10-CM | POA: Diagnosis not present

## 2020-11-09 DIAGNOSIS — S72001D Fracture of unspecified part of neck of right femur, subsequent encounter for closed fracture with routine healing: Secondary | ICD-10-CM | POA: Diagnosis not present

## 2020-11-10 DIAGNOSIS — Z23 Encounter for immunization: Secondary | ICD-10-CM | POA: Diagnosis not present

## 2020-11-11 DIAGNOSIS — W19XXXD Unspecified fall, subsequent encounter: Secondary | ICD-10-CM | POA: Diagnosis not present

## 2020-11-11 DIAGNOSIS — Z4789 Encounter for other orthopedic aftercare: Secondary | ICD-10-CM | POA: Diagnosis not present

## 2020-11-11 DIAGNOSIS — S72141D Displaced intertrochanteric fracture of right femur, subsequent encounter for closed fracture with routine healing: Secondary | ICD-10-CM | POA: Diagnosis not present

## 2020-11-11 DIAGNOSIS — S72001D Fracture of unspecified part of neck of right femur, subsequent encounter for closed fracture with routine healing: Secondary | ICD-10-CM | POA: Diagnosis not present

## 2020-11-11 DIAGNOSIS — M5442 Lumbago with sciatica, left side: Secondary | ICD-10-CM | POA: Diagnosis not present

## 2020-11-11 DIAGNOSIS — R293 Abnormal posture: Secondary | ICD-10-CM | POA: Diagnosis not present

## 2020-11-11 IMAGING — MR MRI HEAD WITHOUT CONTRAST
9 of 10 series · 42 of 48 positions shown · non-contrast
Comparison: Head CT 06/25/2018

CLINICAL DATA: Confusion for 2 weeks

EXAM:
MRI HEAD WITHOUT CONTRAST
TECHNIQUE: Multiplanar, multiecho pulse sequences of the brain and surrounding
structures were obtained without intravenous contrast.

[Series 2: ax dwi_tracew · axial · 3.0mm · 0.83mm/px · z∈[-43,+117]mm · 6 of 55 slices shown]
[im 1/55]
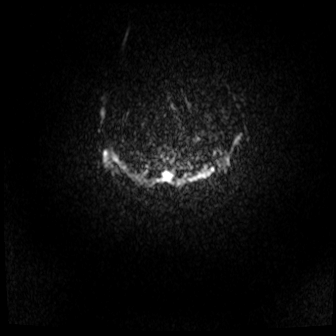
[im 11/55]
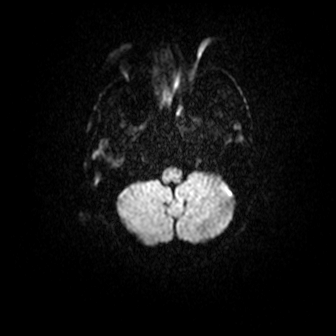
[im 22/55]
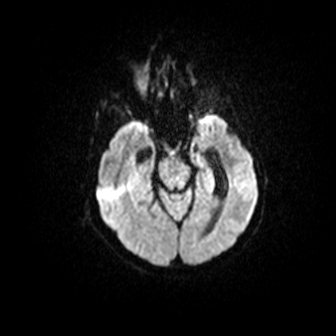
[im 33/55]
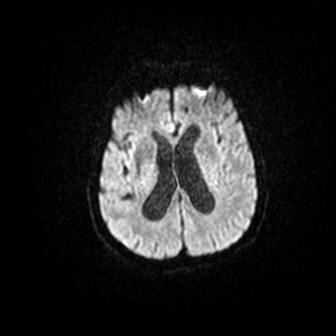
[im 44/55]
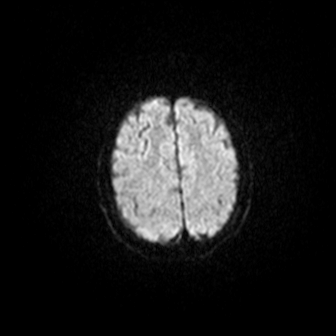
[im 55/55]
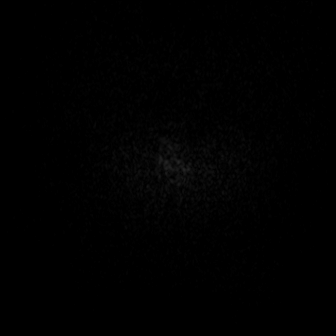

[Series 3: ax dwi_adc · axial · 3.0mm · 0.83mm/px · z∈[-43,+117]mm · 7 of 55 slices shown]
[im 1/55]
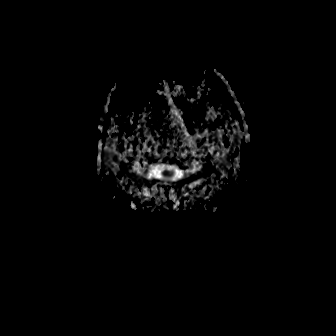
[im 10/55]
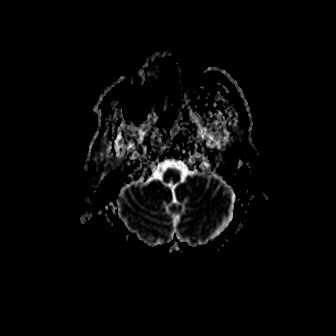
[im 19/55]
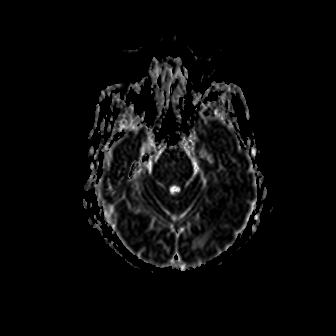
[im 28/55]
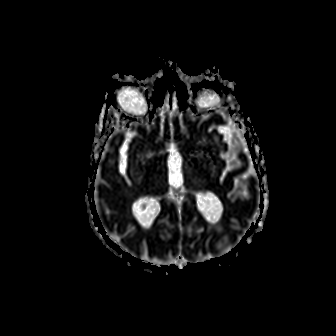
[im 37/55]
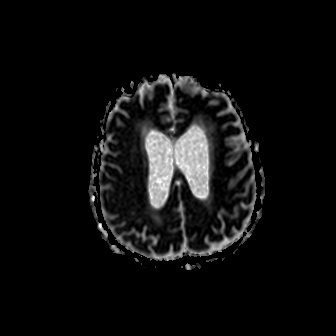
[im 46/55]
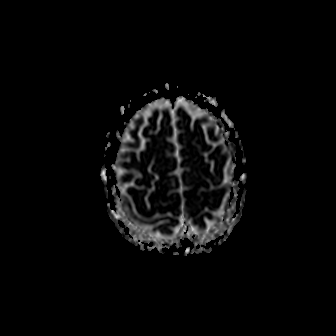
[im 55/55]
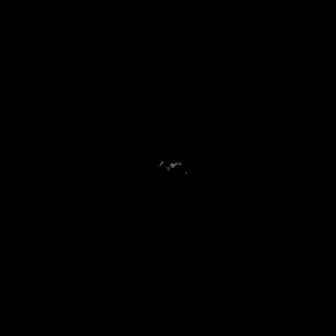

[Series 4: cor dwi_tracew · coronal · 5.0mm · 0.68mm/px · 6 of 45 slices shown]
[im 1/45]
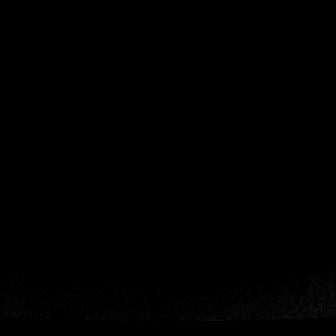
[im 9/45]
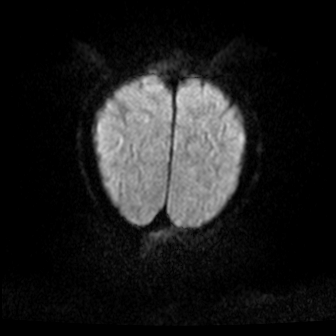
[im 18/45]
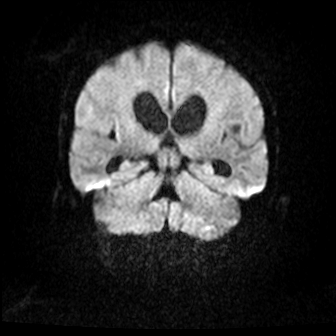
[im 27/45]
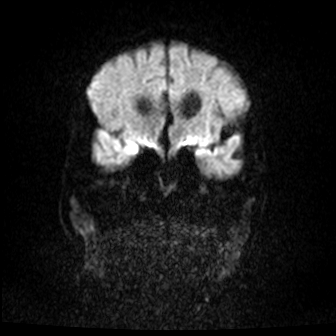
[im 36/45]
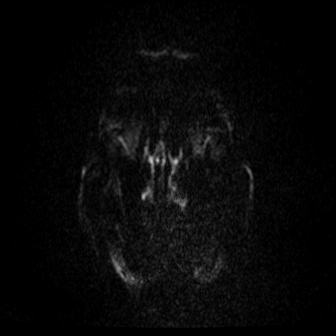
[im 45/45]
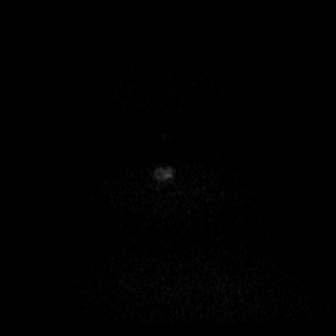

[Series 5: cor dwi_adc · coronal · 5.0mm · 0.68mm/px · 4 of 43 slices shown]
[im 1/43]
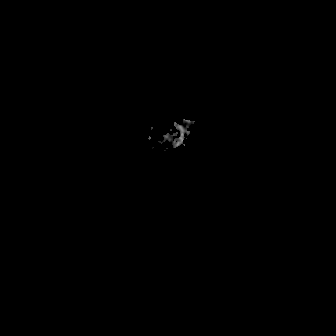
[im 9/43]
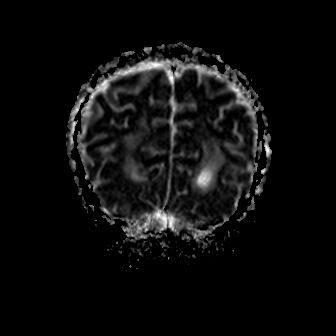
[im 17/43]
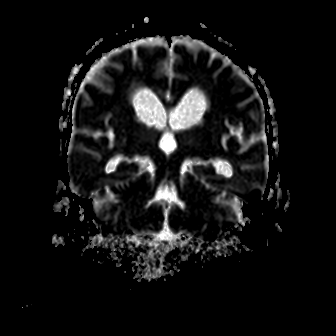
[im 26/43]
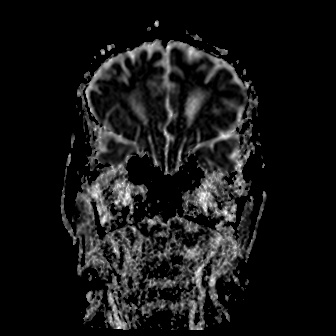

[Series 6: T1 · sagittal · 5.0mm · 0.94mm/px · 3 of 25 slices shown (1 of 2)]
[im 1/25]
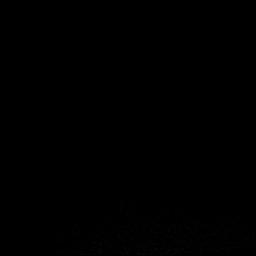
[im 13/25]
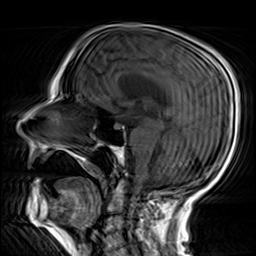
[im 25/25]
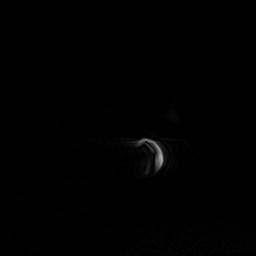

[Series 7: T2 · axial · 5.0mm · 0.45mm/px · z∈[-40,+112]mm · 4 of 27 slices shown (1 of 2)]
[im 1/27]
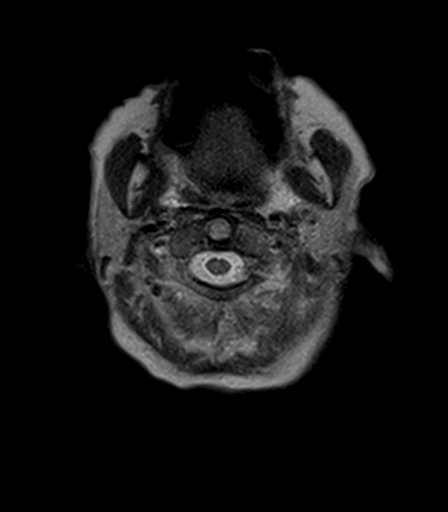
[im 9/27]
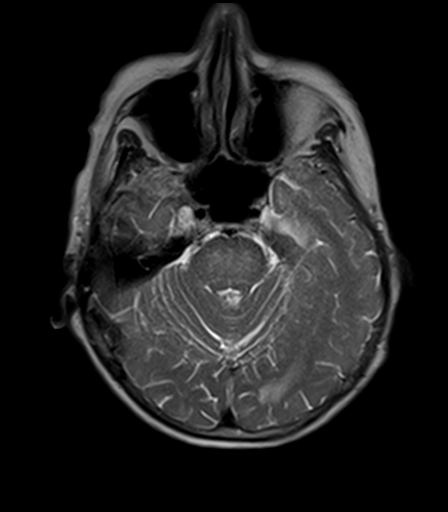
[im 18/27]
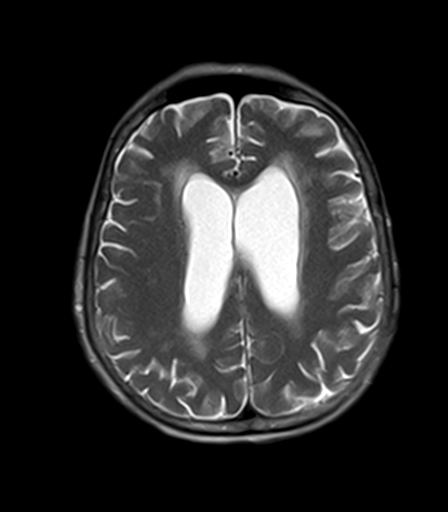
[im 27/27]
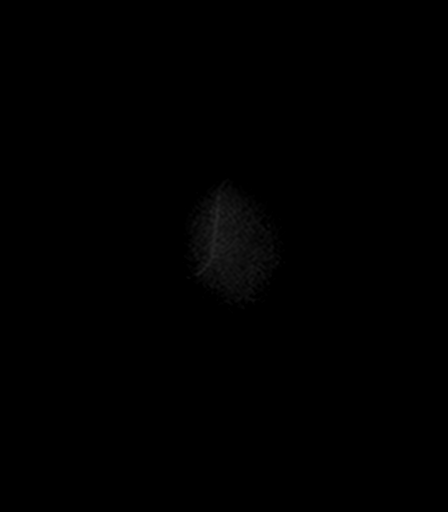

[Series 9: FLAIR · axial · 5.0mm · 1.20mm/px · z∈[-41,+112]mm · 4 of 27 slices shown]
[im 1/27]
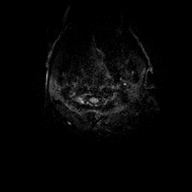
[im 9/27]
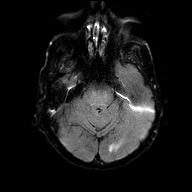
[im 18/27]
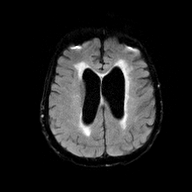
[im 27/27]
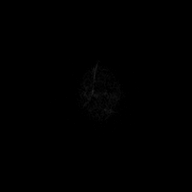

[Series 10: T1 · axial · 5.0mm · 0.90mm/px · z∈[-40,+112]mm · 4 of 27 slices shown (2 of 2)]
[im 1/27]
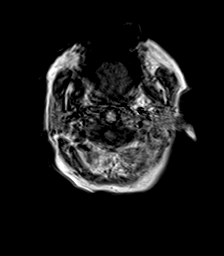
[im 9/27]
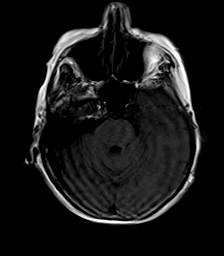
[im 18/27]
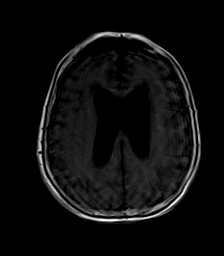
[im 27/27]
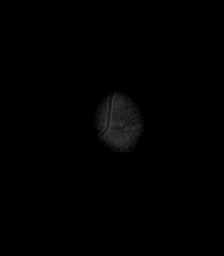

[Series 11: T2 · coronal · 5.0mm · 0.45mm/px · 4 of 31 slices shown (2 of 2)]
[im 1/31]
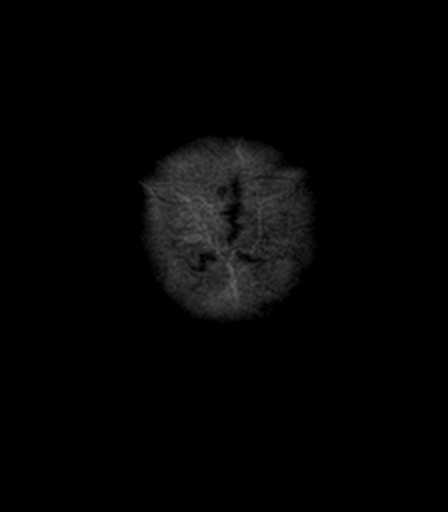
[im 11/31]
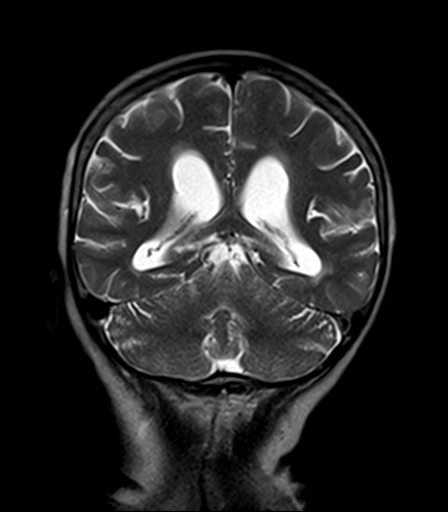
[im 21/31]
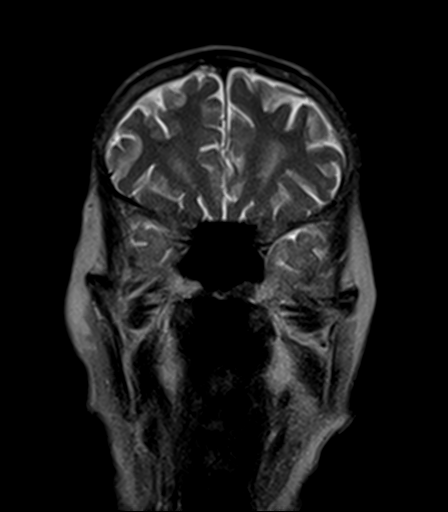
[im 31/31]
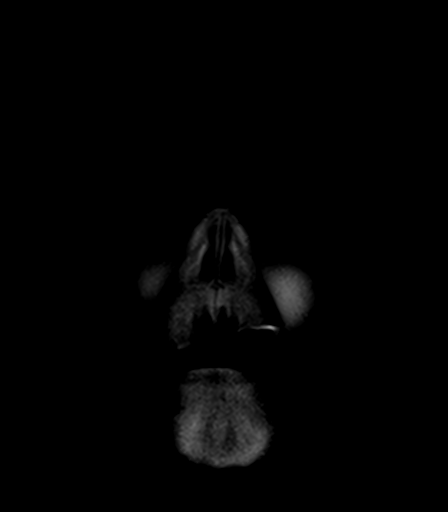

[42 of 48 positions shown; findings below may reference images not displayed]

FINDINGS: BRAIN: Single punctate focus of hyperintensity on axial
diffusion-weighted imaging within the dorsal pons, not confirmed on
the coronal DWI sequence. The midline structures are normal. No
midline shift or other mass effect. Early confluent hyperintense
T2-weighted signal of the periventricular and deep white matter,
most commonly due to chronic ischemic microangiopathy. Generalized
atrophy without lobar predilection. Susceptibility-sensitive
sequences show no chronic microhemorrhage or superficial siderosis.
No mass lesion.

VASCULAR: The major intracranial arterial and venous sinus flow
voids are normal.

SKULL AND UPPER CERVICAL SPINE: Calvarial bone marrow signal is
normal. There is no skull base mass. Visualized upper cervical spine
and soft tissues are normal.

SINUSES/ORBITS: No fluid levels or advanced mucosal thickening. No
mastoid or middle ear effusion. The orbits are normal.
IMPRESSION: 1. Chronic ischemic microangiopathy and generalized volume loss.
2. Punctate focus of hyperintensity on diffusion-weighted imaging
within the left dorsal pons is likely artifactual, as it is not
confirmed on coronal imaging. If this were a true focus of ischemia,
associated symptoms might include right-sided weakness or lateral
gaze palsy.

## 2020-11-14 DIAGNOSIS — R293 Abnormal posture: Secondary | ICD-10-CM | POA: Diagnosis not present

## 2020-11-14 DIAGNOSIS — S72141D Displaced intertrochanteric fracture of right femur, subsequent encounter for closed fracture with routine healing: Secondary | ICD-10-CM | POA: Diagnosis not present

## 2020-11-14 DIAGNOSIS — S72001D Fracture of unspecified part of neck of right femur, subsequent encounter for closed fracture with routine healing: Secondary | ICD-10-CM | POA: Diagnosis not present

## 2020-11-14 DIAGNOSIS — M5442 Lumbago with sciatica, left side: Secondary | ICD-10-CM | POA: Diagnosis not present

## 2020-11-14 DIAGNOSIS — Z4789 Encounter for other orthopedic aftercare: Secondary | ICD-10-CM | POA: Diagnosis not present

## 2020-11-14 DIAGNOSIS — W19XXXD Unspecified fall, subsequent encounter: Secondary | ICD-10-CM | POA: Diagnosis not present

## 2020-11-16 DIAGNOSIS — M5442 Lumbago with sciatica, left side: Secondary | ICD-10-CM | POA: Diagnosis not present

## 2020-11-16 DIAGNOSIS — S72141D Displaced intertrochanteric fracture of right femur, subsequent encounter for closed fracture with routine healing: Secondary | ICD-10-CM | POA: Diagnosis not present

## 2020-11-16 DIAGNOSIS — R293 Abnormal posture: Secondary | ICD-10-CM | POA: Diagnosis not present

## 2020-11-16 DIAGNOSIS — W19XXXD Unspecified fall, subsequent encounter: Secondary | ICD-10-CM | POA: Diagnosis not present

## 2020-11-16 DIAGNOSIS — Z4789 Encounter for other orthopedic aftercare: Secondary | ICD-10-CM | POA: Diagnosis not present

## 2020-11-16 DIAGNOSIS — S72001D Fracture of unspecified part of neck of right femur, subsequent encounter for closed fracture with routine healing: Secondary | ICD-10-CM | POA: Diagnosis not present

## 2020-11-25 ENCOUNTER — Encounter: Payer: Self-pay | Admitting: Emergency Medicine

## 2020-11-25 ENCOUNTER — Emergency Department: Payer: Medicare Other

## 2020-11-25 ENCOUNTER — Other Ambulatory Visit: Payer: Self-pay

## 2020-11-25 ENCOUNTER — Emergency Department
Admission: EM | Admit: 2020-11-25 | Discharge: 2020-11-25 | Disposition: A | Discharge status: Home / Self Care | Payer: Medicare Other | Attending: Emergency Medicine | Admitting: Emergency Medicine

## 2020-11-25 DIAGNOSIS — S32030A Wedge compression fracture of third lumbar vertebra, initial encounter for closed fracture: Secondary | ICD-10-CM | POA: Diagnosis not present

## 2020-11-25 DIAGNOSIS — Z96653 Presence of artificial knee joint, bilateral: Secondary | ICD-10-CM | POA: Diagnosis not present

## 2020-11-25 DIAGNOSIS — S22080A Wedge compression fracture of T11-T12 vertebra, initial encounter for closed fracture: Secondary | ICD-10-CM | POA: Diagnosis not present

## 2020-11-25 DIAGNOSIS — W010XXA Fall on same level from slipping, tripping and stumbling without subsequent striking against object, initial encounter: Secondary | ICD-10-CM | POA: Diagnosis not present

## 2020-11-25 DIAGNOSIS — Y9301 Activity, walking, marching and hiking: Secondary | ICD-10-CM | POA: Insufficient documentation

## 2020-11-25 DIAGNOSIS — S32038A Other fracture of third lumbar vertebra, initial encounter for closed fracture: Secondary | ICD-10-CM | POA: Insufficient documentation

## 2020-11-25 DIAGNOSIS — S3992XA Unspecified injury of lower back, initial encounter: Secondary | ICD-10-CM | POA: Diagnosis present

## 2020-11-25 DIAGNOSIS — R0789 Other chest pain: Secondary | ICD-10-CM | POA: Diagnosis not present

## 2020-11-25 DIAGNOSIS — M4056 Lordosis, unspecified, lumbar region: Secondary | ICD-10-CM | POA: Diagnosis not present

## 2020-11-25 DIAGNOSIS — S32040A Wedge compression fracture of fourth lumbar vertebra, initial encounter for closed fracture: Secondary | ICD-10-CM | POA: Diagnosis not present

## 2020-11-25 DIAGNOSIS — I517 Cardiomegaly: Secondary | ICD-10-CM | POA: Diagnosis not present

## 2020-11-25 DIAGNOSIS — I700 Atherosclerosis of aorta: Secondary | ICD-10-CM | POA: Diagnosis not present

## 2020-11-25 LAB — URINALYSIS, ROUTINE W REFLEX MICROSCOPIC
Bilirubin Urine: NEGATIVE
Glucose, UA: NEGATIVE mg/dL
Hgb urine dipstick: NEGATIVE
Ketones, ur: 5 mg/dL — AB
Leukocytes,Ua: NEGATIVE
Nitrite: NEGATIVE
Protein, ur: NEGATIVE mg/dL
Specific Gravity, Urine: 1.018 (ref 1.005–1.030)
pH: 6 (ref 5.0–8.0)

## 2020-11-25 MED ORDER — LIDOCAINE 5 % EX PTCH
1.0000 | MEDICATED_PATCH | CUTANEOUS | Status: DC
  Administered 2020-11-25: 1 via TRANSDERMAL
  Filled 2020-11-25: qty 1

## 2020-11-25 MED ORDER — TRAMADOL HCL 50 MG PO TABS
50.0000 mg | ORAL_TABLET | Freq: Once | ORAL | Status: AC
  Administered 2020-11-25: 50 mg via ORAL
  Filled 2020-11-25: qty 1

## 2020-11-25 MED ORDER — LIDOCAINE 5 % EX PTCH: 1.0000 | MEDICATED_PATCH | Freq: Two times a day (BID) | CUTANEOUS | 0 refills | Status: DC

## 2020-11-25 MED ORDER — TRAMADOL HCL 50 MG PO TABS: 50.0000 mg | ORAL_TABLET | Freq: Two times a day (BID) | ORAL | 0 refills | Status: DC | PRN

## 2020-11-25 NOTE — ED Triage Notes (Signed)
Pt to ED via POV, pt states that she had a fall on Monday night. Pt reports that she is having mid to lower back pain. Pt is in NAD.

## 2020-11-25 NOTE — ED Notes (Signed)
See triage note  presents s/p fall  family states she had gotten up to use the bathroom on Monday  fell  the fall was unwitnessed by family  pt is denying any neck pain or headache  pain is mainly to lower back and lower abd

## 2020-11-25 NOTE — ED Triage Notes (Signed)
First Nurse Note:  Arrives with C/O falling on Monday.  C/O low back pain.

## 2020-11-25 NOTE — ED Provider Notes (Signed)
[facility] Emergency Department Provider Note   ____________________________________________   None    (approximate)  I have reviewed the triage vital signs and the nursing notes.   HISTORY  Chief Complaint Fall    HPI Melissa [name] is a 87 y.o. female patient complain of mid and low back pain.  Patient also complain of low anterior chest wall pain.  Complaint is secondary to a fall which occurred 4 days ago.  Patient was ambulating for walker when she tripped and fell.  This was unwitnessed fall according to husband.  Patient states patient complain of pain has increased in the last 2 days.  Denies dyspnea.         Past Medical History:  Diagnosis Date   Arthritis    Chest pain    a. 05/2015 - post-prandial.   GERD (gastroesophageal reflux disease)    a. takes Omeprazole daily, prev seen by D. Brodie.   Heart murmur    a. 05/2013 Echo: EF 55-60%, gr2 DD, mild AI/MR.   History of blood transfusion    no abnormal reaction noted    History of migraine headaches    a. last 10/2013   Iron deficiency anemia    Joint pain    PONV (postoperative nausea and vomiting)    Rectocele 2011    Patient Active Problem List   Diagnosis Date Noted   Closed intertrochanteric fracture of hip, right, initial encounter ([place]) 07/14/2020   Hardening of the aorta (main artery of the heart) ([place]) 07/14/2020   Accidental fall 07/14/2020   Compression fx, lumbar spine ([place]) 04/11/2019   Intractable low back pain 04/09/2019   Compression fracture of lumbosacral vertebra, nontraumatic, initial encounter with retropulsion 04/09/2019   Sacral insufficiency fracture 04/09/2019   AI (aortic incompetence) 12/30/2017   OP (osteoporosis) 12/30/2017   S/P shoulder replacement 11/26/2013   ANEMIA, IRON DEFICIENCY 04/05/2009   HEMORRHOIDS, INTERNAL 04/05/2009   GERD 04/05/2009   CONSTIPATION 04/05/2009   ARTHRITIS 04/05/2009    Past Surgical History:  Procedure  Laterality Date   cataract surgery  Bilateral    R 03/2001; L 05/2001.   COLONOSCOPY     HYSTEROSCOPY  11/1999   w/ resection of endometrial polyp by uterine curetting.   INTRAMEDULLARY (IM) NAIL INTERTROCHANTERIC Right 07/15/2020   Procedure: INTRAMEDULLARY (IM) NAIL INTERTROCHANTRIC;  Surgeon: Thornton Park, MD;  Location: [facility] ORS;  Service: Orthopedics;  Laterality: Right;   JOINT REPLACEMENT Bilateral R-9/04, L- 8/05   Total Knee Arthroplasties.   KYPHOPLASTY N/A 04/10/2019   Procedure: KYPHOPLASTY;  Surgeon: Hessie Knows, MD;  Location: [facility] ORS;  Service: Orthopedics;  Laterality: N/A;   REVERSE SHOULDER ARTHROPLASTY Right 11/26/2013   Procedure: RIGHT REVERSE SHOULDER ARTHROPLASTY;  Surgeon: Marin Shutter, MD;  Location: [place];  Service: Orthopedics;  Laterality: Right;   SHOULDER ARTHROSCOPY Right    THYROIDECTOMY  at age 43   TONSILLECTOMY      Prior to Admission medications   Medication Sig Start Date End Date Taking? Authorizing Provider  lidocaine (LIDODERM) 5 % Place 1 patch onto the skin every 12 (twelve) hours. Remove & Discard patch within 12 hours or as directed by MD 11/25/20 11/25/21 Yes Sable Feil, PA-C  traMADol (ULTRAM) 50 MG tablet Take 1 tablet (50 mg total) by mouth every 12 (twelve) hours as needed. 11/25/20  Yes Sable Feil, PA-C  cholecalciferol (VITAMIN D) 1000 units tablet Take 1,000 Units by mouth daily.    [provider]  enoxaparin (LOVENOX) 40 MG/0.4ML injection Inject 0.4 mLs (40 mg total) into the skin daily for 14 days. 07/20/20 08/03/20  Jennye Boroughs, MD  ferrous sulfate 325 (65 FE) MG tablet Take 1 tablet (325 mg total) by mouth every other day. 07/19/20 09/17/20  Jennye Boroughs, MD  saccharomyces boulardii (FLORASTOR) 250 MG capsule Take 250 mg by mouth daily.    [provider]  traMADol (ULTRAM) 50 MG tablet Take 1 tablet (50 mg total) by mouth every 6 (six) hours as needed. 07/19/20   Jennye Boroughs, MD  trolamine salicylate  (ASPERCREME) 10 % cream Apply 1 application topically as needed for muscle pain.    [provider]    Allergies Sulfonamide derivatives  Family History  Problem Relation Age of Onset   Heart attack Mother        died @ 84   Heart attack Brother        died @ 38   Heart attack Brother        died @ 76   Other Father        died suddenly @ 66.    Social History Social History   Tobacco Use   Smoking status: Never   Smokeless tobacco: Never  Substance Use Topics   Alcohol use: No   Drug use: No    Review of Systems  Constitutional: No fever/chills Eyes: No visual changes. ENT: No sore throat. Cardiovascular: Denies chest pain. Respiratory: Denies shortness of breath. Gastrointestinal: No abdominal pain.  No nausea, no vomiting.  No diarrhea.  No constipation. Genitourinary: Negative for dysuria. Musculoskeletal: Positive for back pain. Skin: Negative for rash. Neurological: Negative for headaches, focal weakness or numbness. Allergic/Immunilogical: Sulfa ____________________________________________   PHYSICAL EXAM:  VITAL SIGNS: ED Triage Vitals  Enc Vitals Group     BP 11/25/20 0915 131/65     Pulse Rate 11/25/20 0915 81     Resp 11/25/20 0915 18     Temp 11/25/20 0915 98.1 F (36.7 C)     Temp Source 11/25/20 0915 Oral     SpO2 11/25/20 0915 91 %     Weight 11/25/20 0915 110 lb (49.9 kg)     Height 11/25/20 0915 5\' 4"  (1.626 m)     Head Circumference --      Peak Flow --      Pain Score 11/25/20 0929 9     Pain Loc --      Pain Edu? --      Excl. in [place]? --    Constitutional: Alert and oriented. Well appearing and in no acute distress. Eyes: Conjunctivae are normal. PERRL. EOMI. Head: Atraumatic. Nose: No congestion/rhinnorhea. Mouth/Throat: Mucous membranes are moist.  Oropharynx non-erythematous. Neck: No stridor.  No cervical spine tenderness to palpation. Hematological/Lymphatic/Immunilogical: No cervical  lymphadenopathy. Cardiovascular: Normal rate, regular rhythm. Grossly normal heart sounds.  Good peripheral circulation. Respiratory: Normal respiratory effort.  No retractions. Lungs CTAB. Gastrointestinal: Soft and nontender. No distention. No abdominal bruits. No CVA tenderness. Genitourinary: Deferred Musculoskeletal: No obvious deformity.  Patient is moderate guarding from L3-S1.   Neurologic:  Normal speech and language. No gross focal neurologic deficits are appreciated. No gait instability. Skin:  Skin is warm, dry and intact. No rash noted.  No abrasion or ecchymosis. Psychiatric: Mood and affect are normal. Speech and behavior are normal.  ____________________________________________   LABS (all labs ordered are listed, but only abnormal results are displayed)  Labs Reviewed  URINALYSIS, ROUTINE W REFLEX MICROSCOPIC -  Abnormal; Notable for the following components:      Result Value   Color, Urine YELLOW (*)    APPearance CLEAR (*)    Ketones, ur 5 (*)    All other components within normal limits   ____________________________________________  EKG   ____________________________________________  RADIOLOGY I, Sable Feil, personally viewed and evaluated these images (plain radiographs) as part of my medical decision making, as well as reviewing the written report by the radiologist.  ED MD interpretation: Multiple compression fractures of the lumbar spine.  Official radiology report(s): DG Chest 2 View  Result Date: 11/25/2020 CLINICAL DATA:  87 year old female status post fall earlier this week. EXAM: CHEST - 2 VIEW COMPARISON:  Chest radiographs 07/14/2020 and earlier. FINDINGS: Lower lung volumes. Stable mild cardiomegaly and mediastinal contours. Visualized tracheal air column is within normal limits. Chronic right shoulder arthroplasty. Chronic midthoracic compression fracture appears stable since May., but the adjacent superior segment is mildly compressed and  may be new. Chronic augmentation of an upper lumbar vertebral body. No pneumothorax, pulmonary edema, pleural effusion or confluent pulmonary opacity. Abdominal Calcified aortic atherosclerosis. Negative visible bowel gas pattern. IMPRESSION: 1.  No acute cardiopulmonary abnormality. 2. Chronic severe midthoracic compression fracture, but age indeterminate compression of the adjacent vertebral segment - seems new since May. If specific therapy such as vertebroplasty is desired, Thoracic MRI or Nuclear Medicine Whole-body Bone Scan would best determine acuity. 3. Chronic L1 compression and augmentation. Chronic right shoulder arthroplasty. 4.  Aortic Atherosclerosis (ICD10-I70.0). Electronically Signed   By: Genevie Ann M.D.   On: 11/25/2020 11:02   DG Lumbar Spine 2-3 Views  Result Date: 11/25/2020 CLINICAL DATA:  87 year old female status post fall earlier this week. Previous L1 compression and augmentation. EXAM: LUMBAR SPINE - 2-3 VIEW COMPARISON:  Lumbar spine CT 04/09/2019. FINDINGS: On the lateral view chronic compression of T8 is included and severe (such that T7 is the age indeterminate level on chest radiographs today, please see that report). Chronic T11 and L1 compression fractures, the latter augmented. Chronic L4 compression fracture. New mild compression of the L3 superior endplate, and moderate compression of L5 since the CT last year. L2 remains intact. Lumbar lordosis has not significantly changed. Interval bilateral sacroplasty. Calcified aortic atherosclerosis. Negative visible bowel gas pattern. IMPRESSION: 1. Age indeterminate mild L3 and moderate L5 compression fractures, both new since last year. If specific therapy such as vertebroplasty is desired, Lumbar MRI or Nuclear Medicine Whole-body Bone Scan would best determine acuity. 2. Chronic T8, T11, L1, and L4 compression fractures. 3. Previous sacroplasty. Electronically Signed   By: Genevie Ann M.D.   On: 11/25/2020 11:05     ____________________________________________   PROCEDURES  Procedure(s) performed (including Critical Care):  Procedures   ____________________________________________   INITIAL IMPRESSION / ASSESSMENT AND PLAN / ED COURSE  As part of my medical decision making, I reviewed the following data within the [place]         Patient presents with low back pain secondary to a fall which occurred 4 days ago.  Discussed x-ray findings with patient and husband showing new compression fractures since last CT scan in February 2021.  Patient is still able to ambulate with assistance of walker.  Remarkable pain control with Lidoderm patch and tramadol.  Advised to follow-up with orthopedic doctor listed in his discharge care instructions. ____________________________________________   FINAL CLINICAL IMPRESSION(S) / ED DIAGNOSES  Final diagnoses:  Compression fracture of L3 vertebra, initial encounter ([place])  ED Discharge Orders          Ordered    traMADol (ULTRAM) 50 MG tablet  Every 12 hours PRN        11/25/20 1325    lidocaine (LIDODERM) 5 %  Every 12 hours        11/25/20 1325             Note:  This document was prepared using Dragon voice recognition software and may include unintentional dictation errors.    Sable Feil, PA-C 11/25/20 1332    Nena Polio, MD 11/25/20 [phone]

## 2020-11-25 NOTE — Discharge Instructions (Addendum)
Read and follow discharge care instruction.  Take medication as directed follow-up with orthopedic doctor listed in your discharge care instructions.

## 2021-06-21 ENCOUNTER — Emergency Department: Payer: Medicare Other

## 2021-06-21 DIAGNOSIS — R079 Chest pain, unspecified: Secondary | ICD-10-CM | POA: Diagnosis not present

## 2021-06-21 DIAGNOSIS — N390 Urinary tract infection, site not specified: Secondary | ICD-10-CM | POA: Diagnosis not present

## 2021-06-21 DIAGNOSIS — F0390 Unspecified dementia without behavioral disturbance: Secondary | ICD-10-CM | POA: Insufficient documentation

## 2021-06-21 DIAGNOSIS — G629 Polyneuropathy, unspecified: Secondary | ICD-10-CM | POA: Diagnosis not present

## 2021-06-21 DIAGNOSIS — M47812 Spondylosis without myelopathy or radiculopathy, cervical region: Secondary | ICD-10-CM | POA: Diagnosis not present

## 2021-06-21 DIAGNOSIS — Z96611 Presence of right artificial shoulder joint: Secondary | ICD-10-CM | POA: Insufficient documentation

## 2021-06-21 DIAGNOSIS — R531 Weakness: Secondary | ICD-10-CM | POA: Diagnosis not present

## 2021-06-21 DIAGNOSIS — I959 Hypotension, unspecified: Secondary | ICD-10-CM | POA: Diagnosis not present

## 2021-06-21 DIAGNOSIS — W19XXXA Unspecified fall, initial encounter: Secondary | ICD-10-CM | POA: Diagnosis not present

## 2021-06-21 DIAGNOSIS — S0990XA Unspecified injury of head, initial encounter: Secondary | ICD-10-CM | POA: Diagnosis not present

## 2021-06-21 DIAGNOSIS — Y92009 Unspecified place in unspecified non-institutional (private) residence as the place of occurrence of the external cause: Secondary | ICD-10-CM | POA: Diagnosis not present

## 2021-06-21 DIAGNOSIS — M25551 Pain in right hip: Secondary | ICD-10-CM | POA: Diagnosis not present

## 2021-06-21 DIAGNOSIS — R2681 Unsteadiness on feet: Secondary | ICD-10-CM | POA: Diagnosis present

## 2021-06-21 DIAGNOSIS — S8991XA Unspecified injury of right lower leg, initial encounter: Secondary | ICD-10-CM | POA: Diagnosis not present

## 2021-06-21 DIAGNOSIS — Z043 Encounter for examination and observation following other accident: Secondary | ICD-10-CM | POA: Diagnosis not present

## 2021-06-21 DIAGNOSIS — Z96653 Presence of artificial knee joint, bilateral: Secondary | ICD-10-CM | POA: Diagnosis not present

## 2021-06-21 LAB — BASIC METABOLIC PANEL
Anion gap: 8 (ref 5–15)
BUN: 28 mg/dL — ABNORMAL HIGH (ref 8–23)
CO2: 25 mmol/L (ref 22–32)
Calcium: 8.5 mg/dL — ABNORMAL LOW (ref 8.9–10.3)
Chloride: 105 mmol/L (ref 98–111)
Creatinine, Ser: 0.89 mg/dL (ref 0.44–1.00)
GFR, Estimated: >60 mL/min (ref >60)
Glucose, Bld: 122 mg/dL — ABNORMAL HIGH (ref 70–99)
Potassium: 3.9 mmol/L (ref 3.5–5.1)
Sodium: 138 mmol/L (ref 135–145)

## 2021-06-21 LAB — CBC
HCT: 29.7 % — ABNORMAL LOW (ref 36.0–46.0)
Hemoglobin: 9.6 g/dL — ABNORMAL LOW (ref 12.0–15.0)
MCH: 32.2 pg (ref 26.0–34.0)
MCHC: 32.3 g/dL (ref 30.0–36.0)
MCV: 99.7 fL (ref 80.0–100.0)
Platelets: 344 10*3/uL (ref 150–400)
RBC: 2.98 MIL/uL — ABNORMAL LOW (ref 3.87–5.11)
RDW: 18.6 % — ABNORMAL HIGH (ref 11.5–15.5)
WBC: 15.4 10*3/uL — ABNORMAL HIGH (ref 4.0–10.5)
nRBC: 0.1 % (ref 0.0–0.2)

## 2021-06-21 NOTE — ED Triage Notes (Signed)
First Nurse Note:  Arrives via [facility] from [facility] independent living.  Per report, patient fell 2 nights ago, but c/o right leg, not "holding her weight".  No obvious injury or pain.  No medical history other than left knee and right hip replacements in early 2000.  VS wnl. EMS states patient stood with weight on left and pivoted onto stretcher. ?

## 2021-06-21 NOTE — ED Triage Notes (Addendum)
Pt fell two days ago and reports pain to her right knee and shin area. Doesn't remember what she was doing when she fell. Not on thinners per pt.  ?

## 2021-06-21 NOTE — ED Notes (Signed)
Pt denies having to urinate at this time. Pt stated, "I have already been a few times while waiting."  ?

## 2021-06-22 ENCOUNTER — Other Ambulatory Visit: Payer: Self-pay

## 2021-06-22 ENCOUNTER — Observation Stay
Admission: EM | Admit: 2021-06-22 | Discharge: 2021-06-23 | Disposition: A | Discharge status: Home Health | Payer: Medicare Other | Attending: Internal Medicine | Admitting: Internal Medicine

## 2021-06-22 ENCOUNTER — Emergency Department: Payer: Medicare Other

## 2021-06-22 DIAGNOSIS — G629 Polyneuropathy, unspecified: Secondary | ICD-10-CM

## 2021-06-22 DIAGNOSIS — Y92009 Unspecified place in unspecified non-institutional (private) residence as the place of occurrence of the external cause: Secondary | ICD-10-CM

## 2021-06-22 DIAGNOSIS — M25551 Pain in right hip: Secondary | ICD-10-CM | POA: Diagnosis not present

## 2021-06-22 DIAGNOSIS — W19XXXA Unspecified fall, initial encounter: Secondary | ICD-10-CM

## 2021-06-22 DIAGNOSIS — N390 Urinary tract infection, site not specified: Secondary | ICD-10-CM | POA: Diagnosis not present

## 2021-06-22 DIAGNOSIS — K219 Gastro-esophageal reflux disease without esophagitis: Secondary | ICD-10-CM | POA: Diagnosis not present

## 2021-06-22 DIAGNOSIS — N3000 Acute cystitis without hematuria: Secondary | ICD-10-CM

## 2021-06-22 DIAGNOSIS — R531 Weakness: Secondary | ICD-10-CM

## 2021-06-22 DIAGNOSIS — R079 Chest pain, unspecified: Secondary | ICD-10-CM | POA: Diagnosis not present

## 2021-06-22 LAB — URINALYSIS, ROUTINE W REFLEX MICROSCOPIC
Bilirubin Urine: NEGATIVE
Glucose, UA: NEGATIVE mg/dL
Hgb urine dipstick: NEGATIVE
Ketones, ur: 5 mg/dL — AB
Nitrite: NEGATIVE
Protein, ur: 100 mg/dL — AB
Specific Gravity, Urine: 1.016 (ref 1.005–1.030)
Squamous Epithelial / HPF: NONE SEEN (ref 0–5)
WBC, UA: >50 WBC/hpf — ABNORMAL HIGH (ref 0–5)
pH: 5 (ref 5.0–8.0)

## 2021-06-22 LAB — BASIC METABOLIC PANEL
Anion gap: 10 (ref 5–15)
BUN: 24 mg/dL — ABNORMAL HIGH (ref 8–23)
CO2: 21 mmol/L — ABNORMAL LOW (ref 22–32)
Calcium: 8 mg/dL — ABNORMAL LOW (ref 8.9–10.3)
Chloride: 108 mmol/L (ref 98–111)
Creatinine, Ser: 0.71 mg/dL (ref 0.44–1.00)
GFR, Estimated: >60 mL/min (ref >60)
Glucose, Bld: 107 mg/dL — ABNORMAL HIGH (ref 70–99)
Potassium: 3.6 mmol/L (ref 3.5–5.1)
Sodium: 139 mmol/L (ref 135–145)

## 2021-06-22 LAB — CBC
HCT: 27.5 % — ABNORMAL LOW (ref 36.0–46.0)
Hemoglobin: 8.9 g/dL — ABNORMAL LOW (ref 12.0–15.0)
MCH: 33 pg (ref 26.0–34.0)
MCHC: 32.4 g/dL (ref 30.0–36.0)
MCV: 101.9 fL — ABNORMAL HIGH (ref 80.0–100.0)
Platelets: 291 10*3/uL (ref 150–400)
RBC: 2.7 MIL/uL — ABNORMAL LOW (ref 3.87–5.11)
RDW: 18.9 % — ABNORMAL HIGH (ref 11.5–15.5)
WBC: 15.2 10*3/uL — ABNORMAL HIGH (ref 4.0–10.5)
nRBC: 0 % (ref 0.0–0.2)

## 2021-06-22 LAB — LACTIC ACID, PLASMA
Lactic Acid, Venous: 1.1 mmol/L (ref 0.5–1.9)
Lactic Acid, Venous: 1.4 mmol/L (ref 0.5–1.9)

## 2021-06-22 MED ORDER — FERROUS SULFATE 325 (65 FE) MG PO TABS
325.0000 mg | ORAL_TABLET | ORAL | Status: DC
  Administered 2021-06-22: 325 mg via ORAL
  Filled 2021-06-22: qty 1

## 2021-06-22 MED ORDER — SODIUM CHLORIDE 0.9 % IV SOLN: 1.0000 g | Freq: Once | INTRAVENOUS | Status: DC

## 2021-06-22 MED ORDER — ONDANSETRON HCL 4 MG/2ML IJ SOLN
4.0000 mg | Freq: Four times a day (QID) | INTRAMUSCULAR | Status: DC | PRN
Start: 2021-06-22 — End: 2021-06-23

## 2021-06-22 MED ORDER — HALOPERIDOL LACTATE 5 MG/ML IJ SOLN
1.0000 mg | Freq: Four times a day (QID) | INTRAMUSCULAR | Status: DC | PRN
Start: 2021-06-22 — End: 2021-06-23

## 2021-06-22 MED ORDER — TRAMADOL HCL 50 MG PO TABS: 50.0000 mg | ORAL_TABLET | Freq: Four times a day (QID) | ORAL | Status: DC | PRN

## 2021-06-22 MED ORDER — TROLAMINE SALICYLATE 10 % EX CREA: 1.0000 "application " | TOPICAL_CREAM | CUTANEOUS | Status: DC | PRN

## 2021-06-22 MED ORDER — ENOXAPARIN SODIUM 40 MG/0.4ML IJ SOSY: 40.0000 mg | PREFILLED_SYRINGE | INTRAMUSCULAR | Status: DC

## 2021-06-22 MED ORDER — ONDANSETRON HCL 4 MG PO TABS: 4.0000 mg | ORAL_TABLET | Freq: Four times a day (QID) | ORAL | Status: DC | PRN

## 2021-06-22 MED ORDER — ACETAMINOPHEN 650 MG RE SUPP: 650.0000 mg | Freq: Four times a day (QID) | RECTAL | Status: DC | PRN

## 2021-06-22 MED ORDER — TRAZODONE HCL 50 MG PO TABS: 25.0000 mg | ORAL_TABLET | Freq: Every evening | ORAL | Status: DC | PRN

## 2021-06-22 MED ORDER — VITAMIN D 25 MCG (1000 UNIT) PO TABS
1000.0000 [IU] | ORAL_TABLET | Freq: Every day | ORAL | Status: DC
  Administered 2021-06-22 – 2021-06-23 (×2): 1000 [IU] via ORAL
  Filled 2021-06-22 (×2): qty 1

## 2021-06-22 MED ORDER — SACCHAROMYCES BOULARDII 250 MG PO CAPS
250.0000 mg | ORAL_CAPSULE | Freq: Every day | ORAL | Status: DC
  Administered 2021-06-22 – 2021-06-23 (×2): 250 mg via ORAL
  Filled 2021-06-22 (×2): qty 1

## 2021-06-22 MED ORDER — CEPHALEXIN 500 MG PO CAPS: 500.0000 mg | ORAL_CAPSULE | Freq: Once | ORAL | Status: DC

## 2021-06-22 MED ORDER — HALOPERIDOL 0.5 MG PO TABS
1.0000 mg | ORAL_TABLET | Freq: Four times a day (QID) | ORAL | Status: DC | PRN
  Administered 2021-06-22 – 2021-06-23 (×2): 1 mg via ORAL
  Filled 2021-06-22 (×2): qty 2

## 2021-06-22 MED ORDER — LIDOCAINE 5 % EX PTCH
1.0000 | MEDICATED_PATCH | Freq: Two times a day (BID) | CUTANEOUS | Status: DC | PRN
  Filled 2021-06-22: qty 1

## 2021-06-22 MED ORDER — SODIUM CHLORIDE 0.9 % IV SOLN
1.0000 g | Freq: Every day | INTRAVENOUS | Status: DC
  Administered 2021-06-22 – 2021-06-23 (×2): 1 g via INTRAVENOUS
  Filled 2021-06-22: qty 10
  Filled 2021-06-22: qty 1

## 2021-06-22 MED ORDER — SODIUM CHLORIDE 0.9 % IV SOLN: INTRAVENOUS | Status: AC

## 2021-06-22 MED ORDER — ACETAMINOPHEN 325 MG PO TABS
650.0000 mg | ORAL_TABLET | Freq: Four times a day (QID) | ORAL | Status: DC | PRN
  Administered 2021-06-22: 650 mg via ORAL
  Filled 2021-06-22: qty 2

## 2021-06-22 NOTE — Progress Notes (Addendum)
Brief hospitalist update note.  This is a nonbillable note.  Please see scanned H&P for full billable details. ? ?Briefly, this is a 88 year old Caucasian female with history significant for GERD, cognitive decline who presents to the ED after a accidental mechanical fall.  No evidence of syncopal event.  No chest pain or palpitations.  No evidence of seizure activity.  No loss of bowel or bladder continence or evidence of tongue biting. ? ?Initial work-up significant for urinary tract infection.  Also a CT head done that demonstrated some concern for normal pressure hydrocephalus.  Lumbar puncture was discussed between EDP and patient's husband.  At this time deferring lumbar tap due to patient's advanced age. ? ?Will treat for uncomplicated cystitis with IV antibiotics and intravenous fluids.  Therapy evaluations requested.  Anticipate discharge in 24 hours. ? ?Ralene Muskrat MD ? ?Called and updated patient's spouse Evany Schecter [phone] on 5/4 ?

## 2021-06-22 NOTE — [place] FL2 (Signed)
?[place] MEDICAID FL2 LEVEL OF CARE SCREENING TOOL  ?  ? ?IDENTIFICATION  ?Patient Name: ?Melissa [name] Birthdate: 08-17-1928 Sex: female Admission Date (Current Location): ?06/22/2021  ?[place] and [place] Number: ? [place] ?  Facility and Address:  ?[facility], [address] [place], [place] [address] ?     Provider Number: ?[id number]  ?Attending Physician Name and Address:  ?Sidney Ace, MD ? Relative Name and Phone Number:  ?Oboyle,[name] (Spouse)   [phone] (Home Phone) ?   ?Current Level of Care: ?Hospital Recommended Level of Care: ?[place] Prior Approval Number: ?  ? ?Date Approved/Denied: ?  PASRR Number: ?[id number] A ? ?Discharge Plan: ?SNF ?  ? ?Current Diagnoses: ?Patient Active Problem List  ? Diagnosis Date Noted  ? UTI (urinary tract infection) 06/22/2021  ? Fall at home, initial encounter 06/22/2021  ? GERD without esophagitis 06/22/2021  ? Peripheral neuropathy 06/22/2021  ? Closed intertrochanteric fracture of hip, right, initial encounter ([place]) 07/14/2020  ? Hardening of the aorta (main artery of the heart) ([place]) 07/14/2020  ? Accidental fall 07/14/2020  ? Compression fx, lumbar spine ([place]) 04/11/2019  ? Intractable low back pain 04/09/2019  ? Compression fracture of lumbosacral vertebra, nontraumatic, initial encounter with retropulsion 04/09/2019  ? Sacral insufficiency fracture 04/09/2019  ? AI (aortic incompetence) 12/30/2017  ? OP (osteoporosis) 12/30/2017  ? S/P shoulder replacement 11/26/2013  ? ANEMIA, IRON DEFICIENCY 04/05/2009  ? HEMORRHOIDS, INTERNAL 04/05/2009  ? GERD 04/05/2009  ? CONSTIPATION 04/05/2009  ? ARTHRITIS 04/05/2009  ? ? ?Orientation RESPIRATION BLADDER Height & Weight   ?  ?Self ? Normal (99% on room air) External catheter Weight: 52.6 kg ?Height:  '5\' 5"'$  (165.1 cm)  ?BEHAVIORAL SYMPTOMS/MOOD NEUROLOGICAL BOWEL NUTRITION STATUS  ?     (has not had a bowel movement as of yet) Diet (Regular)  ?AMBULATORY STATUS  COMMUNICATION OF NEEDS Skin   ? (Sit to Stand: Mod assist  General transfer comment: able to stand x 2 attempts, Did not ambulate) Verbally Normal ?  ?  ?  ?    ?     ?     ? ? ?Personal Care Assistance Level of Assistance  ?Bathing, Feeding, Dressing Bathing Assistance: Maximum assistance (A lot of help needed) ?Feeding assistance: Limited assistance ?Dressing Assistance: Maximum assistance ?   ? ?Functional Limitations Info  ?Sight, Hearing, Speech Sight Info: Adequate ?Hearing Info: Adequate ?Speech Info: Adequate (Missing teeth)  ? ? ?SPECIAL CARE FACTORS FREQUENCY  ?PT (By licensed PT), OT (By licensed OT)   ?  ?PT Frequency: Min 5x weekly ?OT Frequency: Min 5x weekly ?  ?  ?  ?   ? ? ?Contractures Contractures Info: Not present  ? ? ?Additional Factors Info  ?Code Status, Allergies Code Status Info: DNR ?Allergies Info: Sulfonamide Derivatives ?  ?  ?  ?   ? ?Current Medications (06/22/2021):  This is the current hospital active medication list ?Current Facility-Administered Medications  ?Medication Dose Route Frequency Provider Last Rate Last Admin  ? acetaminophen (TYLENOL) tablet 650 mg  650 mg Oral Q6H PRN Mansy, Arvella Merles, MD   650 mg at 06/22/21 0933  ? Or  ? acetaminophen (TYLENOL) suppository 650 mg  650 mg Rectal Q6H PRN Mansy, Jan A, MD      ? cefTRIAXone (ROCEPHIN) 1 g in sodium chloride 0.9 % 100 mL IVPB  1 g Intravenous Q0600 Mansy, Arvella Merles, MD   Stopped at 06/22/21 [id number]  ? cholecalciferol (VITAMIN D3)  tablet 1,000 Units  1,000 Units Oral Daily Mansy, Arvella Merles, MD   1,000 Units at 06/22/21 2951  ? enoxaparin (LOVENOX) injection 40 mg  40 mg Subcutaneous Q24H Mansy, Jan A, MD      ? ferrous sulfate tablet 325 mg  325 mg Oral QODAY Mansy, Jan A, MD   325 mg at 06/22/21 8841  ? haloperidol (HALDOL) tablet 1 mg  1 mg Oral Q6H PRN Ralene Muskrat B, MD   1 mg at 06/22/21 1404  ? Or  ? haloperidol lactate (HALDOL) injection 1 mg  1 mg Intramuscular Q6H PRN Ralene Muskrat B, MD      ? lidocaine (LIDODERM) 5 %  1 patch  1 patch Transdermal Q12H PRN Mansy, Jan A, MD      ? ondansetron [facility]) tablet 4 mg  4 mg Oral Q6H PRN Mansy, Jan A, MD      ? Or  ? ondansetron [facility]) injection 4 mg  4 mg Intravenous Q6H PRN Mansy, Jan A, MD      ? saccharomyces boulardii (FLORASTOR) capsule 250 mg  250 mg Oral Daily Mansy, Jan A, MD   250 mg at 06/22/21 6606  ? traMADol (ULTRAM) tablet 50 mg  50 mg Oral Q6H PRN Mansy, Jan A, MD      ? traZODone (DESYREL) tablet 25 mg  25 mg Oral QHS PRN Mansy, Jan A, MD      ? trolamine salicylate (ASPERCREME) 10 % cream 1 application.  1 application. Topical PRN Mansy, Arvella Merles, MD      ? ? ? ?Discharge Medications: ?Please see discharge summary for a list of discharge medications. ? ?Relevant Imaging Results: ? ?Relevant Lab Results: ? ? ?Additional Information ?  ? ?Pete Pelt, RN ? ? ? ? ?

## 2021-06-22 NOTE — H&P (Addendum)
?  ?  ?[place] ? ? ?PATIENT NAME: Melissa [name]   ? ?MR#:  [id number] ? ?DATE OF BIRTH:  [birth year] ? ?DATE OF ADMISSION:  06/22/2021 ? ?PRIMARY CARE PHYSICIAN: [name], MD  ? ?Patient is coming from: [place] ? ?REQUESTING/REFERRING PHYSICIAN: Ward, Delice Bison, DO ? ?CHIEF COMPLAINT:  ? ?Chief Complaint  ?Patient presents with  ? Fall  ? ? ?HISTORY OF PRESENT ILLNESS:  ?Melissa [name] is a 88 y.o. Caucasian female with medical history significant for GERD, dementia, who presented to the emergency room with a [place] of accidental mechanical fall.  Her husband stated that her feet were tangled before she fell.  She denied any presyncope or syncope.  No chest pain or palpitations.  No fever or chills.  She admits to urinary frequency and urgency without significant dysuria or hematuria or flank pain.  No cough or wheezing or hemoptysis.No paresthesias or focal muscle weakness. ? ?ED Course: When she came to the ER vital signs were within normal.  Labs revealed a BUN of 28 and CBC showed leukocytosis of 15.4 with anemia better than previous levels.  UA was positive for UTI ?EKG as reviewed by me : EKG showed normal sinus rhythm with a rate of 82 with Q waves anteroseptally ?Imaging: Portable chest x-ray showed no acute cardiopulmonary disease.  Head and C-spine CT showed no acute abnormality.  There was progressive ventricular enlargement since 2020 that could represent communicating hydrocephalus and the patient has a history of NPH.  C-spine CT showed cervical spondylosis with no fracture. ? ?The patient was given a gram of IV Rocephin.  She will be admitted to a medical bed for further evaluation and management. ?PAST MEDICAL HISTORY:  ? ?Past Medical History:  ?Diagnosis Date  ? Arthritis   ? Chest pain   ? a. 05/2015 - post-prandial.  ? GERD (gastroesophageal reflux disease)   ? a. takes Omeprazole daily, prev seen by D. Brodie.  ? Heart murmur   ? a. 05/2013 Echo: EF 55-60%, gr2 DD, mild AI/MR.  ?  History of blood transfusion   ? no abnormal reaction noted   ? History of migraine headaches   ? a. last 10/2013  ? Iron deficiency anemia   ? Joint pain   ? PONV (postoperative nausea and vomiting)   ? Rectocele 2011  ? ? ?PAST SURGICAL HISTORY:  ? ?Past Surgical History:  ?Procedure Laterality Date  ? cataract surgery  Bilateral   ? R 03/2001; L 05/2001.  ? COLONOSCOPY    ? HYSTEROSCOPY  11/1999  ? w/ resection of endometrial polyp by uterine curetting.  ? INTRAMEDULLARY (IM) NAIL INTERTROCHANTERIC Right 07/15/2020  ? Procedure: INTRAMEDULLARY (IM) NAIL INTERTROCHANTRIC;  Surgeon: Thornton Park, MD;  Location: [facility] ORS;  Service: Orthopedics;  Laterality: Right;  ? JOINT REPLACEMENT Bilateral R-9/04, L- 8/05  ? Total Knee Arthroplasties.  ? KYPHOPLASTY N/A 04/10/2019  ? Procedure: KYPHOPLASTY;  Surgeon: Hessie Knows, MD;  Location: [facility] ORS;  Service: Orthopedics;  Laterality: N/A;  ? REVERSE SHOULDER ARTHROPLASTY Right 11/26/2013  ? Procedure: RIGHT REVERSE SHOULDER ARTHROPLASTY;  Surgeon: Marin Shutter, MD;  Location: [place];  Service: Orthopedics;  Laterality: Right;  ? SHOULDER ARTHROSCOPY Right   ? THYROIDECTOMY  at age 52  ? TONSILLECTOMY    ? ? ?SOCIAL HISTORY:  ? ?Social History  ? ?Tobacco Use  ? Smoking status: Never  ? Smokeless tobacco: Never  ?Substance Use Topics  ? Alcohol use: No  ? ? ?  FAMILY HISTORY:  ? ?Family History  ?Problem Relation Age of Onset  ? Heart attack Mother   ?     died @ 80  ? Heart attack Brother   ?     died @ 9  ? Heart attack Brother   ?     died @ 56  ? Other Father   ?     died suddenly @ 35.  ? ? ?DRUG ALLERGIES:  ? ?Allergies  ?Allergen Reactions  ? Sulfonamide Derivatives Hives  ? ? ?REVIEW OF SYSTEMS:  ? ?ROS ?As per history of present illness. All pertinent systems were reviewed above. Constitutional, HEENT, cardiovascular, respiratory, GI, GU, musculoskeletal, neuro, psychiatric, endocrine, integumentary and hematologic systems were reviewed and are otherwise  negative/unremarkable except for positive findings mentioned above in the HPI. ? ? ?MEDICATIONS AT HOME:  ? ?Prior to Admission medications   ?Medication Sig Start Date End Date Taking? Authorizing Provider  ?cholecalciferol (VITAMIN D) 1000 units tablet Take 1,000 Units by mouth daily.    [provider]  ?enoxaparin (LOVENOX) 40 MG/0.4ML injection Inject 0.4 mLs (40 mg total) into the skin daily for 14 days. 07/20/20 08/03/20  Jennye Boroughs, MD  ?ferrous sulfate 325 (65 FE) MG tablet Take 1 tablet (325 mg total) by mouth every other day. 07/19/20 09/17/20  Jennye Boroughs, MD  ?lidocaine (LIDODERM) 5 % Place 1 patch onto the skin every 12 (twelve) hours. Remove & Discard patch within 12 hours or as directed by MD 11/25/20 11/25/21  Sable Feil, PA-C  ?saccharomyces boulardii (FLORASTOR) 250 MG capsule Take 250 mg by mouth daily.    [provider]  ?traMADol (ULTRAM) 50 MG tablet Take 1 tablet (50 mg total) by mouth every 6 (six) hours as needed. 07/19/20   Jennye Boroughs, MD  ?traMADol (ULTRAM) 50 MG tablet Take 1 tablet (50 mg total) by mouth every 12 (twelve) hours as needed. 11/25/20   Sable Feil, PA-C  ?trolamine salicylate (ASPERCREME) 10 % cream Apply 1 application topically as needed for muscle pain.    [provider]  ? ?  ? ?VITAL SIGNS:  ?Blood pressure (!) 129/59, pulse 76, temperature 98.4 ?F (36.9 ?C), temperature source Oral, resp. rate 20, SpO2 98 %. ? ?PHYSICAL EXAMINATION:  ?Physical Exam ? ?GENERAL:  88 y.o.-year-old patient lying in the bed with no acute distress.  ?EYES: Pupils equal, round, reactive to light and accommodation. No scleral icterus. Extraocular muscles intact.  ?HEENT: Head atraumatic, normocephalic. Oropharynx and nasopharynx clear.  ?NECK:  Supple, no jugular venous distention. No thyroid enlargement, no tenderness.  ?LUNGS: Normal breath sounds bilaterally, no wheezing, rales,rhonchi or crepitation. No use of accessory muscles of respiration.   ?CARDIOVASCULAR: Regular rate and rhythm, S1, S2 normal. No murmurs, rubs, or gallops.  ?ABDOMEN: Soft, nondistended, nontender. Bowel sounds present. No organomegaly or mass.  ?EXTREMITIES: No pedal edema, cyanosis, or clubbing.  ?NEUROLOGIC: Cranial nerves II through XII are intact. Muscle strength 5/5 in all extremities. Sensation intact. Gait not checked.  ?PSYCHIATRIC: The patient is alert and oriented x 3.  Normal affect and good eye contact. ?SKIN: No obvious rash, lesion, or ulcer.  ? ?LABORATORY PANEL:  ? ?CBC ?Recent Labs  ?Lab 06/22/21 ?7741  ?WBC 15.2*  ?HGB 8.9*  ?HCT 27.5*  ?PLT 291  ? ?------------------------------------------------------------------------------------------------------------------ ? ?Chemistries  ?Recent Labs  ?Lab 06/22/21 ?2878  ?NA 139  ?K 3.6  ?CL 108  ?CO2 21*  ?GLUCOSE 107*  ?BUN 24*  ?CREATININE 0.71  ?  CALCIUM 8.0*  ? ?------------------------------------------------------------------------------------------------------------------ ? ?Cardiac Enzymes ?No results for input(s): TROPONINI in the last 168 hours. ?------------------------------------------------------------------------------------------------------------------ ? ?RADIOLOGY:  ?CT HEAD WO CONTRAST (5MM) ? ?Result Date: 06/21/2021 ?CLINICAL DATA:  Fall 2 days ago.  Head injury. EXAM: CT HEAD WITHOUT CONTRAST CT CERVICAL SPINE WITHOUT CONTRAST TECHNIQUE: Multidetector CT imaging of the head and cervical spine was performed following the standard protocol without intravenous contrast. Multiplanar CT image reconstructions of the cervical spine were also generated. RADIATION DOSE REDUCTION: This exam was performed according to the departmental dose-optimization program which includes automated exposure control, adjustment of the mA and/or kV according to patient size and/or use of iterative reconstruction technique. COMPARISON:  CT head 06/25/2018 FINDINGS: CT HEAD FINDINGS Brain: Moderate ventricular enlargement, with  progression since 2020. Temporal horns with progressive dilatation. Ventricular enlargement is greater than expected for the level of atrophy. Periventricular white matter hypodensity bilaterally unchanged. Hypodensity i

## 2021-06-22 NOTE — Progress Notes (Signed)
Mobility Specialist - Progress Note ? ? 06/22/21 1400  ?Mobility  ?Activity Ambulated with assistance to bathroom  ?Level of Assistance Standby assist, set-up cues, supervision of patient - no hands on  ?Assistive Device Front wheel walker  ?Distance Ambulated (ft) 15 ft  ?Activity Response Tolerated well  ?$Mobility charge 1 Mobility  ? ? ? ?Pt lying in bed upon arrival, requesting assistance to bathroom. Pt confused. Able to achieve EOB and stand with CGA-supervision. Verbal/tactile cueing to keep RW close to body and within BOS. Redirection to tasks. Pt does tend to remove single UE from RW during ambulation. Urinal out with self peri-care. Cues for hand placement to stand from toilet. Pt left in bed with alarm set, needs in reach, spouse at bedside.  ? ? ?Kathee Delton ?Mobility Specialist ?06/22/21, 3:09 PM ? ?

## 2021-06-22 NOTE — Assessment & Plan Note (Signed)
-   We will continue PPI therapy 

## 2021-06-22 NOTE — Evaluation (Signed)
Physical Therapy Evaluation ?Patient Details ?Name: Melissa [name] ?MRN: [id number] ?DOB: 1928/07/29 ?Today's Date: 06/22/2021 ? ?History of Present Illness ? Pt admitted for UTI with complaints of fall. History includes GERD and dementia.  ?Clinical Impression ? Pt is a pleasant 88 year old female who was admitted for UTI and complaints of fall. Pt performs bed mobility with mod/max assist and transfers with mod assist. Unable to ambulate this session due to deficits. Pt demonstrates deficits with cognition/strength/balance/pain. Pt is very confused and easily distracted. Would anticipate need for +2 for safe mobility away from bed. Pt appears familiar with RW, however unsure of accurate baseline. Very high falls risk. Would benefit from skilled PT to address above deficits and promote optimal return to PLOF; recommend transition to STR upon discharge from acute hospitalization. ? ?   ? ?Recommendations for follow up therapy are one component of a multi-disciplinary discharge planning process, led by the attending physician.  Recommendations may be updated based on patient status, additional functional criteria and insurance authorization. ? ?Follow Up Recommendations Skilled nursing-short term rehab (<3 hours/day) ? ?  ?Assistance Recommended at Discharge Frequent or constant Supervision/Assistance  ?Patient can return home with the following ? Two people to help with walking and/or transfers;A lot of help with bathing/dressing/bathroom;Help with stairs or ramp for entrance ? ?  ?Equipment Recommendations  (TBD)  ?Recommendations for Other Services ?    ?  ?Functional Status Assessment Patient has had a recent decline in their functional status and demonstrates the ability to make significant improvements in function in a reasonable and predictable amount of time.  ? ?  ?Precautions / Restrictions Precautions ?Precautions: Fall ?Restrictions ?Weight Bearing Restrictions: No  ? ?  ? ?Mobility ? Bed Mobility ?Overal bed  mobility: Needs Assistance ?Bed Mobility: Supine to Sit, Sit to Supine ?  ?  ?Supine to sit: Mod assist ?Sit to supine: Max assist ?  ?General bed mobility comments: limited due to cognition. Once seated at EOB, tends to start sliding off bed due to poor balance. Able to maintain with min assist at times. ?  ? ?Transfers ?Overall transfer level: Needs assistance ?Equipment used: Rolling walker (2 wheels) ?Transfers: Sit to/from Stand ?Sit to Stand: Mod assist ?  ?  ?  ?  ?  ?General transfer comment: able to stand x 2 attempts, however unable to follow commands for further gait training. Unsafe to step away from bed. Returned back to supine ?  ? ?Ambulation/Gait ?  ?  ?  ?  ?  ?  ?  ?General Gait Details: unable ? ?Stairs ?  ?  ?  ?  ?  ? ?Wheelchair Mobility ?  ? ?Modified Rankin (Stroke Patients Only) ?  ? ?  ? ?Balance Overall balance assessment: Needs assistance, History of Falls ?Sitting-balance support: Feet supported ?Sitting balance-Leahy Scale: Poor ?  ?  ?Standing balance support: Reliant on assistive device for balance, Bilateral upper extremity supported ?Standing balance-Leahy Scale: Zero ?  ?  ?  ?  ?  ?  ?  ?  ?  ?  ?  ?  ?   ? ? ? ?Pertinent Vitals/Pain Pain Assessment ?Pain Assessment: Faces ?Faces Pain Scale: Hurts a little bit ?Pain Location: L knee with movement ?Pain Descriptors / Indicators: Aching ?Pain Intervention(s): Limited activity within patient's tolerance, Repositioned  ? ? ?Home Living Family/patient expects to be discharged to:: Private residence (indep living) ?Living Arrangements: Spouse/significant other ?Available Help at Discharge: Family;Available 24 hours/day ?Type of  Home: Independent living facility ?Home Access: Level entry ?  ?  ?  ?Home Layout: One level ?  ?Additional Comments: pt is poor historian, confused. All history obtained from chart review.  ?  ?Prior Function Prior Level of Function : Patient poor historian/Family not available ?  ?  ?  ?  ?  ?  ?Mobility  Comments: Per chart review, was previously ambulatory using RW. HIstory of falls. Pt is poor historian ?  ?  ? ? ?Hand Dominance  ?   ? ?  ?Extremity/Trunk Assessment  ? Upper Extremity Assessment ?Upper Extremity Assessment: Generalized weakness (B UE grossly 3/5) ?  ? ?Lower Extremity Assessment ?Lower Extremity Assessment: Generalized weakness (B LE grossly 3+/5) ?  ? ?   ?Communication  ? Communication: No difficulties  ?Cognition Arousal/Alertness: Awake/alert ?Behavior During Therapy: [facility] for tasks assessed/performed ?Overall Cognitive Status: No family/caregiver present to determine baseline cognitive functioning ?  ?  ?  ?  ?  ?  ?  ?  ?  ?  ?  ?  ?  ?  ?  ?  ?General Comments: alert to self only. Is able to state she lives at [facility], but unable to recall where she is or why she is here ?  ?  ? ?  ?General Comments   ? ?  ?Exercises Other Exercises ?Other Exercises: supine ther-ex performed on B LE including quad sets, SLRs, and hip abd/add. 10 reps performed with minimal pain in L leg. Min assist required for performance  ? ?Assessment/Plan  ?  ?PT Assessment Patient needs continued PT services  ?PT Problem List Decreased strength;Decreased balance;Decreased mobility;Decreased cognition ? ?   ?  ?PT Treatment Interventions Gait training;DME instruction;Therapeutic activities;Therapeutic exercise;Balance training   ? ?PT Goals (Current goals can be found in the Care Plan section)  ?Acute Rehab PT Goals ?Patient Stated Goal: unable to state ?PT Goal Formulation: Patient unable to participate in goal setting ?Time For Goal Achievement: 07/06/21 ?Potential to Achieve Goals: Fair ? ?  ?Frequency Min 2X/week ?  ? ? ?Co-evaluation   ?  ?  ?  ?  ? ? ?  ?AM-PAC PT "6 Clicks" Mobility  ?Outcome Measure Help needed turning from your back to your side while in a flat bed without using bedrails?: A Little ?Help needed moving from lying on your back to sitting on the side of a flat bed without using bedrails?: A  Little ?Help needed moving to and from a bed to a chair (including a wheelchair)?: A Lot ?Help needed standing up from a chair using your arms (e.g., wheelchair or bedside chair)?: A Lot ?Help needed to walk in hospital room?: Total ?Help needed climbing 3-5 steps with a railing? : Total ?6 Click Score: 12 ? ?  ?End of Session Equipment Utilized During Treatment: Gait belt ?Activity Tolerance: Patient tolerated treatment well ?Patient left: in bed ?Nurse Communication: Mobility status ?PT Visit Diagnosis: Muscle weakness (generalized) (M62.81);History of falling (Z91.81);Difficulty in walking, not elsewhere classified (R26.2);Pain ?Pain - Right/Left: Left ?Pain - part of body: Knee ?  ? ?Time: 6967-8938 ?PT Time Calculation (min) (ACUTE ONLY): 15 min ? ? ?Charges:   PT Evaluation ?$PT Eval Low Complexity: 1 Low ?PT Treatments ?$Therapeutic Exercise: 8-22 mins ?  ?   ? ? ?Greggory Stallion, PT, DPT, GCS ?[phone] ? ? ?Alfreida Steffenhagen ?06/22/2021, 1:05 PM ? ?

## 2021-06-22 NOTE — Assessment & Plan Note (Signed)
-   The patient will be admitted to a medical bed. ?- We will continue antibiotic therapy with IV Rocephin. ?- We will follow her CBC, urine and blood cultures. ?

## 2021-06-22 NOTE — Care Management (Addendum)
Physical Therapy recommends SNF, no OT consult, order placed.  FL2 started, however no hospitalist assigned, TOC awaiting OT consult and Hospitalist to sign FL2 prior to starting bed search. ? ? ?Addendum:  3295 Dr. Priscella Mann, hospitalist assigned to patient.  OT order and fL2 sent for signature. ?

## 2021-06-22 NOTE — Assessment & Plan Note (Signed)
-   We will continue Neurontin. ?

## 2021-06-22 NOTE — ED Provider Notes (Signed)
? ?[facility] ?Provider Note ? ? ? Event Date/Time  ? First MD Initiated Contact with Patient 06/22/21 0110   ?  (approximate) ? ? ?History  ? ?Fall ? ? ?HPI ? ?Melissa [name] is a 88 y.o. female with history of anemia who presents to the emergency department with her husband from [facility] independent living facility after she had an unwitnessed fall.  Patient is not sure why she fell.  Husband states he does not abnormal for her to be unsteady on her feet.  States she uses a walker at baseline.  States since 2020 she has had confusion, urinary incontinence and difficulty with ambulation.  She has never been diagnosed with normal pressure hydrocephalus or seeing a neurologist.  He states today she seems weaker than normal and has been complaining of right leg pain since the fall and has not been able to ambulate normally since.  No known fevers, cough, vomiting or diarrhea.  States she does not think she hit her head but fall was unwitnessed.  Not on blood thinners. ? ? ?History provided by patient and wife. ? ? ? ?Past Medical History:  ?Diagnosis Date  ? Arthritis   ? Chest pain   ? a. 05/2015 - post-prandial.  ? GERD (gastroesophageal reflux disease)   ? a. takes Omeprazole daily, prev seen by D. Brodie.  ? Heart murmur   ? a. 05/2013 Echo: EF 55-60%, gr2 DD, mild AI/MR.  ? History of blood transfusion   ? no abnormal reaction noted   ? History of migraine headaches   ? a. last 10/2013  ? Iron deficiency anemia   ? Joint pain   ? PONV (postoperative nausea and vomiting)   ? Rectocele 2011  ? ? ?Past Surgical History:  ?Procedure Laterality Date  ? cataract surgery  Bilateral   ? R 03/2001; L 05/2001.  ? COLONOSCOPY    ? HYSTEROSCOPY  11/1999  ? w/ resection of endometrial polyp by uterine curetting.  ? INTRAMEDULLARY (IM) NAIL INTERTROCHANTERIC Right 07/15/2020  ? Procedure: INTRAMEDULLARY (IM) NAIL INTERTROCHANTRIC;  Surgeon: Thornton Park, MD;  Location: [facility] ORS;  Service: Orthopedics;   Laterality: Right;  ? JOINT REPLACEMENT Bilateral R-9/04, L- 8/05  ? Total Knee Arthroplasties.  ? KYPHOPLASTY N/A 04/10/2019  ? Procedure: KYPHOPLASTY;  Surgeon: Hessie Knows, MD;  Location: [facility] ORS;  Service: Orthopedics;  Laterality: N/A;  ? REVERSE SHOULDER ARTHROPLASTY Right 11/26/2013  ? Procedure: RIGHT REVERSE SHOULDER ARTHROPLASTY;  Surgeon: Marin Shutter, MD;  Location: [facility];  Service: Orthopedics;  Laterality: Right;  ? SHOULDER ARTHROSCOPY Right   ? THYROIDECTOMY  at age 5  ? TONSILLECTOMY    ? ? ?MEDICATIONS:  ?Prior to Admission medications   ?Medication Sig Start Date End Date Taking? Authorizing Provider  ?cholecalciferol (VITAMIN D) 1000 units tablet Take 1,000 Units by mouth daily.    [provider]  ?enoxaparin (LOVENOX) 40 MG/0.4ML injection Inject 0.4 mLs (40 mg total) into the skin daily for 14 days. 07/20/20 08/03/20  Jennye Boroughs, MD  ?ferrous sulfate 325 (65 FE) MG tablet Take 1 tablet (325 mg total) by mouth every other day. 07/19/20 09/17/20  Jennye Boroughs, MD  ?lidocaine (LIDODERM) 5 % Place 1 patch onto the skin every 12 (twelve) hours. Remove & Discard patch within 12 hours or as directed by MD 11/25/20 11/25/21  Sable Feil, PA-C  ?saccharomyces boulardii (FLORASTOR) 250 MG capsule Take 250 mg by mouth daily.    [provider]  ?traMADol (ULTRAM) 50 MG tablet Take 1 tablet (50 mg total) by mouth every 6 (six) hours as needed. 07/19/20   Jennye Boroughs, MD  ?traMADol (ULTRAM) 50 MG tablet Take 1 tablet (50 mg total) by mouth every 12 (twelve) hours as needed. 11/25/20   Sable Feil, PA-C  ?trolamine salicylate (ASPERCREME) 10 % cream Apply 1 application topically as needed for muscle pain.    [provider]  ? ? ?Physical Exam  ? ?Triage Vital Signs: ?ED Triage Vitals  ?Enc Vitals Group  ?   BP 06/21/21 1858 139/70  ?   Pulse Rate 06/21/21 1858 79  ?   Resp 06/21/21 1858 18  ?   Temp 06/21/21 1858 98.4 ?F (36.9 ?C)  ?   Temp Source 06/21/21 1858 Oral   ?   SpO2 06/21/21 1858 100 %  ?   Weight --   ?   Height --   ?   Head Circumference --   ?   Peak Flow --   ?   Pain Score 06/21/21 1856 0  ?   Pain Loc --   ?   Pain Edu? --   ?   Excl. in [place]? --   ? ? ?Most recent vital signs: ?Vitals:  ? 06/21/21 2332 06/22/21 0246  ?BP: (!) 141/79 (!) 129/59  ?Pulse: 82 76  ?Resp: 16 20  ?Temp:    ?SpO2: 98% 98%  ? ? ? ?CONSTITUTIONAL: Alert and oriented and responds appropriately to questions. Well-appearing; well-nourished; GCS 81, elderly ?HEAD: Normocephalic; atraumatic ?EYES: Conjunctivae clear, PERRL, EOMI ?ENT: normal nose; no rhinorrhea; moist mucous membranes; pharynx without lesions noted; no dental injury; no septal hematoma, no epistaxis; no facial deformity or bony tenderness ?NECK: Supple, no midline spinal tenderness, step-off or deformity; trachea midline ?CARD: RRR; S1 and S2 appreciated; no murmurs, no clicks, no rubs, no gallops ?RESP: Normal chest excursion without splinting or tachypnea; breath sounds clear and equal bilaterally; no wheezes, no rhonchi, no rales; no hypoxia or respiratory distress ?CHEST:  chest wall stable, no crepitus or ecchymosis or deformity, nontender to palpation; no flail chest ?ABD/GI: Normal bowel sounds; non-distended; soft, non-tender, no rebound, no guarding; no ecchymosis or other lesions noted ?PELVIS:  stable, nontender to palpation ?BACK:  The back appears normal; no midline spinal tenderness, step-off or deformity ?EXT: Tender to palpation over the right knee without deformity.  Normal ROM in all joints; otherwise all other extremities are non-tender to palpation; no edema; normal capillary refill; no cyanosis, no bony tenderness or bony deformity of patient's extremities, no joint effusion, compartments are soft, extremities are warm and well-perfused, no ecchymosis ?SKIN: Normal color for age and race; warm ?NEURO: No facial asymmetry, normal speech, moving all extremities equally ? ?ED Results / Procedures / Treatments   ? ?LABS: ?(all labs ordered are listed, but only abnormal results are displayed) ?Labs Reviewed  ?CBC - Abnormal; Notable for the following components:  ?    Result Value  ? WBC 15.4 (*)   ? RBC 2.98 (*)   ? Hemoglobin 9.6 (*)   ? HCT 29.7 (*)   ? RDW 18.6 (*)   ? All other components within normal limits  ?BASIC METABOLIC PANEL - Abnormal; Notable for the following components:  ? Glucose, Bld 122 (*)   ? BUN 28 (*)   ? Calcium 8.5 (*)   ? All other components within normal limits  ?URINALYSIS, ROUTINE W REFLEX MICROSCOPIC - Abnormal; Notable for  the following components:  ? Color, Urine YELLOW (*)   ? APPearance TURBID (*)   ? Ketones, ur 5 (*)   ? Protein, ur 100 (*)   ? Leukocytes,Ua LARGE (*)   ? WBC, UA >50 (*)   ? Bacteria, UA RARE (*)   ? All other components within normal limits  ?URINE CULTURE  ?CULTURE, BLOOD (ROUTINE X 2)  ?CULTURE, BLOOD (ROUTINE X 2)  ?LACTIC ACID, PLASMA  ?LACTIC ACID, PLASMA  ?CBC  ?BASIC METABOLIC PANEL  ? ? ? ?EKG: ? EKG Interpretation ? ?Date/Time:  Wednesday Jun 21 2021 23:37:38 EDT ?Ventricular Rate:  82 ?PR Interval:  142 ?QRS Duration: 78 ?QT Interval:  398 ?QTC Calculation: 464 ?R Axis:   33 ?Text Interpretation: Normal sinus rhythm Anteroseptal infarct , age undetermined Abnormal ECG When compared with ECG of 14-Jul-2020 19:48, PREVIOUS ECG IS PRESENT Confirmed by Pryor Curia [phone]) on 06/22/2021 1:21:07 AM ?  ? ?  ? ? ? ? ?RADIOLOGY: ?My personal review and interpretation of imaging: CT head and cervical spine show no acute traumatic injury.  X-rays of the right knee, right hip show no fracture or dislocation.  Chest x-ray shows no pneumonia. ? ?I have personally reviewed all radiology reports. ?CT HEAD WO CONTRAST (5MM) ? ?Result Date: 06/21/2021 ?CLINICAL DATA:  Fall 2 days ago.  Head injury. EXAM: CT HEAD WITHOUT CONTRAST CT CERVICAL SPINE WITHOUT CONTRAST TECHNIQUE: Multidetector CT imaging of the head and cervical spine was performed following the standard protocol  without intravenous contrast. Multiplanar CT image reconstructions of the cervical spine were also generated. RADIATION DOSE REDUCTION: This exam was performed according to the departmental dose-optimizati

## 2021-06-22 NOTE — Assessment & Plan Note (Signed)
-   This is likely secondary to generalized weakness that could be associated with UTI. ?- PT consult will be obtained. ?- Management otherwise as above. ?

## 2021-06-23 DIAGNOSIS — N3000 Acute cystitis without hematuria: Secondary | ICD-10-CM | POA: Diagnosis not present

## 2021-06-23 DIAGNOSIS — N390 Urinary tract infection, site not specified: Secondary | ICD-10-CM | POA: Diagnosis not present

## 2021-06-23 LAB — CBC WITH DIFFERENTIAL/PLATELET
Abs Immature Granulocytes: 0.05 10*3/uL (ref 0.00–0.07)
Basophils Absolute: 0.1 10*3/uL (ref 0.0–0.1)
Basophils Relative: 1 %
Eosinophils Absolute: 0 10*3/uL (ref 0.0–0.5)
Eosinophils Relative: 0 %
HCT: 29.1 % — ABNORMAL LOW (ref 36.0–46.0)
Hemoglobin: 9.4 g/dL — ABNORMAL LOW (ref 12.0–15.0)
Immature Granulocytes: 1 %
Lymphocytes Relative: 11 %
Lymphs Abs: 1 10*3/uL (ref 0.7–4.0)
MCH: 32 pg (ref 26.0–34.0)
MCHC: 32.3 g/dL (ref 30.0–36.0)
MCV: 99 fL (ref 80.0–100.0)
Monocytes Absolute: 1 10*3/uL (ref 0.1–1.0)
Monocytes Relative: 12 %
Neutro Abs: 6.7 10*3/uL (ref 1.7–7.7)
Neutrophils Relative %: 75 %
Platelets: 349 10*3/uL (ref 150–400)
RBC: 2.94 MIL/uL — ABNORMAL LOW (ref 3.87–5.11)
RDW: 18.7 % — ABNORMAL HIGH (ref 11.5–15.5)
WBC: 8.9 10*3/uL (ref 4.0–10.5)
nRBC: 0 % (ref 0.0–0.2)

## 2021-06-23 LAB — BASIC METABOLIC PANEL
Anion gap: 11 (ref 5–15)
BUN: 20 mg/dL (ref 8–23)
CO2: 23 mmol/L (ref 22–32)
Calcium: 8.5 mg/dL — ABNORMAL LOW (ref 8.9–10.3)
Chloride: 107 mmol/L (ref 98–111)
Creatinine, Ser: 0.81 mg/dL (ref 0.44–1.00)
GFR, Estimated: >60 mL/min (ref >60)
Glucose, Bld: 142 mg/dL — ABNORMAL HIGH (ref 70–99)
Potassium: 3.5 mmol/L (ref 3.5–5.1)
Sodium: 141 mmol/L (ref 135–145)

## 2021-06-23 MED ORDER — CIPROFLOXACIN HCL 500 MG PO TABS: 500.0000 mg | ORAL_TABLET | Freq: Two times a day (BID) | ORAL | 0 refills | Status: AC

## 2021-06-23 NOTE — Evaluation (Signed)
Occupational Therapy Evaluation ?Patient Details ?Name: NIKOLE SWARTZENTRUBER ?MRN: [id number] ?DOB: 12-Aug-1928 ?Today's Date: 06/23/2021 ? ? ?History of Present Illness Pt admitted for UTI with complaints of fall. History includes GERD and dementia.  ? ?Clinical Impression ?  ?Ms. Yeomans presents with generalized weakness, limited endurance, and fatigue, following a mechanical fall at home. Pt lives at [facility], in assisted living section, with husband, and has home health care attendants every morning and evening to assist with dressing, bathing, toileting, eating, transferring.  She ambulates with a RW, with 2 falls reported in the previous year, and transfers alone during the night from bed to [facility] as needed. She appears to be at her baseline level of fxl mobility, reports no pain, has staff in place at home and all needed DME.  No further OT services required at present. Will sign off.      ? ?Recommendations for follow up therapy are one component of a multi-disciplinary discharge planning process, led by the attending physician.  Recommendations may be updated based on patient status, additional functional criteria and insurance authorization.  ? ?Follow Up Recommendations ? No OT follow up  ?  ?Assistance Recommended at Discharge Frequent or constant Supervision/Assistance  ?Patient can return home with the following A little help with walking and/or transfers;A little help with bathing/dressing/bathroom;Assistance with cooking/housework;Direct supervision/assist for medications management ? ?  ?Functional Status Assessment ? Patient has not had a recent decline in their functional status  ?Equipment Recommendations ? None recommended by OT  ?  ?Recommendations for Other Services   ? ? ?  ?Precautions / Restrictions Precautions ?Precautions: Fall ?Restrictions ?Weight Bearing Restrictions: No  ? ?  ? ?Mobility Bed Mobility ?Overal bed mobility: Needs Assistance ?Bed Mobility: Supine to Sit, Sit to Supine ?  ?  ?Supine  to sit: Min guard ?Sit to supine: Min guard ?  ?  ?  ? ?Transfers ?  ?Equipment used: Rolling walker (2 wheels) ?Transfers: Sit to/from Stand, Bed to chair/wheelchair/BSC ?Sit to Stand: Min assist ?  ?  ?  ?  ?  ?General transfer comment: Able to transfer bed<>recliner, bed<>BSC with Min A, extra time and effort required ?  ? ?  ?Balance Overall balance assessment: Needs assistance, History of Falls ?Sitting-balance support: Feet supported ?Sitting balance-Leahy Scale: Good ?  ?  ?Standing balance support: Reliant on assistive device for balance, Bilateral upper extremity supported ?Standing balance-Leahy Scale: Fair ?  ?  ?  ?  ?  ?  ?  ?  ?  ?  ?  ?  ?   ? ?ADL either performed or assessed with clinical judgement  ? ?ADL Overall ADL's : Needs assistance/impaired ?Eating/Feeding: Minimal assistance ?Eating/Feeding Details (indicate cue type and reason): Per pt/spouse, she normally feeds herself at home, following set-up, but husband is feeding pt here. Pt able to feed self when prompted. ?Grooming: Wash/dry hands;Wash/dry face;Set up;Modified independent ?  ?  ?  ?  ?  ?  ?  ?Lower Body Dressing: Moderate assistance;Sit to/from stand ?Lower Body Dressing Details (indicate cue type and reason): donning/doffing underwear ?Toilet Transfer: BSC/3in1;Rolling walker (2 wheels);Stand-pivot;Moderate assistance ?Toilet Transfer Details (indicate cue type and reason): Pt requires cueing for shifting feet to position on BSC ?Toileting- Clothing Manipulation and Hygiene: Moderate assistance;Sit to/from stand ?  ?  ?  ?Functional mobility during ADLs: Min guard ?   ? ? ? ?Vision   ?   ?   ?Perception   ?  ?Praxis   ?  ? ?  Pertinent Vitals/Pain Pain Assessment ?Pain Assessment: No/denies pain  ? ? ? ?Hand Dominance Right ?  ?Extremity/Trunk Assessment Upper Extremity Assessment ?Upper Extremity Assessment: Generalized weakness ?  ?Lower Extremity Assessment ?Lower Extremity Assessment: Generalized weakness ?  ?  ?  ?Communication  Communication ?Communication: No difficulties ?  ?Cognition Arousal/Alertness: Awake/alert ?Behavior During Therapy: [facility] for tasks assessed/performed ?Overall Cognitive Status: Within Functional Limits for tasks assessed ?  ?  ?  ?  ?  ?  ?  ?  ?  ?  ?  ?  ?  ?  ?  ?  ?General Comments: Pt is alert, oriented to time, place, situation. Very pleasant. ?  ?  ?General Comments  Pt received in bed with IV pulled out, RN notified ? ?  ?Exercises Other Exercises ?Other Exercises: Educ re: falls prevention, safe transfer techniques ?  ?Shoulder Instructions    ? ? ?Home Living Family/patient expects to be discharged to:: Private residence ?Living Arrangements: Spouse/significant other ?Available Help at Discharge: Family;Available 24 hours/day ?Type of Home: Independent living facility ?Home Access: Level entry ?  ?  ?Home Layout: One level ?  ?  ?Bathroom Shower/Tub: Walk-in shower ?  ?Bathroom Toilet: Handicapped height ?Bathroom Accessibility: Yes ?  ?Home Equipment: Conservation officer, nature (2 wheels);Shower seat;Rollator (4 wheels);Grab bars - tub/shower;BSC/3in1 ?  ?  ?  ? ?  ?Prior Functioning/Environment Prior Level of Function : Needs assist ?  ?  ?  ?  ?  ?  ?Mobility Comments: Uses RW for ambulation, 2 falls in previous 12 months ?ADLs Comments: Pt receives assistance from husband and home health aides for all ADLs ?  ? ?  ?  ?OT Problem List: Decreased strength;Decreased activity tolerance;Impaired balance (sitting and/or standing);Decreased cognition ?  ?   ?OT Treatment/Interventions:    ?  ?OT Goals(Current goals can be found in the care plan section) Acute Rehab OT Goals ?Patient Stated Goal: to go home today ?OT Goal Formulation: With patient ?Time For Goal Achievement: 07/07/21 ?Potential to Achieve Goals: Good  ?OT Frequency:   ?  ? ?Co-evaluation   ?  ?  ?  ?  ? ?  ?AM-PAC OT "6 Clicks" Daily Activity     ?Outcome Measure Help from another person eating meals?: A Little ?Help from another person taking care of  personal grooming?: A Little ?Help from another person toileting, which includes using toliet, bedpan, or urinal?: A Lot ?Help from another person bathing (including washing, rinsing, drying)?: A Lot ?Help from another person to put on and taking off regular upper body clothing?: A Little ?Help from another person to put on and taking off regular lower body clothing?: A Lot ?6 Click Score: 15 ?  ?End of Session Equipment Utilized During Treatment: Rolling walker (2 wheels) ? ?Activity Tolerance: Patient tolerated treatment well ?Patient left: in bed;with nursing/sitter in room;with family/visitor present;with call bell/phone within reach;with bed alarm set ? ?OT Visit Diagnosis: Unsteadiness on feet (R26.81);Muscle weakness (generalized) (M62.81)  ?              ?Time: 6754-4920 ?OT Time Calculation (min): 54 min ?Charges:  OT General Charges ?$OT Visit: 1 Visit ?OT Evaluation ?$OT Eval Moderate Complexity: 1 Mod ?OT Treatments ?$Self Care/Home Management : 38-52 mins ?Josiah Lobo, PhD, MS, OTR/L ?06/23/21, 11:45 AM ? ?

## 2021-06-23 NOTE — Care Management CC44 (Signed)
Condition Code 44 Documentation Completed ? ?Patient Details  ?Name: Melissa [name] ?MRN: [id number] ?Date of Birth: November 12, 1928 ? ? ?Condition Code 44 given:   yes ?Patient signature on Condition Code 44 notice:  no, obtained verbally  ?Documentation of 2 MD's agreement:   yes ?Code 44 added to claim:   yes ? ? ? ?Pete Pelt, RN ?06/23/2021, 12:56 PM ? ?

## 2021-06-23 NOTE — Progress Notes (Signed)
Physical Therapy Treatment ?Patient Details ?Name: Melissa [name] ?MRN: [id number] ?DOB: [birth year] ?Today's Date: 06/23/2021 ? ? ?History of Present Illness Pt admitted for UTI with complaints of fall. History includes GERD and dementia. ? ?  ?PT Comments  ? ? Pt received seated in recliner upon arrival to room and pt agreeable to therapy.  Pt able to perform good mechanics when transferring, just needing supervision and increased time.  Pt attempted to perform subsequent attempts, prior to being able to perform.  Pt then ambulated around the nursing station with good effort, taking several rest breaks throughout, and needing significant cuing for increased step length and staying within the walker.  Could benefit from a smaller, pediatric walker for home use if husband does not already have one for her.  Updated discharge plans to home with HHPT at this time.  Pt will continue to benefit from skilled therapy in order to address deficits listed below. ? ? ?   ?Recommendations for follow up therapy are one component of a multi-disciplinary discharge planning process, led by the attending physician.  Recommendations may be updated based on patient status, additional functional criteria and insurance authorization. ? ?Follow Up Recommendations ? Home health PT ?  ?  ?Assistance Recommended at Discharge Set up Supervision/Assistance  ?Patient can return home with the following Help with stairs or ramp for entrance;A little help with walking and/or transfers;A little help with bathing/dressing/bathroom ?  ?Equipment Recommendations ? None recommended by PT  ?  ?Recommendations for Other Services   ? ? ?  ?Precautions / Restrictions Precautions ?Precautions: Fall ?Restrictions ?Weight Bearing Restrictions: No  ?  ? ?Mobility ? Bed Mobility ?Overal bed mobility: Needs Assistance ?Bed Mobility: Supine to Sit, Sit to Supine ?  ?  ?Supine to sit: Min guard ?Sit to supine: Min guard ?  ?  ?  ? ?Transfers ?  ?Equipment used:  Rolling walker (2 wheels) ?Transfers: Sit to/from Stand ?Sit to Stand: Supervision ?  ?  ?  ?  ?  ?General transfer comment: Able to transfer from seated to EOB with supervision only.  Takes extra time to come upright into seated position. ?  ? ?Ambulation/Gait ?Ambulation/Gait assistance: Min guard ?Gait Distance (Feet): 160 Feet ?Assistive device: Rolling walker (2 wheels) ?Gait Pattern/deviations: Step-through pattern, Decreased stride length, Steppage, Narrow base of support ?Gait velocity: decreased ?  ?  ?General Gait Details: Pt able to ambulate with shortened steps and leaving walker too far in front of her. ? ? ?Stairs ?  ?  ?  ?  ?  ? ? ?Wheelchair Mobility ?  ? ?Modified Rankin (Stroke Patients Only) ?  ? ? ?  ?Balance   ?  ?  ?  ?  ?  ?  ?  ?  ?  ?  ?  ?  ?  ?  ?  ?  ?  ?  ?  ? ?  ?Cognition Arousal/Alertness: Awake/alert ?Behavior During Therapy: [facility] for tasks assessed/performed ?Overall Cognitive Status: Within Functional Limits for tasks assessed ?  ?  ?  ?  ?  ?  ?  ?  ?  ?  ?  ?  ?  ?  ?  ?  ?General Comments: Pt is alert, oriented to time, place, situation. Very pleasant. ?  ?  ? ?  ?Exercises   ? ?  ?General Comments General comments (skin integrity, edema, etc.): Pt received in bed with IV pulled out, RN notified ?  ?  ? ?  Pertinent Vitals/Pain Pain Assessment ?Pain Assessment: No/denies pain  ? ? ?Home Living Family/patient expects to be discharged to:: Private residence ?Living Arrangements: Spouse/significant other ?Available Help at Discharge: Family;Available 24 hours/day ?Type of Home: Independent living facility ?Home Access: Level entry ?  ?  ?  ?Home Layout: One level ?Home Equipment: Conservation officer, nature (2 wheels);Shower seat;Rollator (4 wheels);Grab bars - tub/shower;BSC/3in1 ?   ?  ?Prior Function    ?  ?  ?   ? ?PT Goals (current goals can now be found in the care plan section)   ? ?  ?Frequency ? ? ? Min 2X/week ? ? ? ?  ?PT Plan Discharge plan needs to be updated  ? ? ?Co-evaluation   ?   ?  ?  ?  ? ?  ?AM-PAC PT "6 Clicks" Mobility   ?Outcome Measure ? Help needed turning from your back to your side while in a flat bed without using bedrails?: A Little ?Help needed moving from lying on your back to sitting on the side of a flat bed without using bedrails?: A Little ?Help needed moving to and from a bed to a chair (including a wheelchair)?: A Little ?Help needed standing up from a chair using your arms (e.g., wheelchair or bedside chair)?: A Little ?Help needed to walk in hospital room?: A Little ?Help needed climbing 3-5 steps with a railing? : A Little ?6 Click Score: 18 ? ?  ?End of Session Equipment Utilized During Treatment: Gait belt ?Activity Tolerance: Patient tolerated treatment well ?Patient left: in bed ?Nurse Communication: Mobility status ?PT Visit Diagnosis: Muscle weakness (generalized) (M62.81);History of falling (Z91.81);Difficulty in walking, not elsewhere classified (R26.2);Pain ?Pain - Right/Left: Left ?Pain - part of body: Knee ?  ? ? ?Time: 3500-9381 ?PT Time Calculation (min) (ACUTE ONLY): 36 min ? ?Charges:  $Gait Training: 8-22 mins          ?          ? ?Gwenlyn Saran, PT, DPT ?06/23/21, 1:40 PM ? ? ? ?[name] ?06/23/2021, 1:27 PM ? ?

## 2021-06-23 NOTE — TOC Initial Note (Addendum)
Transition of Care (TOC) - Initial/Assessment Note  ? ? ?Patient Details  ?Name: Melissa [name] ?MRN: [id number] ?Date of Birth: 28-Feb-1928 ? ?Transition of Care (TOC) CM/SW Contact:    ?Pete Pelt, RN ?Phone Number: ?06/23/2021, 11:12 AM ? ?Clinical Narrative:    Patient's spouse of 51 years states that he refuses SNF and would like to take patient home to care for her.  He states he has been caring for her and has "everything he needs" at home.   ? ?Spouse verbalizes no concerns about medications or transportation to appointments. ? ?Home health RN and PT ordered for home health. ?             ?Addendum:  EMS contacted 5th on list.  Patient and family aware. ? ?Expected Discharge Plan: [place] ?Barriers to Discharge: Barriers Resolved ? ? ?Patient Goals and CMS Choice ?  ?  ?Choice offered to / list presented to : NA ? ?Expected Discharge Plan and Services ?Expected Discharge Plan: [place] ?  ?Discharge Planning Services: CM Consult ?  ?Living arrangements for the past 2 months: [place] ?Expected Discharge Date: 06/23/21               ?  ?  ?  ?  ?  ?  ?  ?  ?  ?  ? ?Prior Living Arrangements/Services ?Living arrangements for the past 2 months: [place] ?Lives with:: Self, Spouse ?Patient language and need for interpreter reviewed:: Yes (No interpreter required) ?       ?Need for Family Participation in Patient Care: Yes (Comment) ?Care giver support system in place?: Yes (comment) ?  ?Criminal Activity/Legal Involvement Pertinent to Current Situation/Hospitalization: No - Comment as needed ? ?Activities of Daily Living ?Home Assistive Devices/Equipment: Gilford Rile (specify type) ?ADL Screening (condition at time of admission) ?Patient's cognitive ability adequate to safely complete daily activities?: No ?Is the patient deaf or have difficulty hearing?: Yes ?Does the patient have difficulty seeing, even when wearing glasses/contacts?: No ?Does the patient  have difficulty concentrating, remembering, or making decisions?: Yes ?Patient able to express need for assistance with ADLs?: Yes ?Does the patient have difficulty dressing or bathing?: Yes ?Independently performs ADLs?: No ?Communication: Independent ?Dressing (OT): Needs assistance ?Is this a change from baseline?: Pre-admission baseline ?Grooming: Needs assistance ?Is this a change from baseline?: Pre-admission baseline ?Feeding: Independent ?Bathing: Needs assistance ?Is this a change from baseline?: Pre-admission baseline ?Toileting: Needs assistance ?Is this a change from baseline?: Pre-admission baseline ?In/Out Bed: Needs assistance ?Is this a change from baseline?: Pre-admission baseline ?Does the patient have difficulty walking or climbing stairs?: Yes ?Weakness of Legs: Both ?Weakness of Arms/Hands: None ? ?Permission Sought/Granted ?Permission sought to share information with : Case Manager ?Permission granted to share information with : Yes, Verbal Permission Granted ?   ? Permission granted to share info w AGENCY: Home health ?   ?   ? ?Emotional Assessment ?Appearance:: Appears stated age ?Attitude/Demeanor/Rapport: Gracious, Engaged ?Affect (typically observed): Pleasant ?  ?Alcohol / Substance Use: Not Applicable ?Psych Involvement: No (comment) ? ?Admission diagnosis:  UTI (urinary tract infection) [N39.0] ?Acute UTI [N39.0] ?Generalized weakness [R53.1] ?Fall, initial encounter [W19.XXXA] ?Patient Active Problem List  ? Diagnosis Date Noted  ? UTI (urinary tract infection) 06/22/2021  ? Fall at home, initial encounter 06/22/2021  ? GERD without esophagitis 06/22/2021  ? Peripheral neuropathy 06/22/2021  ? Closed intertrochanteric fracture of hip, right, initial encounter ([place])  07/14/2020  ? Hardening of the aorta (main artery of the heart) ([place]) 07/14/2020  ? Accidental fall 07/14/2020  ? Compression fx, lumbar spine ([place]) 04/11/2019  ? Intractable low back pain 04/09/2019  ? Compression fracture  of lumbosacral vertebra, nontraumatic, initial encounter with retropulsion 04/09/2019  ? Sacral insufficiency fracture 04/09/2019  ? AI (aortic incompetence) 12/30/2017  ? OP (osteoporosis) 12/30/2017  ? S/P shoulder replacement 11/26/2013  ? ANEMIA, IRON DEFICIENCY 04/05/2009  ? HEMORRHOIDS, INTERNAL 04/05/2009  ? GERD 04/05/2009  ? CONSTIPATION 04/05/2009  ? ARTHRITIS 04/05/2009  ? ?PCP:  [name], MD ?Pharmacy:   ?[name], [address] [place] [address] ?[place] [address] ?Phone: [phone] Fax: [phone] ? ?[id number]?Phone: [phone]: [phone]? ? ? ? ?Social Determinants of Health (SDOH) Interventions ?  ? ?Readmission Risk Interventions ?   ? View : No data to display.  ?  ?  ?  ? ? ? ?

## 2021-06-23 NOTE — Discharge Summary (Signed)
Physician Discharge Summary  ?Melissa [name] TKZ:[id number] DOB: 06/07/1928 DOA: 06/22/2021 ? ?PCP: [name], MD ? ?Admit date: 06/22/2021 ?Discharge date: 06/23/2021 ? ?Admitted From: Independent living [facility] ?Disposition: Back to [facility] ? ?Recommendations for Outpatient Follow-up:  ?Follow up with PCP in 1-2 weeks ? ? ?Home Health: Yes PT RN ?Equipment/Devices: None ? ?Discharge Condition: Stable ?CODE STATUS: DNR ?Diet recommendation: Regular ? ?Brief/Interim Summary: ?88 year old Caucasian female with history significant for GERD, cognitive decline who presents to the ED after a accidental mechanical fall.  No evidence of syncopal event.  No chest pain or palpitations.  No evidence of seizure activity.  No loss of bowel or bladder continence or evidence of tongue biting. ?  ?Initial work-up significant for urinary tract infection.  Also a CT head done that demonstrated some concern for normal pressure hydrocephalus.  Lumbar puncture was discussed between EDP and patient's husband.  At this time deferring lumbar tap due to patient's advanced age. ? ?Some issues with encephalopathy and confusion while hospitalized.  Overall relatively minor.  No overt agitation or delirium noted.  Patient at baseline level of mentation on day of discharge.  Discussed at bedside at length with patient's husband.  Patient is back to baseline and is stable for discharge at this time.  Will discharge back to [facility].  Will complete additional 3 days p.o. antibiotics for total 5-day course.  Prescription sent to pharmacy of husband's choosing.  Patient discharged back to [facility] in stable condition. ? ? ? ?Discharge Diagnoses:  ?Principal Problem: ?  UTI (urinary tract infection) ?Active Problems: ?  Fall at home, initial encounter ?  GERD without esophagitis ?  Peripheral neuropathy ? ?Urinary tract infection ?Acute metabolic encephalopathy secondary to above ?Patient was treated with IV Rocephin while hospitalized.  Urine  culture and sensitivities are pending at time of discharge but considering patient's hemodynamic stability and return to baseline level of mentation she will be discharged home.  Will transition to ciprofloxacin 500 twice daily.  Will complete a total 5-day antibiotic course.  3 additional days prescribed on discharge.  Patient will return to [facility] of her husband.  Home health PT and RN have been ordered. ? ?Discharge Instructions ? ?Discharge Instructions   ? ? Diet - low sodium heart healthy   Complete by: As directed ?  ? Increase activity slowly   Complete by: As directed ?  ? ?  ? ?Allergies as of 06/23/2021   ? ?   Reactions  ? Sulfonamide Derivatives Hives  ? ?  ? ?  ?Medication List  ?  ? ?STOP taking these medications   ? ?enoxaparin 40 MG/0.4ML injection ?Commonly known as: LOVENOX ?  ?lidocaine 5 % ?Commonly known as: Lidoderm ?  ? ?  ? ?TAKE these medications   ? ?cholecalciferol 1000 units tablet ?Commonly known as: VITAMIN D ?Take 1,000 Units by mouth daily. ?  ?ciprofloxacin 500 MG tablet ?Commonly known as: Cipro ?Take 1 tablet (500 mg total) by mouth 2 (two) times daily for 3 days. ?Start taking on: Jun 24, 2021 ?  ?ferrous sulfate 325 (65 FE) MG tablet ?Take 1 tablet (325 mg total) by mouth every other day. ?  ?saccharomyces boulardii 250 MG capsule ?Commonly known as: FLORASTOR ?Take 250 mg by mouth daily. ?  ?traMADol 50 MG tablet ?Commonly known as: ULTRAM ?Take 1 tablet (50 mg total) by mouth every 6 (six) hours as needed. ?  ?trolamine salicylate 10 %  cream ?Commonly known as: ASPERCREME ?Apply 1 application topically as needed for muscle pain. ?  ? ?  ? ? ?Allergies  ?Allergen Reactions  ? Sulfonamide Derivatives Hives  ? ? ?Consultations: ?None ? ? ?Procedures/Studies: ?CT HEAD WO CONTRAST (5MM) ? ?Result Date: 06/21/2021 ?CLINICAL DATA:  Fall 2 days ago.  Head injury. EXAM: CT HEAD WITHOUT CONTRAST CT CERVICAL SPINE WITHOUT CONTRAST TECHNIQUE: Multidetector CT imaging of the  head and cervical spine was performed following the standard protocol without intravenous contrast. Multiplanar CT image reconstructions of the cervical spine were also generated. RADIATION DOSE REDUCTION: This exam was performed according to the departmental dose-optimization program which includes automated exposure control, adjustment of the mA and/or kV according to patient size and/or use of iterative reconstruction technique. COMPARISON:  CT head 06/25/2018 FINDINGS: CT HEAD FINDINGS Brain: Moderate ventricular enlargement, with progression since 2020. Temporal horns with progressive dilatation. Ventricular enlargement is greater than expected for the level of atrophy. Periventricular white matter hypodensity bilaterally unchanged. Hypodensity in the right occipital lobe compatible with ischemia appears chronic. This was not present in 2020. Chronic infarct right superior cerebellum not present in 2020. Negative for acute infarct, hemorrhage, mass Vascular: Negative for hyperdense vessel Skull: Negative Sinuses/Orbits: Paranasal sinuses clear.  Negative orbit Other: None CT CERVICAL SPINE FINDINGS Alignment: Mild anterolisthesis C4-5. Mild retrolisthesis C5-6 and C6-7 Skull base and vertebrae: Negative for fracture Soft tissues and spinal canal: Negative Disc levels: Multilevel disc and facet degeneration with spurring. Disc degeneration and spurring most prominent at C5-6 with prominent right-sided osteophyte. Spinal stenosis and bilateral foraminal encroachment due to spurring at C5-6 and to lesser extent C6-7. Upper chest: Apical pleural thickening and scarring. Other: None IMPRESSION: 1. No acute intracranial injury.  No acute hemorrhage. 2. Progressive ventricular enlargement since 2020. This could represent communicating hydrocephalus. Correlate with symptoms of NPH. Progressive chronic ischemic changes since 2020. 3. Cervical spondylosis.  Negative for cervical spine fracture Electronically Signed   By:  Franchot Gallo M.D.   On: 06/21/2021 19:43  ? ?CT Cervical Spine Wo Contrast ? ?Result Date: 06/21/2021 ?CLINICAL DATA:  Fall 2 days ago.  Head injury. EXAM: CT HEAD WITHOUT CONTRAST CT CERVICAL SPINE WITHOUT CONTRAST TECHNIQUE: Multidetector CT imaging of the head and cervical spine was performed following the standard protocol without intravenous contrast. Multiplanar CT image reconstructions of the cervical spine were also generated. RADIATION DOSE REDUCTION: This exam was performed according to the departmental dose-optimization program which includes automated exposure control, adjustment of the mA and/or kV according to patient size and/or use of iterative reconstruction technique. COMPARISON:  CT head 06/25/2018 FINDINGS: CT HEAD FINDINGS Brain: Moderate ventricular enlargement, with progression since 2020. Temporal horns with progressive dilatation. Ventricular enlargement is greater than expected for the level of atrophy. Periventricular white matter hypodensity bilaterally unchanged. Hypodensity in the right occipital lobe compatible with ischemia appears chronic. This was not present in 2020. Chronic infarct right superior cerebellum not present in 2020. Negative for acute infarct, hemorrhage, mass Vascular: Negative for hyperdense vessel Skull: Negative Sinuses/Orbits: Paranasal sinuses clear.  Negative orbit Other: None CT CERVICAL SPINE FINDINGS Alignment: Mild anterolisthesis C4-5. Mild retrolisthesis C5-6 and C6-7 Skull base and vertebrae: Negative for fracture Soft tissues and spinal canal: Negative Disc levels: Multilevel disc and facet degeneration with spurring. Disc degeneration and spurring most prominent at C5-6 with prominent right-sided osteophyte. Spinal stenosis and bilateral foraminal encroachment due to spurring at C5-6 and to lesser extent C6-7. Upper chest: Apical pleural thickening and scarring. Other:  None IMPRESSION: 1. No acute intracranial injury.  No acute hemorrhage. 2.  Progressive ventricular enlargement since 2020. This could represent communicating hydrocephalus. Correlate with symptoms of NPH. Progressive chronic ischemic changes since 2020. 3. Cervical spondylosis.  Ne

## 2021-06-24 LAB — URINE CULTURE: Culture: >=100000 — AB

## 2021-06-27 ENCOUNTER — Emergency Department
Admission: EM | Admit: 2021-06-27 | Discharge: 2021-06-28 | Disposition: A | Discharge status: Skilled Nursing Facility | Payer: Medicare Other | Attending: Emergency Medicine | Admitting: Emergency Medicine

## 2021-06-27 ENCOUNTER — Other Ambulatory Visit: Payer: Self-pay

## 2021-06-27 DIAGNOSIS — M25511 Pain in right shoulder: Secondary | ICD-10-CM | POA: Insufficient documentation

## 2021-06-27 DIAGNOSIS — S0101XA Laceration without foreign body of scalp, initial encounter: Secondary | ICD-10-CM | POA: Diagnosis not present

## 2021-06-27 DIAGNOSIS — S42019A Nondisplaced fracture of sternal end of unspecified clavicle, initial encounter for closed fracture: Secondary | ICD-10-CM | POA: Diagnosis not present

## 2021-06-27 DIAGNOSIS — S4991XA Unspecified injury of right shoulder and upper arm, initial encounter: Secondary | ICD-10-CM | POA: Diagnosis present

## 2021-06-27 DIAGNOSIS — G9389 Other specified disorders of brain: Secondary | ICD-10-CM | POA: Diagnosis not present

## 2021-06-27 DIAGNOSIS — R5381 Other malaise: Secondary | ICD-10-CM | POA: Diagnosis not present

## 2021-06-27 DIAGNOSIS — W01198A Fall on same level from slipping, tripping and stumbling with subsequent striking against other object, initial encounter: Secondary | ICD-10-CM | POA: Diagnosis not present

## 2021-06-27 DIAGNOSIS — Y92129 Unspecified place in nursing home as the place of occurrence of the external cause: Secondary | ICD-10-CM | POA: Insufficient documentation

## 2021-06-27 DIAGNOSIS — S42017A Nondisplaced fracture of sternal end of right clavicle, initial encounter for closed fracture: Secondary | ICD-10-CM | POA: Insufficient documentation

## 2021-06-27 DIAGNOSIS — S51811A Laceration without foreign body of right forearm, initial encounter: Secondary | ICD-10-CM | POA: Diagnosis not present

## 2021-06-27 DIAGNOSIS — R52 Pain, unspecified: Secondary | ICD-10-CM | POA: Diagnosis not present

## 2021-06-27 DIAGNOSIS — W19XXXA Unspecified fall, initial encounter: Secondary | ICD-10-CM | POA: Diagnosis not present

## 2021-06-27 DIAGNOSIS — S199XXA Unspecified injury of neck, initial encounter: Secondary | ICD-10-CM | POA: Diagnosis not present

## 2021-06-27 DIAGNOSIS — S0990XA Unspecified injury of head, initial encounter: Secondary | ICD-10-CM | POA: Diagnosis not present

## 2021-06-27 LAB — CULTURE, BLOOD (ROUTINE X 2)
Culture: NO GROWTH
Culture: NO GROWTH
Special Requests: ADEQUATE

## 2021-06-27 MED ORDER — ACETAMINOPHEN 500 MG PO TABS
1000.0000 mg | ORAL_TABLET | Freq: Once | ORAL | Status: AC
  Administered 2021-06-28: 1000 mg via ORAL
  Filled 2021-06-27: qty 2

## 2021-06-27 NOTE — ED Triage Notes (Signed)
pt coming from home, stood up was walking around bed room and got tripped up hit her head on the dresser and has laceration to right forehead and right arm. not on blood thinners per ems. bp 140/62, 94%, 76 HR/P. patient A&Ox3 c/o right shoulder pain.  ?

## 2021-06-28 ENCOUNTER — Emergency Department: Payer: Medicare Other

## 2021-06-28 DIAGNOSIS — M25511 Pain in right shoulder: Secondary | ICD-10-CM | POA: Diagnosis not present

## 2021-06-28 DIAGNOSIS — S199XXA Unspecified injury of neck, initial encounter: Secondary | ICD-10-CM | POA: Diagnosis not present

## 2021-06-28 DIAGNOSIS — I959 Hypotension, unspecified: Secondary | ICD-10-CM | POA: Diagnosis not present

## 2021-06-28 DIAGNOSIS — Z7401 Bed confinement status: Secondary | ICD-10-CM | POA: Diagnosis not present

## 2021-06-28 DIAGNOSIS — S0101XA Laceration without foreign body of scalp, initial encounter: Secondary | ICD-10-CM | POA: Diagnosis not present

## 2021-06-28 DIAGNOSIS — S42017A Nondisplaced fracture of sternal end of right clavicle, initial encounter for closed fracture: Secondary | ICD-10-CM | POA: Diagnosis not present

## 2021-06-28 DIAGNOSIS — G9389 Other specified disorders of brain: Secondary | ICD-10-CM | POA: Diagnosis not present

## 2021-06-28 LAB — CBC WITH DIFFERENTIAL/PLATELET
Abs Immature Granulocytes: 0.08 10*3/uL — ABNORMAL HIGH (ref 0.00–0.07)
Basophils Absolute: 0.1 10*3/uL (ref 0.0–0.1)
Basophils Relative: 1 %
Eosinophils Absolute: 0.2 10*3/uL (ref 0.0–0.5)
Eosinophils Relative: 1 %
HCT: 26.8 % — ABNORMAL LOW (ref 36.0–46.0)
Hemoglobin: 8.6 g/dL — ABNORMAL LOW (ref 12.0–15.0)
Immature Granulocytes: 1 %
Lymphocytes Relative: 16 %
Lymphs Abs: 1.8 10*3/uL (ref 0.7–4.0)
MCH: 31.9 pg (ref 26.0–34.0)
MCHC: 32.1 g/dL (ref 30.0–36.0)
MCV: 99.3 fL (ref 80.0–100.0)
Monocytes Absolute: 1.1 10*3/uL — ABNORMAL HIGH (ref 0.1–1.0)
Monocytes Relative: 10 %
Neutro Abs: 8.2 10*3/uL — ABNORMAL HIGH (ref 1.7–7.7)
Neutrophils Relative %: 71 %
Platelets: 460 10*3/uL — ABNORMAL HIGH (ref 150–400)
RBC: 2.7 MIL/uL — ABNORMAL LOW (ref 3.87–5.11)
RDW: 18.8 % — ABNORMAL HIGH (ref 11.5–15.5)
WBC: 11.4 10*3/uL — ABNORMAL HIGH (ref 4.0–10.5)
nRBC: 0.3 % — ABNORMAL HIGH (ref 0.0–0.2)

## 2021-06-28 LAB — URINALYSIS, ROUTINE W REFLEX MICROSCOPIC
Bacteria, UA: NONE SEEN
Bilirubin Urine: NEGATIVE
Glucose, UA: NEGATIVE mg/dL
Ketones, ur: NEGATIVE mg/dL
Leukocytes,Ua: NEGATIVE
Nitrite: NEGATIVE
Protein, ur: NEGATIVE mg/dL
Specific Gravity, Urine: 1.01 (ref 1.005–1.030)
Squamous Epithelial / HPF: NONE SEEN (ref 0–5)
pH: 6 (ref 5.0–8.0)

## 2021-06-28 LAB — BASIC METABOLIC PANEL
Anion gap: 4 — ABNORMAL LOW (ref 5–15)
BUN: 24 mg/dL — ABNORMAL HIGH (ref 8–23)
CO2: 28 mmol/L (ref 22–32)
Calcium: 8.4 mg/dL — ABNORMAL LOW (ref 8.9–10.3)
Chloride: 107 mmol/L (ref 98–111)
Creatinine, Ser: 0.84 mg/dL (ref 0.44–1.00)
GFR, Estimated: >60 mL/min (ref >60)
Glucose, Bld: 102 mg/dL — ABNORMAL HIGH (ref 70–99)
Potassium: 4.3 mmol/L (ref 3.5–5.1)
Sodium: 139 mmol/L (ref 135–145)

## 2021-06-28 NOTE — ED Provider Notes (Addendum)
? ?[facility] ?Provider Note ? ? ? Event Date/Time  ? First MD Initiated Contact with Patient 06/27/21 2341   ?  (approximate) ? ? ?History  ? ?Fall ? ? ?HPI ? ?Melissa [name] is a 88 y.o. female who presents to the ED for evaluation of Fall ?  ?I reviewed DC summary from 5/5.  History of cognitive decline, GERD.  Was admitted for a UTI and associated metabolic encephalopathy, treated with Rocephin and discharged with ciprofloxacin. ? ?She presents to the ED by EMS from her SNF for evaluation of an unwitnessed fall.  She apparently stood up to go to the restroom, her leg gave out or she tripped, causing her to fall and strike the right side of her head on nearby furniture. ? ?Complaining of right shoulder pain primarily.  Denies any syncope, chest pain, abdominal pain or dyspnea. ? ?Physical Exam  ? ?Triage Vital Signs: ?ED Triage Vitals  ?Enc Vitals Group  ?   BP 06/27/21 2347 140/64  ?   Pulse Rate 06/27/21 2339 82  ?   Resp 06/27/21 2339 11  ?   Temp 06/27/21 2339 98 ?F (36.7 ?C)  ?   Temp Source 06/27/21 2339 Oral  ?   SpO2 06/27/21 2339 96 %  ?   Weight 06/27/21 2347 114 lb 10.2 oz (52 kg)  ?   Height 06/27/21 2347 '5\' 5"'$  (1.651 m)  ?   Head Circumference --   ?   Peak Flow --   ?   Pain Score 06/27/21 2347 7  ?   Pain Loc --   ?   Pain Edu? --   ?   Excl. in [place]? --   ? ? ?Most recent vital signs: ?Vitals:  ? 06/28/21 0100 06/28/21 0130  ?BP: 139/67 (!) 132/59  ?Pulse: 73 77  ?Resp: 20 20  ?Temp:    ?SpO2: 99% 98%  ? ? ?General: Awake, no distress.  ?CV:  Good peripheral perfusion.  ?Resp:  Normal effort.  ?Abd:  No distention.  ?MSK:  Tenderness to the right clavicle without skin tenting or laceration.  Right arm is distally neurovascularly intact.  The skin tear to the dorsum of the right forearm without repairable laceration. ?1.5 cm obliquely oriented laceration into the subcutaneous tissue of the right temple.  No surrounding bony step-offs ?Second laceration is about 3 cm in length,  similarly obliquely oriented and posterior to the previous 1.  The hairline over the right parietal skull. ?Neuro:  No focal deficits appreciated. ?Other:   ? ? ?ED Results / Procedures / Treatments  ? ?Labs ?(all labs ordered are listed, but only abnormal results are displayed) ?Labs Reviewed  ?URINALYSIS, ROUTINE W REFLEX MICROSCOPIC - Abnormal; Notable for the following components:  ?    Result Value  ? Color, Urine STRAW (*)   ? APPearance CLEAR (*)   ? Hgb urine dipstick SMALL (*)   ? All other components within normal limits  ?CBC WITH DIFFERENTIAL/PLATELET - Abnormal; Notable for the following components:  ? WBC 11.4 (*)   ? RBC 2.70 (*)   ? Hemoglobin 8.6 (*)   ? HCT 26.8 (*)   ? RDW 18.8 (*)   ? Platelets 460 (*)   ? nRBC 0.3 (*)   ? Neutro Abs 8.2 (*)   ? Monocytes Absolute 1.1 (*)   ? Abs Immature Granulocytes 0.08 (*)   ? All other components within normal limits  ?BASIC METABOLIC PANEL -  Abnormal; Notable for the following components:  ? Glucose, Bld 102 (*)   ? BUN 24 (*)   ? Calcium 8.4 (*)   ? Anion gap 4 (*)   ? All other components within normal limits  ? ? ?EKG ? ? ?RADIOLOGY ?CT head reviewed by me without evidence of acute intracranial pathology ?Plain film of the right shoulder interpreted by me with right clavicle fracture ? ?Official radiology report(s): ?DG Shoulder Right ? ?Result Date: 06/28/2021 ?CLINICAL DATA:  Fall, right shoulder pain EXAM: RIGHT SHOULDER - 2+ VIEW COMPARISON:  10/17/2019 FINDINGS: Surgical changes of right reverse total shoulder arthroplasty are identified. There is an acute fracture of the distal right clavicle just proximal to the acromioclavicular articulation with apex superior angulation and mild override of the fracture fragments. There is, additionally, lucency and cortical irregularity involving the medial right clavicle which reflects an additional transverse extra-articular mildly displaced fracture in this location. Visualized scapula and proximal humerus  appears intact. Limited evaluation of the right hemithorax is unremarkable. IMPRESSION: Acute fractures of the medial right clavicle, demonstrating mild displacement, and distal right clavicle just proximal to the acromioclavicular junction demonstrating apex superior angulation and mild override. Electronically Signed   By: Fidela Salisbury M.D.   On: 06/28/2021 00:28  ? ?CT HEAD WO CONTRAST (5MM) ? ?Result Date: 06/28/2021 ?CLINICAL DATA:  Trauma. EXAM: CT HEAD WITHOUT CONTRAST CT CERVICAL SPINE WITHOUT CONTRAST TECHNIQUE: Multidetector CT imaging of the head and cervical spine was performed following the standard protocol without intravenous contrast. Multiplanar CT image reconstructions of the cervical spine were also generated. RADIATION DOSE REDUCTION: This exam was performed according to the departmental dose-optimization program which includes automated exposure control, adjustment of the mA and/or kV according to patient size and/or use of iterative reconstruction technique. COMPARISON:  Head CT dated 06/21/2021. FINDINGS: CT HEAD FINDINGS Brain: Mild age-related atrophy and chronic microvascular ischemic changes. An area of old infarct and encephalomalacia involving the right occipital lobe. There is dilatation of the ventricles out of proportion with the sulci which may represent central volume loss versus normal pressure hydrocephalus. Clinical correlation is recommended. There is no acute intracranial hemorrhage. No mass effect midline shift. No extra-axial fluid collection. Vascular: No hyperdense vessel or unexpected calcification. Skull: Normal. Negative for fracture or focal lesion. Sinuses/Orbits: No acute finding. Other: Laceration of the scalp over the right parietal calvarium. CT CERVICAL SPINE FINDINGS Alignment: No acute subluxation. Skull base and vertebrae: No acute fracture of the cervical spine. Soft tissues and spinal canal: No prevertebral fluid or swelling. No visible canal hematoma. Disc  levels:  Multilevel degenerative changes. Upper chest: Mildly displaced fracture of the right clavicle. This is only partially visualized on this CT. Other: Bilateral carotid bulb calcified plaques. IMPRESSION: 1. No acute intracranial pathology. Age-related atrophy and chronic microvascular ischemic change. 2. No acute fracture or subluxation of the cervical spine. 3. Partially visualized right clavicular fracture. Electronically Signed   By: Anner Crete M.D.   On: 06/28/2021 00:43  ? ?CT Cervical Spine Wo Contrast ? ?Result Date: 06/28/2021 ?CLINICAL DATA:  Trauma. EXAM: CT HEAD WITHOUT CONTRAST CT CERVICAL SPINE WITHOUT CONTRAST TECHNIQUE: Multidetector CT imaging of the head and cervical spine was performed following the standard protocol without intravenous contrast. Multiplanar CT image reconstructions of the cervical spine were also generated. RADIATION DOSE REDUCTION: This exam was performed according to the departmental dose-optimization program which includes automated exposure control, adjustment of the mA and/or kV according to patient size and/or use of  iterative reconstruction technique. COMPARISON:  Head CT dated 06/21/2021. FINDINGS: CT HEAD FINDINGS Brain: Mild age-related atrophy and chronic microvascular ischemic changes. An area of old infarct and encephalomalacia involving the right occipital lobe. There is dilatation of the ventricles out of proportion with the sulci which may represent central volume loss versus normal pressure hydrocephalus. Clinical correlation is recommended. There is no acute intracranial hemorrhage. No mass effect midline shift. No extra-axial fluid collection. Vascular: No hyperdense vessel or unexpected calcification. Skull: Normal. Negative for fracture or focal lesion. Sinuses/Orbits: No acute finding. Other: Laceration of the scalp over the right parietal calvarium. CT CERVICAL SPINE FINDINGS Alignment: No acute subluxation. Skull base and vertebrae: No acute  fracture of the cervical spine. Soft tissues and spinal canal: No prevertebral fluid or swelling. No visible canal hematoma. Disc levels:  Multilevel degenerative changes. Upper chest: Mildly displaced fra

## 2021-06-28 NOTE — ED Notes (Signed)
Pts arm laceration on right arm wrapped up at this time ?

## 2021-06-28 NOTE — ED Notes (Signed)
Called EMS for transport back to [facility] ?

## 2021-06-28 NOTE — Discharge Instructions (Signed)
Micaela had a small laceration to her right temple that has been repaired with a adhesive derma clip and small amount of tissue adhesive/superglue.  Leave this adhesive bandage on for the next 1 week before removing. ? ?No signs of UTI, continue any remaining ciprofloxacin dosing that she may have left. ? ?She broke her right collarbone.  Please keep the right arm in a sling for comfort and support.  She may take it out of the sling as tolerated for range of motion exercises, changing clothes and bathing. ?

## 2021-06-29 DIAGNOSIS — Z993 Dependence on wheelchair: Secondary | ICD-10-CM | POA: Diagnosis not present

## 2021-06-29 DIAGNOSIS — S32010A Wedge compression fracture of first lumbar vertebra, initial encounter for closed fracture: Secondary | ICD-10-CM | POA: Diagnosis not present

## 2021-06-29 DIAGNOSIS — I639 Cerebral infarction, unspecified: Secondary | ICD-10-CM | POA: Diagnosis not present

## 2021-06-29 DIAGNOSIS — I6389 Other cerebral infarction: Secondary | ICD-10-CM | POA: Diagnosis not present

## 2021-06-29 DIAGNOSIS — K219 Gastro-esophageal reflux disease without esophagitis: Secondary | ICD-10-CM | POA: Diagnosis not present

## 2021-06-29 DIAGNOSIS — R519 Headache, unspecified: Secondary | ICD-10-CM | POA: Diagnosis not present

## 2021-06-29 DIAGNOSIS — S42031D Displaced fracture of lateral end of right clavicle, subsequent encounter for fracture with routine healing: Secondary | ICD-10-CM | POA: Diagnosis not present

## 2021-06-29 DIAGNOSIS — D649 Anemia, unspecified: Secondary | ICD-10-CM | POA: Diagnosis not present

## 2021-06-29 DIAGNOSIS — M6281 Muscle weakness (generalized): Secondary | ICD-10-CM | POA: Diagnosis not present

## 2021-06-29 DIAGNOSIS — M810 Age-related osteoporosis without current pathological fracture: Secondary | ICD-10-CM | POA: Diagnosis not present

## 2021-06-29 DIAGNOSIS — R4189 Other symptoms and signs involving cognitive functions and awareness: Secondary | ICD-10-CM | POA: Diagnosis not present

## 2021-06-29 DIAGNOSIS — B950 Streptococcus, group A, as the cause of diseases classified elsewhere: Secondary | ICD-10-CM | POA: Diagnosis not present

## 2021-06-29 DIAGNOSIS — R54 Age-related physical debility: Secondary | ICD-10-CM | POA: Diagnosis present

## 2021-06-29 DIAGNOSIS — F01A18 Vascular dementia, mild, with other behavioral disturbance: Secondary | ICD-10-CM | POA: Diagnosis not present

## 2021-06-29 DIAGNOSIS — R0902 Hypoxemia: Secondary | ICD-10-CM | POA: Diagnosis not present

## 2021-06-29 DIAGNOSIS — G479 Sleep disorder, unspecified: Secondary | ICD-10-CM | POA: Diagnosis not present

## 2021-06-29 DIAGNOSIS — B351 Tinea unguium: Secondary | ICD-10-CM | POA: Diagnosis not present

## 2021-06-29 DIAGNOSIS — E890 Postprocedural hypothyroidism: Secondary | ICD-10-CM | POA: Diagnosis present

## 2021-06-29 DIAGNOSIS — R400 Somnolence: Secondary | ICD-10-CM | POA: Diagnosis not present

## 2021-06-29 DIAGNOSIS — R5381 Other malaise: Secondary | ICD-10-CM | POA: Diagnosis not present

## 2021-06-29 DIAGNOSIS — D509 Iron deficiency anemia, unspecified: Secondary | ICD-10-CM | POA: Diagnosis not present

## 2021-06-29 DIAGNOSIS — R29703 NIHSS score 3: Secondary | ICD-10-CM | POA: Diagnosis present

## 2021-06-29 DIAGNOSIS — R4182 Altered mental status, unspecified: Secondary | ICD-10-CM | POA: Diagnosis not present

## 2021-06-29 DIAGNOSIS — Z66 Do not resuscitate: Secondary | ICD-10-CM | POA: Diagnosis present

## 2021-06-29 DIAGNOSIS — I6529 Occlusion and stenosis of unspecified carotid artery: Secondary | ICD-10-CM | POA: Diagnosis not present

## 2021-06-29 DIAGNOSIS — Z20818 Contact with and (suspected) exposure to other bacterial communicable diseases: Secondary | ICD-10-CM | POA: Diagnosis not present

## 2021-06-29 DIAGNOSIS — Z9181 History of falling: Secondary | ICD-10-CM | POA: Diagnosis not present

## 2021-06-29 DIAGNOSIS — M5416 Radiculopathy, lumbar region: Secondary | ICD-10-CM | POA: Diagnosis not present

## 2021-06-29 DIAGNOSIS — N390 Urinary tract infection, site not specified: Secondary | ICD-10-CM | POA: Diagnosis present

## 2021-06-29 DIAGNOSIS — F0150 Vascular dementia without behavioral disturbance: Secondary | ICD-10-CM | POA: Diagnosis not present

## 2021-06-29 DIAGNOSIS — I10 Essential (primary) hypertension: Secondary | ICD-10-CM | POA: Diagnosis present

## 2021-06-29 DIAGNOSIS — F01518 Vascular dementia, unspecified severity, with other behavioral disturbance: Secondary | ICD-10-CM | POA: Diagnosis not present

## 2021-06-29 DIAGNOSIS — E441 Mild protein-calorie malnutrition: Secondary | ICD-10-CM | POA: Diagnosis not present

## 2021-06-29 DIAGNOSIS — S42001A Fracture of unspecified part of right clavicle, initial encounter for closed fracture: Secondary | ICD-10-CM | POA: Diagnosis not present

## 2021-06-29 DIAGNOSIS — S72141A Displaced intertrochanteric fracture of right femur, initial encounter for closed fracture: Secondary | ICD-10-CM | POA: Diagnosis not present

## 2021-06-29 DIAGNOSIS — Z96611 Presence of right artificial shoulder joint: Secondary | ICD-10-CM | POA: Diagnosis present

## 2021-06-29 DIAGNOSIS — Z23 Encounter for immunization: Secondary | ICD-10-CM | POA: Diagnosis not present

## 2021-06-29 DIAGNOSIS — R2681 Unsteadiness on feet: Secondary | ICD-10-CM | POA: Diagnosis not present

## 2021-06-29 DIAGNOSIS — Z711 Person with feared health complaint in whom no diagnosis is made: Secondary | ICD-10-CM | POA: Diagnosis not present

## 2021-06-29 DIAGNOSIS — K5904 Chronic idiopathic constipation: Secondary | ICD-10-CM | POA: Diagnosis not present

## 2021-06-29 DIAGNOSIS — I7091 Generalized atherosclerosis: Secondary | ICD-10-CM | POA: Diagnosis not present

## 2021-06-29 DIAGNOSIS — Z1611 Resistance to penicillins: Secondary | ICD-10-CM | POA: Diagnosis present

## 2021-06-29 DIAGNOSIS — I700 Atherosclerosis of aorta: Secondary | ICD-10-CM | POA: Diagnosis not present

## 2021-06-29 DIAGNOSIS — M8448XG Pathological fracture, other site, subsequent encounter for fracture with delayed healing: Secondary | ICD-10-CM | POA: Diagnosis not present

## 2021-06-29 DIAGNOSIS — M159 Polyosteoarthritis, unspecified: Secondary | ICD-10-CM | POA: Diagnosis not present

## 2021-06-29 DIAGNOSIS — Z882 Allergy status to sulfonamides status: Secondary | ICD-10-CM | POA: Diagnosis not present

## 2021-06-29 DIAGNOSIS — E43 Unspecified severe protein-calorie malnutrition: Secondary | ICD-10-CM | POA: Diagnosis not present

## 2021-06-29 DIAGNOSIS — Z741 Need for assistance with personal care: Secondary | ICD-10-CM | POA: Diagnosis not present

## 2021-06-29 DIAGNOSIS — I6350 Cerebral infarction due to unspecified occlusion or stenosis of unspecified cerebral artery: Secondary | ICD-10-CM | POA: Diagnosis present

## 2021-06-29 DIAGNOSIS — I5A Non-ischemic myocardial injury (non-traumatic): Secondary | ICD-10-CM | POA: Diagnosis present

## 2021-06-29 DIAGNOSIS — F32A Depression, unspecified: Secondary | ICD-10-CM | POA: Diagnosis present

## 2021-06-29 DIAGNOSIS — R531 Weakness: Secondary | ICD-10-CM | POA: Diagnosis not present

## 2021-06-29 DIAGNOSIS — M48061 Spinal stenosis, lumbar region without neurogenic claudication: Secondary | ICD-10-CM | POA: Diagnosis not present

## 2021-06-29 DIAGNOSIS — B9620 Unspecified Escherichia coli [E. coli] as the cause of diseases classified elsewhere: Secondary | ICD-10-CM | POA: Diagnosis present

## 2021-06-29 DIAGNOSIS — R29898 Other symptoms and signs involving the musculoskeletal system: Secondary | ICD-10-CM | POA: Diagnosis not present

## 2021-06-29 DIAGNOSIS — Z681 Body mass index (BMI) 19 or less, adult: Secondary | ICD-10-CM | POA: Diagnosis not present

## 2021-06-29 DIAGNOSIS — N3000 Acute cystitis without hematuria: Secondary | ICD-10-CM | POA: Diagnosis not present

## 2021-06-29 DIAGNOSIS — M1990 Unspecified osteoarthritis, unspecified site: Secondary | ICD-10-CM | POA: Diagnosis not present

## 2021-06-29 DIAGNOSIS — G8194 Hemiplegia, unspecified affecting left nondominant side: Secondary | ICD-10-CM | POA: Diagnosis present

## 2021-06-29 DIAGNOSIS — R278 Other lack of coordination: Secondary | ICD-10-CM | POA: Diagnosis not present

## 2021-06-29 DIAGNOSIS — N3281 Overactive bladder: Secondary | ICD-10-CM | POA: Diagnosis not present

## 2021-06-29 DIAGNOSIS — Z8249 Family history of ischemic heart disease and other diseases of the circulatory system: Secondary | ICD-10-CM | POA: Diagnosis not present

## 2021-06-29 DIAGNOSIS — Z79899 Other long term (current) drug therapy: Secondary | ICD-10-CM | POA: Diagnosis not present

## 2021-07-03 DIAGNOSIS — G479 Sleep disorder, unspecified: Secondary | ICD-10-CM | POA: Diagnosis not present

## 2021-07-03 DIAGNOSIS — F0150 Vascular dementia without behavioral disturbance: Secondary | ICD-10-CM | POA: Diagnosis not present

## 2021-07-03 DIAGNOSIS — S42001A Fracture of unspecified part of right clavicle, initial encounter for closed fracture: Secondary | ICD-10-CM | POA: Diagnosis not present

## 2021-07-03 DIAGNOSIS — R5381 Other malaise: Secondary | ICD-10-CM | POA: Diagnosis not present

## 2021-07-03 DIAGNOSIS — K219 Gastro-esophageal reflux disease without esophagitis: Secondary | ICD-10-CM | POA: Diagnosis not present

## 2021-07-03 DIAGNOSIS — E441 Mild protein-calorie malnutrition: Secondary | ICD-10-CM | POA: Diagnosis not present

## 2021-07-10 DIAGNOSIS — S42001A Fracture of unspecified part of right clavicle, initial encounter for closed fracture: Secondary | ICD-10-CM | POA: Diagnosis not present

## 2021-08-01 DIAGNOSIS — Z741 Need for assistance with personal care: Secondary | ICD-10-CM | POA: Diagnosis not present

## 2021-08-01 DIAGNOSIS — N390 Urinary tract infection, site not specified: Secondary | ICD-10-CM | POA: Diagnosis not present

## 2021-08-01 DIAGNOSIS — S42031D Displaced fracture of lateral end of right clavicle, subsequent encounter for fracture with routine healing: Secondary | ICD-10-CM | POA: Diagnosis not present

## 2021-08-01 DIAGNOSIS — M810 Age-related osteoporosis without current pathological fracture: Secondary | ICD-10-CM | POA: Diagnosis not present

## 2021-08-01 DIAGNOSIS — M6281 Muscle weakness (generalized): Secondary | ICD-10-CM | POA: Diagnosis not present

## 2021-08-01 DIAGNOSIS — M48061 Spinal stenosis, lumbar region without neurogenic claudication: Secondary | ICD-10-CM | POA: Diagnosis not present

## 2021-08-01 DIAGNOSIS — D509 Iron deficiency anemia, unspecified: Secondary | ICD-10-CM | POA: Diagnosis not present

## 2021-08-01 DIAGNOSIS — R278 Other lack of coordination: Secondary | ICD-10-CM | POA: Diagnosis not present

## 2021-08-01 DIAGNOSIS — F01A18 Vascular dementia, mild, with other behavioral disturbance: Secondary | ICD-10-CM | POA: Diagnosis not present

## 2021-08-01 DIAGNOSIS — R2681 Unsteadiness on feet: Secondary | ICD-10-CM | POA: Diagnosis not present

## 2021-08-01 DIAGNOSIS — R4189 Other symptoms and signs involving cognitive functions and awareness: Secondary | ICD-10-CM | POA: Diagnosis not present

## 2021-08-01 DIAGNOSIS — Z9181 History of falling: Secondary | ICD-10-CM | POA: Diagnosis not present

## 2021-08-01 DIAGNOSIS — F0150 Vascular dementia without behavioral disturbance: Secondary | ICD-10-CM | POA: Diagnosis not present

## 2021-08-01 DIAGNOSIS — M5416 Radiculopathy, lumbar region: Secondary | ICD-10-CM | POA: Diagnosis not present

## 2021-08-02 DIAGNOSIS — N390 Urinary tract infection, site not specified: Secondary | ICD-10-CM | POA: Diagnosis not present

## 2021-08-02 DIAGNOSIS — S42031D Displaced fracture of lateral end of right clavicle, subsequent encounter for fracture with routine healing: Secondary | ICD-10-CM | POA: Diagnosis not present

## 2021-08-02 DIAGNOSIS — Z9181 History of falling: Secondary | ICD-10-CM | POA: Diagnosis not present

## 2021-08-02 DIAGNOSIS — D509 Iron deficiency anemia, unspecified: Secondary | ICD-10-CM | POA: Diagnosis not present

## 2021-08-02 DIAGNOSIS — F01A18 Vascular dementia, mild, with other behavioral disturbance: Secondary | ICD-10-CM | POA: Diagnosis not present

## 2021-08-02 DIAGNOSIS — M810 Age-related osteoporosis without current pathological fracture: Secondary | ICD-10-CM | POA: Diagnosis not present

## 2021-08-04 DIAGNOSIS — N390 Urinary tract infection, site not specified: Secondary | ICD-10-CM | POA: Diagnosis not present

## 2021-08-04 DIAGNOSIS — Z9181 History of falling: Secondary | ICD-10-CM | POA: Diagnosis not present

## 2021-08-04 DIAGNOSIS — M810 Age-related osteoporosis without current pathological fracture: Secondary | ICD-10-CM | POA: Diagnosis not present

## 2021-08-04 DIAGNOSIS — S42031D Displaced fracture of lateral end of right clavicle, subsequent encounter for fracture with routine healing: Secondary | ICD-10-CM | POA: Diagnosis not present

## 2021-08-04 DIAGNOSIS — F01A18 Vascular dementia, mild, with other behavioral disturbance: Secondary | ICD-10-CM | POA: Diagnosis not present

## 2021-08-04 DIAGNOSIS — D509 Iron deficiency anemia, unspecified: Secondary | ICD-10-CM | POA: Diagnosis not present

## 2021-08-08 DIAGNOSIS — F01A18 Vascular dementia, mild, with other behavioral disturbance: Secondary | ICD-10-CM | POA: Diagnosis not present

## 2021-08-08 DIAGNOSIS — Z9181 History of falling: Secondary | ICD-10-CM | POA: Diagnosis not present

## 2021-08-08 DIAGNOSIS — D509 Iron deficiency anemia, unspecified: Secondary | ICD-10-CM | POA: Diagnosis not present

## 2021-08-08 DIAGNOSIS — N390 Urinary tract infection, site not specified: Secondary | ICD-10-CM | POA: Diagnosis not present

## 2021-08-08 DIAGNOSIS — S42031D Displaced fracture of lateral end of right clavicle, subsequent encounter for fracture with routine healing: Secondary | ICD-10-CM | POA: Diagnosis not present

## 2021-08-08 DIAGNOSIS — M810 Age-related osteoporosis without current pathological fracture: Secondary | ICD-10-CM | POA: Diagnosis not present

## 2021-08-10 DIAGNOSIS — N390 Urinary tract infection, site not specified: Secondary | ICD-10-CM | POA: Diagnosis not present

## 2021-08-10 DIAGNOSIS — D509 Iron deficiency anemia, unspecified: Secondary | ICD-10-CM | POA: Diagnosis not present

## 2021-08-10 DIAGNOSIS — S42031D Displaced fracture of lateral end of right clavicle, subsequent encounter for fracture with routine healing: Secondary | ICD-10-CM | POA: Diagnosis not present

## 2021-08-10 DIAGNOSIS — M810 Age-related osteoporosis without current pathological fracture: Secondary | ICD-10-CM | POA: Diagnosis not present

## 2021-08-10 DIAGNOSIS — Z9181 History of falling: Secondary | ICD-10-CM | POA: Diagnosis not present

## 2021-08-10 DIAGNOSIS — F01A18 Vascular dementia, mild, with other behavioral disturbance: Secondary | ICD-10-CM | POA: Diagnosis not present

## 2021-08-14 DIAGNOSIS — N390 Urinary tract infection, site not specified: Secondary | ICD-10-CM | POA: Diagnosis not present

## 2021-08-14 DIAGNOSIS — S42031D Displaced fracture of lateral end of right clavicle, subsequent encounter for fracture with routine healing: Secondary | ICD-10-CM | POA: Diagnosis not present

## 2021-08-14 DIAGNOSIS — F01A18 Vascular dementia, mild, with other behavioral disturbance: Secondary | ICD-10-CM | POA: Diagnosis not present

## 2021-08-14 DIAGNOSIS — M810 Age-related osteoporosis without current pathological fracture: Secondary | ICD-10-CM | POA: Diagnosis not present

## 2021-08-14 DIAGNOSIS — Z9181 History of falling: Secondary | ICD-10-CM | POA: Diagnosis not present

## 2021-08-14 DIAGNOSIS — D509 Iron deficiency anemia, unspecified: Secondary | ICD-10-CM | POA: Diagnosis not present

## 2021-08-18 DIAGNOSIS — Z9181 History of falling: Secondary | ICD-10-CM | POA: Diagnosis not present

## 2021-08-18 DIAGNOSIS — N390 Urinary tract infection, site not specified: Secondary | ICD-10-CM | POA: Diagnosis not present

## 2021-08-18 DIAGNOSIS — D509 Iron deficiency anemia, unspecified: Secondary | ICD-10-CM | POA: Diagnosis not present

## 2021-08-18 DIAGNOSIS — F01A18 Vascular dementia, mild, with other behavioral disturbance: Secondary | ICD-10-CM | POA: Diagnosis not present

## 2021-08-18 DIAGNOSIS — M810 Age-related osteoporosis without current pathological fracture: Secondary | ICD-10-CM | POA: Diagnosis not present

## 2021-08-18 DIAGNOSIS — S42031D Displaced fracture of lateral end of right clavicle, subsequent encounter for fracture with routine healing: Secondary | ICD-10-CM | POA: Diagnosis not present

## 2021-08-19 DIAGNOSIS — M810 Age-related osteoporosis without current pathological fracture: Secondary | ICD-10-CM | POA: Diagnosis not present

## 2021-08-19 DIAGNOSIS — R4189 Other symptoms and signs involving cognitive functions and awareness: Secondary | ICD-10-CM | POA: Diagnosis not present

## 2021-08-19 DIAGNOSIS — F01A18 Vascular dementia, mild, with other behavioral disturbance: Secondary | ICD-10-CM | POA: Diagnosis not present

## 2021-08-19 DIAGNOSIS — M48061 Spinal stenosis, lumbar region without neurogenic claudication: Secondary | ICD-10-CM | POA: Diagnosis not present

## 2021-08-19 DIAGNOSIS — R2681 Unsteadiness on feet: Secondary | ICD-10-CM | POA: Diagnosis not present

## 2021-08-19 DIAGNOSIS — D509 Iron deficiency anemia, unspecified: Secondary | ICD-10-CM | POA: Diagnosis not present

## 2021-08-19 DIAGNOSIS — N390 Urinary tract infection, site not specified: Secondary | ICD-10-CM | POA: Diagnosis not present

## 2021-08-19 DIAGNOSIS — S42031D Displaced fracture of lateral end of right clavicle, subsequent encounter for fracture with routine healing: Secondary | ICD-10-CM | POA: Diagnosis not present

## 2021-08-19 DIAGNOSIS — M5416 Radiculopathy, lumbar region: Secondary | ICD-10-CM | POA: Diagnosis not present

## 2021-08-19 DIAGNOSIS — R278 Other lack of coordination: Secondary | ICD-10-CM | POA: Diagnosis not present

## 2021-08-19 DIAGNOSIS — Z741 Need for assistance with personal care: Secondary | ICD-10-CM | POA: Diagnosis not present

## 2021-08-19 DIAGNOSIS — Z9181 History of falling: Secondary | ICD-10-CM | POA: Diagnosis not present

## 2021-08-19 DIAGNOSIS — M6281 Muscle weakness (generalized): Secondary | ICD-10-CM | POA: Diagnosis not present

## 2021-08-19 DIAGNOSIS — F0150 Vascular dementia without behavioral disturbance: Secondary | ICD-10-CM | POA: Diagnosis not present

## 2021-08-21 DIAGNOSIS — D509 Iron deficiency anemia, unspecified: Secondary | ICD-10-CM | POA: Diagnosis not present

## 2021-08-21 DIAGNOSIS — M810 Age-related osteoporosis without current pathological fracture: Secondary | ICD-10-CM | POA: Diagnosis not present

## 2021-08-21 DIAGNOSIS — Z9181 History of falling: Secondary | ICD-10-CM | POA: Diagnosis not present

## 2021-08-21 DIAGNOSIS — S42031D Displaced fracture of lateral end of right clavicle, subsequent encounter for fracture with routine healing: Secondary | ICD-10-CM | POA: Diagnosis not present

## 2021-08-21 DIAGNOSIS — F01A18 Vascular dementia, mild, with other behavioral disturbance: Secondary | ICD-10-CM | POA: Diagnosis not present

## 2021-08-21 DIAGNOSIS — N390 Urinary tract infection, site not specified: Secondary | ICD-10-CM | POA: Diagnosis not present

## 2021-08-23 DIAGNOSIS — N390 Urinary tract infection, site not specified: Secondary | ICD-10-CM | POA: Diagnosis not present

## 2021-08-23 DIAGNOSIS — D509 Iron deficiency anemia, unspecified: Secondary | ICD-10-CM | POA: Diagnosis not present

## 2021-08-23 DIAGNOSIS — F01A18 Vascular dementia, mild, with other behavioral disturbance: Secondary | ICD-10-CM | POA: Diagnosis not present

## 2021-08-23 DIAGNOSIS — M810 Age-related osteoporosis without current pathological fracture: Secondary | ICD-10-CM | POA: Diagnosis not present

## 2021-08-23 DIAGNOSIS — S42031D Displaced fracture of lateral end of right clavicle, subsequent encounter for fracture with routine healing: Secondary | ICD-10-CM | POA: Diagnosis not present

## 2021-08-23 DIAGNOSIS — Z9181 History of falling: Secondary | ICD-10-CM | POA: Diagnosis not present

## 2021-08-24 DIAGNOSIS — N390 Urinary tract infection, site not specified: Secondary | ICD-10-CM | POA: Diagnosis not present

## 2021-08-24 DIAGNOSIS — F01A18 Vascular dementia, mild, with other behavioral disturbance: Secondary | ICD-10-CM | POA: Diagnosis not present

## 2021-08-24 DIAGNOSIS — D509 Iron deficiency anemia, unspecified: Secondary | ICD-10-CM | POA: Diagnosis not present

## 2021-08-24 DIAGNOSIS — Z9181 History of falling: Secondary | ICD-10-CM | POA: Diagnosis not present

## 2021-08-24 DIAGNOSIS — S42031D Displaced fracture of lateral end of right clavicle, subsequent encounter for fracture with routine healing: Secondary | ICD-10-CM | POA: Diagnosis not present

## 2021-08-24 DIAGNOSIS — M810 Age-related osteoporosis without current pathological fracture: Secondary | ICD-10-CM | POA: Diagnosis not present

## 2021-08-25 DIAGNOSIS — R531 Weakness: Secondary | ICD-10-CM | POA: Diagnosis not present

## 2021-08-25 DIAGNOSIS — S42031D Displaced fracture of lateral end of right clavicle, subsequent encounter for fracture with routine healing: Secondary | ICD-10-CM | POA: Diagnosis not present

## 2021-08-25 DIAGNOSIS — F01A18 Vascular dementia, mild, with other behavioral disturbance: Secondary | ICD-10-CM | POA: Diagnosis not present

## 2021-08-25 DIAGNOSIS — N390 Urinary tract infection, site not specified: Secondary | ICD-10-CM | POA: Diagnosis not present

## 2021-08-25 DIAGNOSIS — M810 Age-related osteoporosis without current pathological fracture: Secondary | ICD-10-CM | POA: Diagnosis not present

## 2021-08-25 DIAGNOSIS — M5416 Radiculopathy, lumbar region: Secondary | ICD-10-CM | POA: Diagnosis not present

## 2021-08-25 DIAGNOSIS — K219 Gastro-esophageal reflux disease without esophagitis: Secondary | ICD-10-CM | POA: Diagnosis not present

## 2021-08-25 DIAGNOSIS — N3281 Overactive bladder: Secondary | ICD-10-CM | POA: Diagnosis not present

## 2021-08-25 DIAGNOSIS — F0150 Vascular dementia without behavioral disturbance: Secondary | ICD-10-CM | POA: Diagnosis not present

## 2021-08-25 DIAGNOSIS — D509 Iron deficiency anemia, unspecified: Secondary | ICD-10-CM | POA: Diagnosis not present

## 2021-08-25 DIAGNOSIS — M1990 Unspecified osteoarthritis, unspecified site: Secondary | ICD-10-CM | POA: Diagnosis not present

## 2021-08-25 DIAGNOSIS — E43 Unspecified severe protein-calorie malnutrition: Secondary | ICD-10-CM | POA: Diagnosis not present

## 2021-08-25 DIAGNOSIS — Z9181 History of falling: Secondary | ICD-10-CM | POA: Diagnosis not present

## 2021-08-26 DIAGNOSIS — D509 Iron deficiency anemia, unspecified: Secondary | ICD-10-CM | POA: Diagnosis not present

## 2021-08-26 DIAGNOSIS — M810 Age-related osteoporosis without current pathological fracture: Secondary | ICD-10-CM | POA: Diagnosis not present

## 2021-08-26 DIAGNOSIS — S42031D Displaced fracture of lateral end of right clavicle, subsequent encounter for fracture with routine healing: Secondary | ICD-10-CM | POA: Diagnosis not present

## 2021-08-26 DIAGNOSIS — Z9181 History of falling: Secondary | ICD-10-CM | POA: Diagnosis not present

## 2021-08-26 DIAGNOSIS — F01A18 Vascular dementia, mild, with other behavioral disturbance: Secondary | ICD-10-CM | POA: Diagnosis not present

## 2021-08-26 DIAGNOSIS — N390 Urinary tract infection, site not specified: Secondary | ICD-10-CM | POA: Diagnosis not present

## 2021-08-28 DIAGNOSIS — N390 Urinary tract infection, site not specified: Secondary | ICD-10-CM | POA: Diagnosis not present

## 2021-08-28 DIAGNOSIS — D509 Iron deficiency anemia, unspecified: Secondary | ICD-10-CM | POA: Diagnosis not present

## 2021-08-28 DIAGNOSIS — Z9181 History of falling: Secondary | ICD-10-CM | POA: Diagnosis not present

## 2021-08-28 DIAGNOSIS — M810 Age-related osteoporosis without current pathological fracture: Secondary | ICD-10-CM | POA: Diagnosis not present

## 2021-08-28 DIAGNOSIS — F01A18 Vascular dementia, mild, with other behavioral disturbance: Secondary | ICD-10-CM | POA: Diagnosis not present

## 2021-08-28 DIAGNOSIS — S42031D Displaced fracture of lateral end of right clavicle, subsequent encounter for fracture with routine healing: Secondary | ICD-10-CM | POA: Diagnosis not present

## 2021-08-29 DIAGNOSIS — Z9181 History of falling: Secondary | ICD-10-CM | POA: Diagnosis not present

## 2021-08-29 DIAGNOSIS — N390 Urinary tract infection, site not specified: Secondary | ICD-10-CM | POA: Diagnosis not present

## 2021-08-29 DIAGNOSIS — F01A18 Vascular dementia, mild, with other behavioral disturbance: Secondary | ICD-10-CM | POA: Diagnosis not present

## 2021-08-29 DIAGNOSIS — M810 Age-related osteoporosis without current pathological fracture: Secondary | ICD-10-CM | POA: Diagnosis not present

## 2021-08-29 DIAGNOSIS — D509 Iron deficiency anemia, unspecified: Secondary | ICD-10-CM | POA: Diagnosis not present

## 2021-08-29 DIAGNOSIS — S42031D Displaced fracture of lateral end of right clavicle, subsequent encounter for fracture with routine healing: Secondary | ICD-10-CM | POA: Diagnosis not present

## 2021-08-31 DIAGNOSIS — M810 Age-related osteoporosis without current pathological fracture: Secondary | ICD-10-CM | POA: Diagnosis not present

## 2021-08-31 DIAGNOSIS — Z9181 History of falling: Secondary | ICD-10-CM | POA: Diagnosis not present

## 2021-08-31 DIAGNOSIS — F01A18 Vascular dementia, mild, with other behavioral disturbance: Secondary | ICD-10-CM | POA: Diagnosis not present

## 2021-08-31 DIAGNOSIS — D509 Iron deficiency anemia, unspecified: Secondary | ICD-10-CM | POA: Diagnosis not present

## 2021-08-31 DIAGNOSIS — S42031D Displaced fracture of lateral end of right clavicle, subsequent encounter for fracture with routine healing: Secondary | ICD-10-CM | POA: Diagnosis not present

## 2021-08-31 DIAGNOSIS — N390 Urinary tract infection, site not specified: Secondary | ICD-10-CM | POA: Diagnosis not present

## 2021-09-01 DIAGNOSIS — S42031D Displaced fracture of lateral end of right clavicle, subsequent encounter for fracture with routine healing: Secondary | ICD-10-CM | POA: Diagnosis not present

## 2021-09-01 DIAGNOSIS — F01A18 Vascular dementia, mild, with other behavioral disturbance: Secondary | ICD-10-CM | POA: Diagnosis not present

## 2021-09-01 DIAGNOSIS — N390 Urinary tract infection, site not specified: Secondary | ICD-10-CM | POA: Diagnosis not present

## 2021-09-01 DIAGNOSIS — M810 Age-related osteoporosis without current pathological fracture: Secondary | ICD-10-CM | POA: Diagnosis not present

## 2021-09-01 DIAGNOSIS — Z9181 History of falling: Secondary | ICD-10-CM | POA: Diagnosis not present

## 2021-09-01 DIAGNOSIS — D509 Iron deficiency anemia, unspecified: Secondary | ICD-10-CM | POA: Diagnosis not present

## 2021-09-02 DIAGNOSIS — D509 Iron deficiency anemia, unspecified: Secondary | ICD-10-CM | POA: Diagnosis not present

## 2021-09-02 DIAGNOSIS — M810 Age-related osteoporosis without current pathological fracture: Secondary | ICD-10-CM | POA: Diagnosis not present

## 2021-09-02 DIAGNOSIS — N390 Urinary tract infection, site not specified: Secondary | ICD-10-CM | POA: Diagnosis not present

## 2021-09-02 DIAGNOSIS — Z9181 History of falling: Secondary | ICD-10-CM | POA: Diagnosis not present

## 2021-09-02 DIAGNOSIS — S42031D Displaced fracture of lateral end of right clavicle, subsequent encounter for fracture with routine healing: Secondary | ICD-10-CM | POA: Diagnosis not present

## 2021-09-02 DIAGNOSIS — F01A18 Vascular dementia, mild, with other behavioral disturbance: Secondary | ICD-10-CM | POA: Diagnosis not present

## 2021-09-04 DIAGNOSIS — F01A18 Vascular dementia, mild, with other behavioral disturbance: Secondary | ICD-10-CM | POA: Diagnosis not present

## 2021-09-04 DIAGNOSIS — S42031D Displaced fracture of lateral end of right clavicle, subsequent encounter for fracture with routine healing: Secondary | ICD-10-CM | POA: Diagnosis not present

## 2021-09-04 DIAGNOSIS — M810 Age-related osteoporosis without current pathological fracture: Secondary | ICD-10-CM | POA: Diagnosis not present

## 2021-09-04 DIAGNOSIS — N390 Urinary tract infection, site not specified: Secondary | ICD-10-CM | POA: Diagnosis not present

## 2021-09-04 DIAGNOSIS — D509 Iron deficiency anemia, unspecified: Secondary | ICD-10-CM | POA: Diagnosis not present

## 2021-09-04 DIAGNOSIS — Z9181 History of falling: Secondary | ICD-10-CM | POA: Diagnosis not present

## 2021-09-05 DIAGNOSIS — D509 Iron deficiency anemia, unspecified: Secondary | ICD-10-CM | POA: Diagnosis not present

## 2021-09-05 DIAGNOSIS — Z9181 History of falling: Secondary | ICD-10-CM | POA: Diagnosis not present

## 2021-09-05 DIAGNOSIS — S42031D Displaced fracture of lateral end of right clavicle, subsequent encounter for fracture with routine healing: Secondary | ICD-10-CM | POA: Diagnosis not present

## 2021-09-05 DIAGNOSIS — F01A18 Vascular dementia, mild, with other behavioral disturbance: Secondary | ICD-10-CM | POA: Diagnosis not present

## 2021-09-05 DIAGNOSIS — N390 Urinary tract infection, site not specified: Secondary | ICD-10-CM | POA: Diagnosis not present

## 2021-09-05 DIAGNOSIS — M810 Age-related osteoporosis without current pathological fracture: Secondary | ICD-10-CM | POA: Diagnosis not present

## 2021-09-06 DIAGNOSIS — F01A18 Vascular dementia, mild, with other behavioral disturbance: Secondary | ICD-10-CM | POA: Diagnosis not present

## 2021-09-06 DIAGNOSIS — N390 Urinary tract infection, site not specified: Secondary | ICD-10-CM | POA: Diagnosis not present

## 2021-09-06 DIAGNOSIS — Z9181 History of falling: Secondary | ICD-10-CM | POA: Diagnosis not present

## 2021-09-06 DIAGNOSIS — M810 Age-related osteoporosis without current pathological fracture: Secondary | ICD-10-CM | POA: Diagnosis not present

## 2021-09-06 DIAGNOSIS — D509 Iron deficiency anemia, unspecified: Secondary | ICD-10-CM | POA: Diagnosis not present

## 2021-09-06 DIAGNOSIS — S42031D Displaced fracture of lateral end of right clavicle, subsequent encounter for fracture with routine healing: Secondary | ICD-10-CM | POA: Diagnosis not present

## 2021-09-07 DIAGNOSIS — F01A18 Vascular dementia, mild, with other behavioral disturbance: Secondary | ICD-10-CM | POA: Diagnosis not present

## 2021-09-07 DIAGNOSIS — M810 Age-related osteoporosis without current pathological fracture: Secondary | ICD-10-CM | POA: Diagnosis not present

## 2021-09-07 DIAGNOSIS — N390 Urinary tract infection, site not specified: Secondary | ICD-10-CM | POA: Diagnosis not present

## 2021-09-07 DIAGNOSIS — S42031D Displaced fracture of lateral end of right clavicle, subsequent encounter for fracture with routine healing: Secondary | ICD-10-CM | POA: Diagnosis not present

## 2021-09-07 DIAGNOSIS — Z9181 History of falling: Secondary | ICD-10-CM | POA: Diagnosis not present

## 2021-09-07 DIAGNOSIS — D509 Iron deficiency anemia, unspecified: Secondary | ICD-10-CM | POA: Diagnosis not present

## 2021-09-09 DIAGNOSIS — D509 Iron deficiency anemia, unspecified: Secondary | ICD-10-CM | POA: Diagnosis not present

## 2021-09-09 DIAGNOSIS — N390 Urinary tract infection, site not specified: Secondary | ICD-10-CM | POA: Diagnosis not present

## 2021-09-09 DIAGNOSIS — S42031D Displaced fracture of lateral end of right clavicle, subsequent encounter for fracture with routine healing: Secondary | ICD-10-CM | POA: Diagnosis not present

## 2021-09-09 DIAGNOSIS — M810 Age-related osteoporosis without current pathological fracture: Secondary | ICD-10-CM | POA: Diagnosis not present

## 2021-09-09 DIAGNOSIS — Z9181 History of falling: Secondary | ICD-10-CM | POA: Diagnosis not present

## 2021-09-09 DIAGNOSIS — F01A18 Vascular dementia, mild, with other behavioral disturbance: Secondary | ICD-10-CM | POA: Diagnosis not present

## 2021-09-11 DIAGNOSIS — M810 Age-related osteoporosis without current pathological fracture: Secondary | ICD-10-CM | POA: Diagnosis not present

## 2021-09-11 DIAGNOSIS — F01A18 Vascular dementia, mild, with other behavioral disturbance: Secondary | ICD-10-CM | POA: Diagnosis not present

## 2021-09-11 DIAGNOSIS — Z9181 History of falling: Secondary | ICD-10-CM | POA: Diagnosis not present

## 2021-09-11 DIAGNOSIS — N390 Urinary tract infection, site not specified: Secondary | ICD-10-CM | POA: Diagnosis not present

## 2021-09-11 DIAGNOSIS — D509 Iron deficiency anemia, unspecified: Secondary | ICD-10-CM | POA: Diagnosis not present

## 2021-09-11 DIAGNOSIS — S42031D Displaced fracture of lateral end of right clavicle, subsequent encounter for fracture with routine healing: Secondary | ICD-10-CM | POA: Diagnosis not present

## 2021-09-13 DIAGNOSIS — S42031D Displaced fracture of lateral end of right clavicle, subsequent encounter for fracture with routine healing: Secondary | ICD-10-CM | POA: Diagnosis not present

## 2021-09-13 DIAGNOSIS — Z9181 History of falling: Secondary | ICD-10-CM | POA: Diagnosis not present

## 2021-09-13 DIAGNOSIS — D509 Iron deficiency anemia, unspecified: Secondary | ICD-10-CM | POA: Diagnosis not present

## 2021-09-13 DIAGNOSIS — F01A18 Vascular dementia, mild, with other behavioral disturbance: Secondary | ICD-10-CM | POA: Diagnosis not present

## 2021-09-13 DIAGNOSIS — N390 Urinary tract infection, site not specified: Secondary | ICD-10-CM | POA: Diagnosis not present

## 2021-09-13 DIAGNOSIS — M810 Age-related osteoporosis without current pathological fracture: Secondary | ICD-10-CM | POA: Diagnosis not present

## 2021-09-16 DIAGNOSIS — M810 Age-related osteoporosis without current pathological fracture: Secondary | ICD-10-CM | POA: Diagnosis not present

## 2021-09-16 DIAGNOSIS — Z9181 History of falling: Secondary | ICD-10-CM | POA: Diagnosis not present

## 2021-09-16 DIAGNOSIS — N390 Urinary tract infection, site not specified: Secondary | ICD-10-CM | POA: Diagnosis not present

## 2021-09-16 DIAGNOSIS — S42031D Displaced fracture of lateral end of right clavicle, subsequent encounter for fracture with routine healing: Secondary | ICD-10-CM | POA: Diagnosis not present

## 2021-09-16 DIAGNOSIS — F01A18 Vascular dementia, mild, with other behavioral disturbance: Secondary | ICD-10-CM | POA: Diagnosis not present

## 2021-09-16 DIAGNOSIS — D509 Iron deficiency anemia, unspecified: Secondary | ICD-10-CM | POA: Diagnosis not present

## 2021-09-18 DIAGNOSIS — Z9181 History of falling: Secondary | ICD-10-CM | POA: Diagnosis not present

## 2021-09-18 DIAGNOSIS — F01A18 Vascular dementia, mild, with other behavioral disturbance: Secondary | ICD-10-CM | POA: Diagnosis not present

## 2021-09-18 DIAGNOSIS — S42031D Displaced fracture of lateral end of right clavicle, subsequent encounter for fracture with routine healing: Secondary | ICD-10-CM | POA: Diagnosis not present

## 2021-09-18 DIAGNOSIS — N390 Urinary tract infection, site not specified: Secondary | ICD-10-CM | POA: Diagnosis not present

## 2021-09-18 DIAGNOSIS — M810 Age-related osteoporosis without current pathological fracture: Secondary | ICD-10-CM | POA: Diagnosis not present

## 2021-09-18 DIAGNOSIS — D509 Iron deficiency anemia, unspecified: Secondary | ICD-10-CM | POA: Diagnosis not present

## 2021-09-20 DIAGNOSIS — F0150 Vascular dementia without behavioral disturbance: Secondary | ICD-10-CM | POA: Diagnosis not present

## 2021-09-20 DIAGNOSIS — Z741 Need for assistance with personal care: Secondary | ICD-10-CM | POA: Diagnosis not present

## 2021-09-20 DIAGNOSIS — F01A18 Vascular dementia, mild, with other behavioral disturbance: Secondary | ICD-10-CM | POA: Diagnosis not present

## 2021-09-20 DIAGNOSIS — R2681 Unsteadiness on feet: Secondary | ICD-10-CM | POA: Diagnosis not present

## 2021-09-20 DIAGNOSIS — R278 Other lack of coordination: Secondary | ICD-10-CM | POA: Diagnosis not present

## 2021-09-20 DIAGNOSIS — N390 Urinary tract infection, site not specified: Secondary | ICD-10-CM | POA: Diagnosis not present

## 2021-09-20 DIAGNOSIS — M810 Age-related osteoporosis without current pathological fracture: Secondary | ICD-10-CM | POA: Diagnosis not present

## 2021-09-20 DIAGNOSIS — S42031D Displaced fracture of lateral end of right clavicle, subsequent encounter for fracture with routine healing: Secondary | ICD-10-CM | POA: Diagnosis not present

## 2021-09-20 DIAGNOSIS — Z9181 History of falling: Secondary | ICD-10-CM | POA: Diagnosis not present

## 2021-09-20 DIAGNOSIS — D509 Iron deficiency anemia, unspecified: Secondary | ICD-10-CM | POA: Diagnosis not present

## 2021-09-20 DIAGNOSIS — M48061 Spinal stenosis, lumbar region without neurogenic claudication: Secondary | ICD-10-CM | POA: Diagnosis not present

## 2021-09-20 DIAGNOSIS — R4189 Other symptoms and signs involving cognitive functions and awareness: Secondary | ICD-10-CM | POA: Diagnosis not present

## 2021-09-20 DIAGNOSIS — M5416 Radiculopathy, lumbar region: Secondary | ICD-10-CM | POA: Diagnosis not present

## 2021-09-20 DIAGNOSIS — M6281 Muscle weakness (generalized): Secondary | ICD-10-CM | POA: Diagnosis not present

## 2021-09-21 DIAGNOSIS — N390 Urinary tract infection, site not specified: Secondary | ICD-10-CM | POA: Diagnosis not present

## 2021-09-21 DIAGNOSIS — M810 Age-related osteoporosis without current pathological fracture: Secondary | ICD-10-CM | POA: Diagnosis not present

## 2021-09-21 DIAGNOSIS — M159 Polyosteoarthritis, unspecified: Secondary | ICD-10-CM | POA: Diagnosis not present

## 2021-09-21 DIAGNOSIS — D509 Iron deficiency anemia, unspecified: Secondary | ICD-10-CM | POA: Diagnosis not present

## 2021-09-21 DIAGNOSIS — F0150 Vascular dementia without behavioral disturbance: Secondary | ICD-10-CM | POA: Diagnosis not present

## 2021-09-21 DIAGNOSIS — F01A18 Vascular dementia, mild, with other behavioral disturbance: Secondary | ICD-10-CM | POA: Diagnosis not present

## 2021-09-21 DIAGNOSIS — E441 Mild protein-calorie malnutrition: Secondary | ICD-10-CM | POA: Diagnosis not present

## 2021-09-21 DIAGNOSIS — S42031D Displaced fracture of lateral end of right clavicle, subsequent encounter for fracture with routine healing: Secondary | ICD-10-CM | POA: Diagnosis not present

## 2021-09-21 DIAGNOSIS — N3281 Overactive bladder: Secondary | ICD-10-CM | POA: Diagnosis not present

## 2021-09-21 DIAGNOSIS — Z9181 History of falling: Secondary | ICD-10-CM | POA: Diagnosis not present

## 2021-09-23 DIAGNOSIS — Z9181 History of falling: Secondary | ICD-10-CM | POA: Diagnosis not present

## 2021-09-23 DIAGNOSIS — M810 Age-related osteoporosis without current pathological fracture: Secondary | ICD-10-CM | POA: Diagnosis not present

## 2021-09-23 DIAGNOSIS — N390 Urinary tract infection, site not specified: Secondary | ICD-10-CM | POA: Diagnosis not present

## 2021-09-23 DIAGNOSIS — D509 Iron deficiency anemia, unspecified: Secondary | ICD-10-CM | POA: Diagnosis not present

## 2021-09-23 DIAGNOSIS — F01A18 Vascular dementia, mild, with other behavioral disturbance: Secondary | ICD-10-CM | POA: Diagnosis not present

## 2021-09-23 DIAGNOSIS — S42031D Displaced fracture of lateral end of right clavicle, subsequent encounter for fracture with routine healing: Secondary | ICD-10-CM | POA: Diagnosis not present

## 2021-09-25 DIAGNOSIS — S42031D Displaced fracture of lateral end of right clavicle, subsequent encounter for fracture with routine healing: Secondary | ICD-10-CM | POA: Diagnosis not present

## 2021-09-25 DIAGNOSIS — F01A18 Vascular dementia, mild, with other behavioral disturbance: Secondary | ICD-10-CM | POA: Diagnosis not present

## 2021-09-25 DIAGNOSIS — D509 Iron deficiency anemia, unspecified: Secondary | ICD-10-CM | POA: Diagnosis not present

## 2021-09-25 DIAGNOSIS — N390 Urinary tract infection, site not specified: Secondary | ICD-10-CM | POA: Diagnosis not present

## 2021-09-25 DIAGNOSIS — Z9181 History of falling: Secondary | ICD-10-CM | POA: Diagnosis not present

## 2021-09-25 DIAGNOSIS — M810 Age-related osteoporosis without current pathological fracture: Secondary | ICD-10-CM | POA: Diagnosis not present

## 2021-09-26 DIAGNOSIS — D509 Iron deficiency anemia, unspecified: Secondary | ICD-10-CM | POA: Diagnosis not present

## 2021-09-26 DIAGNOSIS — S42031D Displaced fracture of lateral end of right clavicle, subsequent encounter for fracture with routine healing: Secondary | ICD-10-CM | POA: Diagnosis not present

## 2021-09-26 DIAGNOSIS — N390 Urinary tract infection, site not specified: Secondary | ICD-10-CM | POA: Diagnosis not present

## 2021-09-26 DIAGNOSIS — F01A18 Vascular dementia, mild, with other behavioral disturbance: Secondary | ICD-10-CM | POA: Diagnosis not present

## 2021-09-26 DIAGNOSIS — Z9181 History of falling: Secondary | ICD-10-CM | POA: Diagnosis not present

## 2021-09-26 DIAGNOSIS — M810 Age-related osteoporosis without current pathological fracture: Secondary | ICD-10-CM | POA: Diagnosis not present

## 2021-09-27 DIAGNOSIS — S42031D Displaced fracture of lateral end of right clavicle, subsequent encounter for fracture with routine healing: Secondary | ICD-10-CM | POA: Diagnosis not present

## 2021-09-27 DIAGNOSIS — Z9181 History of falling: Secondary | ICD-10-CM | POA: Diagnosis not present

## 2021-09-27 DIAGNOSIS — F01A18 Vascular dementia, mild, with other behavioral disturbance: Secondary | ICD-10-CM | POA: Diagnosis not present

## 2021-09-27 DIAGNOSIS — M810 Age-related osteoporosis without current pathological fracture: Secondary | ICD-10-CM | POA: Diagnosis not present

## 2021-09-27 DIAGNOSIS — N390 Urinary tract infection, site not specified: Secondary | ICD-10-CM | POA: Diagnosis not present

## 2021-09-27 DIAGNOSIS — D509 Iron deficiency anemia, unspecified: Secondary | ICD-10-CM | POA: Diagnosis not present

## 2021-09-28 DIAGNOSIS — Z9181 History of falling: Secondary | ICD-10-CM | POA: Diagnosis not present

## 2021-09-28 DIAGNOSIS — N390 Urinary tract infection, site not specified: Secondary | ICD-10-CM | POA: Diagnosis not present

## 2021-09-28 DIAGNOSIS — M810 Age-related osteoporosis without current pathological fracture: Secondary | ICD-10-CM | POA: Diagnosis not present

## 2021-09-28 DIAGNOSIS — F01A18 Vascular dementia, mild, with other behavioral disturbance: Secondary | ICD-10-CM | POA: Diagnosis not present

## 2021-09-28 DIAGNOSIS — S42031D Displaced fracture of lateral end of right clavicle, subsequent encounter for fracture with routine healing: Secondary | ICD-10-CM | POA: Diagnosis not present

## 2021-09-28 DIAGNOSIS — D509 Iron deficiency anemia, unspecified: Secondary | ICD-10-CM | POA: Diagnosis not present

## 2021-09-29 DIAGNOSIS — N390 Urinary tract infection, site not specified: Secondary | ICD-10-CM | POA: Diagnosis not present

## 2021-09-29 DIAGNOSIS — M810 Age-related osteoporosis without current pathological fracture: Secondary | ICD-10-CM | POA: Diagnosis not present

## 2021-09-29 DIAGNOSIS — D509 Iron deficiency anemia, unspecified: Secondary | ICD-10-CM | POA: Diagnosis not present

## 2021-09-29 DIAGNOSIS — Z9181 History of falling: Secondary | ICD-10-CM | POA: Diagnosis not present

## 2021-09-29 DIAGNOSIS — S42031D Displaced fracture of lateral end of right clavicle, subsequent encounter for fracture with routine healing: Secondary | ICD-10-CM | POA: Diagnosis not present

## 2021-09-29 DIAGNOSIS — F01A18 Vascular dementia, mild, with other behavioral disturbance: Secondary | ICD-10-CM | POA: Diagnosis not present

## 2021-09-30 DIAGNOSIS — F01A18 Vascular dementia, mild, with other behavioral disturbance: Secondary | ICD-10-CM | POA: Diagnosis not present

## 2021-09-30 DIAGNOSIS — D509 Iron deficiency anemia, unspecified: Secondary | ICD-10-CM | POA: Diagnosis not present

## 2021-09-30 DIAGNOSIS — N390 Urinary tract infection, site not specified: Secondary | ICD-10-CM | POA: Diagnosis not present

## 2021-09-30 DIAGNOSIS — Z9181 History of falling: Secondary | ICD-10-CM | POA: Diagnosis not present

## 2021-09-30 DIAGNOSIS — M810 Age-related osteoporosis without current pathological fracture: Secondary | ICD-10-CM | POA: Diagnosis not present

## 2021-09-30 DIAGNOSIS — S42031D Displaced fracture of lateral end of right clavicle, subsequent encounter for fracture with routine healing: Secondary | ICD-10-CM | POA: Diagnosis not present

## 2021-10-02 DIAGNOSIS — D509 Iron deficiency anemia, unspecified: Secondary | ICD-10-CM | POA: Diagnosis not present

## 2021-10-02 DIAGNOSIS — M810 Age-related osteoporosis without current pathological fracture: Secondary | ICD-10-CM | POA: Diagnosis not present

## 2021-10-02 DIAGNOSIS — N390 Urinary tract infection, site not specified: Secondary | ICD-10-CM | POA: Diagnosis not present

## 2021-10-02 DIAGNOSIS — S42031D Displaced fracture of lateral end of right clavicle, subsequent encounter for fracture with routine healing: Secondary | ICD-10-CM | POA: Diagnosis not present

## 2021-10-02 DIAGNOSIS — F01A18 Vascular dementia, mild, with other behavioral disturbance: Secondary | ICD-10-CM | POA: Diagnosis not present

## 2021-10-02 DIAGNOSIS — Z9181 History of falling: Secondary | ICD-10-CM | POA: Diagnosis not present

## 2021-10-03 DIAGNOSIS — F01A18 Vascular dementia, mild, with other behavioral disturbance: Secondary | ICD-10-CM | POA: Diagnosis not present

## 2021-10-03 DIAGNOSIS — Z9181 History of falling: Secondary | ICD-10-CM | POA: Diagnosis not present

## 2021-10-03 DIAGNOSIS — N390 Urinary tract infection, site not specified: Secondary | ICD-10-CM | POA: Diagnosis not present

## 2021-10-03 DIAGNOSIS — D509 Iron deficiency anemia, unspecified: Secondary | ICD-10-CM | POA: Diagnosis not present

## 2021-10-03 DIAGNOSIS — M810 Age-related osteoporosis without current pathological fracture: Secondary | ICD-10-CM | POA: Diagnosis not present

## 2021-10-03 DIAGNOSIS — S42031D Displaced fracture of lateral end of right clavicle, subsequent encounter for fracture with routine healing: Secondary | ICD-10-CM | POA: Diagnosis not present

## 2021-10-05 DIAGNOSIS — M810 Age-related osteoporosis without current pathological fracture: Secondary | ICD-10-CM | POA: Diagnosis not present

## 2021-10-05 DIAGNOSIS — D509 Iron deficiency anemia, unspecified: Secondary | ICD-10-CM | POA: Diagnosis not present

## 2021-10-05 DIAGNOSIS — S42031D Displaced fracture of lateral end of right clavicle, subsequent encounter for fracture with routine healing: Secondary | ICD-10-CM | POA: Diagnosis not present

## 2021-10-05 DIAGNOSIS — Z9181 History of falling: Secondary | ICD-10-CM | POA: Diagnosis not present

## 2021-10-05 DIAGNOSIS — N390 Urinary tract infection, site not specified: Secondary | ICD-10-CM | POA: Diagnosis not present

## 2021-10-05 DIAGNOSIS — F01A18 Vascular dementia, mild, with other behavioral disturbance: Secondary | ICD-10-CM | POA: Diagnosis not present

## 2021-10-10 DIAGNOSIS — D509 Iron deficiency anemia, unspecified: Secondary | ICD-10-CM | POA: Diagnosis not present

## 2021-10-10 DIAGNOSIS — S42031D Displaced fracture of lateral end of right clavicle, subsequent encounter for fracture with routine healing: Secondary | ICD-10-CM | POA: Diagnosis not present

## 2021-10-10 DIAGNOSIS — M810 Age-related osteoporosis without current pathological fracture: Secondary | ICD-10-CM | POA: Diagnosis not present

## 2021-10-10 DIAGNOSIS — F01A18 Vascular dementia, mild, with other behavioral disturbance: Secondary | ICD-10-CM | POA: Diagnosis not present

## 2021-10-10 DIAGNOSIS — Z9181 History of falling: Secondary | ICD-10-CM | POA: Diagnosis not present

## 2021-10-10 DIAGNOSIS — N390 Urinary tract infection, site not specified: Secondary | ICD-10-CM | POA: Diagnosis not present

## 2021-10-11 DIAGNOSIS — M810 Age-related osteoporosis without current pathological fracture: Secondary | ICD-10-CM | POA: Diagnosis not present

## 2021-10-11 DIAGNOSIS — N390 Urinary tract infection, site not specified: Secondary | ICD-10-CM | POA: Diagnosis not present

## 2021-10-11 DIAGNOSIS — F01A18 Vascular dementia, mild, with other behavioral disturbance: Secondary | ICD-10-CM | POA: Diagnosis not present

## 2021-10-11 DIAGNOSIS — Z9181 History of falling: Secondary | ICD-10-CM | POA: Diagnosis not present

## 2021-10-11 DIAGNOSIS — D509 Iron deficiency anemia, unspecified: Secondary | ICD-10-CM | POA: Diagnosis not present

## 2021-10-11 DIAGNOSIS — S42031D Displaced fracture of lateral end of right clavicle, subsequent encounter for fracture with routine healing: Secondary | ICD-10-CM | POA: Diagnosis not present

## 2021-10-12 DIAGNOSIS — F01A18 Vascular dementia, mild, with other behavioral disturbance: Secondary | ICD-10-CM | POA: Diagnosis not present

## 2021-10-12 DIAGNOSIS — S42031D Displaced fracture of lateral end of right clavicle, subsequent encounter for fracture with routine healing: Secondary | ICD-10-CM | POA: Diagnosis not present

## 2021-10-12 DIAGNOSIS — D509 Iron deficiency anemia, unspecified: Secondary | ICD-10-CM | POA: Diagnosis not present

## 2021-10-12 DIAGNOSIS — M810 Age-related osteoporosis without current pathological fracture: Secondary | ICD-10-CM | POA: Diagnosis not present

## 2021-10-12 DIAGNOSIS — Z9181 History of falling: Secondary | ICD-10-CM | POA: Diagnosis not present

## 2021-10-12 DIAGNOSIS — N390 Urinary tract infection, site not specified: Secondary | ICD-10-CM | POA: Diagnosis not present

## 2021-10-14 DIAGNOSIS — S42031D Displaced fracture of lateral end of right clavicle, subsequent encounter for fracture with routine healing: Secondary | ICD-10-CM | POA: Diagnosis not present

## 2021-10-14 DIAGNOSIS — M810 Age-related osteoporosis without current pathological fracture: Secondary | ICD-10-CM | POA: Diagnosis not present

## 2021-10-14 DIAGNOSIS — D509 Iron deficiency anemia, unspecified: Secondary | ICD-10-CM | POA: Diagnosis not present

## 2021-10-14 DIAGNOSIS — N390 Urinary tract infection, site not specified: Secondary | ICD-10-CM | POA: Diagnosis not present

## 2021-10-14 DIAGNOSIS — F01A18 Vascular dementia, mild, with other behavioral disturbance: Secondary | ICD-10-CM | POA: Diagnosis not present

## 2021-10-14 DIAGNOSIS — Z9181 History of falling: Secondary | ICD-10-CM | POA: Diagnosis not present

## 2021-10-16 DIAGNOSIS — Z9181 History of falling: Secondary | ICD-10-CM | POA: Diagnosis not present

## 2021-10-16 DIAGNOSIS — D509 Iron deficiency anemia, unspecified: Secondary | ICD-10-CM | POA: Diagnosis not present

## 2021-10-16 DIAGNOSIS — S42031D Displaced fracture of lateral end of right clavicle, subsequent encounter for fracture with routine healing: Secondary | ICD-10-CM | POA: Diagnosis not present

## 2021-10-16 DIAGNOSIS — N390 Urinary tract infection, site not specified: Secondary | ICD-10-CM | POA: Diagnosis not present

## 2021-10-16 DIAGNOSIS — M810 Age-related osteoporosis without current pathological fracture: Secondary | ICD-10-CM | POA: Diagnosis not present

## 2021-10-16 DIAGNOSIS — F01A18 Vascular dementia, mild, with other behavioral disturbance: Secondary | ICD-10-CM | POA: Diagnosis not present

## 2021-10-17 DIAGNOSIS — N390 Urinary tract infection, site not specified: Secondary | ICD-10-CM | POA: Diagnosis not present

## 2021-10-17 DIAGNOSIS — D509 Iron deficiency anemia, unspecified: Secondary | ICD-10-CM | POA: Diagnosis not present

## 2021-10-17 DIAGNOSIS — Z9181 History of falling: Secondary | ICD-10-CM | POA: Diagnosis not present

## 2021-10-17 DIAGNOSIS — F01A18 Vascular dementia, mild, with other behavioral disturbance: Secondary | ICD-10-CM | POA: Diagnosis not present

## 2021-10-17 DIAGNOSIS — M810 Age-related osteoporosis without current pathological fracture: Secondary | ICD-10-CM | POA: Diagnosis not present

## 2021-10-17 DIAGNOSIS — S42031D Displaced fracture of lateral end of right clavicle, subsequent encounter for fracture with routine healing: Secondary | ICD-10-CM | POA: Diagnosis not present

## 2021-10-21 DIAGNOSIS — F01A18 Vascular dementia, mild, with other behavioral disturbance: Secondary | ICD-10-CM | POA: Diagnosis not present

## 2021-10-21 DIAGNOSIS — F0150 Vascular dementia without behavioral disturbance: Secondary | ICD-10-CM | POA: Diagnosis not present

## 2021-10-21 DIAGNOSIS — N390 Urinary tract infection, site not specified: Secondary | ICD-10-CM | POA: Diagnosis not present

## 2021-10-21 DIAGNOSIS — R278 Other lack of coordination: Secondary | ICD-10-CM | POA: Diagnosis not present

## 2021-10-21 DIAGNOSIS — S42031D Displaced fracture of lateral end of right clavicle, subsequent encounter for fracture with routine healing: Secondary | ICD-10-CM | POA: Diagnosis not present

## 2021-10-21 DIAGNOSIS — R4189 Other symptoms and signs involving cognitive functions and awareness: Secondary | ICD-10-CM | POA: Diagnosis not present

## 2021-10-21 DIAGNOSIS — M810 Age-related osteoporosis without current pathological fracture: Secondary | ICD-10-CM | POA: Diagnosis not present

## 2021-10-21 DIAGNOSIS — D509 Iron deficiency anemia, unspecified: Secondary | ICD-10-CM | POA: Diagnosis not present

## 2021-10-21 DIAGNOSIS — R2681 Unsteadiness on feet: Secondary | ICD-10-CM | POA: Diagnosis not present

## 2021-10-21 DIAGNOSIS — M6281 Muscle weakness (generalized): Secondary | ICD-10-CM | POA: Diagnosis not present

## 2021-10-21 DIAGNOSIS — M5416 Radiculopathy, lumbar region: Secondary | ICD-10-CM | POA: Diagnosis not present

## 2021-10-21 DIAGNOSIS — M48061 Spinal stenosis, lumbar region without neurogenic claudication: Secondary | ICD-10-CM | POA: Diagnosis not present

## 2021-10-21 DIAGNOSIS — Z741 Need for assistance with personal care: Secondary | ICD-10-CM | POA: Diagnosis not present

## 2021-10-21 DIAGNOSIS — Z9181 History of falling: Secondary | ICD-10-CM | POA: Diagnosis not present

## 2021-10-23 DIAGNOSIS — N390 Urinary tract infection, site not specified: Secondary | ICD-10-CM | POA: Diagnosis not present

## 2021-10-23 DIAGNOSIS — Z9181 History of falling: Secondary | ICD-10-CM | POA: Diagnosis not present

## 2021-10-23 DIAGNOSIS — S42031D Displaced fracture of lateral end of right clavicle, subsequent encounter for fracture with routine healing: Secondary | ICD-10-CM | POA: Diagnosis not present

## 2021-10-23 DIAGNOSIS — M810 Age-related osteoporosis without current pathological fracture: Secondary | ICD-10-CM | POA: Diagnosis not present

## 2021-10-23 DIAGNOSIS — F01A18 Vascular dementia, mild, with other behavioral disturbance: Secondary | ICD-10-CM | POA: Diagnosis not present

## 2021-10-23 DIAGNOSIS — D509 Iron deficiency anemia, unspecified: Secondary | ICD-10-CM | POA: Diagnosis not present

## 2021-10-24 DIAGNOSIS — S42031D Displaced fracture of lateral end of right clavicle, subsequent encounter for fracture with routine healing: Secondary | ICD-10-CM | POA: Diagnosis not present

## 2021-10-24 DIAGNOSIS — M810 Age-related osteoporosis without current pathological fracture: Secondary | ICD-10-CM | POA: Diagnosis not present

## 2021-10-24 DIAGNOSIS — Z9181 History of falling: Secondary | ICD-10-CM | POA: Diagnosis not present

## 2021-10-24 DIAGNOSIS — F01A18 Vascular dementia, mild, with other behavioral disturbance: Secondary | ICD-10-CM | POA: Diagnosis not present

## 2021-10-24 DIAGNOSIS — D509 Iron deficiency anemia, unspecified: Secondary | ICD-10-CM | POA: Diagnosis not present

## 2021-10-24 DIAGNOSIS — N390 Urinary tract infection, site not specified: Secondary | ICD-10-CM | POA: Diagnosis not present

## 2021-10-25 DIAGNOSIS — Z9181 History of falling: Secondary | ICD-10-CM | POA: Diagnosis not present

## 2021-10-25 DIAGNOSIS — M810 Age-related osteoporosis without current pathological fracture: Secondary | ICD-10-CM | POA: Diagnosis not present

## 2021-10-25 DIAGNOSIS — S42031D Displaced fracture of lateral end of right clavicle, subsequent encounter for fracture with routine healing: Secondary | ICD-10-CM | POA: Diagnosis not present

## 2021-10-25 DIAGNOSIS — N390 Urinary tract infection, site not specified: Secondary | ICD-10-CM | POA: Diagnosis not present

## 2021-10-25 DIAGNOSIS — D509 Iron deficiency anemia, unspecified: Secondary | ICD-10-CM | POA: Diagnosis not present

## 2021-10-25 DIAGNOSIS — F01A18 Vascular dementia, mild, with other behavioral disturbance: Secondary | ICD-10-CM | POA: Diagnosis not present

## 2021-10-26 DIAGNOSIS — Z9181 History of falling: Secondary | ICD-10-CM | POA: Diagnosis not present

## 2021-10-26 DIAGNOSIS — N390 Urinary tract infection, site not specified: Secondary | ICD-10-CM | POA: Diagnosis not present

## 2021-10-26 DIAGNOSIS — F01A18 Vascular dementia, mild, with other behavioral disturbance: Secondary | ICD-10-CM | POA: Diagnosis not present

## 2021-10-26 DIAGNOSIS — S42031D Displaced fracture of lateral end of right clavicle, subsequent encounter for fracture with routine healing: Secondary | ICD-10-CM | POA: Diagnosis not present

## 2021-10-26 DIAGNOSIS — M810 Age-related osteoporosis without current pathological fracture: Secondary | ICD-10-CM | POA: Diagnosis not present

## 2021-10-26 DIAGNOSIS — D509 Iron deficiency anemia, unspecified: Secondary | ICD-10-CM | POA: Diagnosis not present

## 2021-10-27 DIAGNOSIS — D509 Iron deficiency anemia, unspecified: Secondary | ICD-10-CM | POA: Diagnosis not present

## 2021-10-27 DIAGNOSIS — S42031D Displaced fracture of lateral end of right clavicle, subsequent encounter for fracture with routine healing: Secondary | ICD-10-CM | POA: Diagnosis not present

## 2021-10-27 DIAGNOSIS — F01A18 Vascular dementia, mild, with other behavioral disturbance: Secondary | ICD-10-CM | POA: Diagnosis not present

## 2021-10-27 DIAGNOSIS — N390 Urinary tract infection, site not specified: Secondary | ICD-10-CM | POA: Diagnosis not present

## 2021-10-27 DIAGNOSIS — M810 Age-related osteoporosis without current pathological fracture: Secondary | ICD-10-CM | POA: Diagnosis not present

## 2021-10-27 DIAGNOSIS — Z9181 History of falling: Secondary | ICD-10-CM | POA: Diagnosis not present

## 2021-10-28 DIAGNOSIS — M810 Age-related osteoporosis without current pathological fracture: Secondary | ICD-10-CM | POA: Diagnosis not present

## 2021-10-28 DIAGNOSIS — N390 Urinary tract infection, site not specified: Secondary | ICD-10-CM | POA: Diagnosis not present

## 2021-10-28 DIAGNOSIS — D509 Iron deficiency anemia, unspecified: Secondary | ICD-10-CM | POA: Diagnosis not present

## 2021-10-28 DIAGNOSIS — Z9181 History of falling: Secondary | ICD-10-CM | POA: Diagnosis not present

## 2021-10-28 DIAGNOSIS — S42031D Displaced fracture of lateral end of right clavicle, subsequent encounter for fracture with routine healing: Secondary | ICD-10-CM | POA: Diagnosis not present

## 2021-10-28 DIAGNOSIS — F01A18 Vascular dementia, mild, with other behavioral disturbance: Secondary | ICD-10-CM | POA: Diagnosis not present

## 2021-10-31 DIAGNOSIS — D509 Iron deficiency anemia, unspecified: Secondary | ICD-10-CM | POA: Diagnosis not present

## 2021-10-31 DIAGNOSIS — S42031D Displaced fracture of lateral end of right clavicle, subsequent encounter for fracture with routine healing: Secondary | ICD-10-CM | POA: Diagnosis not present

## 2021-10-31 DIAGNOSIS — F01A18 Vascular dementia, mild, with other behavioral disturbance: Secondary | ICD-10-CM | POA: Diagnosis not present

## 2021-10-31 DIAGNOSIS — Z9181 History of falling: Secondary | ICD-10-CM | POA: Diagnosis not present

## 2021-10-31 DIAGNOSIS — M810 Age-related osteoporosis without current pathological fracture: Secondary | ICD-10-CM | POA: Diagnosis not present

## 2021-10-31 DIAGNOSIS — N390 Urinary tract infection, site not specified: Secondary | ICD-10-CM | POA: Diagnosis not present

## 2021-11-02 DIAGNOSIS — D509 Iron deficiency anemia, unspecified: Secondary | ICD-10-CM | POA: Diagnosis not present

## 2021-11-02 DIAGNOSIS — F01A18 Vascular dementia, mild, with other behavioral disturbance: Secondary | ICD-10-CM | POA: Diagnosis not present

## 2021-11-02 DIAGNOSIS — S42031D Displaced fracture of lateral end of right clavicle, subsequent encounter for fracture with routine healing: Secondary | ICD-10-CM | POA: Diagnosis not present

## 2021-11-02 DIAGNOSIS — N390 Urinary tract infection, site not specified: Secondary | ICD-10-CM | POA: Diagnosis not present

## 2021-11-02 DIAGNOSIS — Z9181 History of falling: Secondary | ICD-10-CM | POA: Diagnosis not present

## 2021-11-02 DIAGNOSIS — M810 Age-related osteoporosis without current pathological fracture: Secondary | ICD-10-CM | POA: Diagnosis not present

## 2021-11-03 DIAGNOSIS — N390 Urinary tract infection, site not specified: Secondary | ICD-10-CM | POA: Diagnosis not present

## 2021-11-03 DIAGNOSIS — M810 Age-related osteoporosis without current pathological fracture: Secondary | ICD-10-CM | POA: Diagnosis not present

## 2021-11-03 DIAGNOSIS — D509 Iron deficiency anemia, unspecified: Secondary | ICD-10-CM | POA: Diagnosis not present

## 2021-11-03 DIAGNOSIS — Z9181 History of falling: Secondary | ICD-10-CM | POA: Diagnosis not present

## 2021-11-03 DIAGNOSIS — S42031D Displaced fracture of lateral end of right clavicle, subsequent encounter for fracture with routine healing: Secondary | ICD-10-CM | POA: Diagnosis not present

## 2021-11-03 DIAGNOSIS — F01A18 Vascular dementia, mild, with other behavioral disturbance: Secondary | ICD-10-CM | POA: Diagnosis not present

## 2021-11-04 DIAGNOSIS — S42031D Displaced fracture of lateral end of right clavicle, subsequent encounter for fracture with routine healing: Secondary | ICD-10-CM | POA: Diagnosis not present

## 2021-11-04 DIAGNOSIS — N390 Urinary tract infection, site not specified: Secondary | ICD-10-CM | POA: Diagnosis not present

## 2021-11-04 DIAGNOSIS — F01A18 Vascular dementia, mild, with other behavioral disturbance: Secondary | ICD-10-CM | POA: Diagnosis not present

## 2021-11-04 DIAGNOSIS — M810 Age-related osteoporosis without current pathological fracture: Secondary | ICD-10-CM | POA: Diagnosis not present

## 2021-11-04 DIAGNOSIS — D509 Iron deficiency anemia, unspecified: Secondary | ICD-10-CM | POA: Diagnosis not present

## 2021-11-04 DIAGNOSIS — Z9181 History of falling: Secondary | ICD-10-CM | POA: Diagnosis not present

## 2021-11-06 ENCOUNTER — Non-Acute Institutional Stay (SKILLED_NURSING_FACILITY): Payer: Medicare Other | Admitting: Student

## 2021-11-06 DIAGNOSIS — F01518 Vascular dementia, unspecified severity, with other behavioral disturbance: Secondary | ICD-10-CM

## 2021-11-07 ENCOUNTER — Encounter: Payer: Self-pay | Admitting: Student

## 2021-11-07 NOTE — Progress Notes (Signed)
Location:  Other Nursing Home Room Number: 211 HERDE of Service:  Nursing [phone]) Provider:  [name], MD  [name], MD  Patient Care Team: [name], MD as PCP - General (Internal Medicine)  Extended Emergency Contact Information Primary Emergency Contact: Heeney,[name] Address: [address]          [place], [place] [address] [name] of [place] Phone: [phone] Relation: Spouse Secondary Emergency Contact: [place] Phone: [phone] Relation: Son  Code Status:  DNAR Goals of care: Advanced Directive information    06/22/2021    1:57 PM  Advanced Directives  Would patient like information on creating a medical advance directive? No - Patient declined     Chief Complaint  Patient presents with   Insomnia         HPI:  Pt is a 88 y.o. female seen today for an acute visit for insomnia and increased daytime sleepiness. Patient has minimal contribution to conversation given her level of dementia. She states her full name and date of birth. She knows she is at [facility], however, statis it is 19-something and she "loses track of the time." She isn't sure about month or time of year.   Nursing states she has been sitting at the nursing station through the night. She sleeps during spousal visits and then wakes up upset that she hasn't seen her husband. Her memory has declined. She continues to forget things that have happened.    Past Medical History:  Diagnosis Date   Arthritis    Chest pain    a. 05/2015 - post-prandial.   GERD (gastroesophageal reflux disease)    a. takes Omeprazole daily, prev seen by D. Brodie.   Heart murmur    a. 05/2013 Echo: EF 55-60%, gr2 DD, mild AI/MR.   History of blood transfusion    no abnormal reaction noted    History of migraine headaches    a. last 10/2013   Iron deficiency anemia    Joint pain    PONV (postoperative nausea and vomiting)    Rectocele 2011   Past Surgical History:  Procedure  Laterality Date   cataract surgery  Bilateral    R 03/2001; L 05/2001.   COLONOSCOPY     HYSTEROSCOPY  11/1999   w/ resection of endometrial polyp by uterine curetting.   INTRAMEDULLARY (IM) NAIL INTERTROCHANTERIC Right 07/15/2020   Procedure: INTRAMEDULLARY (IM) NAIL INTERTROCHANTRIC;  Surgeon: Thornton Park, MD;  Location: [facility];  Service: Orthopedics;  Laterality: Right;   JOINT REPLACEMENT Bilateral R-9/04, L- 8/05   Total Knee Arthroplasties.   KYPHOPLASTY N/A 04/10/2019   Procedure: KYPHOPLASTY;  Surgeon: Hessie Knows, MD;  Location: [facility];  Service: Orthopedics;  Laterality: N/A;   REVERSE SHOULDER ARTHROPLASTY Right 11/26/2013   Procedure: RIGHT REVERSE SHOULDER ARTHROPLASTY;  Surgeon: Marin Shutter, MD;  Location: [place];  Service: Orthopedics;  Laterality: Right;   SHOULDER ARTHROSCOPY Right    THYROIDECTOMY  at age 56   TONSILLECTOMY      Allergies  Allergen Reactions   Sulfonamide Derivatives Hives   Sulfa Antibiotics Other (See Comments) and Rash    Outpatient Encounter Medications as of 11/06/2021  Medication Sig   mirabegron ER (MYRBETRIQ) 25 MG TB24 tablet Take 25 mg by mouth daily.   mirtazapine (REMERON) 15 MG tablet Take 15 mg by mouth at bedtime.   polyethylene glycol (MIRALAX / GLYCOLAX) 17 g packet Take 17 g by mouth daily.   cholecalciferol (VITAMIN D) 1000 units tablet  Take 1,000 Units by mouth daily.   saccharomyces boulardii (FLORASTOR) 250 MG capsule Take 250 mg by mouth daily.   trolamine salicylate (ASPERCREME) 10 % cream Apply 1 application topically as needed for muscle pain.   [DISCONTINUED] alendronate (FOSAMAX) 70 MG tablet TAKE 1 TABLET BY MOUTH ONCE EVERY 7 DAYS BEFORE BREAKFAST   [DISCONTINUED] gabapentin (NEURONTIN) 100 MG capsule TAKE 1 CAPSULE BY MOUTH THREE TIMES A DAY   [DISCONTINUED] traMADol (ULTRAM) 50 MG tablet Take 1 tablet (50 mg total) by mouth every 6 (six) hours as needed.   No facility-administered encounter medications on  file as of 11/06/2021.    Review of Systems  Unable to perform ROS: Dementia     There is no immunization history on file for this patient. Pertinent  Health Maintenance Due  Topic Date Due   DEXA SCAN  Never done   INFLUENZA VACCINE  09/19/2021      06/21/2021    6:56 PM 06/22/2021    8:36 AM 06/22/2021    1:00 PM 06/22/2021    9:40 PM 06/27/2021   11:48 PM  Fall Risk  Patient Fall Risk Level High fall risk High fall risk High fall risk High fall risk High fall risk   Functional Status Survey:    Vitals:   11/07/21 1250  BP: (!) 148/78  Pulse: 82  Resp: 18  Temp: 98 F (36.7 C)  SpO2: 93%  Weight: 109 lb (49.4 kg)   Body mass index is 18.14 kg/m. Physical Exam Cardiovascular:     Rate and Rhythm: Normal rate.  Pulmonary:     Effort: Pulmonary effort is normal.     Breath sounds: Normal breath sounds.  Abdominal:     General: Abdomen is flat. Bowel sounds are normal.     Palpations: Abdomen is soft.  Musculoskeletal:        General: No swelling.  Skin:    General: Skin is warm and dry.     Capillary Refill: Capillary refill takes 2 to 3 seconds.  Neurological:     Mental Status: She is alert.    Labs reviewed: Recent Labs    06/22/21 0651 06/23/21 1018 06/27/21 2350  NA 139 141 139  K 3.6 3.5 4.3  CL 108 107 107  CO2 21* 23 28  GLUCOSE 107* 142* 102*  BUN 24* 20 24*  CREATININE 0.71 0.81 0.84  CALCIUM 8.0* 8.5* 8.4*   No results for input(s): "AST", "ALT", "ALKPHOS", "BILITOT", "PROT", "ALBUMIN" in the last 8760 hours. Recent Labs    06/22/21 0651 06/23/21 1018 06/27/21 2350  WBC 15.2* 8.9 11.4*  NEUTROABS  --  6.7 8.2*  HGB 8.9* 9.4* 8.6*  HCT 27.5* 29.1* 26.8*  MCV 101.9* 99.0 99.3  PLT 291 349 460*   No results found for: "TSH" No results found for: "HGBA1C" No results found for: "CHOL", "HDL", "LDLCALC", "LDLDIRECT", "TRIG", "CHOLHDL"  Significant Diagnostic Results in last 30 days:  No results found.  Assessment/Plan 1. Vascular  dementia with behavior disturbance [facility]) Patient has a history of disturbances with her dementia. Recently has reversed sleep-wake cycles. In an effort to prevent introducing a new meditation at this time will increase mirtazapine to 15 mg nightly to aid with reestablishing sleep-wake cycle. If no success will trial trazodone.     Family/ staff Communication: nursing updated on plan   Labs/tests ordered:  deferred at this time  Tomasa Rand, MD, [place] [phone]

## 2021-11-08 DIAGNOSIS — Z9181 History of falling: Secondary | ICD-10-CM | POA: Diagnosis not present

## 2021-11-08 DIAGNOSIS — M810 Age-related osteoporosis without current pathological fracture: Secondary | ICD-10-CM | POA: Diagnosis not present

## 2021-11-08 DIAGNOSIS — N390 Urinary tract infection, site not specified: Secondary | ICD-10-CM | POA: Diagnosis not present

## 2021-11-08 DIAGNOSIS — D509 Iron deficiency anemia, unspecified: Secondary | ICD-10-CM | POA: Diagnosis not present

## 2021-11-08 DIAGNOSIS — S42031D Displaced fracture of lateral end of right clavicle, subsequent encounter for fracture with routine healing: Secondary | ICD-10-CM | POA: Diagnosis not present

## 2021-11-08 DIAGNOSIS — F01A18 Vascular dementia, mild, with other behavioral disturbance: Secondary | ICD-10-CM | POA: Diagnosis not present

## 2021-11-09 DIAGNOSIS — S42031D Displaced fracture of lateral end of right clavicle, subsequent encounter for fracture with routine healing: Secondary | ICD-10-CM | POA: Diagnosis not present

## 2021-11-09 DIAGNOSIS — F01A18 Vascular dementia, mild, with other behavioral disturbance: Secondary | ICD-10-CM | POA: Diagnosis not present

## 2021-11-09 DIAGNOSIS — D509 Iron deficiency anemia, unspecified: Secondary | ICD-10-CM | POA: Diagnosis not present

## 2021-11-09 DIAGNOSIS — M810 Age-related osteoporosis without current pathological fracture: Secondary | ICD-10-CM | POA: Diagnosis not present

## 2021-11-09 DIAGNOSIS — N390 Urinary tract infection, site not specified: Secondary | ICD-10-CM | POA: Diagnosis not present

## 2021-11-09 DIAGNOSIS — Z9181 History of falling: Secondary | ICD-10-CM | POA: Diagnosis not present

## 2021-11-11 DIAGNOSIS — M810 Age-related osteoporosis without current pathological fracture: Secondary | ICD-10-CM | POA: Diagnosis not present

## 2021-11-11 DIAGNOSIS — F01A18 Vascular dementia, mild, with other behavioral disturbance: Secondary | ICD-10-CM | POA: Diagnosis not present

## 2021-11-11 DIAGNOSIS — S42031D Displaced fracture of lateral end of right clavicle, subsequent encounter for fracture with routine healing: Secondary | ICD-10-CM | POA: Diagnosis not present

## 2021-11-11 DIAGNOSIS — N390 Urinary tract infection, site not specified: Secondary | ICD-10-CM | POA: Diagnosis not present

## 2021-11-11 DIAGNOSIS — D509 Iron deficiency anemia, unspecified: Secondary | ICD-10-CM | POA: Diagnosis not present

## 2021-11-11 DIAGNOSIS — Z9181 History of falling: Secondary | ICD-10-CM | POA: Diagnosis not present

## 2021-11-14 DIAGNOSIS — M810 Age-related osteoporosis without current pathological fracture: Secondary | ICD-10-CM | POA: Diagnosis not present

## 2021-11-14 DIAGNOSIS — D509 Iron deficiency anemia, unspecified: Secondary | ICD-10-CM | POA: Diagnosis not present

## 2021-11-14 DIAGNOSIS — N390 Urinary tract infection, site not specified: Secondary | ICD-10-CM | POA: Diagnosis not present

## 2021-11-14 DIAGNOSIS — F01A18 Vascular dementia, mild, with other behavioral disturbance: Secondary | ICD-10-CM | POA: Diagnosis not present

## 2021-11-14 DIAGNOSIS — S42031D Displaced fracture of lateral end of right clavicle, subsequent encounter for fracture with routine healing: Secondary | ICD-10-CM | POA: Diagnosis not present

## 2021-11-14 DIAGNOSIS — Z9181 History of falling: Secondary | ICD-10-CM | POA: Diagnosis not present

## 2021-11-16 DIAGNOSIS — Z9181 History of falling: Secondary | ICD-10-CM | POA: Diagnosis not present

## 2021-11-16 DIAGNOSIS — D509 Iron deficiency anemia, unspecified: Secondary | ICD-10-CM | POA: Diagnosis not present

## 2021-11-16 DIAGNOSIS — S42031D Displaced fracture of lateral end of right clavicle, subsequent encounter for fracture with routine healing: Secondary | ICD-10-CM | POA: Diagnosis not present

## 2021-11-16 DIAGNOSIS — N390 Urinary tract infection, site not specified: Secondary | ICD-10-CM | POA: Diagnosis not present

## 2021-11-16 DIAGNOSIS — F01A18 Vascular dementia, mild, with other behavioral disturbance: Secondary | ICD-10-CM | POA: Diagnosis not present

## 2021-11-16 DIAGNOSIS — M810 Age-related osteoporosis without current pathological fracture: Secondary | ICD-10-CM | POA: Diagnosis not present

## 2021-11-18 DIAGNOSIS — M810 Age-related osteoporosis without current pathological fracture: Secondary | ICD-10-CM | POA: Diagnosis not present

## 2021-11-18 DIAGNOSIS — F01A18 Vascular dementia, mild, with other behavioral disturbance: Secondary | ICD-10-CM | POA: Diagnosis not present

## 2021-11-18 DIAGNOSIS — N390 Urinary tract infection, site not specified: Secondary | ICD-10-CM | POA: Diagnosis not present

## 2021-11-18 DIAGNOSIS — Z9181 History of falling: Secondary | ICD-10-CM | POA: Diagnosis not present

## 2021-11-18 DIAGNOSIS — S42031D Displaced fracture of lateral end of right clavicle, subsequent encounter for fracture with routine healing: Secondary | ICD-10-CM | POA: Diagnosis not present

## 2021-11-18 DIAGNOSIS — D509 Iron deficiency anemia, unspecified: Secondary | ICD-10-CM | POA: Diagnosis not present

## 2021-11-21 DIAGNOSIS — R2681 Unsteadiness on feet: Secondary | ICD-10-CM | POA: Diagnosis not present

## 2021-11-21 DIAGNOSIS — F0150 Vascular dementia without behavioral disturbance: Secondary | ICD-10-CM | POA: Diagnosis not present

## 2021-11-21 DIAGNOSIS — R278 Other lack of coordination: Secondary | ICD-10-CM | POA: Diagnosis not present

## 2021-11-21 DIAGNOSIS — Z9181 History of falling: Secondary | ICD-10-CM | POA: Diagnosis not present

## 2021-11-21 DIAGNOSIS — Z741 Need for assistance with personal care: Secondary | ICD-10-CM | POA: Diagnosis not present

## 2021-11-21 DIAGNOSIS — R4189 Other symptoms and signs involving cognitive functions and awareness: Secondary | ICD-10-CM | POA: Diagnosis not present

## 2021-11-21 DIAGNOSIS — D509 Iron deficiency anemia, unspecified: Secondary | ICD-10-CM | POA: Diagnosis not present

## 2021-11-21 DIAGNOSIS — M810 Age-related osteoporosis without current pathological fracture: Secondary | ICD-10-CM | POA: Diagnosis not present

## 2021-11-21 DIAGNOSIS — M48061 Spinal stenosis, lumbar region without neurogenic claudication: Secondary | ICD-10-CM | POA: Diagnosis not present

## 2021-11-21 DIAGNOSIS — M5416 Radiculopathy, lumbar region: Secondary | ICD-10-CM | POA: Diagnosis not present

## 2021-11-21 DIAGNOSIS — N390 Urinary tract infection, site not specified: Secondary | ICD-10-CM | POA: Diagnosis not present

## 2021-11-21 DIAGNOSIS — F01A18 Vascular dementia, mild, with other behavioral disturbance: Secondary | ICD-10-CM | POA: Diagnosis not present

## 2021-11-21 DIAGNOSIS — M6281 Muscle weakness (generalized): Secondary | ICD-10-CM | POA: Diagnosis not present

## 2021-11-21 DIAGNOSIS — S42031D Displaced fracture of lateral end of right clavicle, subsequent encounter for fracture with routine healing: Secondary | ICD-10-CM | POA: Diagnosis not present

## 2021-11-23 DIAGNOSIS — D509 Iron deficiency anemia, unspecified: Secondary | ICD-10-CM | POA: Diagnosis not present

## 2021-11-23 DIAGNOSIS — N390 Urinary tract infection, site not specified: Secondary | ICD-10-CM | POA: Diagnosis not present

## 2021-11-23 DIAGNOSIS — S42031D Displaced fracture of lateral end of right clavicle, subsequent encounter for fracture with routine healing: Secondary | ICD-10-CM | POA: Diagnosis not present

## 2021-11-23 DIAGNOSIS — F01A18 Vascular dementia, mild, with other behavioral disturbance: Secondary | ICD-10-CM | POA: Diagnosis not present

## 2021-11-23 DIAGNOSIS — M810 Age-related osteoporosis without current pathological fracture: Secondary | ICD-10-CM | POA: Diagnosis not present

## 2021-11-23 DIAGNOSIS — Z9181 History of falling: Secondary | ICD-10-CM | POA: Diagnosis not present

## 2021-11-24 DIAGNOSIS — S42031D Displaced fracture of lateral end of right clavicle, subsequent encounter for fracture with routine healing: Secondary | ICD-10-CM | POA: Diagnosis not present

## 2021-11-24 DIAGNOSIS — Z9181 History of falling: Secondary | ICD-10-CM | POA: Diagnosis not present

## 2021-11-24 DIAGNOSIS — F01A18 Vascular dementia, mild, with other behavioral disturbance: Secondary | ICD-10-CM | POA: Diagnosis not present

## 2021-11-24 DIAGNOSIS — N390 Urinary tract infection, site not specified: Secondary | ICD-10-CM | POA: Diagnosis not present

## 2021-11-24 DIAGNOSIS — M810 Age-related osteoporosis without current pathological fracture: Secondary | ICD-10-CM | POA: Diagnosis not present

## 2021-11-24 DIAGNOSIS — D509 Iron deficiency anemia, unspecified: Secondary | ICD-10-CM | POA: Diagnosis not present

## 2021-11-28 DIAGNOSIS — Z9181 History of falling: Secondary | ICD-10-CM | POA: Diagnosis not present

## 2021-11-28 DIAGNOSIS — D509 Iron deficiency anemia, unspecified: Secondary | ICD-10-CM | POA: Diagnosis not present

## 2021-11-28 DIAGNOSIS — N390 Urinary tract infection, site not specified: Secondary | ICD-10-CM | POA: Diagnosis not present

## 2021-11-28 DIAGNOSIS — M810 Age-related osteoporosis without current pathological fracture: Secondary | ICD-10-CM | POA: Diagnosis not present

## 2021-11-28 DIAGNOSIS — F01A18 Vascular dementia, mild, with other behavioral disturbance: Secondary | ICD-10-CM | POA: Diagnosis not present

## 2021-11-28 DIAGNOSIS — S42031D Displaced fracture of lateral end of right clavicle, subsequent encounter for fracture with routine healing: Secondary | ICD-10-CM | POA: Diagnosis not present

## 2021-11-30 DIAGNOSIS — D509 Iron deficiency anemia, unspecified: Secondary | ICD-10-CM | POA: Diagnosis not present

## 2021-11-30 DIAGNOSIS — S42031D Displaced fracture of lateral end of right clavicle, subsequent encounter for fracture with routine healing: Secondary | ICD-10-CM | POA: Diagnosis not present

## 2021-11-30 DIAGNOSIS — M810 Age-related osteoporosis without current pathological fracture: Secondary | ICD-10-CM | POA: Diagnosis not present

## 2021-11-30 DIAGNOSIS — Z9181 History of falling: Secondary | ICD-10-CM | POA: Diagnosis not present

## 2021-11-30 DIAGNOSIS — N390 Urinary tract infection, site not specified: Secondary | ICD-10-CM | POA: Diagnosis not present

## 2021-11-30 DIAGNOSIS — F01A18 Vascular dementia, mild, with other behavioral disturbance: Secondary | ICD-10-CM | POA: Diagnosis not present

## 2021-12-04 DIAGNOSIS — M810 Age-related osteoporosis without current pathological fracture: Secondary | ICD-10-CM | POA: Diagnosis not present

## 2021-12-04 DIAGNOSIS — S42031D Displaced fracture of lateral end of right clavicle, subsequent encounter for fracture with routine healing: Secondary | ICD-10-CM | POA: Diagnosis not present

## 2021-12-04 DIAGNOSIS — N390 Urinary tract infection, site not specified: Secondary | ICD-10-CM | POA: Diagnosis not present

## 2021-12-04 DIAGNOSIS — F01A18 Vascular dementia, mild, with other behavioral disturbance: Secondary | ICD-10-CM | POA: Diagnosis not present

## 2021-12-04 DIAGNOSIS — D509 Iron deficiency anemia, unspecified: Secondary | ICD-10-CM | POA: Diagnosis not present

## 2021-12-04 DIAGNOSIS — Z9181 History of falling: Secondary | ICD-10-CM | POA: Diagnosis not present

## 2021-12-05 DIAGNOSIS — F01A18 Vascular dementia, mild, with other behavioral disturbance: Secondary | ICD-10-CM | POA: Diagnosis not present

## 2021-12-05 DIAGNOSIS — D509 Iron deficiency anemia, unspecified: Secondary | ICD-10-CM | POA: Diagnosis not present

## 2021-12-05 DIAGNOSIS — S42031D Displaced fracture of lateral end of right clavicle, subsequent encounter for fracture with routine healing: Secondary | ICD-10-CM | POA: Diagnosis not present

## 2021-12-05 DIAGNOSIS — N390 Urinary tract infection, site not specified: Secondary | ICD-10-CM | POA: Diagnosis not present

## 2021-12-05 DIAGNOSIS — Z9181 History of falling: Secondary | ICD-10-CM | POA: Diagnosis not present

## 2021-12-05 DIAGNOSIS — M810 Age-related osteoporosis without current pathological fracture: Secondary | ICD-10-CM | POA: Diagnosis not present

## 2021-12-14 ENCOUNTER — Non-Acute Institutional Stay (SKILLED_NURSING_FACILITY): Payer: Medicare Other | Admitting: Nurse Practitioner

## 2021-12-14 ENCOUNTER — Encounter: Payer: Self-pay | Admitting: Nurse Practitioner

## 2021-12-14 DIAGNOSIS — M1990 Unspecified osteoarthritis, unspecified site: Secondary | ICD-10-CM | POA: Diagnosis not present

## 2021-12-14 DIAGNOSIS — N3281 Overactive bladder: Secondary | ICD-10-CM | POA: Diagnosis not present

## 2021-12-14 DIAGNOSIS — K219 Gastro-esophageal reflux disease without esophagitis: Secondary | ICD-10-CM | POA: Diagnosis not present

## 2021-12-14 DIAGNOSIS — D509 Iron deficiency anemia, unspecified: Secondary | ICD-10-CM

## 2021-12-14 DIAGNOSIS — F01518 Vascular dementia, unspecified severity, with other behavioral disturbance: Secondary | ICD-10-CM | POA: Diagnosis not present

## 2021-12-14 DIAGNOSIS — K5904 Chronic idiopathic constipation: Secondary | ICD-10-CM | POA: Diagnosis not present

## 2021-12-14 NOTE — Progress Notes (Signed)
Location:   [place] Room Number: 631-S Place of Service:  SNF [phone]) Provider:  [name], MD  [name], MD  Patient Care Team: [name], MD as PCP - General [facility] Medicine)  Extended Emergency Contact Information Primary Emergency Contact: Flater,[name] Address: [address]          [place], [place] [address] [name] of [place] Phone: [phone] Relation: Spouse Secondary Emergency Contact: [place] Mobile Phone: [phone] Relation: Son  Code Status:  DNR Goals of care: Advanced Directive information    12/14/2021   11:56 AM  Advanced Directives  Does Patient Have a Medical Advance Directive? Yes  Type of Advance Directive Out of facility DNR (pink MOST or yellow form)  Does patient want to make changes to medical advance directive? No - Patient declined     Chief Complaint  Patient presents with   Medical Management of Chronic Issues    Routine follow up   Immunizations    COVID vaccine,tetanus/tdap, shingrix, pneumonia and flu vaccine   Quality Metric Gaps    Desa scan, Medicare annual wellness    HPI:  Pt is a 88 y.o. female seen today for medical management of chronic diseases.  Pt with hx of dementia, GERD, iron def anemia, OA, overactive bladder, altered sleep wake cycle and constipation.  She continues to stay up a lot at night but sleeps during the day. She reports this is how she has done for years.  No mood disturbances.  Reports pain in her ankles/legs but improves with tylenol.  Weight has been stable.  Nursing without concerns at this time.    Past Medical History:  Diagnosis Date   Arthritis    Chest pain    a. 05/2015 - post-prandial.   GERD (gastroesophageal reflux disease)    a. takes Omeprazole daily, prev seen by D. Brodie.   Heart murmur    a. 05/2013 Echo: EF 55-60%, gr2 DD, mild AI/MR.   History of blood transfusion    no abnormal reaction noted    History of  migraine headaches    a. last 10/2013   Iron deficiency anemia    Joint pain    PONV (postoperative nausea and vomiting)    Rectocele 2011   Past Surgical History:  Procedure Laterality Date   cataract surgery  Bilateral    R 03/2001; L 05/2001.   COLONOSCOPY     HYSTEROSCOPY  11/1999   w/ resection of endometrial polyp by uterine curetting.   INTRAMEDULLARY (IM) NAIL INTERTROCHANTERIC Right 07/15/2020   Procedure: INTRAMEDULLARY (IM) NAIL INTERTROCHANTRIC;  Surgeon: Thornton Park, MD;  Location: [facility];  Service: Orthopedics;  Laterality: Right;   JOINT REPLACEMENT Bilateral R-9/04, L- 8/05   Total Knee Arthroplasties.   KYPHOPLASTY N/A 04/10/2019   Procedure: KYPHOPLASTY;  Surgeon: Hessie Knows, MD;  Location: [facility];  Service: Orthopedics;  Laterality: N/A;   REVERSE SHOULDER ARTHROPLASTY Right 11/26/2013   Procedure: RIGHT REVERSE SHOULDER ARTHROPLASTY;  Surgeon: Marin Shutter, MD;  Location: [place];  Service: Orthopedics;  Laterality: Right;   SHOULDER ARTHROSCOPY Right    THYROIDECTOMY  at age 86   TONSILLECTOMY      Allergies  Allergen Reactions   Sulfonamide Derivatives Hives   Sulfa Antibiotics Other (See Comments) and Rash    Allergies as of 12/14/2021       Reactions   Sulfonamide Derivatives Hives   Sulfa Antibiotics Other (See Comments), Rash  Medication List        Accurate as of December 14, 2021 11:57 AM. If you have any questions, ask your nurse or doctor.          STOP taking these medications    trolamine salicylate 10 % cream Commonly known as: ASPERCREME Stopped by: Lauree Chandler, NP       TAKE these medications    acetaminophen 500 MG tablet Commonly known as: TYLENOL Take 500 mg by mouth 3 (three) times daily.   bismuth subsalicylate 503 UU/82CM suspension Commonly known as: PEPTO BISMOL Take 30 mLs by mouth every 4 (four) hours as needed.   cholecalciferol 1000 units tablet Commonly known as: VITAMIN D Take  1,000 Units by mouth daily.   ferrous sulfate 325 (65 FE) MG tablet Take 1 tablet (325 mg total) by mouth every other day.   mirabegron ER 25 MG Tb24 tablet Commonly known as: MYRBETRIQ Take 25 mg by mouth daily.   mirtazapine 15 MG tablet Commonly known as: REMERON Take 15 mg by mouth at bedtime.   polyethylene glycol 17 g packet Commonly known as: MIRALAX / GLYCOLAX Take 17 g by mouth daily.   saccharomyces boulardii 250 MG capsule Commonly known as: FLORASTOR Take 250 mg by mouth daily.        Review of Systems  Constitutional:  Negative for activity change, appetite change, fatigue and unexpected weight change.  HENT:  Negative for congestion and hearing loss.   Eyes: Negative.   Respiratory:  Negative for cough and shortness of breath.   Cardiovascular:  Negative for chest pain, palpitations and leg swelling.  Gastrointestinal:  Negative for abdominal pain, constipation and diarrhea.  Genitourinary:  Negative for difficulty urinating and dysuria.  Musculoskeletal:  Positive for arthralgias. Negative for myalgias.  Skin:  Negative for color change and wound.  Neurological:  Negative for dizziness and weakness.  Psychiatric/Behavioral:  Negative for agitation, behavioral problems and confusion.     Immunization History  Administered Date(s) Administered   Influenza-Unspecified 12/05/2021   Pneumococcal Conjugate-13 03/31/2014   Pneumococcal-Unspecified 02/19/2001, 04/17/2016   Tdap 11/13/2011   Pertinent  Health Maintenance Due  Topic Date Due   DEXA SCAN  Never done   INFLUENZA VACCINE  Completed      06/21/2021    6:56 PM 06/22/2021    8:36 AM 06/22/2021    1:00 PM 06/22/2021    9:40 PM 06/27/2021   11:48 PM  Fall Risk  Patient Fall Risk Level High fall risk High fall risk High fall risk High fall risk High fall risk   Functional Status Survey:    Vitals:   12/14/21 1145  BP: (!) 117/57  Pulse: 82  Resp: 16  Temp: 97.7 F (36.5 C)  SpO2: 93%  Weight:  109 lb 3.2 oz (49.5 kg)  Height: '5\' 5"'$  (1.651 m)   Body mass index is 18.17 kg/m. Physical Exam Constitutional:      General: She is not in acute distress.    Appearance: She is well-developed. She is not diaphoretic.  HENT:     Head: Normocephalic and atraumatic.     Mouth/Throat:     Pharynx: No oropharyngeal exudate.  Eyes:     Conjunctiva/sclera: Conjunctivae normal.     Pupils: Pupils are equal, round, and reactive to light.  Cardiovascular:     Rate and Rhythm: Normal rate and regular rhythm.     Heart sounds: Murmur heard.  Pulmonary:     Effort: Pulmonary effort  is normal.     Breath sounds: Normal breath sounds.  Abdominal:     General: Bowel sounds are normal.     Palpations: Abdomen is soft.  Musculoskeletal:     Cervical back: Normal range of motion and neck supple.     Right lower leg: No edema.     Left lower leg: No edema.  Skin:    General: Skin is warm and dry.  Neurological:     Mental Status: She is alert. Mental status is at baseline.     Gait: Gait abnormal (uses wheelchair).  Psychiatric:        Mood and Affect: Mood normal.     Labs reviewed: Recent Labs    06/22/21 0651 06/23/21 1018 06/27/21 2350  NA 139 141 139  K 3.6 3.5 4.3  CL 108 107 107  CO2 21* 23 28  GLUCOSE 107* 142* 102*  BUN 24* 20 24*  CREATININE 0.71 0.81 0.84  CALCIUM 8.0* 8.5* 8.4*   No results for input(s): "AST", "ALT", "ALKPHOS", "BILITOT", "PROT", "ALBUMIN" in the last 8760 hours. Recent Labs    06/22/21 0651 06/23/21 1018 06/27/21 2350  WBC 15.2* 8.9 11.4*  NEUTROABS  --  6.7 8.2*  HGB 8.9* 9.4* 8.6*  HCT 27.5* 29.1* 26.8*  MCV 101.9* 99.0 99.3  PLT 291 349 460*   No results found for: "TSH" No results found for: "HGBA1C" No results found for: "CHOL", "HDL", "LDLCALC", "LDLDIRECT", "TRIG", "CHOLHDL"  Significant Diagnostic Results in last 30 days:  No results found.  Assessment/Plan 1. Vascular dementia with behavior disturbance (HCC) -Stable, no  acute changes in cognitive or functional status, continue supportive care.   2. GERD without esophagitis -stable, not on any routine medications at this time  3. Iron deficiency anemia, unspecified iron deficiency anemia type Continues on supplement, will follow up cbc  4. Chronic idiopathic constipation -well controlled on miralax   5. Osteoarthritis, unspecified osteoarthritis type, unspecified site -controlled on tylenol scheduled.   6. OAB (overactive bladder) Stable, Continues on myrbetriq   Jibreel Fedewa K. [place], [facility] [phone]

## 2021-12-18 DIAGNOSIS — D649 Anemia, unspecified: Secondary | ICD-10-CM | POA: Diagnosis not present

## 2021-12-18 LAB — BASIC METABOLIC PANEL
BUN: 24 — AB (ref 4–21)
CO2: 24 — AB (ref 13–22)
Chloride: 105 (ref 99–108)
Creatinine: 0.7 (ref 0.5–1.1)
Glucose: 97
Potassium: 4.1 mEq/L (ref 3.5–5.1)
Sodium: 140 (ref 137–147)

## 2021-12-18 LAB — CBC AND DIFFERENTIAL
HCT: 29 — AB (ref 36–46)
Hemoglobin: 10 — AB (ref 12.0–16.0)
Neutrophils Absolute: 5017
Platelets: 331 10*3/uL (ref 150–400)
WBC: 7.4

## 2021-12-18 LAB — COMPREHENSIVE METABOLIC PANEL
Albumin: 3.7 (ref 3.5–5.0)
Calcium: 8.6 — AB (ref 8.7–10.7)
Globulin: 3

## 2021-12-18 LAB — HEPATIC FUNCTION PANEL
ALT: 17 U/L (ref 7–35)
AST: 21 (ref 13–35)
Alkaline Phosphatase: 97 (ref 25–125)
Bilirubin, Total: 0.6

## 2021-12-18 LAB — CBC: RBC: 3.06 — AB (ref 3.87–5.11)

## 2021-12-29 DIAGNOSIS — Z23 Encounter for immunization: Secondary | ICD-10-CM | POA: Diagnosis not present

## 2022-02-01 ENCOUNTER — Encounter: Payer: Self-pay | Admitting: Nurse Practitioner

## 2022-02-01 ENCOUNTER — Non-Acute Institutional Stay (SKILLED_NURSING_FACILITY): Payer: Medicare Other | Admitting: Nurse Practitioner

## 2022-02-01 DIAGNOSIS — D509 Iron deficiency anemia, unspecified: Secondary | ICD-10-CM

## 2022-02-01 DIAGNOSIS — F01518 Vascular dementia, unspecified severity, with other behavioral disturbance: Secondary | ICD-10-CM

## 2022-02-01 DIAGNOSIS — K5904 Chronic idiopathic constipation: Secondary | ICD-10-CM | POA: Diagnosis not present

## 2022-02-01 DIAGNOSIS — K219 Gastro-esophageal reflux disease without esophagitis: Secondary | ICD-10-CM | POA: Diagnosis not present

## 2022-02-01 DIAGNOSIS — N3281 Overactive bladder: Secondary | ICD-10-CM

## 2022-02-01 DIAGNOSIS — M1990 Unspecified osteoarthritis, unspecified site: Secondary | ICD-10-CM

## 2022-02-01 NOTE — Progress Notes (Signed)
Location:  Other [facility]) Nursing Home Room Number: 759-F Place of Service:  SNF [phone]) Provider:  Carlos American. [name], NP   Patient Care Team: [name], MD as PCP - General [facility] Medicine)  Extended Emergency Contact Information Primary Emergency Contact: Tener,[name] Address: [place]          [place],  [address] [name] of [place] Phone: [phone] Relation: Spouse Secondary Emergency Contact: [place] Mobile Phone: [phone] Relation: Son  Code Status:  DNR Goals of care: Advanced Directive information    02/01/2022    2:47 PM  Advanced Directives  Does Patient Have a Medical Advance Directive? Yes  Type of Advance Directive Out of facility DNR (pink MOST or yellow form)  Does patient want to make changes to medical advance directive? No - Patient declined  Pre-existing out of facility DNR order (yellow form or pink MOST form) Yellow form placed in chart (order not valid for inpatient use)     Chief Complaint  Patient presents with   Medical Management of Chronic Issues    Routine visit. Discuss need for additional covid boosters, shingrix, DEXA, AWV, and td/tdap or post pone if patient refuses or is not a candidate     HPI:  Pt is a 88 y.o. female seen today for medical management of chronic diseases.  Pt with hx of dementia, anemia, OA, OAB, GERD, altered sleep cycle, constipation.  She there has been no recent issues per nursing. Pt without any concerns or complaints today Limited due to her dementia.  Weight has been stable, recent gain with supplements    Past Medical History:  Diagnosis Date   Arthritis    Chest pain    a. 05/2015 - post-prandial.   GERD (gastroesophageal reflux disease)    a. takes Omeprazole daily, prev seen by D. Brodie.   Heart murmur    a. 05/2013 Echo: EF 55-60%, gr2 DD, mild AI/MR.   History of blood transfusion    no abnormal reaction noted    History of migraine headaches    a. last 10/2013    Iron deficiency anemia    Joint pain    PONV (postoperative nausea and vomiting)    Rectocele 2011   Past Surgical History:  Procedure Laterality Date   cataract surgery  Bilateral    R 03/2001; L 05/2001.   COLONOSCOPY     HYSTEROSCOPY  11/1999   w/ resection of endometrial polyp by uterine curetting.   INTRAMEDULLARY (IM) NAIL INTERTROCHANTERIC Right 07/15/2020   Procedure: INTRAMEDULLARY (IM) NAIL INTERTROCHANTRIC;  Surgeon: Thornton Park, MD;  Location: [facility];  Service: Orthopedics;  Laterality: Right;   JOINT REPLACEMENT Bilateral R-9/04, L- 8/05   Total Knee Arthroplasties.   KYPHOPLASTY N/A 04/10/2019   Procedure: KYPHOPLASTY;  Surgeon: Hessie Knows, MD;  Location: [facility];  Service: Orthopedics;  Laterality: N/A;   REVERSE SHOULDER ARTHROPLASTY Right 11/26/2013   Procedure: RIGHT REVERSE SHOULDER ARTHROPLASTY;  Surgeon: Marin Shutter, MD;  Location: [place];  Service: Orthopedics;  Laterality: Right;   SHOULDER ARTHROSCOPY Right    THYROIDECTOMY  at age 25   TONSILLECTOMY      Allergies  Allergen Reactions   Sulfonamide Derivatives Hives   Sulfa Antibiotics Other (See Comments) and Rash    Outpatient Encounter Medications as of 02/01/2022  Medication Sig   acetaminophen (TYLENOL) 500 MG tablet Take 1,000 mg by mouth 3 (three) times daily.   bismuth subsalicylate (PEPTO BISMOL) 262 MG/15ML suspension Take 30 mLs by  mouth every 4 (four) hours as needed.   cholecalciferol (VITAMIN D) 1000 units tablet Take 1,000 Units by mouth daily.   ferrous sulfate 325 (65 FE) MG tablet Take 1 tablet (325 mg total) by mouth every other day.   mirabegron ER (MYRBETRIQ) 25 MG TB24 tablet Take 25 mg by mouth daily.   mirtazapine (REMERON) 7.5 MG tablet Take 7.5 mg by mouth at bedtime.   polyethylene glycol (MIRALAX / GLYCOLAX) 17 g packet Take 17 g by mouth daily.   saccharomyces boulardii (FLORASTOR) 250 MG capsule Take 250 mg by mouth daily.   UNABLE TO FIND Take 4 oz by mouth every  evening. Med Name: Medpass   [DISCONTINUED] mirtazapine (REMERON) 15 MG tablet Take 15 mg by mouth at bedtime.   No facility-administered encounter medications on file as of 02/01/2022.    Review of Systems  Constitutional:  Negative for activity change, appetite change, fatigue and unexpected weight change.  HENT:  Negative for congestion and hearing loss.   Eyes: Negative.   Respiratory:  Negative for cough and shortness of breath.   Cardiovascular:  Negative for chest pain, palpitations and leg swelling.  Gastrointestinal:  Negative for abdominal pain, constipation and diarrhea.  Genitourinary:  Negative for difficulty urinating and dysuria.  Musculoskeletal:  Negative for arthralgias and myalgias.  Skin:  Negative for color change and wound.  Neurological:  Negative for dizziness and weakness.  Psychiatric/Behavioral:  Negative for agitation, behavioral problems and confusion.     Immunization History  Administered Date(s) Administered   Covid-19, Mrna,Vaccine(Spikevax)35yr and older 12/29/2021   Influenza-Unspecified 12/05/2021   Pneumococcal Conjugate-13 03/31/2014   Pneumococcal Polysaccharide-23 04/17/2016   Pneumococcal-Unspecified 02/19/2001   Tdap 11/13/2011   Zoster Recombinat (Shingrix) 10/21/2019   Pertinent  Health Maintenance Due  Topic Date Due   DEXA SCAN  Never done   INFLUENZA VACCINE  Completed      06/21/2021    6:56 PM 06/22/2021    8:36 AM 06/22/2021    1:00 PM 06/22/2021    9:40 PM 06/27/2021   11:48 PM  Fall Risk  Patient Fall Risk Level High fall risk High fall risk High fall risk High fall risk High fall risk   Functional Status Survey:    Vitals:   02/01/22 1447  BP: 125/71  Pulse: 77  Weight: 113 lb (51.3 kg)  Height: '5\' 5"'$  (1.651 m)   Body mass index is 18.8 kg/m. Wt Readings from Last 3 Encounters:  02/01/22 113 lb (51.3 kg)  12/14/21 109 lb 3.2 oz (49.5 kg)  11/07/21 109 lb (49.4 kg)    Physical Exam Constitutional:      General:  She is not in acute distress.    Appearance: She is well-developed. She is not diaphoretic.  HENT:     Head: Normocephalic and atraumatic.     Mouth/Throat:     Pharynx: No oropharyngeal exudate.  Eyes:     Conjunctiva/sclera: Conjunctivae normal.     Pupils: Pupils are equal, round, and reactive to light.  Cardiovascular:     Rate and Rhythm: Normal rate and regular rhythm.     Heart sounds: Murmur heard.  Pulmonary:     Effort: Pulmonary effort is normal.     Breath sounds: Normal breath sounds.  Abdominal:     General: Bowel sounds are normal.     Palpations: Abdomen is soft.  Musculoskeletal:     Cervical back: Normal range of motion and neck supple.     Right lower  leg: No edema.     Left lower leg: No edema.  Skin:    General: Skin is warm and dry.  Neurological:     Mental Status: She is alert. Mental status is at baseline.  Psychiatric:        Mood and Affect: Mood normal.     Labs reviewed: Recent Labs    06/22/21 0651 06/23/21 1018 06/27/21 2350 12/18/21 0000  NA 139 141 139 140  K 3.6 3.5 4.3 4.1  CL 108 107 107 105  CO2 21* 23 28 24*  GLUCOSE 107* 142* 102*  --   BUN 24* 20 24* 24*  CREATININE 0.71 0.81 0.84 0.7  CALCIUM 8.0* 8.5* 8.4* 8.6*   Recent Labs    12/18/21 0000  AST 21  ALT 17  ALKPHOS 97  ALBUMIN 3.7   Recent Labs    06/22/21 0651 06/23/21 1018 06/27/21 2350 12/18/21 0000  WBC 15.2* 8.9 11.4* 7.4  NEUTROABS  --  6.7 8.2* 5,017.00  HGB 8.9* 9.4* 8.6* 10.0*  HCT 27.5* 29.1* 26.8* 29*  MCV 101.9* 99.0 99.3  --   PLT 291 349 460* 331   No results found for: "TSH" No results found for: "HGBA1C" No results found for: "CHOL", "HDL", "LDLCALC", "LDLDIRECT", "TRIG", "CHOLHDL"  Significant Diagnostic Results in last 30 days:  No results found.  Assessment/Plan 1. Vascular dementia with behavior disturbance (HCC) -Stable, no acute changes in cognitive or functional status, continue supportive care with SNF staff  2. Iron  deficiency anemia, unspecified iron deficiency anemia type Hgb stable, continues supplement.   3. Osteoarthritis, unspecified osteoarthritis type, unspecified site -well controlled, on tylenol   4. Chronic idiopathic constipation Well controlled on miralax  5. GERD without esophagitis -no symptoms at this time  6. OAB (overactive bladder) Continues on [employer]. [place], [facility] [phone]

## 2022-02-21 DIAGNOSIS — I7091 Generalized atherosclerosis: Secondary | ICD-10-CM | POA: Diagnosis not present

## 2022-02-21 DIAGNOSIS — B351 Tinea unguium: Secondary | ICD-10-CM | POA: Diagnosis not present

## 2022-03-22 DIAGNOSIS — D509 Iron deficiency anemia, unspecified: Secondary | ICD-10-CM | POA: Diagnosis not present

## 2022-03-22 LAB — BASIC METABOLIC PANEL
BUN: 20 (ref 4–21)
CO2: 28 — AB (ref 13–22)
Chloride: 106 (ref 99–108)
Creatinine: 0.8 (ref 0.5–1.1)
Glucose: 120
Potassium: 4.2 mEq/L (ref 3.5–5.1)
Sodium: 142 (ref 137–147)

## 2022-03-22 LAB — CBC AND DIFFERENTIAL
HCT: 33 — AB (ref 36–46)
Hemoglobin: 11 — AB (ref 12.0–16.0)
Neutrophils Absolute: 6897
Platelets: 348 10*3/uL (ref 150–400)
WBC: 9.5

## 2022-03-22 LAB — COMPREHENSIVE METABOLIC PANEL
Calcium: 9.1 (ref 8.7–10.7)
eGFR: 67

## 2022-03-22 LAB — CBC: RBC: 3.32 — AB (ref 3.87–5.11)

## 2022-03-30 ENCOUNTER — Non-Acute Institutional Stay (SKILLED_NURSING_FACILITY): Payer: Medicare Other | Admitting: Student

## 2022-03-30 ENCOUNTER — Encounter: Payer: Self-pay | Admitting: Student

## 2022-03-30 DIAGNOSIS — M8448XG Pathological fracture, other site, subsequent encounter for fracture with delayed healing: Secondary | ICD-10-CM | POA: Diagnosis not present

## 2022-03-30 DIAGNOSIS — F01518 Vascular dementia, unspecified severity, with other behavioral disturbance: Secondary | ICD-10-CM

## 2022-03-30 DIAGNOSIS — I700 Atherosclerosis of aorta: Secondary | ICD-10-CM

## 2022-03-30 DIAGNOSIS — S72141A Displaced intertrochanteric fracture of right femur, initial encounter for closed fracture: Secondary | ICD-10-CM | POA: Diagnosis not present

## 2022-03-30 DIAGNOSIS — S32010A Wedge compression fracture of first lumbar vertebra, initial encounter for closed fracture: Secondary | ICD-10-CM

## 2022-03-30 DIAGNOSIS — M810 Age-related osteoporosis without current pathological fracture: Secondary | ICD-10-CM | POA: Diagnosis not present

## 2022-03-30 NOTE — Progress Notes (Signed)
Location:  Other [place].  Nursing Home Room Number: [place]:  SNF [phone]) Provider:  [name], MD  Patient Care Team: [name], MD as PCP - General [facility])  Extended Emergency Contact Information Primary Emergency Contact: Garside,[name] Address: [address]          [place], [place] [address] [name] of [place] Phone: [phone] Relation: Spouse Secondary Emergency Contact: [place] Mobile Phone: [phone] Relation: Son  Code Status:  DNR Goals of care: Advanced Directive information    03/30/2022   10:17 AM  Advanced Directives  Does Patient Have a Medical Advance Directive? Yes  Type of Advance Directive Out of facility DNR (pink MOST or yellow form)  Does patient want to make changes to medical advance directive? No - Patient declined     Chief Complaint  Patient presents with   Medical Management of Chronic Issues    Medical Management of Chronic Issues.     HPI:  Pt is a 89 y.o. female seen today for medical management of chronic diseases.  Nursing states patient continues to have "cat naps" throughout the day. Sometimes does not sleep through the night despite medications. She remains redirectable. No recent falls. Weight stable.   Patient is awake and oriented to self, however, cannot give her birth date, day of the week, or year. She states she has no concerns at the same time. When introduced as the doctor, she says, "Oh good, the baby is on the way. I'm so glad I get to see the doctor while I'm pregnant. Do you see pregnant patients?"   Past Medical History:  Diagnosis Date   Arthritis    Chest pain    a. 05/2015 - post-prandial.   GERD (gastroesophageal reflux disease)    a. takes Omeprazole daily, prev seen by D. Brodie.   Heart murmur    a. 05/2013 Echo: EF 55-60%, gr2 DD, mild AI/MR.   History of blood transfusion    no abnormal reaction noted    History of migraine headaches    a. last  10/2013   Iron deficiency anemia    Joint pain    PONV (postoperative nausea and vomiting)    Rectocele 2011   Past Surgical History:  Procedure Laterality Date   cataract surgery  Bilateral    R 03/2001; L 05/2001.   COLONOSCOPY     HYSTEROSCOPY  11/1999   w/ resection of endometrial polyp by uterine curetting.   INTRAMEDULLARY (IM) NAIL INTERTROCHANTERIC Right 07/15/2020   Procedure: INTRAMEDULLARY (IM) NAIL INTERTROCHANTRIC;  Surgeon: Thornton Park, MD;  Location: [facility];  Service: Orthopedics;  Laterality: Right;   JOINT REPLACEMENT Bilateral R-9/04, L- 8/05   Total Knee Arthroplasties.   KYPHOPLASTY N/A 04/10/2019   Procedure: KYPHOPLASTY;  Surgeon: Hessie Knows, MD;  Location: [facility];  Service: Orthopedics;  Laterality: N/A;   REVERSE SHOULDER ARTHROPLASTY Right 11/26/2013   Procedure: RIGHT REVERSE SHOULDER ARTHROPLASTY;  Surgeon: Marin Shutter, MD;  Location: [place];  Service: Orthopedics;  Laterality: Right;   SHOULDER ARTHROSCOPY Right    THYROIDECTOMY  at age 71   TONSILLECTOMY      Allergies  Allergen Reactions   Sulfonamide Derivatives Hives   Sulfa Antibiotics Other (See Comments) and Rash    Outpatient Encounter Medications as of 03/30/2022  Medication Sig   acetaminophen (TYLENOL) 500 MG tablet Take 1,000 mg by mouth 3 (three) times daily.   bismuth subsalicylate (PEPTO BISMOL) 262 MG/15ML suspension Take  30 mLs by mouth every 4 (four) hours as needed.   cholecalciferol (VITAMIN D) 1000 units tablet Take 1,000 Units by mouth daily.   ferrous sulfate 325 (65 FE) MG tablet Take 1 tablet (325 mg total) by mouth every other day.   mirabegron ER (MYRBETRIQ) 25 MG TB24 tablet Take 25 mg by mouth daily.   mirtazapine (REMERON) 7.5 MG tablet Take 7.5 mg by mouth at bedtime.   polyethylene glycol (MIRALAX / GLYCOLAX) 17 g packet Take 17 g by mouth every other day.   saccharomyces boulardii (FLORASTOR) 250 MG capsule Take 250 mg by mouth daily.   UNABLE TO FIND Take 4  oz by mouth every evening. Med Name: Medpass   No facility-administered encounter medications on file as of 03/30/2022.    Review of Systems  Immunization History  Administered Date(s) Administered   Covid-19, Mrna,Vaccine(Spikevax)20yr and older 12/29/2021   Influenza-Unspecified 12/05/2021   Pneumococcal Conjugate-13 03/31/2014   Pneumococcal Polysaccharide-23 04/17/2016   Pneumococcal-Unspecified 02/19/2001   Tdap 11/13/2011   Unspecified SARS-COV-2 Vaccination 03/06/2019, 04/03/2019, 01/05/2020, 07/07/2020, 07/18/2021   Zoster Recombinat (Shingrix) 04/30/2008, 06/09/2019, 10/21/2019   Pertinent  Health Maintenance Due  Topic Date Due   DEXA SCAN  Never done   INFLUENZA VACCINE  Completed      06/21/2021    6:56 PM 06/22/2021    8:36 AM 06/22/2021    1:00 PM 06/22/2021    9:40 PM 06/27/2021   11:48 PM  Fall Risk  (RETIRED) Patient Fall Risk Level High fall risk High fall risk High fall risk High fall risk High fall risk   Functional Status Survey:    Vitals:   03/30/22 1009  BP: 111/60  Pulse: 67  Resp: 18  Temp: (!) 97.5 F (36.4 C)  SpO2: 95%  Weight: 115 lb 3.2 oz (52.3 kg)  Height: 5' 5"$  (1.651 m)   Body mass index is 19.17 kg/m. Physical Exam Vitals reviewed.  Cardiovascular:     Rate and Rhythm: Normal rate.     Pulses: Normal pulses.  Pulmonary:     Effort: Pulmonary effort is normal.  Abdominal:     General: Abdomen is flat.  Skin:    General: Skin is warm and dry.  Neurological:     Mental Status: She is alert. Mental status is at baseline. She is disoriented.     Labs reviewed: Recent Labs    06/22/21 0651 06/23/21 1018 06/27/21 2350 12/18/21 0000 03/22/22 0000  NA 139 141 139 140 142  K 3.6 3.5 4.3 4.1 4.2  CL 108 107 107 105 106  CO2 21* 23 28 24* 28*  GLUCOSE 107* 142* 102*  --   --   BUN 24* 20 24* 24* 20  CREATININE 0.71 0.81 0.84 0.7 0.8  CALCIUM 8.0* 8.5* 8.4* 8.6* 9.1   Recent Labs    12/18/21 0000  AST 21  ALT 17   ALKPHOS 97  ALBUMIN 3.7   Recent Labs    06/22/21 0651 06/22/21 0651 06/23/21 1018 06/27/21 2350 12/18/21 0000 03/22/22 0000  WBC 15.2*  --  8.9 11.4* 7.4 9.5  NEUTROABS  --    < > 6.7 8.2* 5,017.00 6,897.00  HGB 8.9*  --  9.4* 8.6* 10.0* 11.0*  HCT 27.5*  --  29.1* 26.8* 29* 33*  MCV 101.9*  --  99.0 99.3  --   --   PLT 291  --  349 460* 331 348   < > = values in this interval not  displayed.   No results found for: "TSH" No results found for: "HGBA1C" No results found for: "CHOL", "HDL", "LDLCALC", "LDLDIRECT", "TRIG", "CHOLHDL"  Significant Diagnostic Results in last 30 days:  No results found.  Assessment/Plan 1. Hardening of the aorta (main artery of the heart) (HCC) BP well-controlled without medications. Continue to monitor.   2. Vascular dementia with behavior disturbance [facility]) Patient is oriented only to self. She has delusions but is redirectable. Continue Mirtazapine 7.5 mg nightly to help with sleep-wake cycle. Weigh stable, however, dependent in transfers and self care. Continue NH level of care.   3. Closed intertrochanteric fracture of hip, right, initial encounter ([place]) 07/14/2020 DOI. Mobility limitations since fracture. PT/OT as indicated.   4. Compression fracture of L1 vertebra, initial encounter ([place]) DOI 04/11/2019 due to underlying osteoporosis. No pain at this time CTM.   5. Sacral insufficiency fracture with delayed healing, subsequent encounter DOI 04/09/2019 likely due to underlying osteoporosis. Continue Vitamin D supplementation .  6. Age related osteoporosis, unspecified pathological fracture presence Continue Vitamin D 1000 IU daliy.     Family/ staff Communication: nursing  Labs/tests ordered:  routine labs cbc and bmp q6 mo. If CBC stable, will consider discontinuing supplemental Iron.

## 2022-04-03 ENCOUNTER — Encounter: Payer: Self-pay | Admitting: Nurse Practitioner

## 2022-04-03 ENCOUNTER — Non-Acute Institutional Stay (SKILLED_NURSING_FACILITY): Payer: Medicare Other | Admitting: Nurse Practitioner

## 2022-04-03 DIAGNOSIS — Z66 Do not resuscitate: Secondary | ICD-10-CM

## 2022-04-03 DIAGNOSIS — Z711 Person with feared health complaint in whom no diagnosis is made: Secondary | ICD-10-CM | POA: Diagnosis not present

## 2022-04-03 DIAGNOSIS — F01518 Vascular dementia, unspecified severity, with other behavioral disturbance: Secondary | ICD-10-CM | POA: Diagnosis not present

## 2022-04-03 NOTE — Progress Notes (Unsigned)
Location:  Other Nursing Home Room Number: [id number] A Place of Service:  SNF (31) Provider:  Carlos American. [name], NP   Patient Care Team: Melissa Shorter, MD as PCP - General [facility])  Extended Emergency Contact Information Primary Emergency Contact: Melissa,[name] Address: [address]          [place], [place] [address] Johnnette [name] of [place] Phone: [phone] Relation: Spouse Secondary Emergency Contact: [place] Mobile Phone: [phone] Relation: Son  Code Status:  DNR Goals of care: Advanced Directive information    04/03/2022   10:18 AM  Advanced Directives  Does Patient Have a Medical Advance Directive? Yes  Type of Advance Directive Out of facility DNR (pink MOST or yellow form)  Does patient want to make changes to medical advance directive? No - Patient declined     Chief Complaint  Patient presents with   Acute Visit    Not feeling well     HPI:  Pt is a 89 y.o. female seen today for an acute visit for    Past Medical History:  Diagnosis Date   Arthritis    Chest pain    a. 05/2015 - post-prandial.   GERD (gastroesophageal reflux disease)    a. takes Omeprazole daily, prev seen by D. Brodie.   Heart murmur    a. 05/2013 Echo: EF 55-60%, gr2 DD, mild AI/MR.   History of blood transfusion    no abnormal reaction noted    History of migraine headaches    a. last 10/2013   Iron deficiency anemia    Joint pain    PONV (postoperative nausea and vomiting)    Rectocele 2011   Past Surgical History:  Procedure Laterality Date   cataract surgery  Bilateral    R 03/2001; L 05/2001.   COLONOSCOPY     HYSTEROSCOPY  11/1999   w/ resection of endometrial polyp by uterine curetting.   INTRAMEDULLARY (IM) NAIL INTERTROCHANTERIC Right 07/15/2020   Procedure: INTRAMEDULLARY (IM) NAIL INTERTROCHANTRIC;  Surgeon: Thornton Park, MD;  Location: [facility];  Service: Orthopedics;  Laterality: Right;   JOINT REPLACEMENT Bilateral R-9/04, L- 8/05   Total Knee  Arthroplasties.   KYPHOPLASTY N/A 04/10/2019   Procedure: KYPHOPLASTY;  Surgeon: Hessie Knows, MD;  Location: [facility];  Service: Orthopedics;  Laterality: N/A;   REVERSE SHOULDER ARTHROPLASTY Right 11/26/2013   Procedure: RIGHT REVERSE SHOULDER ARTHROPLASTY;  Surgeon: Marin Shutter, MD;  Location: [place];  Service: Orthopedics;  Laterality: Right;   SHOULDER ARTHROSCOPY Right    THYROIDECTOMY  at age 61   TONSILLECTOMY      Allergies  Allergen Reactions   Sulfonamide Derivatives Hives   Sulfa Antibiotics Other (See Comments) and Rash    Outpatient Encounter Medications as of 04/03/2022  Medication Sig   acetaminophen (TYLENOL) 500 MG tablet Take 1,000 mg by mouth 3 (three) times daily.   bismuth subsalicylate (PEPTO BISMOL) 262 MG/15ML suspension Take 30 mLs by mouth every 4 (four) hours as needed.   cholecalciferol (VITAMIN D) 1000 units tablet Take 1,000 Units by mouth daily.   ferrous sulfate 325 (65 FE) MG tablet Take 1 tablet (325 mg total) by mouth every other day.   mirabegron ER (MYRBETRIQ) 25 MG TB24 tablet Take 25 mg by mouth daily.   mirtazapine (REMERON) 7.5 MG tablet Take 7.5 mg by mouth at bedtime.   polyethylene glycol (MIRALAX / GLYCOLAX) 17 g packet Take 17 g by mouth every other day.   saccharomyces boulardii (FLORASTOR) 250 MG capsule  Take 250 mg by mouth daily.   UNABLE TO FIND Take 4 oz by mouth every evening. Med Name: Medpass   No facility-administered encounter medications on file as of 04/03/2022.    Review of Systems  Immunization History  Administered Date(s) Administered   Covid-19, Mrna,Vaccine(Spikevax)61yr and older 12/29/2021   Influenza-Unspecified 12/05/2021   Pneumococcal Conjugate-13 03/31/2014   Pneumococcal Polysaccharide-23 04/17/2016   Pneumococcal-Unspecified 02/19/2001   Tdap 11/13/2011   Unspecified SARS-COV-2 Vaccination 03/06/2019, 04/03/2019, 01/05/2020, 07/07/2020, 07/18/2021   Zoster Recombinat (Shingrix) 04/30/2008, 06/09/2019,  10/21/2019   Pertinent  Health Maintenance Due  Topic Date Due   DEXA SCAN  Never done   INFLUENZA VACCINE  Completed      06/21/2021    6:56 PM 06/22/2021    8:36 AM 06/22/2021    1:00 PM 06/22/2021    9:40 PM 06/27/2021   11:48 PM  Fall Risk  (RETIRED) Patient Fall Risk Level High fall risk High fall risk High fall risk High fall risk High fall risk   Functional Status Survey:    Vitals:   04/03/22 1017  BP: 129/68  Pulse: 76  Resp: 20  Temp: 97.8 F (36.6 C)  SpO2: 94%  Weight: 115 lb (52.2 kg)  Height: 5' 5"$  (1.651 m)   Body mass index is 19.14 kg/m. Physical Exam  Labs reviewed: Recent Labs    06/22/21 0651 06/23/21 1018 06/27/21 2350 12/18/21 0000 03/22/22 0000  NA 139 141 139 140 142  K 3.6 3.5 4.3 4.1 4.2  CL 108 107 107 105 106  CO2 21* 23 28 24* 28*  GLUCOSE 107* 142* 102*  --   --   BUN 24* 20 24* 24* 20  CREATININE 0.71 0.81 0.84 0.7 0.8  CALCIUM 8.0* 8.5* 8.4* 8.6* 9.1   Recent Labs    12/18/21 0000  AST 21  ALT 17  ALKPHOS 97  ALBUMIN 3.7   Recent Labs    06/22/21 0651 06/22/21 0651 06/23/21 1018 06/27/21 2350 12/18/21 0000 03/22/22 0000  WBC 15.2*  --  8.9 11.4* 7.4 9.5  NEUTROABS  --    < > 6.7 8.2* 5,017.00 6,897.00  HGB 8.9*  --  9.4* 8.6* 10.0* 11.0*  HCT 27.5*  --  29.1* 26.8* 29* 33*  MCV 101.9*  --  99.0 99.3  --   --   PLT 291  --  349 460* 331 348   < > = values in this interval not displayed.   No results found for: "TSH" No results found for: "HGBA1C" No results found for: "CHOL", "HDL", "LDLCALC", "LDLDIRECT", "TRIG", "CHOLHDL"  Significant Diagnostic Results in last 30 days:  No results found.  Assessment/Plan 1. Do not resuscitate ***    Family/ staff Communication: ***  Labs/tests ordered:  ***

## 2022-05-01 DIAGNOSIS — B351 Tinea unguium: Secondary | ICD-10-CM | POA: Diagnosis not present

## 2022-05-01 DIAGNOSIS — I7091 Generalized atherosclerosis: Secondary | ICD-10-CM | POA: Diagnosis not present

## 2022-05-08 DIAGNOSIS — B950 Streptococcus, group A, as the cause of diseases classified elsewhere: Secondary | ICD-10-CM | POA: Diagnosis not present

## 2022-05-08 DIAGNOSIS — Z20818 Contact with and (suspected) exposure to other bacterial communicable diseases: Secondary | ICD-10-CM | POA: Diagnosis not present

## 2022-05-22 ENCOUNTER — Non-Acute Institutional Stay (SKILLED_NURSING_FACILITY): Payer: Medicare Other | Admitting: Nurse Practitioner

## 2022-05-22 ENCOUNTER — Encounter: Payer: Self-pay | Admitting: Nurse Practitioner

## 2022-05-22 DIAGNOSIS — M1990 Unspecified osteoarthritis, unspecified site: Secondary | ICD-10-CM | POA: Diagnosis not present

## 2022-05-22 DIAGNOSIS — M810 Age-related osteoporosis without current pathological fracture: Secondary | ICD-10-CM | POA: Diagnosis not present

## 2022-05-22 DIAGNOSIS — K5904 Chronic idiopathic constipation: Secondary | ICD-10-CM | POA: Diagnosis not present

## 2022-05-22 DIAGNOSIS — N3281 Overactive bladder: Secondary | ICD-10-CM | POA: Diagnosis not present

## 2022-05-22 DIAGNOSIS — D509 Iron deficiency anemia, unspecified: Secondary | ICD-10-CM | POA: Diagnosis not present

## 2022-05-22 DIAGNOSIS — F01518 Vascular dementia, unspecified severity, with other behavioral disturbance: Secondary | ICD-10-CM

## 2022-05-22 NOTE — Progress Notes (Signed)
Location:  Other Nursing Home Room Number: [place]:  SNF ([phone])  Unk Lightning, [place], MD  Patient Care Team: [name], MD as PCP - General [facility])  Extended Emergency Contact Information Primary Emergency Contact: Liscano,[name] Address: [address]          [place], [place] [address] [name] of [place] Phone: [phone] Relation: Spouse Secondary Emergency Contact: [place] Mobile Phone: [phone] Relation: Son  Goals of care: Advanced Directive information    05/22/2022    2:11 PM  Advanced Directives  Does Patient Have a Medical Advance Directive? Yes  Type of Advance Directive Out of facility DNR (pink MOST or yellow form)  Does patient want to make changes to medical advance directive? No - Patient declined     Chief Complaint  Patient presents with   Medical Management of Chronic Issues    Medical Management of Chronic Issues.     HPI:  Pt is a 89 y.o. female seen today for medical management of chronic disease.  Pt with hx of dementia, anemia, OA, overactive bladder, constipation at [facility] for long term care. She is doing well per staff and nursing has no concerns. She eats well. No behaviors.   Past Medical History:  Diagnosis Date   Arthritis    Chest pain    a. 05/2015 - post-prandial.   GERD (gastroesophageal reflux disease)    a. takes Omeprazole daily, prev seen by D. Brodie.   Heart murmur    a. 05/2013 Echo: EF 55-60%, gr2 DD, mild AI/MR.   History of blood transfusion    no abnormal reaction noted    History of migraine headaches    a. last 10/2013   Iron deficiency anemia    Joint pain    PONV (postoperative nausea and vomiting)    Rectocele 2011   Past Surgical History:  Procedure Laterality Date   cataract surgery  Bilateral    R 03/2001; L 05/2001.   COLONOSCOPY     HYSTEROSCOPY  11/1999   w/ resection of endometrial polyp by uterine curetting.   INTRAMEDULLARY (IM) NAIL  INTERTROCHANTERIC Right 07/15/2020   Procedure: INTRAMEDULLARY (IM) NAIL INTERTROCHANTRIC;  Surgeon: Thornton Park, MD;  Location: [facility];  Service: Orthopedics;  Laterality: Right;   JOINT REPLACEMENT Bilateral R-9/04, L- 8/05   Total Knee Arthroplasties.   KYPHOPLASTY N/A 04/10/2019   Procedure: KYPHOPLASTY;  Surgeon: Hessie Knows, MD;  Location: [facility];  Service: Orthopedics;  Laterality: N/A;   REVERSE SHOULDER ARTHROPLASTY Right 11/26/2013   Procedure: RIGHT REVERSE SHOULDER ARTHROPLASTY;  Surgeon: Marin Shutter, MD;  Location: [place];  Service: Orthopedics;  Laterality: Right;   SHOULDER ARTHROSCOPY Right    THYROIDECTOMY  at age 75   TONSILLECTOMY      Allergies  Allergen Reactions   Sulfonamide Derivatives Hives   Sulfa Antibiotics Other (See Comments) and Rash    Outpatient Encounter Medications as of 05/22/2022  Medication Sig   acetaminophen (TYLENOL) 500 MG tablet Take 1,000 mg by mouth 3 (three) times daily.   bismuth subsalicylate (PEPTO BISMOL) 262 MG/15ML suspension Take 30 mLs by mouth every 4 (four) hours as needed.   cholecalciferol (VITAMIN D) 1000 units tablet Take 1,000 Units by mouth daily.   ferrous sulfate 325 (65 FE) MG tablet Take 1 tablet (325 mg total) by mouth every other day.   mirabegron ER (MYRBETRIQ) 25 MG TB24 tablet Take 25 mg by mouth daily.   mirtazapine (REMERON) 7.5 MG  tablet Take 7.5 mg by mouth at bedtime.   polyethylene glycol (MIRALAX / GLYCOLAX) 17 g packet Take 17 g by mouth every other day.   saccharomyces boulardii (FLORASTOR) 250 MG capsule Take 250 mg by mouth daily.   UNABLE TO FIND Take 4 oz by mouth every evening. Med Name: Medpass   No facility-administered encounter medications on file as of 05/22/2022.    Review of Systems  Unable to perform ROS: Dementia     Immunization History  Administered Date(s) Administered   Covid-19, Mrna,Vaccine(Spikevax)9yrs and older 12/29/2021   Influenza-Unspecified 12/05/2021    Pneumococcal Conjugate-13 03/31/2014   Pneumococcal Polysaccharide-23 04/17/2016   Pneumococcal-Unspecified 02/19/2001   Tdap 11/13/2011   Unspecified SARS-COV-2 Vaccination 03/06/2019, 04/03/2019, 01/05/2020, 07/07/2020, 07/18/2021   Zoster Recombinat (Shingrix) 04/30/2008, 06/09/2019, 10/21/2019   Pertinent  Health Maintenance Due  Topic Date Due   DEXA SCAN  Never done   INFLUENZA VACCINE  09/20/2022      06/21/2021    6:56 PM 06/22/2021    8:36 AM 06/22/2021    1:00 PM 06/22/2021    9:40 PM 06/27/2021   11:48 PM  Fall Risk  (RETIRED) Patient Fall Risk Level High fall risk High fall risk High fall risk High fall risk High fall risk   Functional Status Survey:    Vitals:   05/22/22 1406  BP: 115/71  Pulse: 80  Resp: 18  Temp: (!) 97 F (36.1 C)  SpO2: 97%  Weight: 117 lb (53.1 kg)  Height: 5\' 5"  (1.651 m)   Body mass index is 19.47 kg/m. Wt Readings from Last 3 Encounters:  05/22/22 117 lb (53.1 kg)  04/03/22 115 lb (52.2 kg)  03/30/22 115 lb 3.2 oz (52.3 kg)    Physical Exam Constitutional:      General: She is not in acute distress.    Appearance: She is well-developed. She is not diaphoretic.  HENT:     Head: Normocephalic and atraumatic.     Mouth/Throat:     Pharynx: No oropharyngeal exudate.  Eyes:     Conjunctiva/sclera: Conjunctivae normal.     Pupils: Pupils are equal, round, and reactive to light.  Cardiovascular:     Rate and Rhythm: Normal rate and regular rhythm.     Heart sounds: Normal heart sounds.  Pulmonary:     Effort: Pulmonary effort is normal.     Breath sounds: Normal breath sounds.  Abdominal:     General: Bowel sounds are normal.     Palpations: Abdomen is soft.  Musculoskeletal:     Cervical back: Normal range of motion and neck supple.     Right lower leg: No edema.     Left lower leg: No edema.  Skin:    General: Skin is warm and dry.  Neurological:     Mental Status: She is alert. Mental status is at baseline.     Gait: Gait  abnormal.  Psychiatric:        Mood and Affect: Mood normal.     Labs reviewed: Recent Labs    06/22/21 0651 06/23/21 1018 06/27/21 2350 12/18/21 0000 03/22/22 0000  NA 139 141 139 140 142  K 3.6 3.5 4.3 4.1 4.2  CL 108 107 107 105 106  CO2 21* 23 28 24* 28*  GLUCOSE 107* 142* 102*  --   --   BUN 24* 20 24* 24* 20  CREATININE 0.71 0.81 0.84 0.7 0.8  CALCIUM 8.0* 8.5* 8.4* 8.6* 9.1   Recent Labs  12/18/21 0000  AST 21  ALT 17  ALKPHOS 97  ALBUMIN 3.7   Recent Labs    06/22/21 0651 06/22/21 0651 06/23/21 1018 06/27/21 2350 12/18/21 0000 03/22/22 0000  WBC 15.2*  --  8.9 11.4* 7.4 9.5  NEUTROABS  --    < > 6.7 8.2* 5,017.00 6,897.00  HGB 8.9*  --  9.4* 8.6* 10.0* 11.0*  HCT 27.5*  --  29.1* 26.8* 29* 33*  MCV 101.9*  --  99.0 99.3  --   --   PLT 291  --  349 460* 331 348   < > = values in this interval not displayed.   No results found for: "TSH" No results found for: "HGBA1C" No results found for: "CHOL", "HDL", "LDLCALC", "LDLDIRECT", "TRIG", "CHOLHDL"  Significant Diagnostic Results in last 30 days:  No results found.  Assessment/Plan 1. Iron deficiency anemia, unspecified iron deficiency anemia type -hgb stable on last labs, continues supplement  2. Osteoarthritis, unspecified osteoarthritis type, unspecified site Stable on tylenol TID.  3. Chronic idiopathic constipation -well controlled on current regimen.   4. OAB (overactive bladder) Continues on myrbetriq 25 mg daily.   5. Vascular dementia with behavior disturbance -advanced, stable, no acute changes in cognitive or functional status, continue supportive care.   6. Age related osteoporosis, unspecified pathological fracture presence -continues on vit d supplement.     Carlos American. [place], [facility] [phone]

## 2022-05-29 DIAGNOSIS — Z23 Encounter for immunization: Secondary | ICD-10-CM | POA: Diagnosis not present

## 2022-06-20 DIAGNOSIS — D509 Iron deficiency anemia, unspecified: Secondary | ICD-10-CM | POA: Diagnosis not present

## 2022-06-20 LAB — BASIC METABOLIC PANEL
BUN: 20 (ref 4–21)
CO2: 25 — AB (ref 13–22)
Chloride: 107 (ref 99–108)
Creatinine: 0.7 (ref 0.5–1.1)
Glucose: 71
Potassium: 4 mEq/L (ref 3.5–5.1)
Sodium: 142 (ref 137–147)

## 2022-06-20 LAB — CBC AND DIFFERENTIAL
HCT: 29 — AB (ref 36–46)
Hemoglobin: 9.6 — AB (ref 12.0–16.0)
Neutrophils Absolute: 2384
Platelets: 313 10*3/uL (ref 150–400)
WBC: 5.8

## 2022-06-20 LAB — COMPREHENSIVE METABOLIC PANEL
Calcium: 8.2 — AB (ref 8.7–10.7)
eGFR: 79

## 2022-06-20 LAB — CBC: RBC: 2.91 — AB (ref 3.87–5.11)

## 2022-06-22 ENCOUNTER — Other Ambulatory Visit: Payer: Self-pay

## 2022-06-22 ENCOUNTER — Emergency Department: Payer: Medicare Other

## 2022-06-22 ENCOUNTER — Encounter: Payer: Self-pay | Admitting: Internal Medicine

## 2022-06-22 ENCOUNTER — Inpatient Hospital Stay: Payer: Medicare Other

## 2022-06-22 ENCOUNTER — Inpatient Hospital Stay
Admission: EM | Admit: 2022-06-22 | Discharge: 2022-06-25 | DRG: 064 | Disposition: A | Discharge status: Skilled Nursing Facility | Payer: Medicare Other | Source: Skilled Nursing Facility | Attending: Internal Medicine | Admitting: Internal Medicine

## 2022-06-22 DIAGNOSIS — Z993 Dependence on wheelchair: Secondary | ICD-10-CM

## 2022-06-22 DIAGNOSIS — I5A Non-ischemic myocardial injury (non-traumatic): Secondary | ICD-10-CM | POA: Diagnosis present

## 2022-06-22 DIAGNOSIS — E890 Postprocedural hypothyroidism: Secondary | ICD-10-CM | POA: Diagnosis present

## 2022-06-22 DIAGNOSIS — Z681 Body mass index (BMI) 19 or less, adult: Secondary | ICD-10-CM | POA: Diagnosis not present

## 2022-06-22 DIAGNOSIS — Z8249 Family history of ischemic heart disease and other diseases of the circulatory system: Secondary | ICD-10-CM | POA: Diagnosis not present

## 2022-06-22 DIAGNOSIS — N390 Urinary tract infection, site not specified: Secondary | ICD-10-CM | POA: Diagnosis present

## 2022-06-22 DIAGNOSIS — N3000 Acute cystitis without hematuria: Secondary | ICD-10-CM | POA: Diagnosis not present

## 2022-06-22 DIAGNOSIS — R2681 Unsteadiness on feet: Secondary | ICD-10-CM | POA: Diagnosis not present

## 2022-06-22 DIAGNOSIS — N3281 Overactive bladder: Secondary | ICD-10-CM | POA: Diagnosis not present

## 2022-06-22 DIAGNOSIS — M810 Age-related osteoporosis without current pathological fracture: Secondary | ICD-10-CM | POA: Diagnosis not present

## 2022-06-22 DIAGNOSIS — Z882 Allergy status to sulfonamides status: Secondary | ICD-10-CM

## 2022-06-22 DIAGNOSIS — Z1611 Resistance to penicillins: Secondary | ICD-10-CM | POA: Diagnosis not present

## 2022-06-22 DIAGNOSIS — R519 Headache, unspecified: Secondary | ICD-10-CM | POA: Diagnosis not present

## 2022-06-22 DIAGNOSIS — I69398 Other sequelae of cerebral infarction: Secondary | ICD-10-CM | POA: Diagnosis not present

## 2022-06-22 DIAGNOSIS — R531 Weakness: Principal | ICD-10-CM

## 2022-06-22 DIAGNOSIS — M1990 Unspecified osteoarthritis, unspecified site: Secondary | ICD-10-CM | POA: Diagnosis not present

## 2022-06-22 DIAGNOSIS — I6350 Cerebral infarction due to unspecified occlusion or stenosis of unspecified cerebral artery: Principal | ICD-10-CM | POA: Diagnosis present

## 2022-06-22 DIAGNOSIS — R4189 Other symptoms and signs involving cognitive functions and awareness: Secondary | ICD-10-CM | POA: Diagnosis not present

## 2022-06-22 DIAGNOSIS — Z9181 History of falling: Secondary | ICD-10-CM | POA: Diagnosis not present

## 2022-06-22 DIAGNOSIS — R404 Transient alteration of awareness: Secondary | ICD-10-CM | POA: Diagnosis not present

## 2022-06-22 DIAGNOSIS — K219 Gastro-esophageal reflux disease without esophagitis: Secondary | ICD-10-CM | POA: Diagnosis present

## 2022-06-22 DIAGNOSIS — R2689 Other abnormalities of gait and mobility: Secondary | ICD-10-CM | POA: Diagnosis not present

## 2022-06-22 DIAGNOSIS — E43 Unspecified severe protein-calorie malnutrition: Secondary | ICD-10-CM | POA: Diagnosis not present

## 2022-06-22 DIAGNOSIS — Z96611 Presence of right artificial shoulder joint: Secondary | ICD-10-CM | POA: Diagnosis not present

## 2022-06-22 DIAGNOSIS — I10 Essential (primary) hypertension: Secondary | ICD-10-CM | POA: Diagnosis present

## 2022-06-22 DIAGNOSIS — R29898 Other symptoms and signs involving the musculoskeletal system: Secondary | ICD-10-CM | POA: Diagnosis not present

## 2022-06-22 DIAGNOSIS — R400 Somnolence: Secondary | ICD-10-CM | POA: Diagnosis not present

## 2022-06-22 DIAGNOSIS — I639 Cerebral infarction, unspecified: Secondary | ICD-10-CM | POA: Diagnosis not present

## 2022-06-22 DIAGNOSIS — Z741 Need for assistance with personal care: Secondary | ICD-10-CM | POA: Diagnosis not present

## 2022-06-22 DIAGNOSIS — F0150 Vascular dementia without behavioral disturbance: Secondary | ICD-10-CM | POA: Diagnosis not present

## 2022-06-22 DIAGNOSIS — Z79899 Other long term (current) drug therapy: Secondary | ICD-10-CM | POA: Diagnosis not present

## 2022-06-22 DIAGNOSIS — G8194 Hemiplegia, unspecified affecting left nondominant side: Secondary | ICD-10-CM | POA: Diagnosis present

## 2022-06-22 DIAGNOSIS — Z7401 Bed confinement status: Secondary | ICD-10-CM | POA: Diagnosis not present

## 2022-06-22 DIAGNOSIS — R54 Age-related physical debility: Secondary | ICD-10-CM | POA: Diagnosis not present

## 2022-06-22 DIAGNOSIS — R29703 NIHSS score 3: Secondary | ICD-10-CM | POA: Diagnosis not present

## 2022-06-22 DIAGNOSIS — B9620 Unspecified Escherichia coli [E. coli] as the cause of diseases classified elsewhere: Secondary | ICD-10-CM | POA: Diagnosis not present

## 2022-06-22 DIAGNOSIS — D509 Iron deficiency anemia, unspecified: Secondary | ICD-10-CM | POA: Diagnosis present

## 2022-06-22 DIAGNOSIS — R0902 Hypoxemia: Secondary | ICD-10-CM | POA: Diagnosis not present

## 2022-06-22 DIAGNOSIS — M6281 Muscle weakness (generalized): Secondary | ICD-10-CM | POA: Diagnosis not present

## 2022-06-22 DIAGNOSIS — I6529 Occlusion and stenosis of unspecified carotid artery: Secondary | ICD-10-CM | POA: Diagnosis not present

## 2022-06-22 DIAGNOSIS — I6389 Other cerebral infarction: Secondary | ICD-10-CM | POA: Diagnosis not present

## 2022-06-22 DIAGNOSIS — Z66 Do not resuscitate: Secondary | ICD-10-CM | POA: Diagnosis present

## 2022-06-22 DIAGNOSIS — F32A Depression, unspecified: Secondary | ICD-10-CM | POA: Diagnosis present

## 2022-06-22 DIAGNOSIS — R4182 Altered mental status, unspecified: Secondary | ICD-10-CM | POA: Diagnosis not present

## 2022-06-22 DIAGNOSIS — M48061 Spinal stenosis, lumbar region without neurogenic claudication: Secondary | ICD-10-CM | POA: Diagnosis not present

## 2022-06-22 DIAGNOSIS — I959 Hypotension, unspecified: Secondary | ICD-10-CM | POA: Diagnosis not present

## 2022-06-22 DIAGNOSIS — M5416 Radiculopathy, lumbar region: Secondary | ICD-10-CM | POA: Diagnosis not present

## 2022-06-22 DIAGNOSIS — F01A3 Vascular dementia, mild, with mood disturbance: Secondary | ICD-10-CM | POA: Diagnosis not present

## 2022-06-22 DIAGNOSIS — K5900 Constipation, unspecified: Secondary | ICD-10-CM | POA: Diagnosis not present

## 2022-06-22 LAB — URINALYSIS, ROUTINE W REFLEX MICROSCOPIC
Bilirubin Urine: NEGATIVE
Glucose, UA: NEGATIVE mg/dL
Hgb urine dipstick: NEGATIVE
Ketones, ur: NEGATIVE mg/dL
Leukocytes,Ua: NEGATIVE
Nitrite: POSITIVE — AB
Protein, ur: NEGATIVE mg/dL
Specific Gravity, Urine: 1.021 (ref 1.005–1.030)
pH: 5 (ref 5.0–8.0)

## 2022-06-22 LAB — CBC WITH DIFFERENTIAL/PLATELET
Abs Immature Granulocytes: 0.03 10*3/uL (ref 0.00–0.07)
Basophils Absolute: 0.1 10*3/uL (ref 0.0–0.1)
Basophils Relative: 1 %
Eosinophils Absolute: 0.2 10*3/uL (ref 0.0–0.5)
Eosinophils Relative: 2 %
HCT: 30.9 % — ABNORMAL LOW (ref 36.0–46.0)
Hemoglobin: 10 g/dL — ABNORMAL LOW (ref 12.0–15.0)
Immature Granulocytes: 0 %
Lymphocytes Relative: 21 %
Lymphs Abs: 2 10*3/uL (ref 0.7–4.0)
MCH: 32.7 pg (ref 26.0–34.0)
MCHC: 32.4 g/dL (ref 30.0–36.0)
MCV: 101 fL — ABNORMAL HIGH (ref 80.0–100.0)
Monocytes Absolute: 1.2 10*3/uL — ABNORMAL HIGH (ref 0.1–1.0)
Monocytes Relative: 13 %
Neutro Abs: 5.8 10*3/uL (ref 1.7–7.7)
Neutrophils Relative %: 63 %
Platelets: 399 10*3/uL (ref 150–400)
RBC: 3.06 MIL/uL — ABNORMAL LOW (ref 3.87–5.11)
RDW: 20 % — ABNORMAL HIGH (ref 11.5–15.5)
WBC: 9.2 10*3/uL (ref 4.0–10.5)
nRBC: 0.3 % — ABNORMAL HIGH (ref 0.0–0.2)

## 2022-06-22 LAB — COMPREHENSIVE METABOLIC PANEL
ALT: 16 U/L (ref 0–44)
AST: 27 U/L (ref 15–41)
Albumin: 3.3 g/dL — ABNORMAL LOW (ref 3.5–5.0)
Alkaline Phosphatase: 88 U/L (ref 38–126)
Anion gap: 8 (ref 5–15)
BUN: 23 mg/dL (ref 8–23)
CO2: 26 mmol/L (ref 22–32)
Calcium: 9 mg/dL (ref 8.9–10.3)
Chloride: 107 mmol/L (ref 98–111)
Creatinine, Ser: 0.89 mg/dL (ref 0.44–1.00)
GFR, Estimated: >60 mL/min (ref >60)
Glucose, Bld: 144 mg/dL — ABNORMAL HIGH (ref 70–99)
Potassium: 4 mmol/L (ref 3.5–5.1)
Sodium: 141 mmol/L (ref 135–145)
Total Bilirubin: 0.7 mg/dL (ref 0.3–1.2)
Total Protein: 7 g/dL (ref 6.5–8.1)

## 2022-06-22 LAB — TROPONIN I (HIGH SENSITIVITY)
Troponin I (High Sensitivity): 68 ng/L — ABNORMAL HIGH (ref <18)
Troponin I (High Sensitivity): 64 ng/L — ABNORMAL HIGH (ref <18)
Troponin I (High Sensitivity): 60 ng/L — ABNORMAL HIGH (ref <18)
Troponin I (High Sensitivity): 60 ng/L — ABNORMAL HIGH (ref <18)

## 2022-06-22 MED ORDER — SENNOSIDES-DOCUSATE SODIUM 8.6-50 MG PO TABS: 1.0000 | ORAL_TABLET | Freq: Every evening | ORAL | Status: DC | PRN

## 2022-06-22 MED ORDER — SODIUM CHLORIDE 0.9 % IV SOLN: INTRAVENOUS | Status: DC

## 2022-06-22 MED ORDER — POLYETHYLENE GLYCOL 3350 17 G PO PACK: 17.0000 g | PACK | Freq: Every day | ORAL | Status: DC | PRN

## 2022-06-22 MED ORDER — ENOXAPARIN SODIUM 40 MG/0.4ML IJ SOSY
40.0000 mg | PREFILLED_SYRINGE | INTRAMUSCULAR | Status: DC
  Administered 2022-06-23 – 2022-06-24 (×3): 40 mg via SUBCUTANEOUS
  Filled 2022-06-22 (×4): qty 0.4

## 2022-06-22 MED ORDER — ACETAMINOPHEN 160 MG/5ML PO SOLN: 650.0000 mg | ORAL | Status: DC | PRN

## 2022-06-22 MED ORDER — SODIUM CHLORIDE 0.9 % IV SOLN
1.0000 g | Freq: Once | INTRAVENOUS | Status: DC
Start: 2022-06-22 — End: 2022-06-22

## 2022-06-22 MED ORDER — ASPIRIN 325 MG PO TABS
325.0000 mg | ORAL_TABLET | Freq: Every day | ORAL | Status: DC
  Administered 2022-06-25: 325 mg via ORAL
  Filled 2022-06-22 (×3): qty 1

## 2022-06-22 MED ORDER — STROKE: EARLY STAGES OF RECOVERY BOOK: Freq: Once | Status: AC

## 2022-06-22 MED ORDER — ACETAMINOPHEN 650 MG RE SUPP
650.0000 mg | RECTAL | Status: DC | PRN
  Filled 2022-06-22 (×2): qty 1

## 2022-06-22 MED ORDER — ACETAMINOPHEN 325 MG PO TABS
650.0000 mg | ORAL_TABLET | ORAL | Status: DC | PRN
  Administered 2022-06-25: 650 mg via ORAL
  Filled 2022-06-22 (×2): qty 2

## 2022-06-22 MED ORDER — ONDANSETRON HCL 4 MG/2ML IJ SOLN: 4.0000 mg | Freq: Three times a day (TID) | INTRAMUSCULAR | Status: DC | PRN

## 2022-06-22 MED ORDER — BISMUTH SUBSALICYLATE 262 MG/15ML PO SUSP: 30.0000 mL | ORAL | Status: DC | PRN

## 2022-06-22 MED ORDER — SACCHAROMYCES BOULARDII 250 MG PO CAPS
250.0000 mg | ORAL_CAPSULE | Freq: Every day | ORAL | Status: DC
  Administered 2022-06-25: 250 mg via ORAL
  Filled 2022-06-22 (×3): qty 1

## 2022-06-22 MED ORDER — SODIUM CHLORIDE 0.9 % IV SOLN
1.0000 g | INTRAVENOUS | Status: DC
  Administered 2022-06-22 – 2022-06-23 (×2): 1 g via INTRAVENOUS
  Filled 2022-06-22 (×3): qty 10

## 2022-06-22 MED ORDER — IOHEXOL 350 MG/ML SOLN
75.0000 mL | Freq: Once | INTRAVENOUS | Status: AC | PRN
  Administered 2022-06-22: 75 mL via INTRAVENOUS

## 2022-06-22 MED ORDER — ASPIRIN 300 MG RE SUPP
300.0000 mg | Freq: Every day | RECTAL | Status: DC
  Administered 2022-06-22 – 2022-06-24 (×2): 300 mg via RECTAL
  Filled 2022-06-22 (×3): qty 1

## 2022-06-22 MED ORDER — MIRTAZAPINE 15 MG PO TABS
7.5000 mg | ORAL_TABLET | Freq: Every day | ORAL | Status: DC
  Administered 2022-06-24: 7.5 mg via ORAL
  Filled 2022-06-22 (×2): qty 1

## 2022-06-22 MED ORDER — CALCIUM CARBONATE ANTACID 500 MG PO CHEW
600.0000 mg | CHEWABLE_TABLET | Freq: Every day | ORAL | Status: DC
  Administered 2022-06-25: 600 mg via ORAL
  Filled 2022-06-22: qty 3

## 2022-06-22 MED ORDER — FERROUS SULFATE 325 (65 FE) MG PO TABS
325.0000 mg | ORAL_TABLET | Freq: Every day | ORAL | Status: DC
  Administered 2022-06-25: 325 mg via ORAL
  Filled 2022-06-22: qty 1

## 2022-06-22 MED ORDER — ATORVASTATIN CALCIUM 20 MG PO TABS
40.0000 mg | ORAL_TABLET | Freq: Every day | ORAL | Status: DC
  Administered 2022-06-25: 40 mg via ORAL
  Filled 2022-06-22 (×2): qty 2

## 2022-06-22 MED ORDER — VITAMIN D 25 MCG (1000 UNIT) PO TABS
1000.0000 [IU] | ORAL_TABLET | Freq: Every day | ORAL | Status: DC
  Administered 2022-06-25: 1000 [IU] via ORAL
  Filled 2022-06-22: qty 1

## 2022-06-22 MED ORDER — MIRABEGRON ER 25 MG PO TB24
25.0000 mg | ORAL_TABLET | Freq: Every day | ORAL | Status: DC
  Administered 2022-06-25: 25 mg via ORAL
  Filled 2022-06-22 (×3): qty 1

## 2022-06-22 MED ORDER — CALCIUM CARBONATE 1500 (600 CA) MG PO TABS: 600.0000 mg | ORAL_TABLET | Freq: Every day | ORAL | Status: DC

## 2022-06-22 NOTE — ED Triage Notes (Signed)
Pt from [facility]. Staff noticed mild left sided weakness around breakfast. EMS was called. DNR brought in from facility

## 2022-06-22 NOTE — Consult Note (Addendum)
NEURO HOSPITALIST CONSULT NOTE   Requestig physician: Dr. Clyde Lundborg  Reason for Consult: Acute onset of left sided weakness  History obtained from:  Husband and Chart     HPI:                                                                                                                                          Melissa [name] is a 89 y.o. female with a PMHx of arthritis, GERD, heart murmur, migraine headaches (last in 2015), iron deficiency anemia and cataract surgery who presented this afternoon from the [facility] facility with new onset of mild left sided weakness noticed around breakfast time. LKN was unclear. EMS was called and she was brought to the ED.   Further history from EDP note has been reviewed: " I talked to the lady from the facility who reports that there was some concerns with her getting dressed around 7 AM but when the provider went in around 8 AM she did not notice any obvious things we did do a neuroexam to the last known normal may have been yesterday versus around 7 AM.  It was not till lunchtime that the provider was told that she had noticed some increasing weakness and then when she examined her she noted that she could not lift up her left arm and left leg.  Otherwise she seem to be eating and drinking fine sensation intact.  She is wheelchair-bound at baseline. I talked to the patient's husband who does report that she has never had issues with weakness on the left side before that she seems at her baseline self otherwise at this time."  MRI brain revealed small acute ischemic infarctions in the right parietal lobe and corona radiata. U/A was also consistent with a UTI.   Past Medical History:  Diagnosis Date   Arthritis    Chest pain    a. 05/2015 - post-prandial.   GERD (gastroesophageal reflux disease)    a. takes Omeprazole daily, prev seen by D. Brodie.   Heart murmur    a. 05/2013 Echo: EF 55-60%, gr2 DD, mild AI/MR.   History of blood  transfusion    no abnormal reaction noted    History of migraine headaches    a. last 10/2013   Iron deficiency anemia    Joint pain    PONV (postoperative nausea and vomiting)    Rectocele 2011    Past Surgical History:  Procedure Laterality Date   cataract surgery  Bilateral    R 03/2001; L 05/2001.   COLONOSCOPY     HYSTEROSCOPY  11/1999   w/ resection of endometrial polyp by uterine curetting.   INTRAMEDULLARY (IM) NAIL INTERTROCHANTERIC Right 07/15/2020   Procedure: INTRAMEDULLARY (IM) NAIL INTERTROCHANTRIC;  Surgeon: Juanell Fairly, MD;  Location: [facility];  Service: Orthopedics;  Laterality: Right;   JOINT REPLACEMENT Bilateral R-9/04, L- 8/05   Total Knee Arthroplasties.   KYPHOPLASTY N/A 04/10/2019   Procedure: KYPHOPLASTY;  Surgeon: Kennedy Bucker, MD;  Location: [facility];  Service: Orthopedics;  Laterality: N/A;   REVERSE SHOULDER ARTHROPLASTY Right 11/26/2013   Procedure: RIGHT REVERSE SHOULDER ARTHROPLASTY;  Surgeon: Senaida Lange, MD;  Location: [facility];  Service: Orthopedics;  Laterality: Right;   SHOULDER ARTHROSCOPY Right    THYROIDECTOMY  at age 89   TONSILLECTOMY      Family History  Problem Relation Age of Onset   Heart attack Mother        died @ 42   Heart attack Brother        died @ 64   Heart attack Brother        died @ 70   Other Father        died suddenly @ 78.              Social History:  reports that she has never smoked. She has never used smokeless tobacco. She reports that she does not drink alcohol and does not use drugs.  Allergies  Allergen Reactions   Sulfonamide Derivatives Hives   Sulfa Antibiotics Other (See Comments) and Rash    MEDICATIONS:                                                                                                                      No current facility-administered medications on file prior to encounter.   Current Outpatient Medications on File Prior to Encounter  Medication Sig Dispense Refill    acetaminophen (TYLENOL) 500 MG tablet Take 1,000 mg by mouth 3 (three) times daily.     bismuth subsalicylate (PEPTO BISMOL) 262 MG/15ML suspension Take 30 mLs by mouth every 4 (four) hours as needed.     calcium carbonate (OSCAL) 1500 (600 Ca) MG TABS tablet Take 600 mg of elemental calcium by mouth daily.     cholecalciferol (VITAMIN D) 1000 units tablet Take 1,000 Units by mouth daily.     ferrous sulfate 325 (65 FE) MG tablet Take 1 tablet (325 mg total) by mouth every other day.     mirabegron ER (MYRBETRIQ) 25 MG TB24 tablet Take 25 mg by mouth daily.     mirtazapine (REMERON) 7.5 MG tablet Take 7.5 mg by mouth at bedtime.     polyethylene glycol (MIRALAX / GLYCOLAX) 17 g packet Take 17 g by mouth every other day.     saccharomyces boulardii (FLORASTOR) 250 MG capsule Take 250 mg by mouth daily.     UNABLE TO FIND Take 4 oz by mouth every evening. Med Name: Medpass       ROS:  No CP, N/V, SOB,    Blood pressure 136/82, pulse 68, temperature 97.7 F (36.5 C), temperature source Oral, resp. rate 18, height 5\' 5"  (1.651 m), weight 49.9 kg, SpO2 97 %.   General Examination:                                                                                                       Physical Exam HEENT- /AT. Oral mucosa poorly hydrated.  Lungs- Respirations unlabored Extremities- Warm and well-perfused  Neurological Examination Mental Status: Awake and alert. Oriented to the month but not the year, day of the week, city or state. Pleasant and cooperative. Poverty of thought. Mood euthymic. Able to follow all commands. Speech is fluent but sparse. Mild dysarthria in the context of poorly hydrated oral mucosa. difficulty. Cranial Nerves: II: Left visual field cut on exam is noted in the context of limitations to assessment due to patient ability to cooperate.  PERRL. III,IV, VI: No ptosis. Horizontal EOM are full but slow with some difficulty with upgaze. No nystagmus. V: Temp and FT sensation are decreased on the left VII: Smile symmetric VIII: HOH IX,X: Mild hypophonia XI: Symmetric shoulder shrug XII: Midline tongue extension Motor: RUE: 5/5 LUE: 3/5 deltoid, 4-/5 biceps and triceps, 4-/5 grip RLE: 5/5 LLE: 4+/5 Sensory:  Normal FT and temp sensation to RUE.  Decreased temp, FT and scratch sensation to LUE.  Intact FT sensation to BLE.   No extinction to DSS when testing lower extremities.  Deep Tendon Reflexes: 1+ and symmetric bilateral biceps and brachioradialis. Unable to elicit patellar reflexes in the context of prior knee surgeries.  Plantars: Right: EquivocalLeft: Downgoing Cerebellar: No ataxia with FNF on the right. Unable to perform on the left. Action tremor is noted.  Gait: Deferred due to falls risk concerns.    Lab Results: Basic Metabolic Panel: Recent Labs  Lab 06/22/22 1347  NA 141  K 4.0  CL 107  CO2 26  GLUCOSE 144*  BUN 23  CREATININE 0.89  CALCIUM 9.0    CBC: Recent Labs  Lab 06/22/22 1347  WBC 9.2  NEUTROABS 5.8  HGB 10.0*  HCT 30.9*  MCV 101.0*  PLT 399    Cardiac Enzymes: No results for input(s): "CKTOTAL", "CKMB", "CKMBINDEX", "TROPONINI" in the last 168 hours.  Lipid Panel: No results for input(s): "CHOL", "TRIG", "HDL", "CHOLHDL", "VLDL", "LDLCALC" in the last 168 hours.  Imaging: CT ANGIO HEAD NECK W WO CM  Result Date: 06/22/2022 CLINICAL DATA:  Stroke suspected EXAM: CT ANGIOGRAPHY HEAD AND NECK WITH AND WITHOUT CONTRAST TECHNIQUE: Multidetector CT imaging of the head and neck was performed using the standard protocol during bolus administration of intravenous contrast. Multiplanar CT image reconstructions and MIPs were obtained to evaluate the vascular anatomy. Carotid stenosis measurements (when applicable) are obtained utilizing NASCET criteria, using the distal internal carotid  diameter as the denominator. RADIATION DOSE REDUCTION: This exam was performed according to the departmental dose-optimization program which includes automated exposure control, adjustment of the mA and/or kV according to patient size and/or use of iterative reconstruction technique. CONTRAST:  75mL OMNIPAQUE IOHEXOL 350 MG/ML SOLN COMPARISON:  Same day CT head and MR head FINDINGS: CT HEAD FINDINGS See same day CT brain for intracranial findings. Infarcts seen on same day brain MRI are not visualized on this exam due to a combination of CT technique and post-contrast imaging. The presence of IV contrast also limits the ability to assess for intracranial blood products. CTA NECK FINDINGS Aortic arch: Two-vessel aortic arch. Imaged portion shows no evidence of aneurysm or dissection. No significant stenosis of the major arch vessel origins. There is a small amount of soft plaque in the proximal left subclavian artery. Right carotid system: No evidence of dissection, stenosis (50% or greater), or occlusion. Left carotid system: No evidence of dissection, stenosis (50% or greater), or occlusion. Vertebral arteries: Codominant. No evidence of dissection, stenosis (50% or greater), or occlusion. The V4 segment of the left vertebral artery is small in caliber Skeleton: Negative. Other neck: Negative. Upper chest: Negative. Review of the MIP images confirms the above findings CTA HEAD FINDINGS Anterior circulation: There is moderate narrowing in the proximal supraclinoid ICA on the right. No aneurysm. No large vessel occlusion Posterior circulation: No significant stenosis, proximal occlusion, aneurysm, or vascular malformation. Venous sinuses: As permitted by contrast timing, patent. Anatomic variants: None Review of the MIP images confirms the above findings IMPRESSION: 1. No large vessel occlusion. Moderate narrowing in the proximal supraclinoid right ICA. 2. No hemodynamically significant stenosis in the neck. 3.  Infarcts seen on same day brain MRI are not visualized on this exam due to a combination of CT technique and post-contrast imaging. The presence of IV contrast also limits the ability to assess for intracranial blood products. Electronically Signed   By: Lorenza Cambridge M.D.   On: 06/22/2022 17:58   MR BRAIN WO CONTRAST  Result Date: 06/22/2022 CLINICAL DATA:  Headache, neuro deficit, suspected stroke. Left-sided weakness. EXAM: MRI HEAD WITHOUT CONTRAST TECHNIQUE: Multiplanar, multiecho pulse sequences of the brain and surrounding structures were obtained without intravenous contrast. COMPARISON:  MRI brain 07/03/2018.  Head CT 06/22/2022. FINDINGS: Brain: Acute infarcts in the right corona radiata and inferior right parietal lobe (image 31 series 5). No acute hemorrhage or significant mass effect. Old infarct in the right occipital lobe. Old lacunar infarcts in the right globus pallidus and bilateral cerebellar hemispheres. Underlying moderate chronic small-vessel disease. No hydrocephalus or extra-axial collection. Vascular: Normal flow voids. Skull and upper cervical spine: Normal marrow signal. Sinuses/Orbits: Unremarkable. Other: None. IMPRESSION: Acute infarcts in the right corona radiata and inferior right parietal lobe. No acute hemorrhage or significant mass effect. Electronically Signed   By: Orvan Falconer M.D.   On: 06/22/2022 16:22   CT HEAD WO CONTRAST ( )  Result Date: 06/22/2022 CLINICAL DATA:  New onset headache. EXAM: CT HEAD WITHOUT CONTRAST TECHNIQUE: Contiguous axial images were obtained from the base of the skull through the vertex without intravenous contrast. RADIATION DOSE REDUCTION: This exam was performed according to the departmental dose-optimization program which includes automated exposure control, adjustment of the mA and/or kV according to patient size and/or use of iterative reconstruction technique. COMPARISON:  06/28/2021. FINDINGS: Brain: No evidence of acute infarction,  hemorrhage, hydrocephalus, extra-axial collection or mass lesion/mass effect. Old right occipital and superior cerebellar infarcts. Vascular: No hyperdense vessel or unexpected calcification. Skull: Normal. Negative for fracture or focal lesion. Sinuses/Orbits: Globes and orbits are unremarkable. Sinuses are clear. Other: None. IMPRESSION: 1. No acute intracranial abnormalities. No change from the previous head CT. Electronically Signed  By: Amie Portland M.D.   On: 06/22/2022 14:43     Assessment: 89 y.o. female with a PMHx of arthritis, GERD, heart murmur, migraine headaches (last in 2015), iron deficiency anemia and cataract surgery who presented this afternoon from the [facility] facility with new onset of mild left sided weakness noticed around breakfast time. LKN was unclear. EMS was called and she was brought to the ED.  - Exam reveals left sided sensory and motor deficits - CT head: No evidence of acute infarction, hemorrhage, hydrocephalus, extra-axial collection or mass lesion/mass effect. Old right occipital and superior cerebellar infarcts. - CTA of head and neck: No large vessel occlusion. Moderate narrowing in the proximal supraclinoid right ICA. No hemodynamically significant stenosis in the neck.  - MRI brain: Small acute infarcts in the right corona radiata and inferior right parietal lobe are approximately 1 cm in diameter. No acute hemorrhage or significant mass effect. Old infarct in the right occipital lobe. Old lacunar infarcts in the right globus pallidus and bilateral cerebellar hemispheres. Underlying moderate chronic small-vessel disease.  - EKG: Sinus rhythm; Probable anteroseptal infarct, old - BUN and Cr are normal. WBC normal. Hgb 11.0. Platelet 348. Glucose normal.  - DDx for stroke etiology includes possible PAF, artery-to-artery embolization and in-situ thrombosis.  - Stroke risk factors: Advanced age and anemia.   Recommendations: - TTE - Cardiac telemetry to assess  for possible PAF - Agree with starting ASA. Given her advanced age and frailty with possible catastrophic effects of uncontrolled bleeding, would hold off on addition of Plavix.  - Consider discontinuing atorvastatin as given her advanced age it is unlikely to have enough long-term benefit to counterbalance short term risks - PT/OT and Speech consults - May be a candidate for rehab - HgbA1c - Frequent neuro checks - IVF - Has failed initial swallow screen in the ED. NPO until she passes stroke swallow screen. Can have a lollipop sponge soaked in water for comfort.  - Modified permissive HTN protocol given advanced age and frailty. Hold off on treating unless SBP > 160.   Addendum: - TTE: LVEF 55 to 60%. The left ventricle has normal function with no regional wall motion abnormalities. No mural thrombus or valvular vegetation mentioned in the report.    - Stroke work up is complete except for assessment of telemetry for possible atrial fibrillation - Will need outpatient Neurology follow up.  - Neurohospitalist service will follow PRN. If atrial fibrillation is seen on telemetry, please call our service for further recommendations.    Electronically signed: Dr. Caryl Pina 06/22/2022, 8:05 PM

## 2022-06-22 NOTE — ED Provider Notes (Addendum)
[facility] Provider Note    Event Date/Time   First MD Initiated Contact with Patient 06/22/22 1325     (approximate)   History   Weakness (Left sided weakness)   HPI  Melissa [name] is a 89 y.o. female with a legal guardian who comes in from [facility] with concern for left-sided weakness.  They noticed it around breakfast time unclear if she was ever seen normal.  Patient herself denies any symptoms.  Denies any known falls.   I talked to the lady from the facility who reports that there was some concerns with her getting dressed around 7 AM but when the provider went in around 8 AM she did not notice any obvious things we did do a neuroexam to the last known normal may have been yesterday versus around 7 AM.  It was not till lunchtime that the provider was told that she had noticed some increasing weakness and then when she examined her she noted that she could not lift up her left arm and left leg.  Otherwise she seem to be eating and drinking fine sensation intact.  She is wheelchair-bound at baseline.   I talked to the patient's husband who does report that she has never had issues with weakness on the left side before that she seems at her baseline self otherwise at this time.   Physical Exam   Triage Vital Signs: ED Triage Vitals  Enc Vitals Group     BP      Pulse      Resp      Temp      Temp src      SpO2      Weight      Height      Head Circumference      Peak Flow      Pain Score      Pain Loc      Pain Edu?      Excl. in GC?     Most recent vital signs: Vitals:   06/22/22 1336 06/22/22 1337  BP:  (!) 128/48  Pulse:  76  Resp:  (!) 23  Temp: 97.7 F (36.5 C)   SpO2:  97%     General: Awake, no distress.  CV:  Good peripheral perfusion.  Resp:  Normal effort.  Abd:  No distention.  Other:  Patient has some weak grip strength noted in the left hand.  Good grip strength on the right.  She is able to lift both legs up  off the bed.  Sensation appears intact.  Cranial nerves appear intact otherwise   ED Results / Procedures / Treatments   Labs (all labs ordered are listed, but only abnormal results are displayed) Labs Reviewed  CBC WITH DIFFERENTIAL/PLATELET - Abnormal; Notable for the following components:      Result Value   RBC 3.06 (*)    Hemoglobin 10.0 (*)    HCT 30.9 (*)    MCV 101.0 (*)    RDW 20.0 (*)    nRBC 0.3 (*)    Monocytes Absolute 1.2 (*)    All other components within normal limits  COMPREHENSIVE METABOLIC PANEL  URINALYSIS, ROUTINE W REFLEX MICROSCOPIC  TROPONIN I (HIGH SENSITIVITY)     EKG  My interpretation of EKG:  Sinus rate of 75 without any ST elevation or T wave inversions, normal intervals there is some artifact noted so we will get a repeat  Sinus rate of  70 without any ST elevation or T wave inversions, normal intervals  RADIOLOGY I have reviewed the CT head personally interpreted no evidence of intracranial hemorrhage  PROCEDURES:  Critical Care performed: No  .1-3 Lead EKG Interpretation  Performed by: Concha Se, MD Authorized by: Concha Se, MD     Interpretation: normal     ECG rate:  70   ECG rate assessment: normal     Rhythm: sinus rhythm     Ectopy: none     Conduction: normal      MEDICATIONS ORDERED IN ED: Medications - No data to display   IMPRESSION / MDM / ASSESSMENT AND PLAN / ED COURSE  I reviewed the triage vital signs and the nursing notes.   Patient's presentation is most consistent with acute presentation with potential threat to life or bodily function.   Patient comes in with possible stroke.  Patient out of the window for stroke code given the last known normal either yesterday or may be around 8 AM.  She has got some weakness to the left grip noted.  Will hold off on stroke code due to not a TNK candidate no speech issues and no other issues to suggest this being a LVO.  I did discuss with patient's husband about  goals of care and consideration of admission for TIA workup if MRI negative versus going home but given she continues to have some slight weakness in the left arm he thinks that he would prefer her to go home but not sure.  Patient will be handed off to oncoming team pending workup after MRI and further discussion with family about admission for stroke/TIA versus discharge      FINAL CLINICAL IMPRESSION(S) / ED DIAGNOSES   Final diagnoses:  Left-sided weakness     Rx / DC Orders   ED Discharge Orders     None        Note:  This document was prepared using Dragon voice recognition software and may include unintentional dictation errors.   Concha Se, MD 06/22/22 1423    Concha Se, MD 06/22/22 [id number]

## 2022-06-22 NOTE — H&P (Signed)
History and Physical    Melissa [name]:[id number] DOB: Jun 04, 1928 DOA: 06/22/2022  Referring MD/NP/PA:   PCP: Earnestine Mealing, MD   Patient coming from:  The patient is coming from ALF, [facility]   Chief Complaint: left sided weakness  HPI: Melissa [name] is a 89 y.o. female with medical history significant of iron deficiency anemia, GERD, depression, migraine headache, rectocele, wheelchair-bound at baseline, who presents with left-sided weakness.  Pt is from [facility]F. Per report, pt was noted to have left sided weakness at lunchtime. Not sure the exactly when she was last known normal.  Per report, pt was getting dressed around 7 AM but when the provider went in around 8 AM she did not notice any obvious things. It was not till lunchtime that the provider was told that she had noticed some weakness in left arm and leg.  Patient does not have facial droop or slurred speech.  Denies chest pain, cough, shortness of breath.  No nausea, vomiting, diarrhea or abdominal pain.  She denies symptoms of UTI.  Per her husband at the bedside, patient never had left-sided weakness before.  Data reviewed independently and ED Course: pt was found to have WBC 9.2, GFR> 60, urinalysis (hazy appearance, negative leukocyte, positive nitrite, many bacteria, WBC 6-10).  Temperature normal, blood pressure 136/82, heart rate 76, RR 23, oxygen saturation 97% on room air.  CT of head negative.  MRI showed acute stroke.  Patient is admitted to telemetry bed as inpatient.  Dr. Otelia Limes of neurology is consulted.  MRI-brain: Acute infarcts in the right corona radiata and inferior right parietal lobe. No acute hemorrhage or significant mass effect.  EKG: I have personally reviewed.  Unstable baseline, sinus rhythm, low voltage, anteroseptal infarction pattern, borderline left axis deviation.   Review of Systems:   General: no fevers, chills, no body weight gain, fatigue HEENT: no blurry vision, hearing  changes or sore throat Respiratory: no dyspnea, coughing, wheezing CV: no chest pain, no palpitations GI: no nausea, vomiting, abdominal pain, diarrhea, constipation GU: no dysuria, burning on urination, increased urinary frequency, hematuria  Ext: no leg edema Neuro: no vision change or hearing loss. Has left-sided weakness Skin: no rash, no skin tear. MSK: No muscle spasm, no deformity, no limitation of range of movement in spin Heme: No easy bruising.  Travel history: No recent long distant travel.   Allergy:  Allergies  Allergen Reactions   Sulfonamide Derivatives Hives   Sulfa Antibiotics Other (See Comments) and Rash    Past Medical History:  Diagnosis Date   Arthritis    Chest pain    a. 05/2015 - post-prandial.   GERD (gastroesophageal reflux disease)    a. takes Omeprazole daily, prev seen by D. Brodie.   Heart murmur    a. 05/2013 Echo: EF 55-60%, gr2 DD, mild AI/MR.   History of blood transfusion    no abnormal reaction noted    History of migraine headaches    a. last 10/2013   Iron deficiency anemia    Joint pain    PONV (postoperative nausea and vomiting)    Rectocele 2011    Past Surgical History:  Procedure Laterality Date   cataract surgery  Bilateral    R 03/2001; L 05/2001.   COLONOSCOPY     HYSTEROSCOPY  11/1999   w/ resection of endometrial polyp by uterine curetting.   INTRAMEDULLARY (IM) NAIL INTERTROCHANTERIC Right 07/15/2020   Procedure: INTRAMEDULLARY (IM) NAIL INTERTROCHANTRIC;  Surgeon: Juanell Fairly,  MD;  Location: [facility] ORS;  Service: Orthopedics;  Laterality: Right;   JOINT REPLACEMENT Bilateral R-9/04, L- 8/05   Total Knee Arthroplasties.   KYPHOPLASTY N/A 04/10/2019   Procedure: KYPHOPLASTY;  Surgeon: Kennedy Bucker, MD;  Location: [facility] ORS;  Service: Orthopedics;  Laterality: N/A;   REVERSE SHOULDER ARTHROPLASTY Right 11/26/2013   Procedure: RIGHT REVERSE SHOULDER ARTHROPLASTY;  Surgeon: Senaida Lange, MD;  Location: [facility] OR;  Service:  Orthopedics;  Laterality: Right;   SHOULDER ARTHROSCOPY Right    THYROIDECTOMY  at age 89   TONSILLECTOMY      Social History:  reports that she has never smoked. She has never used smokeless tobacco. She reports that she does not drink alcohol and does not use drugs.  Family History:  Family History  Problem Relation Age of Onset   Heart attack Mother        died @ 25   Heart attack Brother        died @ 9   Heart attack Brother        died @ 92   Other Father        died suddenly @ 66.     Prior to Admission medications   Medication Sig Start Date End Date Taking? Authorizing Provider  acetaminophen (TYLENOL) 500 MG tablet Take 1,000 mg by mouth 3 (three) times daily.    [provider]  bismuth subsalicylate (PEPTO BISMOL) 262 MG/15ML suspension Take 30 mLs by mouth every 4 (four) hours as needed.    [provider]  cholecalciferol (VITAMIN D) 1000 units tablet Take 1,000 Units by mouth daily.    [provider]  ferrous sulfate 325 (65 FE) MG tablet Take 1 tablet (325 mg total) by mouth every other day. 07/19/20 02/20/48  Lurene Shadow, MD  mirabegron ER (MYRBETRIQ) 25 MG TB24 tablet Take 25 mg by mouth daily.    [provider]  mirtazapine (REMERON) 7.5 MG tablet Take 7.5 mg by mouth at bedtime.    [provider]  polyethylene glycol (MIRALAX / GLYCOLAX) 17 g packet Take 17 g by mouth every other day.    [provider]  saccharomyces boulardii (FLORASTOR) 250 MG capsule Take 250 mg by mouth daily.    [provider]  UNABLE TO FIND Take 4 oz by mouth every evening. Med Name: Medpass    [provider]    Physical Exam: Vitals:   06/22/22 1337 06/22/22 1352 06/22/22 1512 06/22/22 1530  BP: (!) 128/48  (!) 138/56 136/82  Pulse: 76  68 68  Resp: (!) 23  20 18   Temp:      TempSrc:      SpO2: 97%  97%   Weight:  49.9 kg    Height:  5\' 5"  (1.651 m)     General: Not in acute distress HEENT:        Eyes: PERRL, EOMI, no scleral icterus.       ENT: No discharge from the ears and nose, no pharynx injection, no tonsillar enlargement.        Neck: No JVD, no bruit, no mass felt. Heme: No neck lymph node enlargement. Cardiac: S1/S2, RRR, No murmurs, No gallops or rubs. Respiratory: No rales, wheezing, rhonchi or rubs. GI: Soft, nondistended, nontender, no rebound pain, no organomegaly, BS present. GU: No hematuria Ext: No pitting leg edema bilaterally. 1+DP/PT pulse bilaterally. Musculoskeletal: No joint deformities, No joint redness or warmth, no limitation of ROM in spin. Skin: No rashes.  Neuro: Alert, following command, answers all questions appropriately, cranial nerves II-XII grossly intact. Muscle strength 3/5 in left arm and 4/5 in left  leg and 5/5 in right extremities, sensation to light touch intact. Brachial reflex 2+ bilaterally. Knee reflex 1+ bilaterally. Negative Babinski's sign. Normal finger to nose test. Psych: Patient is not psychotic, no suicidal or hemocidal ideation.  Labs on Admission: I have personally reviewed following labs and imaging studies  CBC: Recent Labs  Lab 06/22/22 1347  WBC 9.2  NEUTROABS 5.8  HGB 10.0*  HCT 30.9*  MCV 101.0*  PLT 399   Basic Metabolic Panel: Recent Labs  Lab 06/22/22 1347  NA 141  K 4.0  CL 107  CO2 26  GLUCOSE 144*  BUN 23  CREATININE 0.89  CALCIUM 9.0   GFR: Estimated Creatinine Clearance: 32.4 mL/min (by C-G formula based on SCr of 0.89 mg/dL). Liver Function Tests: Recent Labs  Lab 06/22/22 1347  AST 27  ALT 16  ALKPHOS 88  BILITOT 0.7  PROT 7.0  ALBUMIN 3.3*   No results for input(s): "LIPASE", "AMYLASE" in the last 168 hours. No results for input(s): "AMMONIA" in the last 168 hours. Coagulation Profile: No results for input(s): "INR", "PROTIME" in the last 168 hours. Cardiac Enzymes: No results for input(s): "CKTOTAL", "CKMB", "CKMBINDEX", "TROPONINI" in the last 168 hours. BNP (last 3  results) No results for input(s): "PROBNP" in the last 8760 hours. HbA1C: No results for input(s): "HGBA1C" in the last 72 hours. CBG: No results for input(s): "GLUCAP" in the last 168 hours. Lipid Profile: No results for input(s): "CHOL", "HDL", "LDLCALC", "TRIG", "CHOLHDL", "LDLDIRECT" in the last 72 hours. Thyroid Function Tests: No results for input(s): "TSH", "T4TOTAL", "FREET4", "T3FREE", "THYROIDAB" in the last 72 hours. Anemia Panel: No results for input(s): "VITAMINB12", "FOLATE", "FERRITIN", "TIBC", "IRON", "RETICCTPCT" in the last 72 hours. Urine analysis:    Component Value Date/Time   COLORURINE YELLOW (A) 06/22/2022 1347   APPEARANCEUR HAZY (A) 06/22/2022 1347   LABSPEC 1.021 06/22/2022 1347   PHURINE 5.0 06/22/2022 1347   GLUCOSEU NEGATIVE 06/22/2022 1347   HGBUR NEGATIVE 06/22/2022 1347   BILIRUBINUR NEGATIVE 06/22/2022 1347   KETONESUR NEGATIVE 06/22/2022 1347   PROTEINUR NEGATIVE 06/22/2022 1347   NITRITE POSITIVE (A) 06/22/2022 1347   LEUKOCYTESUR NEGATIVE 06/22/2022 1347   Sepsis Labs: @LABRCNTIP (procalcitonin:4,lacticidven:4) )No results found for this or any previous visit (from the past 240 hour(s)).   Radiological Exams on Admission: CT ANGIO HEAD NECK W WO CM  Result Date: 06/22/2022 CLINICAL DATA:  Stroke suspected EXAM: CT ANGIOGRAPHY HEAD AND NECK WITH AND WITHOUT CONTRAST TECHNIQUE: Multidetector CT imaging of the head and neck was performed using the standard protocol during bolus administration of intravenous contrast. Multiplanar CT image reconstructions and MIPs were obtained to evaluate the vascular anatomy. Carotid stenosis measurements (when applicable) are obtained utilizing NASCET criteria, using the distal internal carotid diameter as the denominator. RADIATION DOSE REDUCTION: This exam was performed according to the departmental dose-optimization program which includes automated exposure control, adjustment of the mA and/or kV according to  patient size and/or use of iterative reconstruction technique. CONTRAST:  75mL OMNIPAQUE IOHEXOL 350 MG/ML SOLN COMPARISON:  Same day CT head and MR head FINDINGS: CT HEAD FINDINGS See same day CT brain for intracranial findings. Infarcts seen on same day brain MRI are not visualized on this exam due to a combination of CT technique and post-contrast imaging. The presence of IV contrast also limits the ability to assess for intracranial blood  products. CTA NECK FINDINGS Aortic arch: Two-vessel aortic arch. Imaged portion shows no evidence of aneurysm or dissection. No significant stenosis of the major arch vessel origins. There is a small amount of soft plaque in the proximal left subclavian artery. Right carotid system: No evidence of dissection, stenosis (50% or greater), or occlusion. Left carotid system: No evidence of dissection, stenosis (50% or greater), or occlusion. Vertebral arteries: Codominant. No evidence of dissection, stenosis (50% or greater), or occlusion. The V4 segment of the left vertebral artery is small in caliber Skeleton: Negative. Other neck: Negative. Upper chest: Negative. Review of the MIP images confirms the above findings CTA HEAD FINDINGS Anterior circulation: There is moderate narrowing in the proximal supraclinoid ICA on the right. No aneurysm. No large vessel occlusion Posterior circulation: No significant stenosis, proximal occlusion, aneurysm, or vascular malformation. Venous sinuses: As permitted by contrast timing, patent. Anatomic variants: None Review of the MIP images confirms the above findings IMPRESSION: 1. No large vessel occlusion. Moderate narrowing in the proximal supraclinoid right ICA. 2. No hemodynamically significant stenosis in the neck. 3. Infarcts seen on same day brain MRI are not visualized on this exam due to a combination of CT technique and post-contrast imaging. The presence of IV contrast also limits the ability to assess for intracranial blood products.  Electronically Signed   By: Lorenza Cambridge M.D.   On: 06/22/2022 17:58   MR BRAIN WO CONTRAST  Result Date: 06/22/2022 CLINICAL DATA:  Headache, neuro deficit, suspected stroke. Left-sided weakness. EXAM: MRI HEAD WITHOUT CONTRAST TECHNIQUE: Multiplanar, multiecho pulse sequences of the brain and surrounding structures were obtained without intravenous contrast. COMPARISON:  MRI brain 07/03/2018.  Head CT 06/22/2022. FINDINGS: Brain: Acute infarcts in the right corona radiata and inferior right parietal lobe (image 31 series 5). No acute hemorrhage or significant mass effect. Old infarct in the right occipital lobe. Old lacunar infarcts in the right globus pallidus and bilateral cerebellar hemispheres. Underlying moderate chronic small-vessel disease. No hydrocephalus or extra-axial collection. Vascular: Normal flow voids. Skull and upper cervical spine: Normal marrow signal. Sinuses/Orbits: Unremarkable. Other: None. IMPRESSION: Acute infarcts in the right corona radiata and inferior right parietal lobe. No acute hemorrhage or significant mass effect. Electronically Signed   By: Orvan Falconer M.D.   On: 06/22/2022 16:22   CT HEAD WO CONTRAST ( )  Result Date: 06/22/2022 CLINICAL DATA:  New onset headache. EXAM: CT HEAD WITHOUT CONTRAST TECHNIQUE: Contiguous axial images were obtained from the base of the skull through the vertex without intravenous contrast. RADIATION DOSE REDUCTION: This exam was performed according to the departmental dose-optimization program which includes automated exposure control, adjustment of the mA and/or kV according to patient size and/or use of iterative reconstruction technique. COMPARISON:  06/28/2021. FINDINGS: Brain: No evidence of acute infarction, hemorrhage, hydrocephalus, extra-axial collection or mass lesion/mass effect. Old right occipital and superior cerebellar infarcts. Vascular: No hyperdense vessel or unexpected calcification. Skull: Normal. Negative for  fracture or focal lesion. Sinuses/Orbits: Globes and orbits are unremarkable. Sinuses are clear. Other: None. IMPRESSION: 1. No acute intracranial abnormalities. No change from the previous head CT. Electronically Signed   By: Amie Portland M.D.   On: 06/22/2022 14:43      Assessment/Plan Principal Problem:   Stroke [facility]) Active Problems:   UTI (urinary tract infection)   Myocardial injury   Depression   ANEMIA, IRON DEFICIENCY   Protein-calorie malnutrition, severe (HCC)   Assessment and Plan:  Stroke [facility]): MRI showed acute infarcts in the right corona  radiata and inferior right parietal lobe. Consulted Dr. Otelia Limes of neuro.  -Admit to tele med bed as inpatient - Check CTA of  head and neck - will hold oral Bp meds to allow permissive HTN (pt does not have history of hypertension) - start ASA and lipitor 40 mg daily - fasting lipid panel and HbA1c  - 2D transthoracic echocardiography  - swallowing screen. If fails, will get SLP - PT/OT consult  UTI (urinary tract infection) -IV rocephin -f/u urine culture  Myocardial injury: trop  68 --> 64. No CP. -on ASA and lipitor -f/u 2d echo. -checked A1c and FLp  Depression -Continue home medications  ANEMIA, IRON DEFICIENCY -Continue iron supplement  Protein-calorie malnutrition, severe (HCC): Body weight 49.9 kg, BMI 18.3 -Consult nutrition      DVT ppx: SQ Lovenox  Code Status: DNR (pt has DNR form from facility)  Family Communication:  Yes, patient's husband  at bed side.   Disposition Plan:  Anticipate discharge back to previous environment  Consults called:  Dr. Otelia Limes  Admission status and Level of care: Telemetry Medical:    as inpt     Dispo: The patient is from: ALF              Anticipated d/c is to: ALF              Anticipated d/c date is: 2 days              Patient currently is not medically stable to d/c.    Severity of Illness:  The appropriate patient status for this patient is  INPATIENT. Inpatient status is judged to be reasonable and necessary in order to provide the required intensity of service to ensure the patient's safety. The patient's presenting symptoms, physical exam findings, and initial radiographic and laboratory data in the context of their chronic comorbidities is felt to place them at high risk for further clinical deterioration. Furthermore, it is not anticipated that the patient will be medically stable for discharge from the hospital within 2 midnights of admission.   * I certify that at the point of admission it is my clinical judgment that the patient will require inpatient hospital care spanning beyond 2 midnights from the point of admission due to high intensity of service, high risk for further deterioration and high frequency of surveillance required.*       Date of Service 06/22/2022    Lorretta Harp [facility]   If 7PM-7AM, please contact night-coverage [email or web address] 06/22/2022, 6:57 PM

## 2022-06-22 NOTE — ED Notes (Signed)
Pt noted to have some aphasia and dysarthria, Dr Clyde Lundborg notified. Spoke with husband and asking If this was baseline for pt, he reports that in the evenings this is normal for her as she gets tired.

## 2022-06-22 NOTE — ED Provider Notes (Signed)
89 yo F here with AMS, L sided weakness. MRI shows small infarcts in R parietal and corona radiata. UA also with UTI. Admit to medicine. Husband, son notified.   Shaune Pollack, MD 06/22/22 [phone]

## 2022-06-23 ENCOUNTER — Encounter: Payer: Self-pay | Admitting: Internal Medicine

## 2022-06-23 ENCOUNTER — Inpatient Hospital Stay (HOSPITAL_COMMUNITY)
Admit: 2022-06-23 | Discharge: 2022-06-23 | Disposition: A | Discharge status: Home / Self Care | Payer: Medicare Other | Attending: Internal Medicine | Admitting: Internal Medicine

## 2022-06-23 DIAGNOSIS — E43 Unspecified severe protein-calorie malnutrition: Secondary | ICD-10-CM | POA: Diagnosis not present

## 2022-06-23 DIAGNOSIS — I639 Cerebral infarction, unspecified: Secondary | ICD-10-CM | POA: Diagnosis not present

## 2022-06-23 DIAGNOSIS — N3000 Acute cystitis without hematuria: Secondary | ICD-10-CM | POA: Diagnosis not present

## 2022-06-23 DIAGNOSIS — D509 Iron deficiency anemia, unspecified: Secondary | ICD-10-CM | POA: Diagnosis not present

## 2022-06-23 DIAGNOSIS — I6389 Other cerebral infarction: Secondary | ICD-10-CM | POA: Diagnosis not present

## 2022-06-23 LAB — LIPID PANEL
Cholesterol: 154 mg/dL (ref 0–200)
HDL: 67 mg/dL (ref >40)
LDL Cholesterol: 78 mg/dL (ref 0–99)
Total CHOL/HDL Ratio: 2.3 RATIO
Triglycerides: 45 mg/dL (ref <150)
VLDL: 9 mg/dL (ref 0–40)

## 2022-06-23 LAB — ECHOCARDIOGRAM COMPLETE
AR max vel: 1.73 cm2
AV Area VTI: 1.49 cm2
AV Area mean vel: 1.32 cm2
AV Mean grad: 11.5 mmHg
AV Peak grad: 18.7 mmHg
Ao pk vel: 2.16 m/s
Area-P 1/2: 3.91 cm2
Calc EF: 69.2 %
Height: 65 in
MV M vel: 6.59 m/s
MV Peak grad: 173.7 mmHg
MV VTI: 3.13 cm2
P 1/2 time: 531 msec
Radius: 0.3 cm
S' Lateral: 2.8 cm
Single Plane A2C EF: 70 %
Single Plane A4C EF: 71.3 %
Weight: 1760 oz

## 2022-06-23 LAB — TROPONIN I (HIGH SENSITIVITY): Troponin I (High Sensitivity): 52 ng/L — ABNORMAL HIGH (ref <18)

## 2022-06-23 LAB — HEMOGLOBIN A1C
Hgb A1c MFr Bld: 5.2 % (ref 4.8–5.6)
Mean Plasma Glucose: 102.54 mg/dL

## 2022-06-23 NOTE — Progress Notes (Signed)
PT Cancellation Note  Patient Details Name: Melissa [name] MRN: [id number] DOB: 15-Jul-1928   Cancelled Treatment:    Reason Eval/Treat Not Completed: Other (comment). Pt unavailable, PT to re-attempt as able.   Olga Coaster PT, DPT 9:50 AM,06/23/22

## 2022-06-23 NOTE — Assessment & Plan Note (Signed)
UA concerning for UTI.  Urine cultures with gram-negative rods. -Continue with ceftriaxone

## 2022-06-23 NOTE — Assessment & Plan Note (Signed)
MRI showed acute infarcts in the right corona radiata and inferior right parietal lobe.  Failed initial swallow evaluation. CTA negative for any large vessel occlusion.  Neurology is on board. -Permissive hypertension -Aspirin and Lipitor -Avoiding Plavix due to fragility. -A1c 5.2, lipid panel normal -Pending echocardiogram, PT and OT evaluation -Pending formal swallow evaluation

## 2022-06-23 NOTE — Assessment & Plan Note (Signed)
Estimated body mass index is 18.3 kg/m as calculated from the following:   Height as of this encounter: 5\' 5"  (1.651 m).   Weight as of this encounter: 49.9 kg.   -Dietary consult

## 2022-06-23 NOTE — Assessment & Plan Note (Signed)
Continue iron supplement.

## 2022-06-23 NOTE — Evaluation (Signed)
Occupational Therapy Evaluation Patient Details Name: Melissa [name] MRN: [id number] DOB: 16-Oct-1928 Today's Date: 06/23/2022   History of Present Illness Melissa [name] is a 89 y.o. female with medical history significant of iron deficiency anemia, GERD, depression, migraine headache, rectocele, wheelchair-bound at baseline, who presents with left-sided weakness.   Clinical Impression   Pt seen for OT evaluation this date .  Pt lives at [facility], has resided in the Healthcare building for the last year.  Her husband of 74 years lives in a home in [place].  Pt was wheelchair bound prior to admission and required assistance with bathing, dressing and grooming.  Husband reports she was able to help more with transfers than she is currently.  He also reports she was able to feed herself and appears to require min assist at times while at the hospital. She demonstrates mild decreased left UE ROM, strength and coordination. She is pleasantly confused, oriented to self and her spouse only.  She is easily distracted and has some difficulty with following commands.  She would benefit from skilled OT services to work towards improving function back to her baseline level of function.       Recommendations for follow up therapy are one component of a multi-disciplinary discharge planning process, led by the attending physician.  Recommendations may be updated based on patient status, additional functional criteria and insurance authorization.   Assistance Recommended at Discharge    Patient can return home with the following A lot of help with walking and/or transfers;A lot of help with bathing/dressing/bathroom;Direct supervision/assist for medications management;Assistance with feeding    Functional Status Assessment  Patient has had a recent decline in their functional status and demonstrates the ability to make significant improvements in function in a reasonable and predictable amount of time.   Equipment Recommendations       Recommendations for Other Services       Precautions / Restrictions Precautions Precautions: Fall Restrictions Weight Bearing Restrictions: No      Mobility Bed Mobility Overal bed mobility: Needs Assistance Bed Mobility: Supine to Sit, Sit to Supine     Supine to sit: Max assist Sit to supine: Max assist        Transfers                   General transfer comment: unable to transfer with assist of 1 and easily distracted with task.      Balance                                           ADL either performed or assessed with clinical judgement   ADL Overall ADL's : Needs assistance/impaired Eating/Feeding: Set up;Minimal assistance   Grooming: Set up;Minimal assistance   Upper Body Bathing: Moderate assistance   Lower Body Bathing: Maximal assistance   Upper Body Dressing : Minimal assistance   Lower Body Dressing: Maximal assistance   Toilet Transfer: Maximal assistance             General ADL Comments: max assist with bed mobility this date and unable to stand.  Husband reports pt was able to help with transfers a little bit from bed to wheelchair prior to admission.     Vision Baseline Vision/History: 1 Wears glasses       Perception     Praxis      Pertinent  Vitals/Pain Pain Assessment Pain Assessment: No/denies pain     Hand Dominance Right   Extremity/Trunk Assessment Upper Extremity Assessment Upper Extremity Assessment: Generalized weakness;LUE deficits/detail LUE Deficits / Details: Pt demonstrates mildly decreased ROM at left shoulder compared to right, shoulder flexion to 110 degrees, fisting, opposition to each fingertip with moderate cues.  Decreased coordination with left UE reach and mild ataxic movement noted. LUE Sensation: WNL LUE Coordination: decreased fine motor;decreased gross motor   Lower Extremity Assessment Lower Extremity Assessment: Defer to PT  evaluation       Communication Communication Communication: No difficulties   Cognition Arousal/Alertness: Awake/alert Behavior During Therapy: WFL for tasks assessed/performed Overall Cognitive Status: History of cognitive impairments - at baseline                                 General Comments: Pt oriented to self, unable to state, month/day/year or where she is at currently.  She reports she lives in [place] (husband reports they lived there years ago).  She is able to tell me her husband's name and recognized him when he came into the room.     General Comments       Exercises     Shoulder Instructions      Home Living Family/patient expects to be discharged to:: Skilled nursing facility                                 Additional Comments: Pt has resided in the [facility] healthcare center for the last year.  Husband lives in a home in the [facility] community and visits her daily.      Prior Functioning/Environment Prior Level of Function : Needs assist  Cognitive Assist : Mobility (cognitive);ADLs (cognitive) Mobility (Cognitive): Step by step cues ADLs (Cognitive): Step by step cues Physical Assist : Mobility (physical);ADLs (physical) Mobility (physical): Transfers ADLs (physical): Feeding;Grooming;Bathing;Dressing;Toileting;IADLs Mobility Comments: Husband reports patient is w/c bound but able to assist in the past with transfers ADLs Comments: At baseline, pt feed herself, requires assist for bathing, dressing and toileting.  She requires assist for transfers.        OT Problem List: Decreased strength;Decreased cognition;Impaired balance (sitting and/or standing);Decreased range of motion;Decreased safety awareness;Decreased activity tolerance;Decreased coordination      OT Treatment/Interventions: Self-care/ADL training;Therapeutic activities;Balance training;Therapeutic exercise;Cognitive remediation/compensation;Patient/family  education    OT Goals(Current goals can be found in the care plan section) Acute Rehab OT Goals Patient Stated Goal: Pt reports she wants to go home with her husband. OT Goal Formulation: With patient Time For Goal Achievement: 07/07/22 Potential to Achieve Goals: Fair ADL Goals Pt Will Perform Eating: with modified independence Pt Will Perform Grooming: with modified independence Pt Will Perform Upper Body Bathing: with set-up;with min assist Pt Will Perform Upper Body Dressing: with set-up;with min assist Pt Will Transfer to Toilet: with mod assist  OT Frequency: Min 1X/week    Co-evaluation              AM-PAC OT "6 Clicks" Daily Activity     Outcome Measure Help from another person eating meals?: A Little Help from another person taking care of personal grooming?: A Little Help from another person toileting, which includes using toliet, bedpan, or urinal?: Total Help from another person bathing (including washing, rinsing, drying)?: Total Help from another person to put on and taking off regular  upper body clothing?: A Little Help from another person to put on and taking off regular lower body clothing?: Total 6 Click Score: 12   End of Session    Activity Tolerance: Patient tolerated treatment well Patient left: in bed;with bed alarm set;with family/visitor present  OT Visit Diagnosis: Unsteadiness on feet (R26.81);Muscle weakness (generalized) (M62.81);History of falling (Z91.81)                Time: 1415-1450 OT Time Calculation (min): 35 min Charges:  OT General Charges $OT Visit: 1 Visit OT Evaluation $OT Eval Low Complexity: 1 Low OT Treatments $Self Care/Home Management : 8-22 mins  Christl Fessenden T Millie Forde, OTR/L, CLT Camellia Popescu 06/23/2022, 3:02 PM

## 2022-06-23 NOTE — Progress Notes (Signed)
Progress Note   Patient: Melissa [name] ZOX:[id number] DOB: [birth year] DOA: 06/22/2022     1 DOS: the patient was seen and examined on 06/23/2022   Brief hospital course: Taken from H&P.  Melissa [name] is a 89 y.o. female with medical history significant of iron deficiency anemia, GERD, depression, migraine headache, rectocele, wheelchair-bound at baseline, who presents with left-sided weakness from twin [facility].  Data reviewed independently and ED Course: pt was found to have WBC 9.2, GFR> 60, urinalysis (hazy appearance, negative leukocyte, positive nitrite, many bacteria, WBC 6-10).  Temperature normal, blood pressure 136/82, heart rate 76, RR 23, oxygen saturation 97% on room air.  CT of head negative.  MRI showed acute stroke.  Neurology was consulted   MRI-brain: Acute infarcts in the right corona radiata and inferior right parietal lobe. No acute hemorrhage or significant mass effect.   EKG:  Unstable baseline, sinus rhythm, low voltage, anteroseptal infarction pattern, borderline left axis deviation.  5/4: Vital stable.  Failed initial swallow screen in ED.  Neurology is recommending complete stroke workup.  Only aspirin to decrease the risk of bleeding due to her frailty, Plavix was not added.  CTA head and neck with no large vessel occlusion. Moderate narrowing in the proximal supraclinoid right ICA. No hemodynamically significant stenosis in the neck.  A1c of 5.2, troponin 68>>>60>>52.  Urine cultures pending.  Lipid panel normal. Echocardiogram pending Pending swallow evaluation by swallow team.    Assessment and Plan: * Stroke [facility]) MRI showed acute infarcts in the right corona radiata and inferior right parietal lobe.  Failed initial swallow evaluation. CTA negative for any large vessel occlusion.  Neurology is on board. -Permissive hypertension -Aspirin and Lipitor -Avoiding Plavix due to fragility. -A1c 5.2, lipid panel normal -Pending echocardiogram, PT and OT  evaluation -Pending formal swallow evaluation  UTI (urinary tract infection) UA concerning for UTI.  Urine cultures with gram-negative rods. -Continue with ceftriaxone  Myocardial injury Most likely secondary to demand ischemia.  No chest pain and troponin mildly elevated at 68 with downward trend. -On aspirin and Lipitor -Echocardiogram pending  Depression -Continue home medications  ANEMIA, IRON DEFICIENCY -Continue iron supplement  Protein-calorie malnutrition, severe (HCC) Estimated body mass index is 18.3 kg/m as calculated from the following:   Height as of this encounter: 5\' 5"  (1.651 m).   Weight as of this encounter: 49.9 kg.   -Dietary consult   Subjective: Patient was seen and examined today.  She was awake and alert and wants to eat.  Denies any pain.  Physical Exam: Vitals:   06/23/22 0002 06/23/22 0417 06/23/22 0700 06/23/22 1338  BP: (!) 149/58 (!) 152/53 (!) 146/56 (!) 156/79  Pulse: 74 77 75 79  Resp: 20 16 17 16   Temp: 97.7 F (36.5 C) 97.7 F (36.5 C) (!) 97.5 F (36.4 C) 97.9 F (36.6 C)  TempSrc: Oral     SpO2: 100% 98% 98% 96%  Weight:      Height:       General.  Frail and malnourished elderly lady, in no acute distress. Pulmonary.  Lungs clear bilaterally, normal respiratory effort. CV.  Regular rate and rhythm, no JVD, rub or murmur. Abdomen.  Soft, nontender, nondistended, BS positive. CNS.  Alert and oriented to self.  No focal neurologic deficit. Extremities.  No edema, no cyanosis, pulses intact and symmetrical. Psychiatry.  Judgment and insight appears impaired  Data Reviewed: Prior data reviewed  Family Communication: Talked with daughter on phone.  Disposition: Status  is: Inpatient Remains inpatient appropriate because: Severity of illness  Planned Discharge Destination: Skilled nursing facility  DVT prophylaxis.  Lovenox Time spent: 45 minutes  This record has been created using Conservation officer, historic buildings.  Errors have been sought and corrected,but may not always be located. Such creation errors do not reflect on the standard of care.   Author: Arnetha Courser, MD 06/23/2022 4:06 PM  For on call review [email or web address].

## 2022-06-23 NOTE — Assessment & Plan Note (Signed)
-   Continue home medications 

## 2022-06-23 NOTE — Assessment & Plan Note (Signed)
Most likely secondary to demand ischemia.  No chest pain and troponin mildly elevated at 68 with downward trend. -On aspirin and Lipitor -Echocardiogram pending

## 2022-06-23 NOTE — Plan of Care (Signed)
Attempted a 2nd swallow test because pt is much more alert than last night.  Followed all instructions but coughed after taking a sip of water.  Pt will remain NPO until evaluated by Speech.

## 2022-06-23 NOTE — Evaluation (Signed)
Clinical/Bedside Swallow Evaluation Patient Details  Name: Melissa [name] MRN: [id number] Date of Birth: Mar 12, 1928  Today's Date: 06/23/2022 Time: SLP Start Time (ACUTE ONLY): 0805 SLP Stop Time (ACUTE ONLY): 0845 SLP Time Calculation (min) (ACUTE ONLY): 40 min  Past Medical History:  Past Medical History:  Diagnosis Date   Arthritis    Chest pain    a. 05/2015 - post-prandial.   GERD (gastroesophageal reflux disease)    a. takes Omeprazole daily, prev seen by D. Brodie.   Heart murmur    a. 05/2013 Echo: EF 55-60%, gr2 DD, mild AI/MR.   History of blood transfusion    no abnormal reaction noted    History of migraine headaches    a. last 10/2013   Iron deficiency anemia    Joint pain    PONV (postoperative nausea and vomiting)    Rectocele 2011   Past Surgical History:  Past Surgical History:  Procedure Laterality Date   cataract surgery  Bilateral    R 03/2001; L 05/2001.   COLONOSCOPY     HYSTEROSCOPY  11/1999   w/ resection of endometrial polyp by uterine curetting.   INTRAMEDULLARY (IM) NAIL INTERTROCHANTERIC Right 07/15/2020   Procedure: INTRAMEDULLARY (IM) NAIL INTERTROCHANTRIC;  Surgeon: Juanell Fairly, MD;  Location: [facility] ORS;  Service: Orthopedics;  Laterality: Right;   JOINT REPLACEMENT Bilateral R-9/04, L- 8/05   Total Knee Arthroplasties.   KYPHOPLASTY N/A 04/10/2019   Procedure: KYPHOPLASTY;  Surgeon: Kennedy Bucker, MD;  Location: [facility] ORS;  Service: Orthopedics;  Laterality: N/A;   REVERSE SHOULDER ARTHROPLASTY Right 11/26/2013   Procedure: RIGHT REVERSE SHOULDER ARTHROPLASTY;  Surgeon: Senaida Lange, MD;  Location: [facility] OR;  Service: Orthopedics;  Laterality: Right;   SHOULDER ARTHROSCOPY Right    THYROIDECTOMY  at age 61   TONSILLECTOMY     HPI:  Pt is a 89 y.o. female with medical history significant of Vascular Dementia, Falls, iron deficiency anemia, GERD w/ potential Esophageal phase dysmotility(per pt complaint in the past), depression, migraine headache,  rectocele, wheelchair-bound at baseline, who presents with left-sided weakness.   MRI: Acute infarcts in the right corona radiata and inferior right parietal lobe.    Assessment / Plan / Recommendation  Clinical Impression   Pt seen for BSE today. Pt drowsy/sleepy only alerting briefly to MOD verbal/tactile stim. Dysarthric speech; left oral weakness. She did not maintain an alert State independently. Also noted Apnea moments of 12-15+ secs. See MRI results.  On RA; afebrile, wbc WNL.  Pt appears to present w/ oropharyngeal phase dysphagia w/ oropharyngeal phase deficits w/ just trials of single ice chips and suspected Neuromuscular impact. Pt also has declined Cognitive status at Baseline (Vascular Dementia) which could impact her overall awareness/engagement/alertness during a time of trauma/illness.  Pt is at increased risk for aspiration/aspiration pneumonia at this time.   She often had eyes closed during oral care and trials of single ice chips placed at lips. Limited oral engagement to stim and to the ice chips presented. Lengthy oral phase; potential delayed pharyngeal swallowin initiation is suspected. Decreased awareness w/ any oral intake could impact safety and success of pharyngeal swallow. Oral phase was c/b slow, deliberate bolus management and pauses -- unsure if related to oral weakness or decreased attention/awareness.  No further trials given. NSG/MD updated w/ recommendation to remain NPO pending further, ongoing assessment tomorrow. Recommend frequent oral care for hygiene and stimulation of swallowing; aspiration precautions. Precautions posted in room. ST services will f/u tomorrow.  SLP Visit Diagnosis:  Dysphagia, oropharyngeal phase (R13.12)    Aspiration Risk  Moderate aspiration risk;Severe aspiration risk;Risk for inadequate nutrition/hydration    Diet Recommendation   NPO w/ oral care  Medication Administration: Via alternative means    Other  Recommendations  Recommended Consults:  (Dietician; Palliative Care) Oral Care Recommendations: Oral care QID;Staff/trained caregiver to provide oral care (for oral stim and swallowing) Caregiver Recommendations:  (TBD)    Recommendations for follow up therapy are one component of a multi-disciplinary discharge planning process, led by the attending physician.  Recommendations may be updated based on patient status, additional functional criteria and insurance authorization.  Follow up Recommendations Follow physician's recommendations for discharge plan and follow up therapies      Assistance Recommended at Discharge  full  Functional Status Assessment  (cannot determine today)  Frequency and Duration min 3x week  2 weeks       Prognosis Prognosis for improved oropharyngeal function: Guarded Barriers to Reach Goals: Cognitive deficits;Language deficits;Time post onset;Severity of deficits Barriers/Prognosis Comment: Baseline Dementia      Swallow Study   General Date of Onset: 06/22/22 HPI: Pt is a 89 y.o. female with medical history significant of Vascular Dementia, Falls, iron deficiency anemia, GERD w/ potential Esophageal phase dysmotility(per pt complaint in the past), depression, migraine headache, rectocele, wheelchair-bound at baseline, who presents with left-sided weakness.   MRI: Acute infarcts in the right corona radiata and inferior right parietal lobe. Type of Study: Bedside Swallow Evaluation Previous Swallow Assessment: none Diet Prior to this Study: NPO Temperature Spikes Noted: No (wbc 9.2) Respiratory Status: Room air History of Recent Intubation: No Behavior/Cognition: Confused;Lethargic/Drowsy;Requires cueing;Doesn't follow directions (brief eye opening) Oral Cavity Assessment: Dry (sticky) Oral Care Completed by SLP: Yes Oral Cavity - Dentition: Missing dentition (adequate) Vision:  (n/a) Self-Feeding Abilities: Total assist Patient Positioning: Upright in bed Baseline  Vocal Quality:  (mumbled speech; Dysarthria) Volitional Cough: Cognitively unable to elicit Volitional Swallow: Unable to elicit    Oral/Motor/Sensory Function Overall Oral Motor/Sensory Function: Moderate impairment Facial ROM: Reduced left;Suspected CN VII (facial) dysfunction Facial Symmetry: Abnormal symmetry left;Suspected CN VII (facial) dysfunction Facial Strength: Reduced left;Suspected CN VII (facial) dysfunction Lingual ROM:  (generalized weakness but CNT fully)   Ice Chips Ice chips: Impaired Presentation: Spoon (fed; 3 trials attempted) Oral Phase Impairments: Poor awareness of bolus;Reduced labial seal;Reduced lingual movement/coordination Oral Phase Functional Implications: Prolonged oral transit Pharyngeal Phase Impairments:  (no cough; audible, hard swallow)   Thin Liquid Thin Liquid: Not tested    Nectar Thick Nectar Thick Liquid: Not tested   Honey Thick Honey Thick Liquid: Not tested   Puree Puree: Not tested   Solid     Solid: Not tested        Jerilynn Som, MS, CCC-SLP Speech Language Pathologist Rehab Services; [facility] [place] [phone] (ascom) Zeva Leber 06/23/2022,12:28 PM

## 2022-06-23 NOTE — Hospital Course (Addendum)
Taken from H&P.  Melissa [name] is a 89 y.o. female with medical history significant of iron deficiency anemia, GERD, depression, migraine headache, rectocele, wheelchair-bound at baseline, who presents with left-sided weakness from twin [facility].  Data reviewed independently and ED Course: pt was found to have WBC 9.2, GFR> 60, urinalysis (hazy appearance, negative leukocyte, positive nitrite, many bacteria, WBC 6-10).  Temperature normal, blood pressure 136/82, heart rate 76, RR 23, oxygen saturation 97% on room air.  CT of head negative.  MRI showed acute stroke.  Neurology was consulted   MRI-brain: Acute infarcts in the right corona radiata and inferior right parietal lobe. No acute hemorrhage or significant mass effect.   EKG:  Unstable baseline, sinus rhythm, low voltage, anteroseptal infarction pattern, borderline left axis deviation.  5/4: Vital stable.  Failed initial swallow screen in ED.  Neurology is recommending complete stroke workup.  Only aspirin to decrease the risk of bleeding due to her frailty, Plavix was not added.  CTA head and neck with no large vessel occlusion. Moderate narrowing in the proximal supraclinoid right ICA. No hemodynamically significant stenosis in the neck.  A1c of 5.2, troponin 68>>>60>>52.  Urine cultures pending.  Lipid panel normal. Echocardiogram pending Pending swallow evaluation by swallow team.  5/5: Vital stable.  Swallow team is recommending dysphagia diet with nectar thick liquid. PT is recommending SNF-TOC is aware.  Patient is from twin [facility] Pending echocardiogram

## 2022-06-24 ENCOUNTER — Inpatient Hospital Stay
Admit: 2022-06-24 | Discharge: 2022-06-24 | Disposition: A | Discharge status: Home / Self Care | Payer: Medicare Other | Attending: Neurology | Admitting: Neurology

## 2022-06-24 DIAGNOSIS — E43 Unspecified severe protein-calorie malnutrition: Secondary | ICD-10-CM | POA: Diagnosis not present

## 2022-06-24 DIAGNOSIS — N3000 Acute cystitis without hematuria: Secondary | ICD-10-CM | POA: Diagnosis not present

## 2022-06-24 DIAGNOSIS — D509 Iron deficiency anemia, unspecified: Secondary | ICD-10-CM | POA: Diagnosis not present

## 2022-06-24 DIAGNOSIS — I639 Cerebral infarction, unspecified: Secondary | ICD-10-CM | POA: Diagnosis not present

## 2022-06-24 LAB — URINE CULTURE: Culture: >=100000 — AB

## 2022-06-24 MED ORDER — CEPHALEXIN 500 MG PO CAPS
500.0000 mg | ORAL_CAPSULE | Freq: Three times a day (TID) | ORAL | Status: DC
  Administered 2022-06-24 – 2022-06-25 (×4): 500 mg via ORAL
  Filled 2022-06-24 (×4): qty 1

## 2022-06-24 NOTE — Assessment & Plan Note (Signed)
MRI showed acute infarcts in the right corona radiata and inferior right parietal lobe.  Failed initial swallow evaluation, swallow team recommended dysphagia diet with nectar thick liquid CTA negative for any large vessel occlusion.  Neurology is on board. -Permissive hypertension -Aspirin and Lipitor -Avoiding Plavix due to fragility. -A1c 5.2, lipid panel normal -PT and OT are recommending SNF -Pending echocardiogram,

## 2022-06-24 NOTE — [place] FL2 (Signed)
[place] MEDICAID FL2 LEVEL OF CARE FORM     IDENTIFICATION  Patient Name: Melissa [name] Birthdate: 1928-02-25 Sex: female Admission Date (Current Location): 06/22/2022  [facility] and [place] Number:  Chiropodist and Address:  [facility], [address], [place], [place] [address]      Provider Number: [id number]  Attending Physician Name and Address:  Arnetha Courser, MD  Relative Name and Phone Number:  Mylei, Meikle [facility]) [phone] [facility] Phone)    Current Level of Care: Hospital Recommended Level of Care: Skilled Nursing Facility Prior Approval Number:    Date Approved/Denied:   PASRR Number: [id number]  Discharge Plan:      Current Diagnoses: Patient Active Problem List   Diagnosis Date Noted   Stroke [facility]) 06/22/2022   Depression 06/22/2022   Protein-calorie malnutrition, severe (HCC) 06/22/2022   Myocardial injury 06/22/2022   UTI (urinary tract infection) 06/22/2021   Fall at home, initial encounter 06/22/2021   GERD without esophagitis 06/22/2021   Peripheral neuropathy 06/22/2021   Closed intertrochanteric fracture of hip, right, initial encounter (HCC) 07/14/2020 07/14/2020   Hardening of the aorta (main artery of the heart) (HCC) 07/14/2020   Accidental fall 07/14/2020   History of reverse total replacement of right shoulder joint 10/22/2019   Compression fx, lumbar spine (HCC) 04/11/2019 04/11/2019   Intractable low back pain 04/09/2019   Compression fracture of lumbosacral vertebra, nontraumatic, initial encounter with retropulsion 04/09/2019 04/09/2019   Sacral insufficiency fracture 04/09/2019 04/09/2019   AI (aortic incompetence) 12/30/2017   OP (osteoporosis) 12/30/2017   S/P shoulder replacement 11/26/2013   ANEMIA, IRON DEFICIENCY 04/05/2009   HEMORRHOIDS, INTERNAL 04/05/2009   GERD 04/05/2009   CONSTIPATION 04/05/2009   ARTHRITIS 04/05/2009    Orientation RESPIRATION BLADDER Height & Weight     Self,  Time  Normal Incontinent, External catheter Weight: 110 lb (49.9 kg) Height:  5\' 5"  (165.1 cm)  BEHAVIORAL SYMPTOMS/MOOD NEUROLOGICAL BOWEL NUTRITION STATUS        Diet (dys 1, nectar thick)  AMBULATORY STATUS COMMUNICATION OF NEEDS Skin       Normal                       Personal Care Assistance Level of Assistance  Bathing, Feeding, Dressing Bathing Assistance: Maximum assistance Feeding assistance: Maximum assistance Dressing Assistance: Maximum assistance     Functional Limitations Info             SPECIAL CARE FACTORS FREQUENCY  PT (By licensed PT), OT (By licensed OT)     PT Frequency: 5 times per week OT Frequency: 5 times per week            Contractures      Additional Factors Info  Code Status, Allergies Code Status Info: DNR Allergies Info: Sulfonamide Derivatives, Sulfa Antibiotics           Current Medications (06/24/2022):  This is the current hospital active medication list Current Facility-Administered Medications  Medication Dose Route Frequency Provider Last Rate Last Admin   0.9 %  sodium chloride infusion   Intravenous Continuous Lorretta Harp, MD 75 mL/hr at 06/24/22 0629 New Bag at 06/24/22 8469   acetaminophen (TYLENOL) tablet 650 mg  650 mg Oral Q4H PRN Lorretta Harp, MD       Or   acetaminophen (TYLENOL) 160 MG/5ML solution 650 mg  650 mg Per Tube Q4H PRN Lorretta Harp, MD       Or   acetaminophen (TYLENOL)  suppository 650 mg  650 mg Rectal Q4H PRN Lorretta Harp, MD       aspirin suppository 300 mg  300 mg Rectal Daily Lorretta Harp, MD   300 mg at 06/24/22 1610   Or   aspirin tablet 325 mg  325 mg Oral Daily Lorretta Harp, MD       atorvastatin (LIPITOR) tablet 40 mg  40 mg Oral Daily Lorretta Harp, MD       bismuth subsalicylate (PEPTO BISMOL) 262 MG/15ML suspension 30 mL  30 mL Oral Q4H PRN Lorretta Harp, MD       calcium carbonate (TUMS - dosed in mg elemental calcium) chewable tablet 600 mg of elemental calcium  600 mg of elemental calcium Oral Daily  Orson Aloe, RPH       cefTRIAXone (ROCEPHIN) 1 g in sodium chloride 0.9 % 100 mL IVPB  1 g Intravenous Q24H Lorretta Harp, MD   Stopped at 06/24/22 0251   cholecalciferol (VITAMIN D3) 25 MCG (1000 UNIT) tablet 1,000 Units  1,000 Units Oral Daily Lorretta Harp, MD       enoxaparin (LOVENOX) injection 40 mg  40 mg Subcutaneous Q24H Lorretta Harp, MD   40 mg at 06/23/22 2136   ferrous sulfate tablet 325 mg  325 mg Oral Daily Lorretta Harp, MD       mirabegron [facility]) tablet 25 mg  25 mg Oral Daily Lorretta Harp, MD       mirtazapine (REMERON) tablet 7.5 mg  7.5 mg Oral QHS Lorretta Harp, MD       ondansetron [facility]) injection 4 mg  4 mg Intravenous Q8H PRN Lorretta Harp, MD       polyethylene glycol (MIRALAX / GLYCOLAX) packet 17 g  17 g Oral Daily PRN Lorretta Harp, MD       saccharomyces boulardii (FLORASTOR) capsule 250 mg  250 mg Oral Daily Lorretta Harp, MD       senna-docusate (Senokot-S) tablet 1 tablet  1 tablet Oral QHS PRN Lorretta Harp, MD         Discharge Medications: Please see discharge summary for a list of discharge medications.  Relevant Imaging Results:  Relevant Lab Results:   Additional Information SS #: [id number]  Raeford Brandenburg E Reinhardt Licausi, LCSW

## 2022-06-24 NOTE — TOC CM/SW Note (Addendum)
Reached out to [facility], Sue Lush in Admissions confirms patient is a long term care resident there.  TOC to follow up after PT evals.  Per Sue Lush at [facility], patient can receive PT/OT when she returns if needed if she has a qualifying 3 night stay.   Alfonso Ramus, [place] [phone]

## 2022-06-24 NOTE — Assessment & Plan Note (Signed)
UA concerning for UTI.  Urine cultures with E. coli showing resistance only to ampicillin. -Continue with ceftriaxone-will discharge on Keflex to complete the course

## 2022-06-24 NOTE — Evaluation (Signed)
Physical Therapy Evaluation Patient Details Name: Melissa [name] MRN: [id number] DOB: December 17, 1928 Today's Date: 06/24/2022  History of Present Illness  Pt is a 89 yo female that presented from ALF due to L sided weakness. Workup showed  small acute ischemic infarctions in the right parietal lobe and corona radiata. U/A was also consistent with a UTI. PMH of GERD, heart murmuer, arthritis, kyphoplasty, R reverse TSA and per chart wheelchair bound at baseline.   Clinical Impression  Patient alert, but oriented to self only, confusion noted with some tangential speech. Pt unable to provide further PLOF, but per chart pt at [facility], stand pivots at baseline, but previously able to participate more in transfers. MaxA to come up into sitting EOB, noted for L lateral lean throughout, CGA-minA for safety throughout. Sit <> stand and squat pivot, stand pivot from EOB, recliner, to BSC. maxA and pt unable to truly take any steps.  Overall the patient demonstrated deficits (see "PT Problem List") that impede the patient's functional abilities, safety, and mobility and would benefit from skilled PT intervention. Recommendation is to continue skilled PT intervention to maximize pt function and participation.        Recommendations for follow up therapy are one component of a multi-disciplinary discharge planning process, led by the attending physician.  Recommendations may be updated based on patient status, additional functional criteria and insurance authorization.  Follow Up Recommendations Can patient physically be transported by private vehicle: No     Assistance Recommended at Discharge Frequent or constant Supervision/Assistance  Patient can return home with the following  A lot of help with bathing/dressing/bathroom;Assistance with cooking/housework;Direct supervision/assist for medications management;Assist for transportation;Help with stairs or ramp for entrance;A lot of help with walking and/or  transfers    Equipment Recommendations None recommended by PT  Recommendations for Other Services       Functional Status Assessment Patient has had a recent decline in their functional status and demonstrates the ability to make significant improvements in function in a reasonable and predictable amount of time.     Precautions / Restrictions Precautions Precautions: Fall Restrictions Weight Bearing Restrictions: No      Mobility  Bed Mobility Overal bed mobility: Needs Assistance Bed Mobility: Supine to Sit     Supine to sit: Max assist, HOB elevated          Transfers Overall transfer level: Needs assistance   Transfers: Bed to chair/wheelchair/BSC   Stand pivot transfers: Max assist   Squat pivot transfers: Max assist     General transfer comment: difficulty with stepping, maxA to recliner, BSC    Ambulation/Gait               General Gait Details: unable  Stairs            Wheelchair Mobility    Modified Rankin (Stroke Patients Only)       Balance Overall balance assessment: Needs assistance Sitting-balance support: Feet supported Sitting balance-Leahy Scale: Poor   Postural control: Left lateral lean   Standing balance-Leahy Scale: Zero                               Pertinent Vitals/Pain Pain Assessment Pain Assessment: No/denies pain    Home Living Family/patient expects to be discharged to:: Skilled nursing facility                   Additional Comments: Pt has resided in the  [facility] for the last year.  Husband lives in a home in the [facility] community and visits her daily.    Prior Function Prior Level of Function : Needs assist  Cognitive Assist : Mobility (cognitive);ADLs (cognitive) Mobility (Cognitive): Step by step cues ADLs (Cognitive): Step by step cues Physical Assist : Mobility (physical);ADLs (physical) Mobility (physical): Transfers ADLs (physical):  Feeding;Grooming;Bathing;Dressing;Toileting;IADLs Mobility Comments: Husband reports patient is w/c bound but able to assist in the past with transfers ADLs Comments: At baseline, pt feed herself, requires assist for bathing, dressing and toileting.  She requires assist for transfers.     Hand Dominance   Dominant Hand: Right    Extremity/Trunk Assessment   Upper Extremity Assessment Upper Extremity Assessment: Defer to OT evaluation    Lower Extremity Assessment Lower Extremity Assessment: Difficult to assess due to impaired cognition (limited ability to follow commands, but able to LAQ and ankle pump bilaterally)       Communication   Communication: No difficulties  Cognition Arousal/Alertness: Awake/alert Behavior During Therapy: WFL for tasks assessed/performed Overall Cognitive Status: History of cognitive impairments - at baseline                                 General Comments: Pt oriented to self, unable to state, month/day/year or where she is at currently. noted for tangential conversation        General Comments      Exercises     Assessment/Plan    PT Assessment Patient needs continued PT services  PT Problem List Decreased strength;Decreased mobility;Decreased activity tolerance;Decreased balance       PT Treatment Interventions DME instruction;Therapeutic activities;Gait training;Patient/family education;Therapeutic exercise;Stair training;Balance training;Functional mobility training;Neuromuscular re-education    PT Goals (Current goals can be found in the Care Plan section)  Acute Rehab PT Goals PT Goal Formulation: Patient unable to participate in goal setting Time For Goal Achievement: 07/08/22 Potential to Achieve Goals: Fair    Frequency Min 2X/week     Co-evaluation               AM-PAC PT "6 Clicks" Mobility  Outcome Measure Help needed turning from your back to your side while in a flat bed without using  bedrails?: A Lot Help needed moving from lying on your back to sitting on the side of a flat bed without using bedrails?: A Lot Help needed moving to and from a bed to a chair (including a wheelchair)?: A Lot Help needed standing up from a chair using your arms (e.g., wheelchair or bedside chair)?: A Lot Help needed to walk in hospital room?: Total Help needed climbing 3-5 steps with a railing? : Total 6 Click Score: 10    End of Session Equipment Utilized During Treatment: Gait belt Activity Tolerance: Patient tolerated treatment well Patient left: Other (comment);with nursing/sitter in room (on [facility] with RN/tech) Nurse Communication: Mobility status PT Visit Diagnosis: Other abnormalities of gait and mobility (R26.89);Difficulty in walking, not elsewhere classified (R26.2)    Time: 1610-9604 PT Time Calculation (min) (ACUTE ONLY): 21 min   Charges:   PT Evaluation $PT Eval Moderate Complexity: 1 Mod PT Treatments $Therapeutic Activity: 8-22 mins        Olga Coaster PT, DPT 11:39 AM,06/24/22

## 2022-06-24 NOTE — Progress Notes (Signed)
Progress Note   Patient: Melissa [name] WJX:[id number] DOB: [birth year] DOA: 06/22/2022     2 DOS: the patient was seen and examined on 06/24/2022   Brief hospital course: Taken from H&P.  CAI BABAUTA is a 89 y.o. female with medical history significant of iron deficiency anemia, GERD, depression, migraine headache, rectocele, wheelchair-bound at baseline, who presents with left-sided weakness from [facility].  Data reviewed independently and ED Course: pt was found to have WBC 9.2, GFR> 60, urinalysis (hazy appearance, negative leukocyte, positive nitrite, many bacteria, WBC 6-10).  Temperature normal, blood pressure 136/82, heart rate 76, RR 23, oxygen saturation 97% on room air.  CT of head negative.  MRI showed acute stroke.  Neurology was consulted   MRI-brain: Acute infarcts in the right corona radiata and inferior right parietal lobe. No acute hemorrhage or significant mass effect.   EKG:  Unstable baseline, sinus rhythm, low voltage, anteroseptal infarction pattern, borderline left axis deviation.  5/4: Vital stable.  Failed initial swallow screen in ED.  Neurology is recommending complete stroke workup.  Only aspirin to decrease the risk of bleeding due to her frailty, Plavix was not added.  CTA head and neck with no large vessel occlusion. Moderate narrowing in the proximal supraclinoid right ICA. No hemodynamically significant stenosis in the neck.  A1c of 5.2, troponin 68>>>60>>52.  Urine cultures pending.  Lipid panel normal. Echocardiogram pending Pending swallow evaluation by swallow team.  5/5: Vital stable.  Swallow team is recommending dysphagia diet with nectar thick liquid. PT is recommending SNF-TOC is aware.  Patient is from twin [facility]F Pending echocardiogram   Assessment and Plan: * Stroke [facility]) MRI showed acute infarcts in the right corona radiata and inferior right parietal lobe.  Failed initial swallow evaluation, swallow team recommended dysphagia diet with  nectar thick liquid CTA negative for any large vessel occlusion.  Neurology is on board. -Permissive hypertension -Aspirin and Lipitor -Avoiding Plavix due to fragility. -A1c 5.2, lipid panel normal -PT and OT are recommending SNF -Pending echocardiogram,   UTI (urinary tract infection) UA concerning for UTI.  Urine cultures with E. coli showing resistance only to ampicillin. -Continue with ceftriaxone-will discharge on Keflex to complete the course  Myocardial injury Most likely secondary to demand ischemia.  No chest pain and troponin mildly elevated at 68 with downward trend. -On aspirin and Lipitor -Echocardiogram pending  Depression -Continue home medications  ANEMIA, IRON DEFICIENCY -Continue iron supplement  Protein-calorie malnutrition, severe (HCC) Estimated body mass index is 18.3 kg/m as calculated from the following:   Height as of this encounter: 5\' 5"  (1.651 m).   Weight as of this encounter: 49.9 kg.   -Dietary consult   Subjective: Patient was sleeping in chair when seen today.  Arousable but wants to get more sleep.  Very poor effort for CNS exam due to being somnolent.  Physical Exam: Vitals:   06/24/22 0028 06/24/22 0445 06/24/22 0700 06/24/22 1100  BP: (!) 145/67 123/79 122/72 124/70  Pulse: 84 85 86 85  Resp: 16 20 18 17   Temp: 98.1 F (36.7 C) 99.1 F (37.3 C) 98.6 F (37 C) 98.6 F (37 C)  TempSrc:      SpO2: 96% 98% 98% 98%  Weight:      Height:       General.  Frail elderly lady, in no acute distress. Pulmonary.  Lungs clear bilaterally, normal respiratory effort. CV.  Regular rate and rhythm, no JVD, rub or murmur. Abdomen.  Soft,  nontender, nondistended, BS positive. CNS.  Somnolent.  Very poor effort for CNS exam. Extremities.  No edema, no cyanosis, pulses intact and symmetrical. Psychiatry.  Appears to have some cognitive impairment  Data Reviewed: Prior data reviewed  Family Communication: Talked with daughter on  phone.  Disposition: Status is: Inpatient Remains inpatient appropriate because: Severity of illness  Planned Discharge Destination: Skilled nursing facility  DVT prophylaxis.  Lovenox Time spent: 40 minutes  This record has been created using Conservation officer, historic buildings. Errors have been sought and corrected,but may not always be located. Such creation errors do not reflect on the standard of care.   Author: Arnetha Courser, MD 06/24/2022 2:45 PM  For on call review [email or web address].

## 2022-06-24 NOTE — TOC Progression Note (Signed)
Transition of Care [facility]) - Progression Note    Patient Details  Name: Melissa [name] MRN: [id number] Date of Birth: 19-Jan-1929  Transition of Care [facility]) CM/SW Contact  Liliana Cline, LCSW Phone Number: 06/24/2022, 11:57 AM  Clinical Narrative:    CSW notified Sue Lush with [facility] of recommendation for SNF when patient returns to [facility].         Expected Discharge Plan and Services                                               Social Determinants of Health (SDOH) Interventions SDOH Screenings   Food Insecurity: No Food Insecurity (06/23/2022)  Housing: Low Risk  (06/23/2022)  Transportation Needs: No Transportation Needs (06/23/2022)  Utilities: Not At Risk (06/23/2022)  Tobacco Use: Low Risk  (06/23/2022)    Readmission Risk Interventions     No data to display

## 2022-06-24 NOTE — Progress Notes (Addendum)
Speech Language Pathology Treatment: Dysphagia  Patient Details Name: Melissa [name] MRN: [id number] DOB: Jul 12, 1928 Today's Date: 06/24/2022 Time: 9562-1308 SLP Time Calculation (min) (ACUTE ONLY): 22 min  Assessment / Plan / Recommendation Clinical Impression  Pt seen for clinical swallowing evaluation. Pt sleeping in recliner, but roused with verbal cues. Pt alert and cooperative. Health and safety inspector for POs. Some confusion noted. Pt with preference to have neck in extension despite repositioning efforts. Pt given trials of solid, puree, nectar-thick liquids (via tsp, cup, straw), thin liquids (via tsp, cup, straw), and ice chips. Pt dependent for feeding. Pt presents with s/sx moderate oral dysphagia c/b prolonged and inefficient mastication of solids. Concern for highly suspected pharyngeal dysphagia c/b immediate coughing with thin liquids via cup and straw sip. No overt s/sx across other consistencies. Recommend initiation of a puree diet with nectar-thick liquids with safe swallowing strategies/aspiration precautions as outlined below. Consideration for MBSS if clinical s/sx do not improve given clinical risk factors and medical hx/comorbidities. SLP to f/u next date for dysphagia management.    HPI HPI: Pt is a 89 y.o. female with medical history significant of Vascular Dementia, Falls, iron deficiency anemia, GERD w/ potential Esophageal phase dysmotility(per pt complaint in the past), depression, migraine headache, rectocele, wheelchair-bound at baseline, who presents with left-sided weakness.   MRI: Acute infarcts in the right corona radiata and inferior right parietal lobe.      SLP Plan  Continue with current plan of care;MBS (consider MBSS if clinical s/sx do not improve)      Recommendations for follow up therapy are one component of a multi-disciplinary discharge planning process, led by the attending physician.  Recommendations may be updated based on patient status, additional functional  criteria and insurance authorization.    Recommendations  Diet recommendations: Dysphagia 1 (puree);Nectar-thick liquid Liquids provided via: Teaspoon;Cup;Straw Medication Administration: Crushed with puree Supervision: Trained caregiver to feed patient Compensations: Slow rate;Small sips/bites Postural Changes and/or Swallow Maneuvers: Out of bed for meals;Seated upright 90 degrees;Upright 30-60 min after meal                  Oral care QID;Staff/trained caregiver to provide oral care   Frequent or constant Supervision/Assistance Dysphagia, oropharyngeal phase (R13.12)     Continue with current plan of care;MBS (consider MBSS if clinical s/sx do not improve)    Clyde Canterbury, M.S., CCC-SLP Speech-Language Pathologist [facility] [phone] Arnette Felts)  Woodroe Chen  06/24/2022, 11:30 AM

## 2022-06-24 NOTE — Progress Notes (Signed)
Unable to do Bubble study. RN not able to start IV.   Dondra Prader RVT RCS

## 2022-06-25 ENCOUNTER — Inpatient Hospital Stay (HOSPITAL_COMMUNITY)
Admit: 2022-06-25 | Discharge: 2022-06-25 | Disposition: A | Discharge status: Home / Self Care | Payer: Medicare Other | Attending: Neurology | Admitting: Neurology

## 2022-06-25 DIAGNOSIS — N3281 Overactive bladder: Secondary | ICD-10-CM | POA: Diagnosis not present

## 2022-06-25 DIAGNOSIS — R2689 Other abnormalities of gait and mobility: Secondary | ICD-10-CM | POA: Diagnosis not present

## 2022-06-25 DIAGNOSIS — I69354 Hemiplegia and hemiparesis following cerebral infarction affecting left non-dominant side: Secondary | ICD-10-CM | POA: Diagnosis not present

## 2022-06-25 DIAGNOSIS — M6281 Muscle weakness (generalized): Secondary | ICD-10-CM | POA: Diagnosis not present

## 2022-06-25 DIAGNOSIS — I6389 Other cerebral infarction: Secondary | ICD-10-CM | POA: Diagnosis not present

## 2022-06-25 DIAGNOSIS — I639 Cerebral infarction, unspecified: Secondary | ICD-10-CM | POA: Diagnosis not present

## 2022-06-25 DIAGNOSIS — M8448XG Pathological fracture, other site, subsequent encounter for fracture with delayed healing: Secondary | ICD-10-CM | POA: Diagnosis not present

## 2022-06-25 DIAGNOSIS — N3000 Acute cystitis without hematuria: Secondary | ICD-10-CM | POA: Diagnosis not present

## 2022-06-25 DIAGNOSIS — F0150 Vascular dementia without behavioral disturbance: Secondary | ICD-10-CM | POA: Diagnosis not present

## 2022-06-25 DIAGNOSIS — Z7401 Bed confinement status: Secondary | ICD-10-CM | POA: Diagnosis not present

## 2022-06-25 DIAGNOSIS — M810 Age-related osteoporosis without current pathological fracture: Secondary | ICD-10-CM | POA: Diagnosis not present

## 2022-06-25 DIAGNOSIS — M48061 Spinal stenosis, lumbar region without neurogenic claudication: Secondary | ICD-10-CM | POA: Diagnosis not present

## 2022-06-25 DIAGNOSIS — Z66 Do not resuscitate: Secondary | ICD-10-CM | POA: Diagnosis not present

## 2022-06-25 DIAGNOSIS — R404 Transient alteration of awareness: Secondary | ICD-10-CM | POA: Diagnosis not present

## 2022-06-25 DIAGNOSIS — M5416 Radiculopathy, lumbar region: Secondary | ICD-10-CM | POA: Diagnosis not present

## 2022-06-25 DIAGNOSIS — M4857XA Collapsed vertebra, not elsewhere classified, lumbosacral region, initial encounter for fracture: Secondary | ICD-10-CM | POA: Diagnosis not present

## 2022-06-25 DIAGNOSIS — E43 Unspecified severe protein-calorie malnutrition: Secondary | ICD-10-CM | POA: Diagnosis not present

## 2022-06-25 DIAGNOSIS — S42031D Displaced fracture of lateral end of right clavicle, subsequent encounter for fracture with routine healing: Secondary | ICD-10-CM | POA: Diagnosis not present

## 2022-06-25 DIAGNOSIS — Z9181 History of falling: Secondary | ICD-10-CM | POA: Diagnosis not present

## 2022-06-25 DIAGNOSIS — R4189 Other symptoms and signs involving cognitive functions and awareness: Secondary | ICD-10-CM | POA: Diagnosis not present

## 2022-06-25 DIAGNOSIS — I959 Hypotension, unspecified: Secondary | ICD-10-CM | POA: Diagnosis not present

## 2022-06-25 DIAGNOSIS — Z8673 Personal history of transient ischemic attack (TIA), and cerebral infarction without residual deficits: Secondary | ICD-10-CM | POA: Diagnosis not present

## 2022-06-25 DIAGNOSIS — M1990 Unspecified osteoarthritis, unspecified site: Secondary | ICD-10-CM | POA: Diagnosis not present

## 2022-06-25 DIAGNOSIS — Z741 Need for assistance with personal care: Secondary | ICD-10-CM | POA: Diagnosis not present

## 2022-06-25 DIAGNOSIS — S72141A Displaced intertrochanteric fracture of right femur, initial encounter for closed fracture: Secondary | ICD-10-CM | POA: Diagnosis not present

## 2022-06-25 DIAGNOSIS — K5900 Constipation, unspecified: Secondary | ICD-10-CM | POA: Diagnosis not present

## 2022-06-25 DIAGNOSIS — R2681 Unsteadiness on feet: Secondary | ICD-10-CM | POA: Diagnosis not present

## 2022-06-25 DIAGNOSIS — F32A Depression, unspecified: Secondary | ICD-10-CM | POA: Diagnosis not present

## 2022-06-25 DIAGNOSIS — R531 Weakness: Secondary | ICD-10-CM | POA: Diagnosis not present

## 2022-06-25 DIAGNOSIS — N390 Urinary tract infection, site not specified: Secondary | ICD-10-CM | POA: Diagnosis not present

## 2022-06-25 DIAGNOSIS — F01A18 Vascular dementia, mild, with other behavioral disturbance: Secondary | ICD-10-CM | POA: Diagnosis not present

## 2022-06-25 DIAGNOSIS — B9620 Unspecified Escherichia coli [E. coli] as the cause of diseases classified elsewhere: Secondary | ICD-10-CM | POA: Diagnosis not present

## 2022-06-25 DIAGNOSIS — I69398 Other sequelae of cerebral infarction: Secondary | ICD-10-CM | POA: Diagnosis not present

## 2022-06-25 DIAGNOSIS — F01A3 Vascular dementia, mild, with mood disturbance: Secondary | ICD-10-CM | POA: Diagnosis not present

## 2022-06-25 DIAGNOSIS — R233 Spontaneous ecchymoses: Secondary | ICD-10-CM | POA: Diagnosis not present

## 2022-06-25 DIAGNOSIS — D509 Iron deficiency anemia, unspecified: Secondary | ICD-10-CM | POA: Diagnosis not present

## 2022-06-25 LAB — ECHOCARDIOGRAM COMPLETE BUBBLE STUDY: S' Lateral: 2.8 cm

## 2022-06-25 MED ORDER — CEPHALEXIN 500 MG PO CAPS: 500.0000 mg | ORAL_CAPSULE | Freq: Three times a day (TID) | ORAL | 0 refills | Status: DC

## 2022-06-25 MED ORDER — ASPIRIN 81 MG PO CHEW: 81.0000 mg | CHEWABLE_TABLET | Freq: Every day | ORAL | 2 refills | Status: DC

## 2022-06-25 MED ORDER — ATORVASTATIN CALCIUM 40 MG PO TABS: 40.0000 mg | ORAL_TABLET | Freq: Every day | ORAL | Status: DC

## 2022-06-25 MED ORDER — ASPIRIN 325 MG PO TABS: 325.0000 mg | ORAL_TABLET | Freq: Every day | ORAL | Status: DC

## 2022-06-25 NOTE — Care Management Important Message (Signed)
Important Message  Patient Details  Name: [name] MRN: [id number] Date of Birth: 1928/03/03   Medicare Important Message Given:  Yes  Patient asleep upon time of visit, no family in room.  Copy of Medicare IM left in room for reference.   [name] 06/25/2022, 1:15 PM

## 2022-06-25 NOTE — Discharge Summary (Addendum)
Physician Discharge Summary   Patient: Melissa [name] MRN: [id number] DOB: 23-Mar-1928  Admit date:     06/22/2022  Discharge date: 06/25/22  Discharge Physician: Arnetha Courser   PCP: Earnestine Mealing, MD   Recommendations at discharge:  Please obtain CBC and BMP in 1 week Follow-up with primary care provider Follow-up with neurology for stroke follow up Patient is being discharged on 3 days of Keflex-please ensure the completion of course  Discharge Diagnoses: Principal Problem:   Stroke [facility]) Active Problems:   UTI (urinary tract infection)   Myocardial injury   Depression   ANEMIA, IRON DEFICIENCY   Protein-calorie malnutrition, severe (HCC)  Resolved Problems:   * No resolved hospital problems. [facility] Course: Taken from H&P.  Melissa [name] is a 89 y.o. female with medical history significant of iron deficiency anemia, GERD, depression, migraine headache, rectocele, wheelchair-bound at baseline, who presents with left-sided weakness from [facility].  Data reviewed independently and ED Course: pt was found to have WBC 9.2, GFR> 60, urinalysis (hazy appearance, negative leukocyte, positive nitrite, many bacteria, WBC 6-10).  Temperature normal, blood pressure 136/82, heart rate 76, RR 23, oxygen saturation 97% on room air.  CT of head negative.  MRI showed acute stroke.  Neurology was consulted   MRI-brain: Acute infarcts in the right corona radiata and inferior right parietal lobe. No acute hemorrhage or significant mass effect.   EKG:  Unstable baseline, sinus rhythm, low voltage, anteroseptal infarction pattern, borderline left axis deviation.  5/4: Vital stable.  Failed initial swallow screen in ED.  Neurology is recommending complete stroke workup.  Only aspirin to decrease the risk of bleeding due to her frailty, Plavix was not added.  CTA head and neck with no large vessel occlusion. Moderate narrowing in the proximal supraclinoid right ICA. No hemodynamically  significant stenosis in the neck.  A1c of 5.2, troponin 68>>>60>>52.  Urine cultures pending.  Lipid panel normal. Echocardiogram pending Pending swallow evaluation by swallow team.  5/5: Vital stable.  Swallow team is recommending dysphagia diet with nectar thick liquid. PT is recommending SNF-TOC is aware.  [facility] is from [facility]  5/6: Patient remained hemodynamically stable, echocardiogram without any significant abnormality.  Bubble studies result pending. Urine cultures with E. coli, only resistant to ampicillin.  Patient received ceftriaxone initially followed by Keflex to complete a total of 5-day course.  She is being discharged on 3 more days of Keflex.  Patient will have dysphagia diet with nectar thick liquid on discharge. She is being discharged to skilled nursing facility for rehab. She will continue on  aspirin only.  DAPT was not given due to her risk of fall with advanced age. She was also started on Lipitor, PCP can stop if needed as benefit might be limited due to advanced age.  Patient will continue on current medications and need to have a close follow-up with her providers for further recommendations.  Patient need to be on a dysphagia diet with nectar thick liquid.  She will get benefit from follow-up of swallow team at facility.  Husband was present at bedside on the day of discharge and agreeable on the above-mentioned management.  Patient is high risk for mortality and deterioration based on advanced age and underlying comorbidities.  Assessment and Plan: * Stroke [facility]) MRI showed acute infarcts in the right corona radiata and inferior right parietal lobe.  Failed initial swallow evaluation, swallow team recommended dysphagia diet with nectar thick liquid CTA negative for  any large vessel occlusion.  Neurology is on board. -Permissive hypertension -Aspirin and Lipitor -Avoiding Plavix due to fragility. -A1c 5.2, lipid panel normal -PT and OT are  recommending SNF -Pending echocardiogram,   UTI (urinary tract infection) UA concerning for UTI.  Urine cultures with E. coli showing resistance only to ampicillin. -Continue with ceftriaxone-will discharge on Keflex to complete the course  Myocardial injury Most likely secondary to demand ischemia.  No chest pain and troponin mildly elevated at 68 with downward trend. -On aspirin and Lipitor -Echocardiogram pending  Depression -Continue home medications  ANEMIA, IRON DEFICIENCY -Continue iron supplement  Protein-calorie malnutrition, severe (HCC) Estimated body mass index is 18.3 kg/m as calculated from the following:   Height as of this encounter: 5\' 5"  (1.651 m).   Weight as of this encounter: 49.9 kg.   -Dietary consult   Consultants: Neurology Procedures performed: None Disposition: Skilled nursing facility Diet recommendation: Dysphagia 1 with nectar thick liquid Discharge Diet Orders (From admission, onward)     Start     Ordered   06/25/22 0000  Diet - low sodium heart healthy        06/25/22 1220           Cardiac diet DISCHARGE MEDICATION: Allergies as of 06/25/2022       Reactions   Sulfonamide Derivatives Hives   Sulfa Antibiotics Other (See Comments), Rash        Medication List     TAKE these medications    acetaminophen 500 MG tablet Commonly known as: TYLENOL Take 1,000 mg by mouth 3 (three) times daily.   aspirin 81 MG chewable tablet Commonly known as: Aspirin Childrens Chew 1 tablet (81 mg total) by mouth daily.   aspirin 325 MG tablet Take 1 tablet (325 mg total) by mouth daily. Start taking on: Jun 26, 2022   atorvastatin 40 MG tablet Commonly known as: LIPITOR Take 1 tablet (40 mg total) by mouth daily. Start taking on: Jun 26, 2022   bismuth subsalicylate 262 MG/15ML suspension Commonly known as: PEPTO BISMOL Take 30 mLs by mouth every 4 (four) hours as needed.   calcium carbonate 1500 (600 Ca) MG Tabs tablet Commonly  known as: OSCAL Take 600 mg of elemental calcium by mouth daily.   cephALEXin 500 MG capsule Commonly known as: KEFLEX Take 1 capsule (500 mg total) by mouth every 8 (eight) hours for 3 days.   cholecalciferol 1000 units tablet Commonly known as: VITAMIN D Take 1,000 Units by mouth daily.   ferrous sulfate 325 (65 FE) MG tablet Take 1 tablet (325 mg total) by mouth every other day.   mirabegron ER 25 MG Tb24 tablet Commonly known as: MYRBETRIQ Take 25 mg by mouth daily.   mirtazapine 7.5 MG tablet Commonly known as: REMERON Take 7.5 mg by mouth at bedtime.   polyethylene glycol 17 g packet Commonly known as: MIRALAX / GLYCOLAX Take 17 g by mouth every other day.   saccharomyces boulardii 250 MG capsule Commonly known as: FLORASTOR Take 250 mg by mouth daily.   UNABLE TO FIND Take 4 oz by mouth every evening. Med Name: Medpass        Contact information for follow-up providers     Earnestine Mealing, MD. Schedule an appointment as soon as possible for a visit in 1 week(s).   Specialty: Family Medicine Contact information: [address] [place] [address] [phone]              Contact information  for after-discharge care     Destination     [facility] .   Service: Skilled Nursing Contact information: [address] [place] [address] [phone]                    Discharge Exam: Ceasar Mons Weights   06/22/22 1352  Weight: 49.9 kg   General.  Frail and malnourished elderly lady, in no acute distress. Pulmonary.  Lungs clear bilaterally, normal respiratory effort. CV.  Regular rate and rhythm, no JVD, rub or murmur. Abdomen.  Soft, nontender, nondistended, BS positive. CNS.  Alert and oriented .  No focal neurologic deficit. Extremities.  No edema, no cyanosis, pulses intact and symmetrical. Psychiatry.  Appears to have some cognitive impairment  Condition at discharge: stable  The results of  significant diagnostics from this hospitalization (including imaging, microbiology, ancillary and laboratory) are listed below for reference.   Imaging Studies: ECHOCARDIOGRAM COMPLETE  Result Date: 06/23/2022    ECHOCARDIOGRAM REPORT   Patient Name:   Melissa [name] Date of Exam: 06/23/2022 Medical Rec #:  [id number]      Height:       65.0 in Accession #:    [id number]     Weight:       110.0 lb Date of Birth:  December 31, 1928       BSA:          1.534 m Patient Age:    89 years       BP:           148/58 mmHg Patient Gender: F              HR:           75 bpm. Exam Location:  [facility] Procedure: 2D Echo, Color Doppler and Cardiac Doppler Indications:     Stroke I63.9  History:         Patient has no prior history of Echocardiogram examinations.                  Murmur.  Sonographer:     L. Thornton-Maynard Referring Phys:  [id number] Brien Few NIU Diagnosing Phys: Debbe Odea MD IMPRESSIONS  1. Left ventricular ejection fraction, by estimation, is 55 to 60%. The left ventricle has normal function. The left ventricle has no regional wall motion abnormalities. Left ventricular diastolic parameters were normal.  2. Right ventricular systolic function is normal. The right ventricular size is normal. There is normal pulmonary artery systolic pressure.  3. The mitral valve is normal in structure. Mild to moderate mitral valve regurgitation.  4. The aortic valve is calcified. Aortic valve regurgitation is mild. Mild aortic valve stenosis. Aortic valve area, by VTI measures 1.49 cm. Aortic valve mean gradient measures 11.5 mmHg. Aortic valve Vmax measures 2.16 m/s.  5. The inferior vena cava is normal in size with greater than 50% respiratory variability, suggesting right atrial pressure of 3 mmHg. FINDINGS  Left Ventricle: Left ventricular ejection fraction, by estimation, is 55 to 60%. The left ventricle has normal function. The left ventricle has no regional wall motion abnormalities. The left ventricular internal cavity size  was normal in size. There is  no left ventricular hypertrophy. Left ventricular diastolic parameters were normal. Right Ventricle: The right ventricular size is normal. No increase in right ventricular wall thickness. Right ventricular systolic function is normal. There is normal pulmonary artery systolic pressure. The tricuspid regurgitant velocity is 2.79 m/s, and  with an assumed right  atrial pressure of 3 mmHg, the estimated right ventricular systolic pressure is 34.1 mmHg. Left Atrium: Left atrial size was normal in size. Right Atrium: Right atrial size was normal in size. Pericardium: There is no evidence of pericardial effusion. Mitral Valve: The mitral valve is normal in structure. Mild to moderate mitral valve regurgitation. MV peak gradient, 4.2 mmHg. The mean mitral valve gradient is 2.0 mmHg. Tricuspid Valve: The tricuspid valve is normal in structure. Tricuspid valve regurgitation is trivial. Aortic Valve: The aortic valve is calcified. Aortic valve regurgitation is mild. Aortic regurgitation PHT measures 531 msec. Mild aortic stenosis is present. Aortic valve mean gradient measures 11.5 mmHg. Aortic valve peak gradient measures 18.7 mmHg. Aortic valve area, by VTI measures 1.49 cm. Pulmonic Valve: The pulmonic valve was grossly normal. Pulmonic valve regurgitation is trivial. Aorta: The aortic root is normal in size and structure. Venous: The inferior vena cava is normal in size with greater than 50% respiratory variability, suggesting right atrial pressure of 3 mmHg. IAS/Shunts: No atrial level shunt detected by color flow Doppler.  LEFT VENTRICLE PLAX 2D LVIDd:         4.10 cm     Diastology LVIDs:         2.80 cm     LV e' medial:    5.55 cm/s LV PW:         1.30 cm     LV E/e' medial:  13.3 LV IVS:        1.20 cm     LV e' lateral:   7.40 cm/s LVOT diam:     2.10 cm     LV E/e' lateral: 10.0 LV SV:         69 LV SV Index:   45 LVOT Area:     3.46 cm  LV Volumes (MOD) LV vol d, MOD A2C: 45.0 ml LV  vol d, MOD A4C: 50.8 ml LV vol s, MOD A2C: 13.5 ml LV vol s, MOD A4C: 14.6 ml LV SV MOD A2C:     31.5 ml LV SV MOD A4C:     50.8 ml LV SV MOD BP:      33.4 ml RIGHT VENTRICLE             IVC RV Basal diam:  3.80 cm     IVC diam: 1.80 cm RV S prime:     21.10 cm/s TAPSE (M-mode): 3.4 cm LEFT ATRIUM             Index        RIGHT ATRIUM           Index LA diam:        3.70 cm 2.41 cm/m   RA Area:     13.80 cm LA Vol (A2C):   76.3 ml 49.73 ml/m  RA Volume:   31.70 ml  20.66 ml/m LA Vol (A4C):   43.9 ml 28.61 ml/m LA Biplane Vol: 61.2 ml 39.89 ml/m  AORTIC VALVE                     PULMONIC VALVE AV Area (Vmax):    1.73 cm      PV Vmax:          0.89 m/s AV Area (Vmean):   1.32 cm      PV Peak grad:     3.2 mmHg AV Area (VTI):     1.49 cm      PR End Diast Vel: 4.36  msec AV Vmax:           216.00 cm/s AV Vmean:          164.500 cm/s AV VTI:            0.462 m AV Peak Grad:      18.7 mmHg AV Mean Grad:      11.5 mmHg LVOT Vmax:         108.00 cm/s LVOT Vmean:        62.500 cm/s LVOT VTI:          0.199 m LVOT/AV VTI ratio: 0.43 AI PHT:            531 msec  AORTA Ao Root diam: 3.50 cm Ao Asc diam:  3.50 cm MITRAL VALVE                  TRICUSPID VALVE MV Area (PHT): 3.91 cm       TR Peak grad:   31.1 mmHg MV Area VTI:   3.13 cm       TR Vmax:        279.00 cm/s MV Peak grad:  4.2 mmHg MV Mean grad:  2.0 mmHg       SHUNTS MV Vmax:       1.02 m/s       Systemic VTI:  0.20 m MV Vmean:      69.0 cm/s      Systemic Diam: 2.10 cm MV Decel Time: 194 msec MR Peak grad:    173.7 mmHg MR Mean grad:    111.0 mmHg MR Vmax:         659.00 cm/s MR Vmean:        503.0 cm/s MR PISA:         0.57 cm MR PISA Eff ROA: 3 mm MR PISA Radius:  0.30 cm MV E velocity: 73.90 cm/s MV A velocity: 79.10 cm/s MV E/A ratio:  0.93 Debbe Odea MD Electronically signed by Debbe Odea MD Signature Date/Time: 06/23/2022/5:14:32 PM    Final    CT ANGIO HEAD NECK W WO CM  Result Date: 06/22/2022 CLINICAL DATA:  Stroke suspected EXAM:  CT ANGIOGRAPHY HEAD AND NECK WITH AND WITHOUT CONTRAST TECHNIQUE: Multidetector CT imaging of the head and neck was performed using the standard protocol during bolus administration of intravenous contrast. Multiplanar CT image reconstructions and MIPs were obtained to evaluate the vascular anatomy. Carotid stenosis measurements (when applicable) are obtained utilizing NASCET criteria, using the distal internal carotid diameter as the denominator. RADIATION DOSE REDUCTION: This exam was performed according to the departmental dose-optimization program which includes automated exposure control, adjustment of the mA and/or kV according to patient size and/or use of iterative reconstruction technique. CONTRAST:  75mL OMNIPAQUE IOHEXOL 350 MG/ML SOLN COMPARISON:  Same day CT head and MR head FINDINGS: CT HEAD FINDINGS See same day CT brain for intracranial findings. Infarcts seen on same day brain MRI are not visualized on this exam due to a combination of CT technique and post-contrast imaging. The presence of IV contrast also limits the ability to assess for intracranial blood products. CTA NECK FINDINGS Aortic arch: Two-vessel aortic arch. Imaged portion shows no evidence of aneurysm or dissection. No significant stenosis of the major arch vessel origins. There is a small amount of soft plaque in the proximal left subclavian artery. Right carotid system: No evidence of dissection, stenosis (50% or greater), or occlusion. Left carotid system: No evidence of dissection, stenosis (50% or greater),  or occlusion. Vertebral arteries: Codominant. No evidence of dissection, stenosis (50% or greater), or occlusion. The V4 segment of the left vertebral artery is small in caliber Skeleton: Negative. Other neck: Negative. Upper chest: Negative. Review of the MIP images confirms the above findings CTA HEAD FINDINGS Anterior circulation: There is moderate narrowing in the proximal supraclinoid ICA on the right. No aneurysm. No  large vessel occlusion Posterior circulation: No significant stenosis, proximal occlusion, aneurysm, or vascular malformation. Venous sinuses: As permitted by contrast timing, patent. Anatomic variants: None Review of the MIP images confirms the above findings IMPRESSION: 1. No large vessel occlusion. Moderate narrowing in the proximal supraclinoid right ICA. 2. No hemodynamically significant stenosis in the neck. 3. Infarcts seen on same day brain MRI are not visualized on this exam due to a combination of CT technique and post-contrast imaging. The presence of IV contrast also limits the ability to assess for intracranial blood products. Electronically Signed   By: Lorenza Cambridge M.D.   On: 06/22/2022 17:58   MR BRAIN WO CONTRAST  Result Date: 06/22/2022 CLINICAL DATA:  Headache, neuro deficit, suspected stroke. Left-sided weakness. EXAM: MRI HEAD WITHOUT CONTRAST TECHNIQUE: Multiplanar, multiecho pulse sequences of the brain and surrounding structures were obtained without intravenous contrast. COMPARISON:  MRI brain 07/03/2018.  Head CT 06/22/2022. FINDINGS: Brain: Acute infarcts in the right corona radiata and inferior right parietal lobe (image 31 series 5). No acute hemorrhage or significant mass effect. Old infarct in the right occipital lobe. Old lacunar infarcts in the right globus pallidus and bilateral cerebellar hemispheres. Underlying moderate chronic small-vessel disease. No hydrocephalus or extra-axial collection. Vascular: Normal flow voids. Skull and upper cervical spine: Normal marrow signal. Sinuses/Orbits: Unremarkable. Other: None. IMPRESSION: Acute infarcts in the right corona radiata and inferior right parietal lobe. No acute hemorrhage or significant mass effect. Electronically Signed   By: Orvan Falconer M.D.   On: 06/22/2022 16:22   CT HEAD WO CONTRAST ( )  Result Date: 06/22/2022 CLINICAL DATA:  New onset headache. EXAM: CT HEAD WITHOUT CONTRAST TECHNIQUE: Contiguous axial images  were obtained from the base of the skull through the vertex without intravenous contrast. RADIATION DOSE REDUCTION: This exam was performed according to the departmental dose-optimization program which includes automated exposure control, adjustment of the mA and/or kV according to patient size and/or use of iterative reconstruction technique. COMPARISON:  06/28/2021. FINDINGS: Brain: No evidence of acute infarction, hemorrhage, hydrocephalus, extra-axial collection or mass lesion/mass effect. Old right occipital and superior cerebellar infarcts. Vascular: No hyperdense vessel or unexpected calcification. Skull: Normal. Negative for fracture or focal lesion. Sinuses/Orbits: Globes and orbits are unremarkable. Sinuses are clear. Other: None. IMPRESSION: 1. No acute intracranial abnormalities. No change from the previous head CT. Electronically Signed   By: Amie Portland M.D.   On: 06/22/2022 14:43    Microbiology: Results for orders placed or performed during the hospital encounter of 06/22/22  Urine Culture     Status: Abnormal   Collection Time: 06/22/22  1:47 PM   Specimen: Urine, Clean Catch  Result Value Ref Range Status   Specimen Description   Final    URINE, CLEAN CATCH Performed at [facility], [address], [place], [place] [address]    Special Requests   Final    NONE Performed at [facility], [address] [place], [place] [address]    Culture >=100,000 COLONIES/mL ESCHERICHIA COLI (A)  Final   Report Status 06/24/2022 FINAL  Final   Organism ID, Bacteria ESCHERICHIA  COLI (A)  Final      Susceptibility   Escherichia coli - MIC*    AMPICILLIN >=32 RESISTANT Resistant     CEFAZOLIN <=4 SENSITIVE Sensitive     CEFEPIME <=0.12 SENSITIVE Sensitive     CEFTRIAXONE <=0.25 SENSITIVE Sensitive     CIPROFLOXACIN <=0.25 SENSITIVE Sensitive     GENTAMICIN <=1 SENSITIVE Sensitive     IMIPENEM <=0.25 SENSITIVE Sensitive     NITROFURANTOIN <=16 SENSITIVE Sensitive      TRIMETH/SULFA <=20 SENSITIVE Sensitive     AMPICILLIN/SULBACTAM 4 SENSITIVE Sensitive     PIP/TAZO <=4 SENSITIVE Sensitive     * >=100,000 COLONIES/mL ESCHERICHIA COLI    Labs: CBC: Recent Labs  Lab 06/22/22 1347  WBC 9.2  NEUTROABS 5.8  HGB 10.0*  HCT 30.9*  MCV 101.0*  PLT 399   Basic Metabolic Panel: Recent Labs  Lab 06/22/22 1347  NA 141  K 4.0  CL 107  CO2 26  GLUCOSE 144*  BUN 23  CREATININE 0.89  CALCIUM 9.0   Liver Function Tests: Recent Labs  Lab 06/22/22 1347  AST 27  ALT 16  ALKPHOS 88  BILITOT 0.7  PROT 7.0  ALBUMIN 3.3*   CBG: No results for input(s): "GLUCAP" in the last 168 hours.  Discharge time spent: greater than 30 minutes.  This record has been created using Conservation officer, historic buildings. Errors have been sought and corrected,but may not always be located. Such creation errors do not reflect on the standard of care.   Signed: Arnetha Courser, MD [facility] 06/25/2022

## 2022-06-25 NOTE — Progress Notes (Signed)
RN attempted to call [facility] 4 times. Transferred call by them to nurse, nobody answer . Left my ph no with them so nurse can give me a call.

## 2022-06-25 NOTE — Progress Notes (Signed)
*  PRELIMINARY RESULTS* Echocardiogram 2D Echocardiogram has been performed.  Cristela Blue 06/25/2022, 9:15 AM

## 2022-06-25 NOTE — Plan of Care (Signed)

## 2022-06-25 NOTE — TOC Transition Note (Signed)
Transition of Care [facility]) - CM/SW Discharge Note   Patient Details  Name: Melissa [name] MRN: [id number] Date of Birth: 08-25-1928  [facility] CM/SW Contact:  Garret Reddish, RN Phone Number: 06/25/2022, 1:06 PM   Clinical Narrative:   Chart reviewed.  Provider reports that patient will be a discharge for today.  I have spoken with Sue Lush with [facility] and she reports that patient will be able to return to the facility today.  Patient will receive Short term rehab and then return to long term care.   Sue Lush reports that patient will be going to room 515 and the number to call report is [phone].    I have arranged [facility] to transport patient back to facility today.  I have left a message for Mr. Sciulli that patient will be transferring back to facility today.  I have sent Sue Lush, Admission coordinator with [facility] patient's Discharge Summary, SNF Transfer Report, and Discharge orders.    I have made staff nurse aware of the above information.      Final next level of care: Skilled Nursing Facility Barriers to Discharge: No Barriers Identified   Patient Goals and CMS Choice      Discharge Placement                Patient chooses bed at: [facility] Patient to be transferred to facility by: [facility] Name of family member notified: Tangie Crawford Patient and family notified of of transfer: 06/25/22  Discharge Plan and Services Additional resources added to the After Visit Summary for                                       Social Determinants of Health (SDOH) Interventions SDOH Screenings   Food Insecurity: No Food Insecurity (06/23/2022)  Housing: Low Risk  (06/23/2022)  Transportation Needs: No Transportation Needs (06/23/2022)  Utilities: Not At Risk (06/23/2022)  Tobacco Use: Low Risk  (06/23/2022)     Readmission Risk Interventions     No data to display

## 2022-06-25 NOTE — Progress Notes (Signed)
Physical Therapy Treatment Patient Details Name: Melissa [name] MRN: [id number] DOB: [birth year] Today's Date: 06/25/2022   History of Present Illness Pt is a 89 yo female that presented from ALF due to L sided weakness. Workup showed  small acute ischemic infarctions in the right parietal lobe and corona radiata. U/A was also consistent with a UTI. PMH of GERD, heart murmuer, arthritis, kyphoplasty, R reverse TSA and per chart wheelchair bound at baseline.    PT Comments    PT seen this am with OT in order to provide and assess safe edge of bed tolerance and mobility. Pt required MaxA of 2 to transfer to/from EOB. Verbal and tactile cues to maintain midline sitting balance with varied assist between Mod/MinA to prevent L lateral lean and forward flexion initially for several minutes. Pt with limited use of L UE to support trunk due to discomfort from IV access site. Overall good tolerance for activity and engaged throughout session. Limited trunk strength due to debilitation/mobility, as well as baseline positioning at [facility] in a Tilt-in-Space w/c for comfort and pressure relief. Will continue PT per POC. No c/o dizziness/pain during session.   Recommendations for follow up therapy are one component of a multi-disciplinary discharge planning process, led by the attending physician.  Recommendations may be updated based on patient status, additional functional criteria and insurance authorization.  Follow Up Recommendations  Can patient physically be transported by private vehicle: No    Assistance Recommended at Discharge Frequent or constant Supervision/Assistance  Patient can return home with the following A lot of help with bathing/dressing/bathroom;Assistance with cooking/housework;Direct supervision/assist for medications management;Assist for transportation;Help with stairs or ramp for entrance;A lot of help with walking and/or transfers   Equipment Recommendations  None recommended by  PT    Recommendations for Other Services       Precautions / Restrictions Precautions Precautions: Fall Restrictions Weight Bearing Restrictions: No     Mobility  Bed Mobility Overal bed mobility: Needs Assistance Bed Mobility: Supine to Sit     Supine to sit: Max assist, HOB elevated, +2 for physical assistance Sit to supine: Max assist, +2 for physical assistance   General bed mobility comments:  (Utilized physical assist of +2 to ensure safe transition to/from EOB due to pt's poor trunk control and multiple comorbidities)    Transfers                   General transfer comment: Pt unable to tolerated transfer OOB, at baseline she is a MaxA squat pivot to personal Tilt in Space w/c    Ambulation/Gait               General Gait Details:  (Has not been ambulatatory ~1 year)   Social research officer, [employer] (Stroke Patients Only)       Balance Overall balance assessment: Needs assistance Sitting-balance support: Feet supported Sitting balance-Leahy Scale: Poor Sitting balance - Comments:  (Repeated verbal and tactile cues to regain midline posture.) Postural control: Left lateral lean (Flexed forward posture)   Standing balance-Leahy Scale: Zero                              Cognition Arousal/Alertness: Awake/alert Behavior During Therapy: WFL for tasks assessed/performed Overall Cognitive Status: History of cognitive impairments - at baseline  General Comments:  (Lethargic, but able to engage in conversation with therapists and husband at bedside)        Exercises      General Comments General comments (skin integrity, edema, etc.): Overall intact with Bilateral heel pressure prevention relief bandages      Pertinent Vitals/Pain Pain Assessment Pain Assessment: No/denies pain    Home Living                          Prior Function             PT Goals (current goals can now be found in the care plan section) Acute Rehab PT Goals Patient Stated Goal:  (Get back to [employer])    Frequency    Min 2X/week      PT Plan      Co-evaluation PT/OT/SLP Co-Evaluation/Treatment: Yes Reason for Co-Treatment: Complexity of the patient's impairments (multi-system involvement);For patient/therapist safety PT goals addressed during session: Mobility/safety with mobility;Balance        AM-PAC PT "6 Clicks" Mobility   Outcome Measure  Help needed turning from your back to your side while in a flat bed without using bedrails?: A Lot Help needed moving from lying on your back to sitting on the side of a flat bed without using bedrails?: A Lot Help needed moving to and from a bed to a chair (including a wheelchair)?: A Lot Help needed standing up from a chair using your arms (e.g., wheelchair or bedside chair)?: A Lot Help needed to walk in hospital room?: Total Help needed climbing 3-5 steps with a railing? : Total 6 Click Score: 10    End of Session   Activity Tolerance: Patient tolerated treatment well Patient left: in bed;with call bell/phone within reach;with bed alarm set;with family/visitor present Nurse Communication: Mobility status PT Visit Diagnosis: Other abnormalities of gait and mobility (R26.89);Difficulty in walking, not elsewhere classified (R26.2)     Time: 1610-9604 PT Time Calculation (min) (ACUTE ONLY): 23 min  Charges:  $Therapeutic Activity: 8-22 mins                    Zadie Cleverly, PTA  Jannet Askew 06/25/2022, 12:07 PM

## 2022-06-25 NOTE — Progress Notes (Signed)
Occupational Therapy Treatment Patient Details Name: Melissa [name] MRN: [id number] DOB: 1928-10-14 Today's Date: 06/25/2022   History of present illness Pt is a 89 yo female that presented from ALF due to L sided weakness. Workup showed  small acute ischemic infarctions in the right parietal lobe and corona radiata. U/A was also consistent with a UTI. PMH of GERD, heart murmuer, arthritis, kyphoplasty, R reverse TSA and per chart wheelchair bound at baseline.   OT comments  Upon entering the room, pt supine in bed with husband present in room. Pt's husband encourages pt to participate. She appears quite fatigued but agreeable and pt seen for skilled co-treatment with PTA. Pt needing max A for 2 to EOB. Pt with min A for static sitting balance for 10 minutes on EOB. Pt with significant forward shoulders and head in seated position. Attempts to position pt in R leaning onto elbow with assistance but unable to reposition back to midline and otherwise with L Lateral lean. Pt fatigued and returns to supine with max A of 2 and repositioned for comfort.    Recommendations for follow up therapy are one component of a multi-disciplinary discharge planning process, led by the attending physician.  Recommendations may be updated based on patient status, additional functional criteria and insurance authorization.       Patient can return home with the following  A lot of help with walking and/or transfers;A lot of help with bathing/dressing/bathroom;Direct supervision/assist for medications management;Assistance with feeding   Equipment Recommendations  None recommended by OT       Precautions / Restrictions Precautions Precautions: Fall Restrictions Weight Bearing Restrictions: No       Mobility Bed Mobility Overal bed mobility: Needs Assistance Bed Mobility: Supine to Sit     Supine to sit: Max assist, HOB elevated, +2 for physical assistance Sit to supine: Max assist, +2 for physical  assistance        Transfers Overall transfer level: Needs assistance                 General transfer comment: Pt unable to tolerated transfer OOB, at baseline she is a MaxA squat pivot to personal Tilt in Space w/c     Balance Overall balance assessment: Needs assistance Sitting-balance support: Feet supported Sitting balance-Leahy Scale: Poor   Postural control: Left lateral lean                                 ADL either performed or assessed with clinical judgement     Vision Patient Visual Report: No change from baseline            Cognition Arousal/Alertness: Awake/alert Behavior During Therapy: WFL for tasks assessed/performed Overall Cognitive Status: History of cognitive impairments - at baseline                                                General Comments Overall intact with Bilateral heel pressure prevention relief bandages    Pertinent Vitals/ Pain       Pain Assessment Pain Assessment: Faces Faces Pain Scale: No hurt         Frequency  Min 1X/week        Progress Toward Goals  OT Goals(current goals can now be found in the care plan section)  Progress towards OT goals: Progressing toward goals     Plan Discharge plan remains appropriate;Frequency remains appropriate    Co-evaluation    PT/OT/SLP Co-Evaluation/Treatment: Yes Reason for Co-Treatment: Complexity of the patient's impairments (multi-system involvement);For patient/therapist safety PT goals addressed during session: Mobility/safety with mobility;Balance OT goals addressed during session: ADL's and self-care      AM-PAC OT "6 Clicks" Daily Activity     Outcome Measure   Help from another person eating meals?: A Little Help from another person taking care of personal grooming?: A Lot Help from another person toileting, which includes using toliet, bedpan, or urinal?: Total Help from another person bathing (including washing,  rinsing, drying)?: Total Help from another person to put on and taking off regular upper body clothing?: A Lot Help from another person to put on and taking off regular lower body clothing?: Total 6 Click Score: 10    End of Session    OT Visit Diagnosis: Unsteadiness on feet (R26.81);Muscle weakness (generalized) (M62.81);History of falling (Z91.81)   Activity Tolerance Patient tolerated treatment well;Patient limited by fatigue   Patient Left in bed;with bed alarm set;with family/visitor present;with call bell/phone within reach   Nurse Communication Mobility status        Time: 1043-1106 OT Time Calculation (min): 23 min  Charges: OT General Charges $OT Visit: 1 Visit OT Treatments $Therapeutic Activity: 8-22 mins  Jackquline Denmark, MS, OTR/L , CBIS ascom [phone]  06/25/22, 12:38 PM

## 2022-06-27 ENCOUNTER — Non-Acute Institutional Stay (SKILLED_NURSING_FACILITY): Payer: Medicare Other | Admitting: Student

## 2022-06-27 ENCOUNTER — Encounter: Payer: Self-pay | Admitting: Student

## 2022-06-27 DIAGNOSIS — M8448XG Pathological fracture, other site, subsequent encounter for fracture with delayed healing: Secondary | ICD-10-CM | POA: Diagnosis not present

## 2022-06-27 DIAGNOSIS — I69354 Hemiplegia and hemiparesis following cerebral infarction affecting left non-dominant side: Secondary | ICD-10-CM

## 2022-06-27 DIAGNOSIS — M4857XA Collapsed vertebra, not elsewhere classified, lumbosacral region, initial encounter for fracture: Secondary | ICD-10-CM

## 2022-06-27 DIAGNOSIS — F32A Depression, unspecified: Secondary | ICD-10-CM | POA: Diagnosis not present

## 2022-06-27 DIAGNOSIS — S72141A Displaced intertrochanteric fracture of right femur, initial encounter for closed fracture: Secondary | ICD-10-CM

## 2022-06-27 DIAGNOSIS — S32010A Wedge compression fracture of first lumbar vertebra, initial encounter for closed fracture: Secondary | ICD-10-CM

## 2022-06-27 DIAGNOSIS — E43 Unspecified severe protein-calorie malnutrition: Secondary | ICD-10-CM

## 2022-06-27 NOTE — Progress Notes (Addendum)
Provider:  Dr. Earnestine Mealing Location:  Other [place].  Nursing Home Room Number: [place] 515A Place of Service:  SNF (31)  PCP: Earnestine Mealing, MD Patient Care Team: Earnestine Mealing, MD as PCP - General [facility])  Extended Emergency Contact Information Primary Emergency Contact: Wence,[name] Address: [address]          [place], [place] [address] D[place] of [place] Home Phone: [phone] Relation: Spouse Secondary Emergency Contact: Cassaro,[name] Mobile Phone: [phone] Relation: Son  Code Status: DNR Goals of Care: Advanced Directive information    06/27/2022   10:54 AM  Advanced Directives  Does Patient Have a Medical Advance Directive? Yes  Type of Advance Directive Out of facility DNR (pink MOST or yellow form)  Does patient want to make changes to medical advance directive? No - Patient declined      Chief Complaint  Patient presents with   New Admit To SNF    Admission.     HPI: Patient is a 89 y.o. female seen today for readmission to [facility]. Patient is here for long term care in the setting of increased support necessary for ADLS and progressive memory changes.   Patient was evaluated in the ED for left sided weakness. Found to have acute infarcts in corona radiata and inferior right parietal lobe. No acute hemorrhage present. Patietn was started on ASA and lipitor. No history of elevated BP. Avoiding plavix due to current health status. Patient was also found to have a UTI and was treated with CTX followed by keflex-- 3d left. Home medications for depression and iron continued. Echocardiogram prior to discharge showed mildly dilated atrial sizze. Normal EF. Normal pressures. Mild AVR and stenosis. Bubble study negative.   Past Medical History:  Diagnosis Date   Arthritis    Chest pain    a. 05/2015 - post-prandial.   GERD (gastroesophageal reflux disease)    a. takes Omeprazole daily, prev seen by D. Brodie.   Heart murmur    a.  05/2013 Echo: EF 55-60%, gr2 DD, mild AI/MR.   History of blood transfusion    no abnormal reaction noted    History of migraine headaches    a. last 10/2013   Iron deficiency anemia    Joint pain    PONV (postoperative nausea and vomiting)    Rectocele 2011   Past Surgical History:  Procedure Laterality Date   cataract surgery  Bilateral    R 03/2001; L 05/2001.   COLONOSCOPY     HYSTEROSCOPY  11/1999   w/ resection of endometrial polyp by uterine curetting.   INTRAMEDULLARY (IM) NAIL INTERTROCHANTERIC Right 07/15/2020   Procedure: INTRAMEDULLARY (IM) NAIL INTERTROCHANTRIC;  Surgeon: Juanell Fairly, MD;  Location: [facility] ORS;  Service: Orthopedics;  Laterality: Right;   JOINT REPLACEMENT Bilateral R-9/04, L- 8/05   Total Knee Arthroplasties.   KYPHOPLASTY N/A 04/10/2019   Procedure: KYPHOPLASTY;  Surgeon: Kennedy Bucker, MD;  Location: [facility] ORS;  Service: Orthopedics;  Laterality: N/A;   REVERSE SHOULDER ARTHROPLASTY Right 11/26/2013   Procedure: RIGHT REVERSE SHOULDER ARTHROPLASTY;  Surgeon: Senaida Lange, MD;  Location: [facility] OR;  Service: Orthopedics;  Laterality: Right;   SHOULDER ARTHROSCOPY Right    THYROIDECTOMY  at age 75   TONSILLECTOMY      reports that she has never smoked. She has never used smokeless tobacco. She reports that she does not drink alcohol and does not use drugs. Social History   Socioeconomic History   Marital status: Married  Spouse name: Not on file   Number of children: Not on file   Years of education: Not on file   Highest education level: Not on file  Occupational History   Not on file  Tobacco Use   Smoking status: Never   Smokeless tobacco: Never  Vaping Use   Vaping Use: Never used  Substance and Sexual Activity   Alcohol use: No   Drug use: No   Sexual activity: Never    Birth control/protection: Post-menopausal  Other Topics Concern   Not on file  Social History Narrative   Retired.  Very active.  Lives at [facility] with 21 y/o  husband.  Walks one mile daily (~ 40 mins) and partakes in H2O aerobics twice/wk.  Enjoys gardening.   Social Determinants of Health   Financial Resource Strain: Not on file  Food Insecurity: No Food Insecurity (06/23/2022)   Hunger Vital Sign    Worried About Running Out of Food in the Last Year: Never true    Ran Out of Food in the Last Year: Never true  Transportation Needs: No Transportation Needs (06/23/2022)   PRAPARE - Administrator, Civil Service (Medical): No    Lack of Transportation (Non-Medical): No  Physical Activity: Not on file  Stress: Not on file  Social Connections: Not on file  Intimate Partner Violence: Not At Risk (06/23/2022)   Humiliation, Afraid, Rape, and Kick questionnaire    Fear of Current or Ex-Partner: No    Emotionally Abused: No    Physically Abused: No    Sexually Abused: No    Functional Status Survey:    Family History  Problem Relation Age of Onset   Heart attack Mother        died @ 70   Heart attack Brother        died @ 33   Heart attack Brother        died @ 3   Other Father        died suddenly @ 18.    Health Maintenance  Topic Date Due   DEXA SCAN  Never done   Medicare Annual Wellness (AWV)  06/14/2021   DTaP/Tdap/Td (2 - Td or Tdap) 11/12/2021   COVID-19 Vaccine (7 - 2023-24 season) 02/23/2022   INFLUENZA VACCINE  09/20/2022   Pneumonia Vaccine 32+ Years old  Completed   Zoster Vaccines- Shingrix  Completed   HPV VACCINES  Aged Out    Allergies  Allergen Reactions   Sulfonamide Derivatives Hives   Sulfa Antibiotics Other (See Comments) and Rash    Outpatient Encounter Medications as of 06/27/2022  Medication Sig   acetaminophen (TYLENOL) 500 MG tablet Take 1,000 mg by mouth 3 (three) times daily.   aspirin 325 MG tablet Take 1 tablet (325 mg total) by mouth daily.   atorvastatin (LIPITOR) 40 MG tablet Take 1 tablet (40 mg total) by mouth daily.   bismuth subsalicylate (PEPTO BISMOL) 262 MG/15ML suspension  Take 30 mLs by mouth every 4 (four) hours as needed.   calcium carbonate (OSCAL) 1500 (600 Ca) MG TABS tablet Take 600 mg of elemental calcium by mouth daily.   cholecalciferol (VITAMIN D) 1000 units tablet Take 1,000 Units by mouth daily.   ferrous sulfate 325 (65 FE) MG tablet Take 1 tablet (325 mg total) by mouth every other day.   mirabegron ER (MYRBETRIQ) 25 MG TB24 tablet Take 25 mg by mouth daily.   mirtazapine (REMERON) 7.5 MG tablet Take 7.5  mg by mouth at bedtime.   polyethylene glycol (MIRALAX / GLYCOLAX) 17 g packet Take 17 g by mouth every other day.   saccharomyces boulardii (FLORASTOR) 250 MG capsule Take 250 mg by mouth daily.   UNABLE TO FIND Take 4 oz by mouth every evening. Med Name: Medpass   [DISCONTINUED] aspirin (ASPIRIN CHILDRENS) 81 MG chewable tablet Chew 1 tablet (81 mg total) by mouth daily.   [DISCONTINUED] cephALEXin (KEFLEX) 500 MG capsule Take 1 capsule (500 mg total) by mouth every 8 (eight) hours for 3 days.   No facility-administered encounter medications on file as of 06/27/2022.    Review of Systems  Vitals:   06/27/22 1047  BP: 120/68  Pulse: 73  Resp: 18  Temp: 97.9 F (36.6 C)  SpO2: 98%  Weight: 115 lb (52.2 kg)  Height: 5\' 5"  (1.651 m)   Body mass index is 19.14 kg/m. Physical Exam Cardiovascular:     Rate and Rhythm: Normal rate.  Pulmonary:     Effort: Pulmonary effort is normal.     Breath sounds: Normal breath sounds.  Abdominal:     General: Abdomen is flat.     Palpations: Abdomen is soft.  Neurological:     Mental Status: She is alert. Mental status is at baseline. She is disoriented.     Comments: Patient speaking nonsensical      Labs reviewed: Basic Metabolic Panel: Recent Labs    06/27/21 2350 12/18/21 0000 03/22/22 0000 06/22/22 1347  NA 139 140 142 141  K 4.3 4.1 4.2 4.0  CL 107 105 106 107  CO2 28 24* 28* 26  GLUCOSE 102*  --   --  144*  BUN 24* 24* 20 23  CREATININE 0.84 0.7 0.8 0.89  CALCIUM 8.4* 8.6*  9.1 9.0   Liver Function Tests: Recent Labs    12/18/21 0000 06/22/22 1347  AST 21 27  ALT 17 16  ALKPHOS 97 88  BILITOT  --  0.7  PROT  --  7.0  ALBUMIN 3.7 3.3*   No results for input(s): "LIPASE", "AMYLASE" in the last 8760 hours. No results for input(s): "AMMONIA" in the last 8760 hours. CBC: Recent Labs    06/27/21 2350 12/18/21 0000 03/22/22 0000 06/22/22 1347  WBC 11.4* 7.4 9.5 9.2  NEUTROABS 8.2* 5,017.00 6,897.00 5.8  HGB 8.6* 10.0* 11.0* 10.0*  HCT 26.8* 29* 33* 30.9*  MCV 99.3  --   --  101.0*  PLT 460* 331 348 399   Cardiac Enzymes: No results for input(s): "CKTOTAL", "CKMB", "CKMBINDEX", "TROPONINI" in the last 8760 hours. BNP: Invalid input(s): "POCBNP" Lab Results  Component Value Date   HGBA1C 5.2 06/22/2022   No results found for: "TSH" Lab Results  Component Value Date   VITAMINB12 811 09/03/2008   Lab Results  Component Value Date   FOLATE >20.0 09/03/2008   Lab Results  Component Value Date   IRON 71 05/03/2009   TIBC 249 (L) 05/03/2009   FERRITIN 279 05/03/2009    Imaging and Procedures obtained prior to SNF admission: ECHOCARDIOGRAM COMPLETE BUBBLE STUDY  Result Date: 06/25/2022    ECHOCARDIOGRAM REPORT   Patient Name:   JACALYN SOULSBY Date of Exam: 06/24/2022 Medical Rec #:  [id number]      Height:       65.0 in Accession #:    [id number]     Weight:       110.0 lb Date of Birth:  12/03/1928  BSA:          1.534 m Patient Age:    89 years       BP:           154/69 mmHg Patient Gender: F              HR:           72 bpm. Exam Location:  [place] Procedure: 2D Echo, Cardiac Doppler, Color Doppler and Saline Contrast Bubble            Study Indications:     Stroke 434.91 / I63.9  History:         Patient has prior history of Echocardiogram examinations, most                  recent 06/23/2022. Signs/Symptoms:Chest Pain. Migraines.  Sonographer:     Cristela Blue Referring Phys:  [id number] ERIC LINDZEN Diagnosing Phys: Lorine Bears MD  Sonographer  Comments: Technically challenging study due to limited acoustic windows, no apical window and no subcostal window. IMPRESSIONS  1. Left ventricular ejection fraction, by estimation, is 60 to 65%. The left ventricle has normal function. The left ventricle has no regional wall motion abnormalities. Left ventricular diastolic function could not be evaluated.  2. Right ventricular systolic function is normal. The right ventricular size is normal. Tricuspid regurgitation signal is inadequate for assessing PA pressure.  3. Left atrial size was mildly dilated.  4. The mitral valve is normal in structure. Mild mitral valve regurgitation. No evidence of mitral stenosis.  5. The aortic valve is normal in structure. Aortic valve regurgitation is mild. Mild aortic valve stenosis.  6. Agitated saline contrast bubble study was negative, with no evidence of any interatrial shunt.  7. Technically challenging study due to limited acoustic windows, no apical window and no subcostal window.   FINDINGS  Left Ventricle: Left ventricular ejection fraction, by estimation, is 60 to 65%. The left ventricle has normal function. The left ventricle has no regional wall motion abnormalities. The left ventricular internal cavity size was normal in size. There is  no left ventricular hypertrophy. Left ventricular diastolic function could not be evaluated. Right Ventricle: The right ventricular size is normal. No increase in right ventricular wall thickness. Right ventricular systolic function is normal. Tricuspid regurgitation signal is inadequate for assessing PA pressure. Left Atrium: Left atrial size was mildly dilated. Right Atrium: Right atrial size was normal in size. Pericardium: There is no evidence of pericardial effusion. Mitral Valve: The mitral valve is normal in structure. Mild mitral valve regurgitation. No evidence of mitral valve stenosis. Tricuspid Valve: The tricuspid valve is normal in structure. Tricuspid valve regurgitation  is not demonstrated. No evidence of tricuspid stenosis. Aortic Valve: The aortic valve is normal in structure. Aortic valve regurgitation is mild. Mild aortic stenosis is present. Pulmonic Valve: The pulmonic valve was normal in structure. Pulmonic valve regurgitation is mild. No evidence of pulmonic stenosis. Aorta: The aortic root is normal in size and structure. Venous: The inferior vena cava was not well visualized. IAS/Shunts: No atrial level shunt detected by color flow Doppler. Agitated saline contrast was given intravenously to evaluate for intracardiac shunting. Agitated saline contrast bubble study was negative, with no evidence of any interatrial shunt.  LEFT VENTRICLE PLAX 2D LVIDd:         4.20 cm LVIDs:         2.80 cm LV PW:         1.10 cm LV  IVS:        1.00 cm LVOT diam:     2.00 cm LVOT Area:     3.14 cm  LEFT ATRIUM         Index LA diam:    3.80 cm 2.48 cm/m   AORTA Ao Root diam: 3.10 cm  SHUNTS Systemic Diam: 2.00 cm Lorine Bears MD Electronically signed by Lorine Bears MD Signature Date/Time: 06/25/2022/4:59:52 PM    Final     Assessment/Plan 1. Hemiparesis affecting left side as late effect of stroke [facility]) Patient with new onset stroke. Unclear cause given normal blood pressures, normal echocardiogram. Goal directed medications initiated with ASA and lipitor, however, given numerous falls, malnutrition, age, and current prognosis, initiation of plavix deferred. Evaluation with speech therapy recommended dysphagia diet and nectar thick liquid. Will have additional evaluation at this time given patient had significant delirium during hospitalization which could have impacted results. PT OT to continue.    2. Compression fracture of lumbosacral vertebra, nontraumatic, initial encounter with retropulsion 04/09/2019 3. Sacral insufficiency fracture with delayed healing, subsequent encounter2/18/2021 5. Closed intertrochanteric fracture of hip, right, initial encounter (HCC)  07/14/2020 History of numerous fractures previously due to falls. No recent falls. Increased risk of injurious falls and difficult rehabilitation with new stroke.   6. Protein-calorie malnutrition, severe (HCC) Patient with low BMI. Dietary changes could also contribute. Continue GOC conversation regarding liberalizing diet to maintain nutrition. Protein supplementation as tolerated. Continue mirtazapine.  7. Depression, unspecified depression type Mood stable, continue mirtazapine    Family/ staff Communication: nursing, need to make contact with spouse for continued GOC.   Labs/tests ordered: BMP and CBC 1 week

## 2022-07-02 LAB — CBC AND DIFFERENTIAL
HCT: 29 — AB (ref 36–46)
Hemoglobin: 9.4 — AB (ref 12.0–16.0)
Platelets: 380 10*3/uL (ref 150–400)
WBC: 9.9

## 2022-07-02 LAB — BASIC METABOLIC PANEL
BUN: 26 — AB (ref 4–21)
CO2: 28 — AB (ref 13–22)
Chloride: 108 (ref 99–108)
Creatinine: 0.6 (ref 0.5–1.1)
Glucose: 64
Sodium: 144 (ref 137–147)

## 2022-07-02 LAB — COMPREHENSIVE METABOLIC PANEL
Calcium: 8.4 — AB (ref 8.7–10.7)
eGFR: 84

## 2022-07-02 LAB — CBC: RBC: 2.91 — AB (ref 3.87–5.11)

## 2022-07-05 DIAGNOSIS — Z8673 Personal history of transient ischemic attack (TIA), and cerebral infarction without residual deficits: Secondary | ICD-10-CM | POA: Diagnosis not present

## 2022-07-05 DIAGNOSIS — R531 Weakness: Secondary | ICD-10-CM | POA: Diagnosis not present

## 2022-07-06 ENCOUNTER — Telehealth (INDEPENDENT_AMBULATORY_CARE_PROVIDER_SITE_OTHER): Payer: Medicare Other | Admitting: Student

## 2022-07-06 DIAGNOSIS — E43 Unspecified severe protein-calorie malnutrition: Secondary | ICD-10-CM | POA: Diagnosis not present

## 2022-07-06 DIAGNOSIS — Z66 Do not resuscitate: Secondary | ICD-10-CM | POA: Diagnosis not present

## 2022-07-06 DIAGNOSIS — I69354 Hemiplegia and hemiparesis following cerebral infarction affecting left non-dominant side: Secondary | ICD-10-CM

## 2022-07-06 NOTE — Telephone Encounter (Signed)
Discussed with patient's husband her most recent  recent hospitalization. This was a face to face conversation with patient's husband in the [facility]   - stroke with left-sided weakness  - dehydration - UTI - Malnourished with low albumin and BMI   Discussed concern that she is declining in every way -- memory, maintaining nutrition and hydration and this leads to a poor prognosis and outcomes. He does not want cardiology consultation. No Pace maker or interventions. She had hydrocephalous back in 2022 and she would have more urination, confused, and couldn't walk well anymore. This was contributory to her fall at that time. He doesn't anticipate going back to specialists in the future. He does, however, want her to return to continue returning to the hospital in the future if things are not well. He confirms that she has DNR.  He would like me to continue discussions with his son who will be her on 5/25.   August 20 will be their 74th anniversary which is a goal for her to be well at that time.   All questions answered. I spent >20 minutes discussing patient's current status, goals of care and prognosis.

## 2022-07-09 ENCOUNTER — Non-Acute Institutional Stay (SKILLED_NURSING_FACILITY): Payer: Medicare Other | Admitting: Student

## 2022-07-09 ENCOUNTER — Encounter: Payer: Self-pay | Admitting: Student

## 2022-07-09 DIAGNOSIS — R233 Spontaneous ecchymoses: Secondary | ICD-10-CM | POA: Diagnosis not present

## 2022-07-09 NOTE — Progress Notes (Unsigned)
Location:  Other [facility] [place].  Nursing Home Room Number: [facility] [id number] Place of Service:  SNF [phone]) Provider:  Earnestine Mealing, MD  Patient Care Team: Earnestine Mealing, MD as PCP - General [facility])  Extended Emergency Contact Information Primary Emergency Contact: Katzenberger,[name] Address: [address]          [place], [place] [address] [place] of [place] Home Phone: [phone] Relation: Spouse Secondary Emergency Contact: Knab,[name] Mobile Phone: [phone] Relation: Son  Code Status:  DNR Goals of care: Advanced Directive information    06/27/2022   10:54 AM  Advanced Directives  Does Patient Have a Medical Advance Directive? Yes  Type of Advance Directive Out of facility DNR (pink MOST or yellow form)  Does patient want to make changes to medical advance directive? No - Patient declined     Chief Complaint  Patient presents with   Acute Visit    Rash    HPI:  Pt is a 89 y.o. female seen today for an acute visit for Rash  Nursing state patient had a rash starting this morning.   Patient denies pain, itching, discomfort.   Vital signs have been stable.   Of note, patient recently started Aspirin.    Past Medical History:  Diagnosis Date   Arthritis    Chest pain    a. 05/2015 - post-prandial.   GERD (gastroesophageal reflux disease)    a. takes Omeprazole daily, prev seen by D. Brodie.   Heart murmur    a. 05/2013 Echo: EF 55-60%, gr2 DD, mild AI/MR.   History of blood transfusion    no abnormal reaction noted    History of migraine headaches    a. last 10/2013   Iron deficiency anemia    Joint pain    PONV (postoperative nausea and vomiting)    Rectocele 2011   Past Surgical History:  Procedure Laterality Date   cataract surgery  Bilateral    R 03/2001; L 05/2001.   COLONOSCOPY     HYSTEROSCOPY  11/1999   w/ resection of endometrial polyp by uterine curetting.   INTRAMEDULLARY (IM) NAIL INTERTROCHANTERIC Right 07/15/2020   Procedure:  INTRAMEDULLARY (IM) NAIL INTERTROCHANTRIC;  Surgeon: Juanell Fairly, MD;  Location: [facility] ORS;  Service: Orthopedics;  Laterality: Right;   JOINT REPLACEMENT Bilateral R-9/04, L- 8/05   Total Knee Arthroplasties.   KYPHOPLASTY N/A 04/10/2019   Procedure: KYPHOPLASTY;  Surgeon: Kennedy Bucker, MD;  Location: [facility] ORS;  Service: Orthopedics;  Laterality: N/A;   REVERSE SHOULDER ARTHROPLASTY Right 11/26/2013   Procedure: RIGHT REVERSE SHOULDER ARTHROPLASTY;  Surgeon: Senaida Lange, MD;  Location: [facility] OR;  Service: Orthopedics;  Laterality: Right;   SHOULDER ARTHROSCOPY Right    THYROIDECTOMY  at age 24   TONSILLECTOMY      Allergies  Allergen Reactions   Sulfonamide Derivatives Hives   Sulfa Antibiotics Other (See Comments) and Rash    Outpatient Encounter Medications as of 07/09/2022  Medication Sig   acetaminophen (TYLENOL) 500 MG tablet Take 1,000 mg by mouth 3 (three) times daily.   aspirin 325 MG tablet Take 1 tablet (325 mg total) by mouth daily.   atorvastatin (LIPITOR) 40 MG tablet Take 1 tablet (40 mg total) by mouth daily.   bismuth subsalicylate (PEPTO BISMOL) 262 MG/15ML suspension Take 30 mLs by mouth every 4 (four) hours as needed.   calcium carbonate (OSCAL) 1500 (600 Ca) MG TABS tablet Take 600 mg of elemental calcium by mouth daily.   cholecalciferol (VITAMIN D) 1000  units tablet Take 1,000 Units by mouth daily.   ferrous sulfate 325 (65 FE) MG tablet Take 1 tablet (325 mg total) by mouth every other day.   mirabegron ER (MYRBETRIQ) 25 MG TB24 tablet Take 25 mg by mouth daily.   mirtazapine (REMERON) 7.5 MG tablet Take 7.5 mg by mouth at bedtime.   polyethylene glycol (MIRALAX / GLYCOLAX) 17 g packet Take 17 g by mouth every other day.   saccharomyces boulardii (FLORASTOR) 250 MG capsule Take 250 mg by mouth daily.   UNABLE TO FIND Take 4 oz by mouth every evening. Med Name: Medpass   No facility-administered encounter medications on file as of 07/09/2022.    Review of  Systems  Immunization History  Administered Date(s) Administered   Covid-19, Mrna,Vaccine(Spikevax)74yrs and older 12/29/2021   Influenza-Unspecified 12/05/2021   Pneumococcal Conjugate-13 03/31/2014   Pneumococcal Polysaccharide-23 04/17/2016   Pneumococcal-Unspecified 02/19/2001   Tdap 11/13/2011   Unspecified SARS-COV-2 Vaccination 03/06/2019, 04/03/2019, 01/05/2020, 07/07/2020, 07/18/2021   Zoster Recombinat (Shingrix) 04/30/2008, 06/09/2019, 10/21/2019   Pertinent  Health Maintenance Due  Topic Date Due   DEXA SCAN  Never done   INFLUENZA VACCINE  09/20/2022      06/21/2021    6:56 PM 06/22/2021    8:36 AM 06/22/2021    1:00 PM 06/22/2021    9:40 PM 06/27/2021   11:48 PM  Fall Risk  (RETIRED) Patient Fall Risk Level High fall risk High fall risk High fall risk High fall risk High fall risk   Functional Status Survey:    Vitals:   07/09/22 1034  BP: 105/68  Pulse: 84  Resp: 18  Temp: 97.7 F (36.5 C)  SpO2: 98%  Weight: 115 lb (52.2 kg)  Height: 5\' 5"  (1.651 m)   Body mass index is 19.14 kg/m. Physical Exam Skin:    Comments: Right face with pinpoint petechial rash of the right face. Nonblanchable.   Neurological:     Mental Status: She is alert. Mental status is at baseline. She is disoriented.     Labs reviewed: Recent Labs    03/22/22 0000 06/20/22 0000 06/22/22 1347  NA 142 142 141  K 4.2 4.0 4.0  CL 106 107 107  CO2 28* 25* 26  GLUCOSE  --   --  144*  BUN 20 20 23   CREATININE 0.8 0.7 0.89  CALCIUM 9.1 8.2* 9.0   Recent Labs    12/18/21 0000 06/22/22 1347  AST 21 27  ALT 17 16  ALKPHOS 97 88  BILITOT  --  0.7  PROT  --  7.0  ALBUMIN 3.7 3.3*   Recent Labs    03/22/22 0000 06/20/22 0000 06/22/22 1347  WBC 9.5 5.8 9.2  NEUTROABS 6,897.00 2,384.00 5.8  HGB 11.0* 9.6* 10.0*  HCT 33* 29* 30.9*  MCV  --   --  101.0*  PLT 348 313 399   No results found for: "TSH" Lab Results  Component Value Date   HGBA1C 5.2 06/22/2022   Lab  Results  Component Value Date   CHOL 154 06/23/2022   HDL 67 06/23/2022   LDLCALC 78 06/23/2022   TRIG 45 06/23/2022   CHOLHDL 2.3 06/23/2022    Significant Diagnostic Results in last 30 days:  ECHOCARDIOGRAM COMPLETE BUBBLE STUDY  Result Date: 06/25/2022    ECHOCARDIOGRAM REPORT   Patient Name:   Melissa [name] Date of Exam: 06/24/2022 Medical Rec #:  [id number]      Height:  65.0 in Accession #:    [id number]     Weight:       110.0 lb Date of Birth:  November 03, 1928       BSA:          1.534 m Patient Age:    89 years       BP:           154/69 mmHg Patient Gender: F              HR:           72 bpm. [facility] Procedure: 2D Echo, Cardiac Doppler, Color Doppler and Saline Contrast Bubble            Study Indications:     Stroke 434.91 / I63.9  History:         Patient has prior history of Echocardiogram examinations, most                  recent 06/23/2022. Signs/Symptoms:Chest Pain. Migraines.  Sonographer:     Cristela Blue Referring Phys:  [id number] ERIC LINDZEN Diagnosing Phys: Lorine Bears MD  Sonographer Comments: Technically challenging study due to limited acoustic windows, no apical window and no subcostal window. IMPRESSIONS  1. Left ventricular ejection fraction, by estimation, is 60 to 65%. The left ventricle has normal function. The left ventricle has no regional wall motion abnormalities. Left ventricular diastolic function could not be evaluated.  2. Right ventricular systolic function is normal. The right ventricular size is normal. Tricuspid regurgitation signal is inadequate for assessing PA pressure.  3. Left atrial size was mildly dilated.  4. The mitral valve is normal in structure. Mild mitral valve regurgitation. No evidence of mitral stenosis.  5. The aortic valve is normal in structure. Aortic valve regurgitation is mild. Mild aortic valve stenosis.  6. Agitated saline contrast bubble study was negative, with no evidence of any interatrial shunt.  7. Technically challenging  study due to limited acoustic windows, no apical window and no subcostal window. FINDINGS  Left Ventricle: Left ventricular ejection fraction, by estimation, is 60 to 65%. The left ventricle has normal function. The left ventricle has no regional wall motion abnormalities. The left ventricular internal cavity size was normal in size. There is  no left ventricular hypertrophy. Left ventricular diastolic function could not be evaluated. Right Ventricle: The right ventricular size is normal. No increase in right ventricular wall thickness. Right ventricular systolic function is normal. Tricuspid regurgitation signal is inadequate for assessing PA pressure. Left Atrium: Left atrial size was mildly dilated. Right Atrium: Right atrial size was normal in size. Pericardium: There is no evidence of pericardial effusion. Mitral Valve: The mitral valve is normal in structure. Mild mitral valve regurgitation. No evidence of mitral valve stenosis. Tricuspid Valve: The tricuspid valve is normal in structure. Tricuspid valve regurgitation is not demonstrated. No evidence of tricuspid stenosis. Aortic Valve: The aortic valve is normal in structure. Aortic valve regurgitation is mild. Mild aortic stenosis is present. Pulmonic Valve: The pulmonic valve was normal in structure. Pulmonic valve regurgitation is mild. No evidence of pulmonic stenosis. Aorta: The aortic root is normal in size and structure. Venous: The inferior vena cava was not well visualized. IAS/Shunts: No atrial level shunt detected by color flow Doppler. Agitated saline contrast was given intravenously to evaluate for intracardiac shunting. Agitated saline contrast bubble study was negative, with no evidence of any interatrial shunt.  LEFT VENTRICLE PLAX 2D LVIDd:  4.20 cm LVIDs:         2.80 cm LV PW:         1.10 cm LV IVS:        1.00 cm LVOT diam:     2.00 cm LVOT Area:     3.14 cm  LEFT ATRIUM         Index LA diam:    3.80 cm 2.48 cm/m   AORTA Ao Root  diam: 3.10 cm  SHUNTS Systemic Diam: 2.00 cm Lorine Bears MD Electronically signed by Lorine Bears MD Signature Date/Time: 06/25/2022/4:59:52 PM    Final    ECHOCARDIOGRAM COMPLETE  Result Date: 06/23/2022    ECHOCARDIOGRAM REPORT   Patient Name:   Melissa [name] Date of Exam: 06/23/2022 Medical Rec #:  [id number]      Height:       65.0 in Accession #:    [id number]     Weight:       110.0 lb Date of Birth:  1928-05-04       BSA:          1.534 m Patient Age:    89 years       BP:           148/58 mmHg Patient Gender: F              HR:           75 bpm. Exam Location:  [facility] Procedure: 2D Echo, Color Doppler and Cardiac Doppler Indications:     Stroke I63.9  History:         Patient has no prior history of Echocardiogram examinations.                  Murmur.  Sonographer:     L. Thornton-Maynard Referring Phys:  [id number] Brien Few NIU Diagnosing Phys: Debbe Odea MD IMPRESSIONS  1. Left ventricular ejection fraction, by estimation, is 55 to 60%. The left ventricle has normal function. The left ventricle has no regional wall motion abnormalities. Left ventricular diastolic parameters were normal.  2. Right ventricular systolic function is normal. The right ventricular size is normal. There is normal pulmonary artery systolic pressure.  3. The mitral valve is normal in structure. Mild to moderate mitral valve regurgitation.  4. The aortic valve is calcified. Aortic valve regurgitation is mild. Mild aortic valve stenosis. Aortic valve area, by VTI measures 1.49 cm. Aortic valve mean gradient measures 11.5 mmHg. Aortic valve Vmax measures 2.16 m/s.  5. The inferior vena cava is normal in size with greater than 50% respiratory variability, suggesting right atrial pressure of 3 mmHg. FINDINGS  Left Ventricle: Left ventricular ejection fraction, by estimation, is 55 to 60%. The left ventricle has normal function. The left ventricle has no regional wall motion abnormalities. The left ventricular internal cavity size was  normal in size. There is  no left ventricular hypertrophy. Left ventricular diastolic parameters were normal. Right Ventricle: The right ventricular size is normal. No increase in right ventricular wall thickness. Right ventricular systolic function is normal. There is normal pulmonary artery systolic pressure. The tricuspid regurgitant velocity is 2.79 m/s, and  with an assumed right atrial pressure of 3 mmHg, the estimated right ventricular systolic pressure is 34.1 mmHg. Left Atrium: Left atrial size was normal in size. Right Atrium: Right atrial size was normal in size. Pericardium: There is no evidence of pericardial effusion. Mitral Valve: The mitral valve is normal in structure. Mild to moderate mitral valve  regurgitation. MV peak gradient, 4.2 mmHg. The mean mitral valve gradient is 2.0 mmHg. Tricuspid Valve: The tricuspid valve is normal in structure. Tricuspid valve regurgitation is trivial. Aortic Valve: The aortic valve is calcified. Aortic valve regurgitation is mild. Aortic regurgitation PHT measures 531 msec. Mild aortic stenosis is present. Aortic valve mean gradient measures 11.5 mmHg. Aortic valve peak gradient measures 18.7 mmHg. Aortic valve area, by VTI measures 1.49 cm. Pulmonic Valve: The pulmonic valve was grossly normal. Pulmonic valve regurgitation is trivial. Aorta: The aortic root is normal in size and structure. Venous: The inferior vena cava is normal in size with greater than 50% respiratory variability, suggesting right atrial pressure of 3 mmHg. IAS/Shunts: No atrial level shunt detected by color flow Doppler.  LEFT VENTRICLE PLAX 2D LVIDd:         4.10 cm     Diastology LVIDs:         2.80 cm     LV e' medial:    5.55 cm/s LV PW:         1.30 cm     LV E/e' medial:  13.3 LV IVS:        1.20 cm     LV e' lateral:   7.40 cm/s LVOT diam:     2.10 cm     LV E/e' lateral: 10.0 LV SV:         69 LV SV Index:   45 LVOT Area:     3.46 cm  LV Volumes (MOD) LV vol d, MOD A2C: 45.0 ml LV vol  d, MOD A4C: 50.8 ml LV vol s, MOD A2C: 13.5 ml LV vol s, MOD A4C: 14.6 ml LV SV MOD A2C:     31.5 ml LV SV MOD A4C:     50.8 ml LV SV MOD BP:      33.4 ml RIGHT VENTRICLE             IVC RV Basal diam:  3.80 cm     IVC diam: 1.80 cm RV S prime:     21.10 cm/s TAPSE (M-mode): 3.4 cm LEFT ATRIUM             Index        RIGHT ATRIUM           Index LA diam:        3.70 cm 2.41 cm/m   RA Area:     13.80 cm LA Vol (A2C):   76.3 ml 49.73 ml/m  RA Volume:   31.70 ml  20.66 ml/m LA Vol (A4C):   43.9 ml 28.61 ml/m LA Biplane Vol: 61.2 ml 39.89 ml/m  AORTIC VALVE                     PULMONIC VALVE AV Area (Vmax):    1.73 cm      PV Vmax:          0.89 m/s AV Area (Vmean):   1.32 cm      PV Peak grad:     3.2 mmHg AV Area (VTI):     1.49 cm      PR End Diast Vel: 4.36 msec AV Vmax:           216.00 cm/s AV Vmean:          164.500 cm/s AV VTI:            0.462 m AV Peak Grad:      18.7 mmHg AV  Mean Grad:      11.5 mmHg LVOT Vmax:         108.00 cm/s LVOT Vmean:        62.500 cm/s LVOT VTI:          0.199 m LVOT/AV VTI ratio: 0.43 AI PHT:            531 msec  AORTA Ao Root diam: 3.50 cm Ao Asc diam:  3.50 cm MITRAL VALVE                  TRICUSPID VALVE MV Area (PHT): 3.91 cm       TR Peak grad:   31.1 mmHg MV Area VTI:   3.13 cm       TR Vmax:        279.00 cm/s MV Peak grad:  4.2 mmHg MV Mean grad:  2.0 mmHg       SHUNTS MV Vmax:       1.02 m/s       Systemic VTI:  0.20 m MV Vmean:      69.0 cm/s      Systemic Diam: 2.10 cm MV Decel Time: 194 msec MR Peak grad:    173.7 mmHg MR Mean grad:    111.0 mmHg MR Vmax:         659.00 cm/s MR Vmean:        503.0 cm/s MR PISA:         0.57 cm MR PISA Eff ROA: 3 mm MR PISA Radius:  0.30 cm MV E velocity: 73.90 cm/s MV A velocity: 79.10 cm/s MV E/A ratio:  0.93 Debbe Odea MD Electronically signed by Debbe Odea MD Signature Date/Time: 06/23/2022/5:14:32 PM    Final    CT ANGIO HEAD NECK W WO CM  Result Date: 06/22/2022 CLINICAL DATA:  Stroke suspected EXAM: CT  ANGIOGRAPHY HEAD AND NECK WITH AND WITHOUT CONTRAST TECHNIQUE: Multidetector CT imaging of the head and neck was performed using the standard protocol during bolus administration of intravenous contrast. Multiplanar CT image reconstructions and MIPs were obtained to evaluate the vascular anatomy. Carotid stenosis measurements (when applicable) are obtained utilizing NASCET criteria, using the distal internal carotid diameter as the denominator. RADIATION DOSE REDUCTION: This exam was performed according to the departmental dose-optimization program which includes automated exposure control, adjustment of the mA and/or kV according to patient size and/or use of iterative reconstruction technique. CONTRAST:  75mL OMNIPAQUE IOHEXOL 350 MG/ML SOLN COMPARISON:  Same day CT head and MR head FINDINGS: CT HEAD FINDINGS See same day CT brain for intracranial findings. Infarcts seen on same day brain MRI are not visualized on this exam due to a combination of CT technique and post-contrast imaging. The presence of IV contrast also limits the ability to assess for intracranial blood products. CTA NECK FINDINGS Aortic arch: Two-vessel aortic arch. Imaged portion shows no evidence of aneurysm or dissection. No significant stenosis of the major arch vessel origins. There is a small amount of soft plaque in the proximal left subclavian artery. Right carotid system: No evidence of dissection, stenosis (50% or greater), or occlusion. Left carotid system: No evidence of dissection, stenosis (50% or greater), or occlusion. Vertebral arteries: Codominant. No evidence of dissection, stenosis (50% or greater), or occlusion. The V4 segment of the left vertebral artery is small in caliber Skeleton: Negative. Other neck: Negative. Upper chest: Negative. Review of the MIP images confirms the above findings CTA HEAD FINDINGS Anterior circulation: There is moderate narrowing in  the proximal supraclinoid ICA on the right. No aneurysm. No large  vessel occlusion Posterior circulation: No significant stenosis, proximal occlusion, aneurysm, or vascular malformation. Venous sinuses: As permitted by contrast timing, patent. Anatomic variants: None Review of the MIP images confirms the above findings IMPRESSION: 1. No large vessel occlusion. Moderate narrowing in the proximal supraclinoid right ICA. 2. No hemodynamically significant stenosis in the neck. 3. Infarcts seen on same day brain MRI are not visualized on this exam due to a combination of CT technique and post-contrast imaging. The presence of IV contrast also limits the ability to assess for intracranial blood products. Electronically Signed   By: Lorenza Cambridge M.D.   On: 06/22/2022 17:58   MR BRAIN WO CONTRAST  Result Date: 06/22/2022 CLINICAL DATA:  Headache, neuro deficit, suspected stroke. Left-sided weakness. EXAM: MRI HEAD WITHOUT CONTRAST TECHNIQUE: Multiplanar, multiecho pulse sequences of the brain and surrounding structures were obtained without intravenous contrast. COMPARISON:  MRI brain 07/03/2018.  Head CT 06/22/2022. FINDINGS: Brain: Acute infarcts in the right corona radiata and inferior right parietal lobe (image 31 series 5). No acute hemorrhage or significant mass effect. Old infarct in the right occipital lobe. Old lacunar infarcts in the right globus pallidus and bilateral cerebellar hemispheres. Underlying moderate chronic small-vessel disease. No hydrocephalus or extra-axial collection. Vascular: Normal flow voids. Skull and upper cervical spine: Normal marrow signal. Sinuses/Orbits: Unremarkable. Other: None. IMPRESSION: Acute infarcts in the right corona radiata and inferior right parietal lobe. No acute hemorrhage or significant mass effect. Electronically Signed   By: Orvan Falconer M.D.   On: 06/22/2022 16:22   CT HEAD WO CONTRAST ( )  Result Date: 06/22/2022 CLINICAL DATA:  New onset headache. EXAM: CT HEAD WITHOUT CONTRAST TECHNIQUE: Contiguous axial images were  obtained from the base of the skull through the vertex without intravenous contrast. RADIATION DOSE REDUCTION: This exam was performed according to the departmental dose-optimization program which includes automated exposure control, adjustment of the mA and/or kV according to patient size and/or use of iterative reconstruction technique. COMPARISON:  06/28/2021. FINDINGS: Brain: No evidence of acute infarction, hemorrhage, hydrocephalus, extra-axial collection or mass lesion/mass effect. Old right occipital and superior cerebellar infarcts. Vascular: No hyperdense vessel or unexpected calcification. Skull: Normal. Negative for fracture or focal lesion. Sinuses/Orbits: Globes and orbits are unremarkable. Sinuses are clear. Other: None. IMPRESSION: 1. No acute intracranial abnormalities. No change from the previous head CT. Electronically Signed   By: Amie Portland M.D.   On: 06/22/2022 14:43    Assessment/Plan Petechial rash Unclear if rash is due to new start of ASA. Recommend to continue monitoring rash at this time. If worsening, will consider additional labs and/or treatment as indicated.   Family/ staff Communication: nursing  Labs/tests ordered:  none

## 2022-07-12 LAB — BASIC METABOLIC PANEL
BUN: 28 — AB (ref 4–21)
CO2: 26 — AB (ref 13–22)
Chloride: 107 (ref 99–108)
Creatinine: 0.7 (ref 0.5–1.1)
Glucose: 72
Potassium: 4.2 mEq/L (ref 3.5–5.1)
Sodium: 141 (ref 137–147)

## 2022-07-12 LAB — COMPREHENSIVE METABOLIC PANEL
Albumin: 3.5 (ref 3.5–5.0)
Calcium: 8.5 — AB (ref 8.7–10.7)
Globulin: 2.8
eGFR: 78

## 2022-07-12 LAB — CBC AND DIFFERENTIAL
HCT: 26 — AB (ref 36–46)
Hemoglobin: 8.8 — AB (ref 12.0–16.0)
Platelets: 374 10*3/uL (ref 150–400)
WBC: 6

## 2022-07-12 LAB — HEPATIC FUNCTION PANEL
ALT: 19 U/L (ref 7–35)
AST: 23 (ref 13–35)
Alkaline Phosphatase: 83 (ref 25–125)
Bilirubin, Total: 0.4

## 2022-07-12 LAB — CBC: RBC: 2.68 — AB (ref 3.87–5.11)

## 2022-07-23 LAB — IRON,TIBC AND FERRITIN PANEL
%SAT: 62
Ferritin: 688
Iron: 133
TIBC: 216

## 2022-07-25 ENCOUNTER — Encounter: Payer: Self-pay | Admitting: Student

## 2022-07-25 ENCOUNTER — Non-Acute Institutional Stay (SKILLED_NURSING_FACILITY): Payer: Medicare Other | Admitting: Student

## 2022-07-25 DIAGNOSIS — E43 Unspecified severe protein-calorie malnutrition: Secondary | ICD-10-CM

## 2022-07-25 DIAGNOSIS — Z66 Do not resuscitate: Secondary | ICD-10-CM

## 2022-07-25 DIAGNOSIS — M4857XA Collapsed vertebra, not elsewhere classified, lumbosacral region, initial encounter for fracture: Secondary | ICD-10-CM

## 2022-07-25 DIAGNOSIS — F32A Depression, unspecified: Secondary | ICD-10-CM | POA: Diagnosis not present

## 2022-07-25 DIAGNOSIS — I69354 Hemiplegia and hemiparesis following cerebral infarction affecting left non-dominant side: Secondary | ICD-10-CM | POA: Diagnosis not present

## 2022-07-25 DIAGNOSIS — S72141A Displaced intertrochanteric fracture of right femur, initial encounter for closed fracture: Secondary | ICD-10-CM | POA: Diagnosis not present

## 2022-07-25 DIAGNOSIS — D509 Iron deficiency anemia, unspecified: Secondary | ICD-10-CM | POA: Diagnosis not present

## 2022-07-25 DIAGNOSIS — N3281 Overactive bladder: Secondary | ICD-10-CM

## 2022-07-25 NOTE — Progress Notes (Signed)
Location:  [facility] [place].  Nursing Home Room Number: [facility] [address] Place of Service:  SNF [phone]) Provider:  Earnestine Mealing, MD  Patient Care Team: Earnestine Mealing, MD as PCP - General [facility] Medicine)  Extended Emergency Contact Information Primary Emergency Contact: Aydin,[name] Address: [address]          [place], [place] [address] Darden [place] of [place] Home Phone: [phone] Relation: Spouse Secondary Emergency Contact: Moroz,[name] Mobile Phone: [phone] Relation: Son  Code Status:  DNR Goals of care: Advanced Directive information    07/25/2022   10:43 AM  Advanced Directives  Does Patient Have a Medical Advance Directive? Yes  Type of Advance Directive Out of facility DNR (pink MOST or yellow form)  Does patient want to make changes to medical advance directive? No - Patient declined     Chief Complaint  Patient presents with   Medical Management of Chronic Issues    Medical Management of Chronic Issues.     HPI:  Pt is a 89 y.o. female seen today for medical management of chronic diseases.   Patient is alert and saying she needs to get her receipt to her husband or she will be in trouble. She can give her name, husband's name an son's name but unable to converse in a sensible manner.   Nursing with concern that patient continues to become weaker and have challenges with sitting up straight since her stroke.     Past Medical History:  Diagnosis Date   Arthritis    Chest pain    a. 05/2015 - post-prandial.   GERD (gastroesophageal reflux disease)    a. takes Omeprazole daily, prev seen by D. Brodie.   Heart murmur    a. 05/2013 Echo: EF 55-60%, gr2 DD, mild AI/MR.   History of blood transfusion    no abnormal reaction noted    History of migraine headaches    a. last 10/2013   Iron deficiency anemia    Joint pain    PONV (postoperative nausea and vomiting)    Rectocele 2011   Past Surgical History:  Procedure Laterality Date   cataract  surgery  Bilateral    R 03/2001; L 05/2001.   COLONOSCOPY     HYSTEROSCOPY  11/1999   w/ resection of endometrial polyp by uterine curetting.   INTRAMEDULLARY (IM) NAIL INTERTROCHANTERIC Right 07/15/2020   Procedure: INTRAMEDULLARY (IM) NAIL INTERTROCHANTRIC;  Surgeon: Juanell Fairly, MD;  Location: [facility] ORS;  Service: Orthopedics;  Laterality: Right;   JOINT REPLACEMENT Bilateral R-9/04, L- 8/05   Total Knee Arthroplasties.   KYPHOPLASTY N/A 04/10/2019   Procedure: KYPHOPLASTY;  Surgeon: Kennedy Bucker, MD;  Location: [facility] ORS;  Service: Orthopedics;  Laterality: N/A;   REVERSE SHOULDER ARTHROPLASTY Right 11/26/2013   Procedure: RIGHT REVERSE SHOULDER ARTHROPLASTY;  Surgeon: Senaida Lange, MD;  Location: [facility] OR;  Service: Orthopedics;  Laterality: Right;   SHOULDER ARTHROSCOPY Right    THYROIDECTOMY  at age 11   TONSILLECTOMY      Allergies  Allergen Reactions   Sulfonamide Derivatives Hives   Sulfa Antibiotics Other (See Comments) and Rash    Outpatient Encounter Medications as of 07/25/2022  Medication Sig   acetaminophen (TYLENOL) 500 MG tablet Take 1,000 mg by mouth 3 (three) times daily.   aspirin EC 81 MG tablet Take 81 mg by mouth daily. Swallow whole.   atorvastatin (LIPITOR) 40 MG tablet Take 1 tablet (40 mg total) by mouth daily.   bismuth subsalicylate (PEPTO BISMOL) 262 MG/15ML  suspension Take 30 mLs by mouth every 4 (four) hours as needed.   calcium carbonate (OSCAL) 1500 (600 Ca) MG TABS tablet Take 600 mg of elemental calcium by mouth daily.   cholecalciferol (VITAMIN D) 1000 units tablet Take 1,000 Units by mouth daily.   ferrous sulfate 325 (65 FE) MG tablet Take 1 tablet (325 mg total) by mouth every other day.   mirabegron ER (MYRBETRIQ) 25 MG TB24 tablet Take 25 mg by mouth daily.   mirtazapine (REMERON) 7.5 MG tablet Take 7.5 mg by mouth at bedtime.   polyethylene glycol (MIRALAX / GLYCOLAX) 17 g packet Take 17 g by mouth every other day.   saccharomyces boulardii  (FLORASTOR) 250 MG capsule Take 250 mg by mouth daily.   UNABLE TO FIND Take 4 oz by mouth every evening. Med Name: Medpass   [DISCONTINUED] aspirin 325 MG tablet Take 1 tablet (325 mg total) by mouth daily.   No facility-administered encounter medications on file as of 07/25/2022.    Review of Systems  Immunization History  Administered Date(s) Administered   Covid-19, Mrna,Vaccine(Spikevax)107yrs and older 12/29/2021   Influenza-Unspecified 12/05/2021   Pneumococcal Conjugate-13 03/31/2014   Pneumococcal Polysaccharide-23 04/17/2016   Pneumococcal-Unspecified 02/19/2001   Tdap 11/13/2011   Unspecified SARS-COV-2 Vaccination 03/06/2019, 04/03/2019, 01/05/2020, 07/07/2020, 07/18/2021   Zoster Recombinat (Shingrix) 04/30/2008, 06/09/2019, 10/21/2019   Pertinent  Health Maintenance Due  Topic Date Due   DEXA SCAN  Never done   INFLUENZA VACCINE  09/20/2022      06/21/2021    6:56 PM 06/22/2021    8:36 AM 06/22/2021    1:00 PM 06/22/2021    9:40 PM 06/27/2021   11:48 PM  Fall Risk  (RETIRED) Patient Fall Risk Level High fall risk High fall risk High fall risk High fall risk High fall risk   Functional Status Survey:    Vitals:   07/25/22 1038  BP: 116/66  Pulse: 72  Resp: 19  Temp: (!) 97.3 F (36.3 C)  SpO2: 95%  Weight: 115 lb 6.4 oz (52.3 kg)  Height: 5\' 5"  (1.651 m)   Body mass index is 19.2 kg/m. Physical Exam Cardiovascular:     Rate and Rhythm: Normal rate.     Pulses: Normal pulses.  Pulmonary:     Effort: Pulmonary effort is normal.  Neurological:     Mental Status: She is alert. Mental status is at baseline. She is disoriented.     Labs reviewed: Recent Labs    06/20/22 0000 06/22/22 1347 07/02/22 0000 07/12/22 0000  NA 142 141 144 141  K 4.0 4.0  --  4.2  CL 107 107 108 107  CO2 25* 26 28* 26*  GLUCOSE  --  144*  --   --   BUN 20 23 26* 28*  CREATININE 0.7 0.89 0.6 0.7  CALCIUM 8.2* 9.0 8.4* 8.5*   Recent Labs    12/18/21 0000 06/22/22 1347  07/12/22 0000  AST 21 27 23   ALT 17 16 19   ALKPHOS 97 88 83  BILITOT  --  0.7  --   PROT  --  7.0  --   ALBUMIN 3.7 3.3* 3.5   Recent Labs    03/22/22 0000 06/20/22 0000 06/22/22 1347 07/02/22 0000 07/12/22 0000  WBC 9.5 5.8 9.2 9.9 6.0  NEUTROABS 6,897.00 2,384.00 5.8  --   --   HGB 11.0* 9.6* 10.0* 9.4* 8.8*  HCT 33* 29* 30.9* 29* 26*  MCV  --   --  101.0*  --   --  PLT 348 313 399 380 374   No results found for: "TSH" Lab Results  Component Value Date   HGBA1C 5.2 06/22/2022   Lab Results  Component Value Date   CHOL 154 06/23/2022   HDL 67 06/23/2022   LDLCALC 78 06/23/2022   TRIG 45 06/23/2022   CHOLHDL 2.3 06/23/2022    Significant Diagnostic Results in last 30 days:  No results found.  Assessment/Plan 1. Hemiparesis affecting left side as late effect of stroke (HCC) Recent stroke. No hx of htn. Now on ASA 81 and Lipitor. OT to help with positioning appropriate eating utensils.   2. Protein-calorie malnutrition, severe (HCC) Patient has low BMI, however, continues eating each meal. Continue mirtazapine 7.5 mg.   3. Compression fracture of lumbosacral vertebra, nontraumatic, initial encounter with retropulsion 04/09/2019 4. Closed intertrochanteric fracture of hip, right, initial encounter (HCC) 07/14/2020 Dx of osteoporosis. Continue vitamin D. And calcium supplementation.   5. Depression, unspecified depression type Mood is stable with low dose of mirtazapine daily. Continue to monitor.   6. Do not resuscitate Patient maintains DNR. Status   7. OAB Patient is incontinent, consider discontinuing at f/u .   8. IDA Continue Iron supplementation most recent hgb 8.8.   Family/ staff Communication: nursing  Labs/tests ordered:  none

## 2022-08-13 DIAGNOSIS — M6281 Muscle weakness (generalized): Secondary | ICD-10-CM | POA: Diagnosis not present

## 2022-08-13 DIAGNOSIS — Z9181 History of falling: Secondary | ICD-10-CM | POA: Diagnosis not present

## 2022-08-13 DIAGNOSIS — I69398 Other sequelae of cerebral infarction: Secondary | ICD-10-CM | POA: Diagnosis not present

## 2022-08-13 DIAGNOSIS — R278 Other lack of coordination: Secondary | ICD-10-CM | POA: Diagnosis not present

## 2022-08-13 DIAGNOSIS — F0150 Vascular dementia without behavioral disturbance: Secondary | ICD-10-CM | POA: Diagnosis not present

## 2022-08-14 DIAGNOSIS — B351 Tinea unguium: Secondary | ICD-10-CM | POA: Diagnosis not present

## 2022-08-14 DIAGNOSIS — I7091 Generalized atherosclerosis: Secondary | ICD-10-CM | POA: Diagnosis not present

## 2022-08-15 DIAGNOSIS — M6281 Muscle weakness (generalized): Secondary | ICD-10-CM | POA: Diagnosis not present

## 2022-08-15 DIAGNOSIS — Z9181 History of falling: Secondary | ICD-10-CM | POA: Diagnosis not present

## 2022-08-15 DIAGNOSIS — R278 Other lack of coordination: Secondary | ICD-10-CM | POA: Diagnosis not present

## 2022-08-15 DIAGNOSIS — I69398 Other sequelae of cerebral infarction: Secondary | ICD-10-CM | POA: Diagnosis not present

## 2022-08-15 DIAGNOSIS — F0150 Vascular dementia without behavioral disturbance: Secondary | ICD-10-CM | POA: Diagnosis not present

## 2022-08-17 DIAGNOSIS — I69398 Other sequelae of cerebral infarction: Secondary | ICD-10-CM | POA: Diagnosis not present

## 2022-08-17 DIAGNOSIS — R278 Other lack of coordination: Secondary | ICD-10-CM | POA: Diagnosis not present

## 2022-08-17 DIAGNOSIS — M6281 Muscle weakness (generalized): Secondary | ICD-10-CM | POA: Diagnosis not present

## 2022-08-17 DIAGNOSIS — Z9181 History of falling: Secondary | ICD-10-CM | POA: Diagnosis not present

## 2022-08-17 DIAGNOSIS — F0150 Vascular dementia without behavioral disturbance: Secondary | ICD-10-CM | POA: Diagnosis not present

## 2022-08-20 DIAGNOSIS — D649 Anemia, unspecified: Secondary | ICD-10-CM | POA: Diagnosis not present

## 2022-08-20 LAB — CBC AND DIFFERENTIAL
HCT: 29 — AB (ref 36–46)
Hemoglobin: 9.2 — AB (ref 12.0–16.0)
Platelets: 330 10*3/uL (ref 150–400)
WBC: 11.8

## 2022-08-20 LAB — CBC: RBC: 2.85 — AB (ref 3.87–5.11)

## 2022-08-21 ENCOUNTER — Non-Acute Institutional Stay (SKILLED_NURSING_FACILITY): Payer: Medicare Other | Admitting: Nurse Practitioner

## 2022-08-21 ENCOUNTER — Encounter: Payer: Self-pay | Admitting: Nurse Practitioner

## 2022-08-21 DIAGNOSIS — I69354 Hemiplegia and hemiparesis following cerebral infarction affecting left non-dominant side: Secondary | ICD-10-CM | POA: Diagnosis not present

## 2022-08-21 DIAGNOSIS — F01518 Vascular dementia, unspecified severity, with other behavioral disturbance: Secondary | ICD-10-CM

## 2022-08-21 DIAGNOSIS — E43 Unspecified severe protein-calorie malnutrition: Secondary | ICD-10-CM | POA: Diagnosis not present

## 2022-08-21 DIAGNOSIS — N3281 Overactive bladder: Secondary | ICD-10-CM

## 2022-08-21 DIAGNOSIS — K5904 Chronic idiopathic constipation: Secondary | ICD-10-CM

## 2022-08-21 DIAGNOSIS — D509 Iron deficiency anemia, unspecified: Secondary | ICD-10-CM | POA: Diagnosis not present

## 2022-08-21 NOTE — Progress Notes (Signed)
Location:  [facility].  Nursing Home Room Number: [facility] [address] Place of Service:  SNF [phone]) [name], NP  PCP: Earnestine Mealing, MD  Patient Care Team: Earnestine Mealing, MD as PCP - General [facility])  Extended Emergency Contact Information Primary Emergency Contact: Dowda,[name] Address: [address]          [place], [place] [address] [place] of [place] Home Phone: [phone] Relation: Spouse Secondary Emergency Contact: Menz,[name] Mobile Phone: [phone] Relation: Son  Goals of care: Advanced Directive information    08/21/2022   11:47 AM  Advanced Directives  Does Patient Have a Medical Advance Directive? Yes  Type of Advance Directive Out of facility DNR (pink MOST or yellow form)  Does patient want to make changes to medical advance directive? No - Patient declined     Chief Complaint  Patient presents with   Medical Management of Chronic Issues    Medical Management of Chronic Issues.     HPI:  Pt is a 89 y.o. female seen today for medical management of chronic disease.  Pt with hx of cva, she leans occasionally and has adaptive utensils which have helped.  She feds herself with minimal assistance.  Nursing reports she is doing well with DME.  No concerns.  She had some skin tears but they have resolved.  Sleeps in the common area frequently but this is baseline.  Weight has been stable over the last 6 months.     Past Medical History:  Diagnosis Date   Arthritis    Chest pain    a. 05/2015 - post-prandial.   GERD (gastroesophageal reflux disease)    a. takes Omeprazole daily, prev seen by D. Brodie.   Heart murmur    a. 05/2013 Echo: EF 55-60%, gr2 DD, mild AI/MR.   History of blood transfusion    no abnormal reaction noted    History of migraine headaches    a. last 10/2013   Iron deficiency anemia    Joint pain    PONV (postoperative nausea and vomiting)    Rectocele 2011   Past Surgical History:  Procedure  Laterality Date   cataract surgery  Bilateral    R 03/2001; L 05/2001.   COLONOSCOPY     HYSTEROSCOPY  11/1999   w/ resection of endometrial polyp by uterine curetting.   INTRAMEDULLARY (IM) NAIL INTERTROCHANTERIC Right 07/15/2020   Procedure: INTRAMEDULLARY (IM) NAIL INTERTROCHANTRIC;  Surgeon: Juanell Fairly, MD;  Location: [facility] ORS;  Service: Orthopedics;  Laterality: Right;   JOINT REPLACEMENT Bilateral R-9/04, L- 8/05   Total Knee Arthroplasties.   KYPHOPLASTY N/A 04/10/2019   Procedure: KYPHOPLASTY;  Surgeon: Kennedy Bucker, MD;  Location: [facility] ORS;  Service: Orthopedics;  Laterality: N/A;   REVERSE SHOULDER ARTHROPLASTY Right 11/26/2013   Procedure: RIGHT REVERSE SHOULDER ARTHROPLASTY;  Surgeon: Senaida Lange, MD;  Location: [facility] OR;  Service: Orthopedics;  Laterality: Right;   SHOULDER ARTHROSCOPY Right    THYROIDECTOMY  at age 14   TONSILLECTOMY      Allergies  Allergen Reactions   Sulfonamide Derivatives Hives   Sulfa Antibiotics Other (See Comments) and Rash    Outpatient Encounter Medications as of 08/21/2022  Medication Sig   acetaminophen (TYLENOL) 500 MG tablet Take 1,000 mg by mouth 3 (three) times daily.   aspirin EC 81 MG tablet Take 81 mg by mouth daily. Swallow whole.   atorvastatin (LIPITOR) 40 MG tablet Take 1 tablet (40 mg total) by mouth daily.   bismuth subsalicylate (  PEPTO BISMOL) 262 MG/15ML suspension Take 30 mLs by mouth every 4 (four) hours as needed.   calcium carbonate (OSCAL) 1500 (600 Ca) MG TABS tablet Take 600 mg of elemental calcium by mouth daily.   cholecalciferol (VITAMIN D) 1000 units tablet Take 1,000 Units by mouth daily.   ferrous sulfate 325 (65 FE) MG tablet Take 1 tablet (325 mg total) by mouth every other day.   mirabegron ER (MYRBETRIQ) 25 MG TB24 tablet Take 25 mg by mouth daily.   mirtazapine (REMERON) 7.5 MG tablet Take 7.5 mg by mouth at bedtime.   polyethylene glycol (MIRALAX / GLYCOLAX) 17 g packet Take 17 g by mouth every other day.    saccharomyces boulardii (FLORASTOR) 250 MG capsule Take 250 mg by mouth daily.   UNABLE TO FIND Take 4 oz by mouth every evening. Med Name: Medpass   No facility-administered encounter medications on file as of 08/21/2022.    Review of Systems  Unable to perform ROS: Dementia     Immunization History  Administered Date(s) Administered   Covid-19, Mrna,Vaccine(Spikevax)84yrs and older 12/29/2021   Influenza-Unspecified 12/05/2021   Pneumococcal Conjugate-13 03/31/2014   Pneumococcal Polysaccharide-23 04/17/2016   Pneumococcal-Unspecified 02/19/2001   Tdap 11/13/2011   Unspecified SARS-COV-2 Vaccination 03/06/2019, 04/03/2019, 01/05/2020, 07/07/2020, 07/18/2021   Zoster Recombinant(Shingrix) 04/30/2008, 06/09/2019, 10/21/2019   Pertinent  Health Maintenance Due  Topic Date Due   DEXA SCAN  Never done   INFLUENZA VACCINE  09/20/2022      06/21/2021    6:56 PM 06/22/2021    8:36 AM 06/22/2021    1:00 PM 06/22/2021    9:40 PM 06/27/2021   11:48 PM  Fall Risk  (RETIRED) Patient Fall Risk Level High fall risk High fall risk High fall risk High fall risk High fall risk   Functional Status Survey:    Vitals:   08/21/22 1142  BP: 119/63  Pulse: 70  Resp: 20  Temp: 97.9 F (36.6 C)  SpO2: 92%  Weight: 116 lb 9.6 oz (52.9 kg)  Height: 5\' 5"  (1.651 m)   Body mass index is 19.4 kg/m. Physical Exam Constitutional:      General: She is not in acute distress.    Appearance: She is well-developed. She is not diaphoretic.  HENT:     Head: Normocephalic and atraumatic.     Mouth/Throat:     Pharynx: No oropharyngeal exudate.  Eyes:     Conjunctiva/sclera: Conjunctivae normal.     Pupils: Pupils are equal, round, and reactive to light.  Cardiovascular:     Rate and Rhythm: Normal rate and regular rhythm.     Heart sounds: Normal heart sounds.  Pulmonary:     Effort: Pulmonary effort is normal.     Breath sounds: Normal breath sounds.  Abdominal:     General: Bowel sounds are  normal.     Palpations: Abdomen is soft.  Musculoskeletal:     Cervical back: Normal range of motion and neck supple.     Right lower leg: No edema.     Left lower leg: No edema.  Skin:    General: Skin is warm and dry.  Neurological:     Mental Status: She is alert. She is disoriented.     Motor: Weakness present.     Gait: Gait abnormal.  Psychiatric:        Mood and Affect: Mood normal.     Labs reviewed: Recent Labs    06/20/22 0000 06/22/22 1347 07/02/22 0000 07/12/22 0000  NA 142 141 144 141  K 4.0 4.0  --  4.2  CL 107 107 108 107  CO2 25* 26 28* 26*  GLUCOSE  --  144*  --   --   BUN 20 23 26* 28*  CREATININE 0.7 0.89 0.6 0.7  CALCIUM 8.2* 9.0 8.4* 8.5*   Recent Labs    12/18/21 0000 06/22/22 1347 07/12/22 0000  AST 21 27 23   ALT 17 16 19   ALKPHOS 97 88 83  BILITOT  --  0.7  --   PROT  --  7.0  --   ALBUMIN 3.7 3.3* 3.5   Recent Labs    03/22/22 0000 06/20/22 0000 06/22/22 1347 07/02/22 0000 07/12/22 0000 08/20/22 0000  WBC 9.5 5.8 9.2 9.9 6.0 11.8  NEUTROABS 6,897.00 2,384.00 5.8  --   --   --   HGB 11.0* 9.6* 10.0* 9.4* 8.8* 9.2*  HCT 33* 29* 30.9* 29* 26* 29*  MCV  --   --  101.0*  --   --   --   PLT 348 313 399 380 374 330   No results found for: "TSH" Lab Results  Component Value Date   HGBA1C 5.2 06/22/2022   Lab Results  Component Value Date   CHOL 154 06/23/2022   HDL 67 06/23/2022   LDLCALC 78 06/23/2022   TRIG 45 06/23/2022   CHOLHDL 2.3 06/23/2022    Significant Diagnostic Results in last 30 days:  No results found.  Assessment/Plan 1. Hemiparesis affecting left side as late effect of stroke (HCC) -stable, continues on ASA and lipitor.continues with DME for support. No worsening of symptoms  2. Protein-calorie malnutrition, severe (HCC) -weight has been stable, continues on supplements and Remeron for appeitie.   3. OAB (overactive bladder) Stable on myrbetrq  4. Iron deficiency anemia, unspecified iron deficiency  anemia type Continues on supplement, hgb improved on recent symptoms.   5. Chronic idiopathic constipation Stable on current regimen.   6. Vascular dementia with behavior disturbance (HCC) -Stable, no acute changes in cognitive or functional status, continue supportive care.    Janene Harvey. Biagio Borg [facility] [phone]

## 2022-08-27 DIAGNOSIS — M6281 Muscle weakness (generalized): Secondary | ICD-10-CM | POA: Diagnosis not present

## 2022-08-27 DIAGNOSIS — R278 Other lack of coordination: Secondary | ICD-10-CM | POA: Diagnosis not present

## 2022-08-27 DIAGNOSIS — F0150 Vascular dementia without behavioral disturbance: Secondary | ICD-10-CM | POA: Diagnosis not present

## 2022-08-27 DIAGNOSIS — I69398 Other sequelae of cerebral infarction: Secondary | ICD-10-CM | POA: Diagnosis not present

## 2022-08-27 DIAGNOSIS — Z9181 History of falling: Secondary | ICD-10-CM | POA: Diagnosis not present

## 2022-08-29 DIAGNOSIS — M6281 Muscle weakness (generalized): Secondary | ICD-10-CM | POA: Diagnosis not present

## 2022-08-29 DIAGNOSIS — F0150 Vascular dementia without behavioral disturbance: Secondary | ICD-10-CM | POA: Diagnosis not present

## 2022-08-29 DIAGNOSIS — I69398 Other sequelae of cerebral infarction: Secondary | ICD-10-CM | POA: Diagnosis not present

## 2022-08-29 DIAGNOSIS — R278 Other lack of coordination: Secondary | ICD-10-CM | POA: Diagnosis not present

## 2022-08-29 DIAGNOSIS — Z9181 History of falling: Secondary | ICD-10-CM | POA: Diagnosis not present

## 2022-08-31 DIAGNOSIS — M6281 Muscle weakness (generalized): Secondary | ICD-10-CM | POA: Diagnosis not present

## 2022-08-31 DIAGNOSIS — F0150 Vascular dementia without behavioral disturbance: Secondary | ICD-10-CM | POA: Diagnosis not present

## 2022-08-31 DIAGNOSIS — Z9181 History of falling: Secondary | ICD-10-CM | POA: Diagnosis not present

## 2022-08-31 DIAGNOSIS — I69398 Other sequelae of cerebral infarction: Secondary | ICD-10-CM | POA: Diagnosis not present

## 2022-08-31 DIAGNOSIS — R278 Other lack of coordination: Secondary | ICD-10-CM | POA: Diagnosis not present

## 2022-09-03 DIAGNOSIS — F0150 Vascular dementia without behavioral disturbance: Secondary | ICD-10-CM | POA: Diagnosis not present

## 2022-09-03 DIAGNOSIS — Z9181 History of falling: Secondary | ICD-10-CM | POA: Diagnosis not present

## 2022-09-03 DIAGNOSIS — I69398 Other sequelae of cerebral infarction: Secondary | ICD-10-CM | POA: Diagnosis not present

## 2022-09-03 DIAGNOSIS — R278 Other lack of coordination: Secondary | ICD-10-CM | POA: Diagnosis not present

## 2022-09-03 DIAGNOSIS — M6281 Muscle weakness (generalized): Secondary | ICD-10-CM | POA: Diagnosis not present

## 2022-09-07 DIAGNOSIS — F0150 Vascular dementia without behavioral disturbance: Secondary | ICD-10-CM | POA: Diagnosis not present

## 2022-09-07 DIAGNOSIS — Z9181 History of falling: Secondary | ICD-10-CM | POA: Diagnosis not present

## 2022-09-07 DIAGNOSIS — M6281 Muscle weakness (generalized): Secondary | ICD-10-CM | POA: Diagnosis not present

## 2022-09-07 DIAGNOSIS — R278 Other lack of coordination: Secondary | ICD-10-CM | POA: Diagnosis not present

## 2022-09-07 DIAGNOSIS — I69398 Other sequelae of cerebral infarction: Secondary | ICD-10-CM | POA: Diagnosis not present

## 2022-09-10 DIAGNOSIS — M6281 Muscle weakness (generalized): Secondary | ICD-10-CM | POA: Diagnosis not present

## 2022-09-10 DIAGNOSIS — F0150 Vascular dementia without behavioral disturbance: Secondary | ICD-10-CM | POA: Diagnosis not present

## 2022-09-10 DIAGNOSIS — I69398 Other sequelae of cerebral infarction: Secondary | ICD-10-CM | POA: Diagnosis not present

## 2022-09-10 DIAGNOSIS — Z9181 History of falling: Secondary | ICD-10-CM | POA: Diagnosis not present

## 2022-09-10 DIAGNOSIS — R278 Other lack of coordination: Secondary | ICD-10-CM | POA: Diagnosis not present

## 2022-09-12 DIAGNOSIS — I69398 Other sequelae of cerebral infarction: Secondary | ICD-10-CM | POA: Diagnosis not present

## 2022-09-12 DIAGNOSIS — R278 Other lack of coordination: Secondary | ICD-10-CM | POA: Diagnosis not present

## 2022-09-12 DIAGNOSIS — Z9181 History of falling: Secondary | ICD-10-CM | POA: Diagnosis not present

## 2022-09-12 DIAGNOSIS — F0150 Vascular dementia without behavioral disturbance: Secondary | ICD-10-CM | POA: Diagnosis not present

## 2022-09-12 DIAGNOSIS — M6281 Muscle weakness (generalized): Secondary | ICD-10-CM | POA: Diagnosis not present

## 2022-09-13 DIAGNOSIS — R278 Other lack of coordination: Secondary | ICD-10-CM | POA: Diagnosis not present

## 2022-09-13 DIAGNOSIS — F0150 Vascular dementia without behavioral disturbance: Secondary | ICD-10-CM | POA: Diagnosis not present

## 2022-09-13 DIAGNOSIS — M6281 Muscle weakness (generalized): Secondary | ICD-10-CM | POA: Diagnosis not present

## 2022-09-13 DIAGNOSIS — Z9181 History of falling: Secondary | ICD-10-CM | POA: Diagnosis not present

## 2022-09-13 DIAGNOSIS — I69398 Other sequelae of cerebral infarction: Secondary | ICD-10-CM | POA: Diagnosis not present

## 2022-09-17 DIAGNOSIS — R278 Other lack of coordination: Secondary | ICD-10-CM | POA: Diagnosis not present

## 2022-09-17 DIAGNOSIS — F0150 Vascular dementia without behavioral disturbance: Secondary | ICD-10-CM | POA: Diagnosis not present

## 2022-09-17 DIAGNOSIS — Z9181 History of falling: Secondary | ICD-10-CM | POA: Diagnosis not present

## 2022-09-17 DIAGNOSIS — M6281 Muscle weakness (generalized): Secondary | ICD-10-CM | POA: Diagnosis not present

## 2022-09-17 DIAGNOSIS — I69398 Other sequelae of cerebral infarction: Secondary | ICD-10-CM | POA: Diagnosis not present

## 2022-09-19 DIAGNOSIS — F0150 Vascular dementia without behavioral disturbance: Secondary | ICD-10-CM | POA: Diagnosis not present

## 2022-09-19 DIAGNOSIS — Z9181 History of falling: Secondary | ICD-10-CM | POA: Diagnosis not present

## 2022-09-19 DIAGNOSIS — I69398 Other sequelae of cerebral infarction: Secondary | ICD-10-CM | POA: Diagnosis not present

## 2022-09-19 DIAGNOSIS — R278 Other lack of coordination: Secondary | ICD-10-CM | POA: Diagnosis not present

## 2022-09-19 DIAGNOSIS — M6281 Muscle weakness (generalized): Secondary | ICD-10-CM | POA: Diagnosis not present

## 2022-09-20 ENCOUNTER — Non-Acute Institutional Stay (SKILLED_NURSING_FACILITY): Payer: Medicare Other | Admitting: Nurse Practitioner

## 2022-09-20 ENCOUNTER — Encounter: Payer: Self-pay | Admitting: Nurse Practitioner

## 2022-09-20 DIAGNOSIS — F01518 Vascular dementia, unspecified severity, with other behavioral disturbance: Secondary | ICD-10-CM

## 2022-09-20 DIAGNOSIS — D509 Iron deficiency anemia, unspecified: Secondary | ICD-10-CM

## 2022-09-20 DIAGNOSIS — E43 Unspecified severe protein-calorie malnutrition: Secondary | ICD-10-CM | POA: Diagnosis not present

## 2022-09-20 DIAGNOSIS — I69354 Hemiplegia and hemiparesis following cerebral infarction affecting left non-dominant side: Secondary | ICD-10-CM | POA: Diagnosis not present

## 2022-09-20 DIAGNOSIS — K5904 Chronic idiopathic constipation: Secondary | ICD-10-CM

## 2022-09-20 LAB — BASIC METABOLIC PANEL
BUN: 30 — AB (ref 4–21)
CO2: 25 — AB (ref 13–22)
Chloride: 105 (ref 99–108)
Creatinine: 0.9 (ref 0.5–1.1)
Glucose: 73
Potassium: 4.2 mEq/L (ref 3.5–5.1)
Sodium: 139 (ref 137–147)

## 2022-09-20 LAB — COMPREHENSIVE METABOLIC PANEL
Calcium: 8.9 (ref 8.7–10.7)
eGFR: 62

## 2022-09-20 LAB — CBC AND DIFFERENTIAL
HCT: 33 — AB (ref 36–46)
Hemoglobin: 10.6 — AB (ref 12.0–16.0)
Neutrophils Absolute: 5993
Platelets: 388 10*3/uL (ref 150–400)
WBC: 8.8

## 2022-09-20 LAB — CBC: RBC: 3.23 — AB (ref 3.87–5.11)

## 2022-09-20 NOTE — Progress Notes (Signed)
Location:  [facility].  Nursing Home Room Number: [facility] [address] Place of Service:  SNF [phone]) [name], NP  PCP: Earnestine Mealing, MD  Patient Care Team: Earnestine Mealing, MD as PCP - General [facility] Medicine)  Extended Emergency Contact Information Primary Emergency Contact: Mazer,[name] Address: [address]          [place], [place] [address] [place] of [place] Home Phone: [phone] Relation: Spouse Secondary Emergency Contact: Godino,[name] Mobile Phone: [phone] Relation: Son  Goals of care: Advanced Directive information    09/20/2022    3:09 PM  Advanced Directives  Does Patient Have a Medical Advance Directive? Yes  Type of Advance Directive Out of facility DNR (pink MOST or yellow form)  Does patient want to make changes to medical advance directive? No - Patient declined     Chief Complaint  Patient presents with   Medical Management of Chronic Issues    Medical Management of Chronic Issues.     HPI:  Pt is a 89 y.o. female seen today for medical management of chronic disease. Pt with hx of dementia, anemia, CVA, hyperlipidemia.  Pt has been doing well without any acute concerns from nursing.  Weight has been stable.   Past Medical History:  Diagnosis Date   Arthritis    Chest pain    a. 05/2015 - post-prandial.   GERD (gastroesophageal reflux disease)    a. takes Omeprazole daily, prev seen by D. Brodie.   Heart murmur    a. 05/2013 Echo: EF 55-60%, gr2 DD, mild AI/MR.   History of blood transfusion    no abnormal reaction noted    History of migraine headaches    a. last 10/2013   Iron deficiency anemia    Joint pain    PONV (postoperative nausea and vomiting)    Rectocele 2011   Past Surgical History:  Procedure Laterality Date   cataract surgery  Bilateral    R 03/2001; L 05/2001.   COLONOSCOPY     HYSTEROSCOPY  11/1999   w/ resection of endometrial polyp by uterine curetting.   INTRAMEDULLARY (IM) NAIL INTERTROCHANTERIC  Right 07/15/2020   Procedure: INTRAMEDULLARY (IM) NAIL INTERTROCHANTRIC;  Surgeon: Juanell Fairly, MD;  Location: [facility] ORS;  Service: Orthopedics;  Laterality: Right;   JOINT REPLACEMENT Bilateral R-9/04, L- 8/05   Total Knee Arthroplasties.   KYPHOPLASTY N/A 04/10/2019   Procedure: KYPHOPLASTY;  Surgeon: Kennedy Bucker, MD;  Location: [facility] ORS;  Service: Orthopedics;  Laterality: N/A;   REVERSE SHOULDER ARTHROPLASTY Right 11/26/2013   Procedure: RIGHT REVERSE SHOULDER ARTHROPLASTY;  Surgeon: Senaida Lange, MD;  Location: [facility] OR;  Service: Orthopedics;  Laterality: Right;   SHOULDER ARTHROSCOPY Right    THYROIDECTOMY  at age 14   TONSILLECTOMY      Allergies  Allergen Reactions   Sulfonamide Derivatives Hives   Sulfa Antibiotics Other (See Comments) and Rash    Outpatient Encounter Medications as of 09/20/2022  Medication Sig   acetaminophen (TYLENOL) 500 MG tablet Take 1,000 mg by mouth 3 (three) times daily.   aspirin EC 81 MG tablet Take 81 mg by mouth daily. Swallow whole.   atorvastatin (LIPITOR) 40 MG tablet Take 1 tablet (40 mg total) by mouth daily.   bismuth subsalicylate (PEPTO BISMOL) 262 MG/15ML suspension Take 30 mLs by mouth every 4 (four) hours as needed.   calcium carbonate (OSCAL) 1500 (600 Ca) MG TABS tablet Take 600 mg of elemental calcium by mouth daily.   cholecalciferol (VITAMIN D) 1000  units tablet Take 1,000 Units by mouth daily.   mirabegron ER (MYRBETRIQ) 25 MG TB24 tablet Take 25 mg by mouth daily.   mirtazapine (REMERON) 7.5 MG tablet Take 7.5 mg by mouth at bedtime.   polyethylene glycol (MIRALAX / GLYCOLAX) 17 g packet Take 17 g by mouth every other day.   saccharomyces boulardii (FLORASTOR) 250 MG capsule Take 250 mg by mouth daily.   UNABLE TO FIND Take 4 oz by mouth every evening. Med Name: Medpass   [DISCONTINUED] ferrous sulfate 325 (65 FE) MG tablet Take 1 tablet (325 mg total) by mouth every other day.   No facility-administered encounter medications  on file as of 09/20/2022.    Review of Systems  Unable to perform ROS: Dementia     Immunization History  Administered Date(s) Administered   Covid-19, Mrna,Vaccine(Spikevax)64yrs and older 12/29/2021, 05/29/2022   Influenza-Unspecified 12/05/2021   Pneumococcal Conjugate-13 03/31/2014   Pneumococcal Polysaccharide-23 04/17/2016   Pneumococcal-Unspecified 02/19/2001   Tdap 11/13/2011   Unspecified SARS-COV-2 Vaccination 03/06/2019, 04/03/2019, 01/05/2020, 07/07/2020, 07/18/2021   Zoster Recombinant(Shingrix) 04/30/2008, 06/09/2019, 10/21/2019   Pertinent  Health Maintenance Due  Topic Date Due   DEXA SCAN  Never done   INFLUENZA VACCINE  09/20/2022      06/21/2021    6:56 PM 06/22/2021    8:36 AM 06/22/2021    1:00 PM 06/22/2021    9:40 PM 06/27/2021   11:48 PM  Fall Risk  (RETIRED) Patient Fall Risk Level High fall risk High fall risk High fall risk High fall risk High fall risk   Functional Status Survey:    Vitals:   09/20/22 1503  BP: (!) 106/55  Pulse: 90  Resp: 18  Temp: 98.1 F (36.7 C)  SpO2: 95%  Weight: 116 lb 9.6 oz (52.9 kg)  Height: 5\' 5"  (1.651 m)   Body mass index is 19.4 kg/m. Physical Exam Constitutional:      General: She is not in acute distress.    Appearance: She is well-developed. She is not diaphoretic.  HENT:     Head: Normocephalic and atraumatic.     Mouth/Throat:     Pharynx: No oropharyngeal exudate.  Eyes:     Conjunctiva/sclera: Conjunctivae normal.     Pupils: Pupils are equal, round, and reactive to light.  Cardiovascular:     Rate and Rhythm: Normal rate and regular rhythm.     Heart sounds: Normal heart sounds.  Pulmonary:     Effort: Pulmonary effort is normal.     Breath sounds: Normal breath sounds.  Abdominal:     General: Bowel sounds are normal.     Palpations: Abdomen is soft.  Musculoskeletal:     Cervical back: Normal range of motion and neck supple.     Right lower leg: No edema.     Left lower leg: No edema.   Skin:    General: Skin is warm and dry.  Neurological:     Mental Status: She is alert. Mental status is at baseline.  Psychiatric:        Mood and Affect: Mood normal.     Labs reviewed: Recent Labs    06/22/22 1347 07/02/22 0000 07/12/22 0000 09/20/22 0000  NA 141 144 141 139  K 4.0  --  4.2 4.2  CL 107 108 107 105  CO2 26 28* 26* 25*  GLUCOSE 144*  --   --   --   BUN 23 26* 28* 30*  CREATININE 0.89 0.6 0.7 0.9  CALCIUM 9.0 8.4* 8.5* 8.9   Recent Labs    12/18/21 0000 06/22/22 1347 07/12/22 0000  AST 21 27 23   ALT 17 16 19   ALKPHOS 97 88 83  BILITOT  --  0.7  --   PROT  --  7.0  --   ALBUMIN 3.7 3.3* 3.5   Recent Labs    06/20/22 0000 06/22/22 1347 07/02/22 0000 07/12/22 0000 08/20/22 0000 09/20/22 0000  WBC 5.8 9.2   < > 6.0 11.8 8.8  NEUTROABS 2,384.00 5.8  --   --   --  5,993.00  HGB 9.6* 10.0*   < > 8.8* 9.2* 10.6*  HCT 29* 30.9*   < > 26* 29* 33*  MCV  --  101.0*  --   --   --   --   PLT 313 399   < > 374 330 388   < > = values in this interval not displayed.   No results found for: "TSH" Lab Results  Component Value Date   HGBA1C 5.2 06/22/2022   Lab Results  Component Value Date   CHOL 154 06/23/2022   HDL 67 06/23/2022   LDLCALC 78 06/23/2022   TRIG 45 06/23/2022   CHOLHDL 2.3 06/23/2022    Significant Diagnostic Results in last 30 days:  No results found.  Assessment/Plan 1. Protein-calorie malnutrition, severe (HCC) -continues on remeron to help with appetite. Weight has been stable. Continues on supplement.   2. Vascular dementia with behavior disturbance (HCC) -Stable, no acute changes in cognitive or functional status, continue supportive care.   3. Iron deficiency anemia, unspecified iron deficiency anemia type -off iron at this time. Will continue to follow hgb  4. Hemiparesis affecting left side as late effect of stroke (HCC) -stable, continues on ASA daily  5. Chronic idiopathic constipation Controlled on current  regimen.   Janene Harvey. Biagio Borg [facility] [phone]

## 2022-09-22 DIAGNOSIS — M48061 Spinal stenosis, lumbar region without neurogenic claudication: Secondary | ICD-10-CM | POA: Diagnosis not present

## 2022-09-22 DIAGNOSIS — F0150 Vascular dementia without behavioral disturbance: Secondary | ICD-10-CM | POA: Diagnosis not present

## 2022-09-22 DIAGNOSIS — I69398 Other sequelae of cerebral infarction: Secondary | ICD-10-CM | POA: Diagnosis not present

## 2022-09-22 DIAGNOSIS — R278 Other lack of coordination: Secondary | ICD-10-CM | POA: Diagnosis not present

## 2022-09-22 DIAGNOSIS — M6281 Muscle weakness (generalized): Secondary | ICD-10-CM | POA: Diagnosis not present

## 2022-09-22 DIAGNOSIS — Z9181 History of falling: Secondary | ICD-10-CM | POA: Diagnosis not present

## 2022-09-24 DIAGNOSIS — M48061 Spinal stenosis, lumbar region without neurogenic claudication: Secondary | ICD-10-CM | POA: Diagnosis not present

## 2022-09-24 DIAGNOSIS — F0150 Vascular dementia without behavioral disturbance: Secondary | ICD-10-CM | POA: Diagnosis not present

## 2022-09-24 DIAGNOSIS — R278 Other lack of coordination: Secondary | ICD-10-CM | POA: Diagnosis not present

## 2022-09-24 DIAGNOSIS — M6281 Muscle weakness (generalized): Secondary | ICD-10-CM | POA: Diagnosis not present

## 2022-09-24 DIAGNOSIS — I69398 Other sequelae of cerebral infarction: Secondary | ICD-10-CM | POA: Diagnosis not present

## 2022-09-24 DIAGNOSIS — Z9181 History of falling: Secondary | ICD-10-CM | POA: Diagnosis not present

## 2022-09-25 ENCOUNTER — Encounter: Payer: Self-pay | Admitting: Nurse Practitioner

## 2022-09-25 ENCOUNTER — Non-Acute Institutional Stay (INDEPENDENT_AMBULATORY_CARE_PROVIDER_SITE_OTHER): Payer: Medicare Other | Admitting: Nurse Practitioner

## 2022-09-25 DIAGNOSIS — Z0000 Encounter for general adult medical examination without abnormal findings: Secondary | ICD-10-CM | POA: Diagnosis not present

## 2022-09-25 NOTE — Patient Instructions (Signed)
  Ms. Leibfried , Thank you for taking time to come for your Medicare Wellness Visit. I appreciate your ongoing commitment to your health goals. Please review the following plan we discussed and let me know if I can assist you in the future.   These are the goals we discussed:  Goals   None     This is a list of the screening recommended for you and due dates:  Health Maintenance  Topic Date Due   DTaP/Tdap/Td vaccine (2 - Td or Tdap) 11/12/2021   COVID-19 Vaccine (8 - 2023-24 season) 07/24/2022   Flu Shot  09/20/2022   Medicare Annual Wellness Visit  09/25/2023   Pneumonia Vaccine  Completed   Zoster (Shingles) Vaccine  Completed   HPV Vaccine  Aged Out   DEXA scan (bone density measurement)  Discontinued   Due for DTAP

## 2022-09-25 NOTE — Progress Notes (Signed)
Subjective:   Melissa [name] is a 89 y.o. female who presents for Medicare Annual (Subsequent) preventive examination.  Visit Complete: In person at [facility]  Review of Systems     Cardiac Risk Factors include: advanced age (>48men, >27 women);sedentary lifestyle     Objective:    Today's Vitals   09/25/22 1547  BP: (!) 106/55  Pulse: 90  Resp: 18  Temp: 98.1 F (36.7 C)  SpO2: 95%  Weight: 116 lb 9.6 oz (52.9 kg)  Height: 5\' 5"  (1.651 m)  PainSc: 0-No pain   Body mass index is 19.4 kg/m.     09/25/2022    4:16 PM 09/20/2022    3:09 PM 08/21/2022   11:47 AM 07/25/2022   10:43 AM 06/27/2022   10:54 AM 06/22/2022   11:00 PM 06/22/2022    1:36 PM  Advanced Directives  Does Patient Have a Medical Advance Directive? Yes Yes Yes Yes Yes No Yes  Type of Advance Directive Out of facility DNR (pink MOST or yellow form) Out of facility DNR (pink MOST or yellow form) Out of facility DNR (pink MOST or yellow form) Out of facility DNR (pink MOST or yellow form) Out of facility DNR (pink MOST or yellow form) Out of facility DNR (pink MOST or yellow form) Out of facility DNR (pink MOST or yellow form)  Does patient want to make changes to medical advance directive? No - Patient declined No - Patient declined No - Patient declined No - Patient declined No - Patient declined    Would patient like information on creating a medical advance directive?      No - Patient declined   Pre-existing out of facility DNR order (yellow form or pink MOST form)      Yellow form placed in chart (order not valid for inpatient use)     Current Medications (verified) Outpatient Encounter Medications as of 09/25/2022  Medication Sig   acetaminophen (TYLENOL) 500 MG tablet Take 1,000 mg by mouth 3 (three) times daily.   aspirin EC 81 MG tablet Take 81 mg by mouth daily. Swallow whole.   atorvastatin (LIPITOR) 40 MG tablet Take 1 tablet (40 mg total) by mouth daily.   bismuth subsalicylate (PEPTO BISMOL) 262  MG/15ML suspension Take 30 mLs by mouth every 4 (four) hours as needed.   calcium carbonate (OSCAL) 1500 (600 Ca) MG TABS tablet Take 600 mg of elemental calcium by mouth daily.   cholecalciferol (VITAMIN D) 1000 units tablet Take 1,000 Units by mouth daily.   mirabegron ER (MYRBETRIQ) 25 MG TB24 tablet Take 25 mg by mouth daily.   mirtazapine (REMERON) 7.5 MG tablet Take 7.5 mg by mouth at bedtime.   polyethylene glycol (MIRALAX / GLYCOLAX) 17 g packet Take 17 g by mouth every other day.   saccharomyces boulardii (FLORASTOR) 250 MG capsule Take 250 mg by mouth daily.   UNABLE TO FIND Take 4 oz by mouth every evening. Med Name: Medpass   No facility-administered encounter medications on file as of 09/25/2022.    Allergies (verified) Sulfonamide derivatives and Sulfa antibiotics   History: Past Medical History:  Diagnosis Date   Arthritis    Chest pain    a. 05/2015 - post-prandial.   GERD (gastroesophageal reflux disease)    a. takes Omeprazole daily, prev seen by D. Brodie.   Heart murmur    a. 05/2013 Echo: EF 55-60%, gr2 DD, mild AI/MR.   History of blood transfusion    no abnormal  reaction noted    History of migraine headaches    a. last 10/2013   Iron deficiency anemia    Joint pain    PONV (postoperative nausea and vomiting)    Rectocele 2011   Past Surgical History:  Procedure Laterality Date   cataract surgery  Bilateral    R 03/2001; L 05/2001.   COLONOSCOPY     HYSTEROSCOPY  11/1999   w/ resection of endometrial polyp by uterine curetting.   INTRAMEDULLARY (IM) NAIL INTERTROCHANTERIC Right 07/15/2020   Procedure: INTRAMEDULLARY (IM) NAIL INTERTROCHANTRIC;  Surgeon: Juanell Fairly, MD;  Location: [facility] ORS;  Service: Orthopedics;  Laterality: Right;   JOINT REPLACEMENT Bilateral R-9/04, L- 8/05   Total Knee Arthroplasties.   KYPHOPLASTY N/A 04/10/2019   Procedure: KYPHOPLASTY;  Surgeon: Kennedy Bucker, MD;  Location: [facility] ORS;  Service: Orthopedics;  Laterality: N/A;    REVERSE SHOULDER ARTHROPLASTY Right 11/26/2013   Procedure: RIGHT REVERSE SHOULDER ARTHROPLASTY;  Surgeon: Senaida Lange, MD;  Location: [facility];  Service: Orthopedics;  Laterality: Right;   SHOULDER ARTHROSCOPY Right    THYROIDECTOMY  at age 3   TONSILLECTOMY     Family History  Problem Relation Age of Onset   Heart attack Mother        died @ 57   Heart attack Brother        died @ 66   Heart attack Brother        died @ 53   Other Father        died suddenly @ 60.   Social History   Socioeconomic History   Marital status: Married    Spouse name: Not on file   Number of children: Not on file   Years of education: Not on file   Highest education level: Not on file  Occupational History   Not on file  Tobacco Use   Smoking status: Never   Smokeless tobacco: Never  Vaping Use   Vaping status: Never Used  Substance and Sexual Activity   Alcohol use: No   Drug use: No   Sexual activity: Never    Birth control/protection: Post-menopausal  Other Topics Concern   Not on file  Social History Narrative   Retired.  Very active.  Lives at [facility] with 41 y/o husband.  Walks one mile daily (~ 40 mins) and partakes in H2O aerobics twice/wk.  Enjoys gardening.   Social Determinants of Health   Financial Resource Strain: Not on file  Food Insecurity: No Food Insecurity (06/23/2022)   Hunger Vital Sign    Worried About Running Out of Food in the Last Year: Never true    Ran Out of Food in the Last Year: Never true  Transportation Needs: No Transportation Needs (06/23/2022)   PRAPARE - Administrator, Civil Service (Medical): No    Lack of Transportation (Non-Medical): No  Physical Activity: Not on file  Stress: Not on file  Social Connections: Not on file    Tobacco Counseling Counseling given: Not Answered   Clinical Intake:  Pre-visit preparation completed: Yes  Pain : No/denies pain Pain Score: 0-No pain     BMI - recorded: 19 Nutritional Status:  BMI of 19-24  Normal Nutritional Risks: None Diabetes: No  How often do you need to have someone help you when you read instructions, pamphlets, or other written materials from your doctor or pharmacy?: 5 - Always         Activities of Daily Living  09/25/2022    3:46 PM 06/22/2022   11:00 PM  In your present state of health, do you have any difficulty performing the following activities:  Hearing? 1 0  Vision? 0 1  Difficulty concentrating or making decisions? 1 1  Walking or climbing stairs? 1 1  Dressing or bathing? 1 1  Doing errands, shopping? 1 0  Preparing Food and eating ? Y   Using the Toilet? Y   In the past six months, have you accidently leaked urine? Y   Do you have problems with loss of bowel control? Y   Managing your Medications? Y   Managing your Finances? Y   Housekeeping or managing your Housekeeping? Y     Patient Care Team: Earnestine Mealing, MD as PCP - General (Family Medicine)  Indicate any recent Medical Services you may have received from other than [facility] providers in the past year (date may be approximate).     Assessment:   This is a routine wellness examination for Mily.  Hearing/Vision screen No results found.  Dietary issues and exercise activities discussed:     Goals Addressed   None    Depression Screen     No data to display          Fall Risk    09/25/2022    3:48 PM  Fall Risk   Falls in the past year? 1  Number falls in past yr: 1  Injury with Fall? 0  Risk for fall due to : History of fall(s);Impaired balance/gait;Impaired mobility    MEDICARE RISK AT HOME:   TIMED UP AND GO:  Was the test performed?  No    Cognitive Function:        Immunizations Immunization History  Administered Date(s) Administered   Covid-19, Mrna,Vaccine(Spikevax)62yrs and older 12/29/2021, 05/29/2022   Influenza-Unspecified 12/05/2021   Pneumococcal Conjugate-13 03/31/2014   Pneumococcal Polysaccharide-23 04/17/2016    Pneumococcal-Unspecified 02/19/2001   Tdap 11/13/2011   Unspecified SARS-COV-2 Vaccination 03/06/2019, 04/03/2019, 01/05/2020, 07/07/2020, 07/18/2021   Zoster Recombinant(Shingrix) 04/30/2008, 06/09/2019, 10/21/2019    TDAP status: Due, Education has been provided regarding the importance of this vaccine. Advised may receive this vaccine at local pharmacy or Health Dept. Aware to provide a copy of the vaccination record if obtained from local pharmacy or Health Dept. Verbalized acceptance and understanding.  Flu Vaccine status: Due, Education has been provided regarding the importance of this vaccine. Advised may receive this vaccine at local pharmacy or Health Dept. Aware to provide a copy of the vaccination record if obtained from local pharmacy or Health Dept. Verbalized acceptance and understanding.  Pneumococcal vaccine status: Up to date  Covid-19 vaccine status: Information provided on how to obtain vaccines.   Qualifies for Shingles Vaccine? Yes   Zostavax completed Yes   Shingrix Completed?: Yes  Screening Tests Health Maintenance  Topic Date Due   DTaP/Tdap/Td (2 - Td or Tdap) 11/12/2021   COVID-19 Vaccine (8 - 2023-24 season) 07/24/2022   INFLUENZA VACCINE  09/20/2022   Medicare Annual Wellness (AWV)  09/25/2023   Pneumonia Vaccine 47+ Years old  Completed   Zoster Vaccines- Shingrix  Completed   HPV VACCINES  Aged Out   DEXA SCAN  Discontinued    Health Maintenance  Health Maintenance Due  Topic Date Due   DTaP/Tdap/Td (2 - Td or Tdap) 11/12/2021   COVID-19 Vaccine (8 - 2023-24 season) 07/24/2022   INFLUENZA VACCINE  09/20/2022    Colorectal cancer screening: No longer required.   Mammogram  status: No longer required due to age.  Not candidate for bone density due to immobility   Lung Cancer Screening: (Low Dose CT Chest recommended if Age 21-80 years, 20 pack-year currently smoking OR have quit w/in 15years.) does not qualify.   Lung Cancer Screening  Referral: na  Additional Screening:  Hepatitis C Screening: does not qualify  Vision Screening: Recommended annual ophthalmology exams for early detection of glaucoma and other disorders of the eye. Is the patient up to date with their annual eye exam?  No  Unable to complete due to bone density   Dental Screening: Recommended annual dental exams for proper oral hygiene    Community Resource Referral / Chronic Care Management: CRR required this visit?  No   CCM required this visit?  No     Plan:     I have personally reviewed and noted the following in the patient's chart:   Medical and social history Use of alcohol, tobacco or illicit drugs  Current medications and supplements including opioid prescriptions. Patient is not currently taking opioid prescriptions. Functional ability and status Nutritional status Physical activity Advanced directives List of other physicians Hospitalizations, surgeries, and ER visits in previous 12 months Vitals Screenings to include cognitive, depression, and falls Referrals and appointments  In addition, I have reviewed and discussed with patient certain preventive protocols, quality metrics, and best practice recommendations. A written personalized care plan for preventive services as well as general preventive health recommendations were provided to patient.     Sharon Seller, NP   09/25/2022

## 2022-09-26 DIAGNOSIS — R278 Other lack of coordination: Secondary | ICD-10-CM | POA: Diagnosis not present

## 2022-09-26 DIAGNOSIS — M6281 Muscle weakness (generalized): Secondary | ICD-10-CM | POA: Diagnosis not present

## 2022-09-26 DIAGNOSIS — I69398 Other sequelae of cerebral infarction: Secondary | ICD-10-CM | POA: Diagnosis not present

## 2022-09-26 DIAGNOSIS — M48061 Spinal stenosis, lumbar region without neurogenic claudication: Secondary | ICD-10-CM | POA: Diagnosis not present

## 2022-09-26 DIAGNOSIS — Z9181 History of falling: Secondary | ICD-10-CM | POA: Diagnosis not present

## 2022-09-26 DIAGNOSIS — F0150 Vascular dementia without behavioral disturbance: Secondary | ICD-10-CM | POA: Diagnosis not present

## 2022-09-28 DIAGNOSIS — I69398 Other sequelae of cerebral infarction: Secondary | ICD-10-CM | POA: Diagnosis not present

## 2022-09-28 DIAGNOSIS — M6281 Muscle weakness (generalized): Secondary | ICD-10-CM | POA: Diagnosis not present

## 2022-09-28 DIAGNOSIS — Z9181 History of falling: Secondary | ICD-10-CM | POA: Diagnosis not present

## 2022-09-28 DIAGNOSIS — R278 Other lack of coordination: Secondary | ICD-10-CM | POA: Diagnosis not present

## 2022-09-28 DIAGNOSIS — F0150 Vascular dementia without behavioral disturbance: Secondary | ICD-10-CM | POA: Diagnosis not present

## 2022-09-28 DIAGNOSIS — M48061 Spinal stenosis, lumbar region without neurogenic claudication: Secondary | ICD-10-CM | POA: Diagnosis not present

## 2022-10-03 DIAGNOSIS — R278 Other lack of coordination: Secondary | ICD-10-CM | POA: Diagnosis not present

## 2022-10-03 DIAGNOSIS — M6281 Muscle weakness (generalized): Secondary | ICD-10-CM | POA: Diagnosis not present

## 2022-10-03 DIAGNOSIS — F0150 Vascular dementia without behavioral disturbance: Secondary | ICD-10-CM | POA: Diagnosis not present

## 2022-10-03 DIAGNOSIS — M48061 Spinal stenosis, lumbar region without neurogenic claudication: Secondary | ICD-10-CM | POA: Diagnosis not present

## 2022-10-03 DIAGNOSIS — I69398 Other sequelae of cerebral infarction: Secondary | ICD-10-CM | POA: Diagnosis not present

## 2022-10-03 DIAGNOSIS — Z9181 History of falling: Secondary | ICD-10-CM | POA: Diagnosis not present

## 2022-10-06 DIAGNOSIS — M6281 Muscle weakness (generalized): Secondary | ICD-10-CM | POA: Diagnosis not present

## 2022-10-06 DIAGNOSIS — F0150 Vascular dementia without behavioral disturbance: Secondary | ICD-10-CM | POA: Diagnosis not present

## 2022-10-06 DIAGNOSIS — I69398 Other sequelae of cerebral infarction: Secondary | ICD-10-CM | POA: Diagnosis not present

## 2022-10-06 DIAGNOSIS — R278 Other lack of coordination: Secondary | ICD-10-CM | POA: Diagnosis not present

## 2022-10-06 DIAGNOSIS — M48061 Spinal stenosis, lumbar region without neurogenic claudication: Secondary | ICD-10-CM | POA: Diagnosis not present

## 2022-10-06 DIAGNOSIS — Z9181 History of falling: Secondary | ICD-10-CM | POA: Diagnosis not present

## 2022-10-09 DIAGNOSIS — F0150 Vascular dementia without behavioral disturbance: Secondary | ICD-10-CM | POA: Diagnosis not present

## 2022-10-09 DIAGNOSIS — Z9181 History of falling: Secondary | ICD-10-CM | POA: Diagnosis not present

## 2022-10-09 DIAGNOSIS — M6281 Muscle weakness (generalized): Secondary | ICD-10-CM | POA: Diagnosis not present

## 2022-10-09 DIAGNOSIS — R278 Other lack of coordination: Secondary | ICD-10-CM | POA: Diagnosis not present

## 2022-10-09 DIAGNOSIS — M48061 Spinal stenosis, lumbar region without neurogenic claudication: Secondary | ICD-10-CM | POA: Diagnosis not present

## 2022-10-09 DIAGNOSIS — I69398 Other sequelae of cerebral infarction: Secondary | ICD-10-CM | POA: Diagnosis not present

## 2022-10-10 DIAGNOSIS — Z9181 History of falling: Secondary | ICD-10-CM | POA: Diagnosis not present

## 2022-10-10 DIAGNOSIS — M48061 Spinal stenosis, lumbar region without neurogenic claudication: Secondary | ICD-10-CM | POA: Diagnosis not present

## 2022-10-10 DIAGNOSIS — F0150 Vascular dementia without behavioral disturbance: Secondary | ICD-10-CM | POA: Diagnosis not present

## 2022-10-10 DIAGNOSIS — M6281 Muscle weakness (generalized): Secondary | ICD-10-CM | POA: Diagnosis not present

## 2022-10-10 DIAGNOSIS — I69398 Other sequelae of cerebral infarction: Secondary | ICD-10-CM | POA: Diagnosis not present

## 2022-10-10 DIAGNOSIS — R278 Other lack of coordination: Secondary | ICD-10-CM | POA: Diagnosis not present

## 2022-10-11 DIAGNOSIS — R278 Other lack of coordination: Secondary | ICD-10-CM | POA: Diagnosis not present

## 2022-10-11 DIAGNOSIS — I69398 Other sequelae of cerebral infarction: Secondary | ICD-10-CM | POA: Diagnosis not present

## 2022-10-11 DIAGNOSIS — F0150 Vascular dementia without behavioral disturbance: Secondary | ICD-10-CM | POA: Diagnosis not present

## 2022-10-11 DIAGNOSIS — Z9181 History of falling: Secondary | ICD-10-CM | POA: Diagnosis not present

## 2022-10-11 DIAGNOSIS — M6281 Muscle weakness (generalized): Secondary | ICD-10-CM | POA: Diagnosis not present

## 2022-10-11 DIAGNOSIS — M48061 Spinal stenosis, lumbar region without neurogenic claudication: Secondary | ICD-10-CM | POA: Diagnosis not present

## 2022-10-12 DIAGNOSIS — I69398 Other sequelae of cerebral infarction: Secondary | ICD-10-CM | POA: Diagnosis not present

## 2022-10-12 DIAGNOSIS — M6281 Muscle weakness (generalized): Secondary | ICD-10-CM | POA: Diagnosis not present

## 2022-10-12 DIAGNOSIS — Z9181 History of falling: Secondary | ICD-10-CM | POA: Diagnosis not present

## 2022-10-12 DIAGNOSIS — F0150 Vascular dementia without behavioral disturbance: Secondary | ICD-10-CM | POA: Diagnosis not present

## 2022-10-12 DIAGNOSIS — M48061 Spinal stenosis, lumbar region without neurogenic claudication: Secondary | ICD-10-CM | POA: Diagnosis not present

## 2022-10-12 DIAGNOSIS — R278 Other lack of coordination: Secondary | ICD-10-CM | POA: Diagnosis not present

## 2022-10-15 DIAGNOSIS — I69398 Other sequelae of cerebral infarction: Secondary | ICD-10-CM | POA: Diagnosis not present

## 2022-10-15 DIAGNOSIS — B351 Tinea unguium: Secondary | ICD-10-CM | POA: Diagnosis not present

## 2022-10-15 DIAGNOSIS — I7091 Generalized atherosclerosis: Secondary | ICD-10-CM | POA: Diagnosis not present

## 2022-10-15 DIAGNOSIS — R278 Other lack of coordination: Secondary | ICD-10-CM | POA: Diagnosis not present

## 2022-10-15 DIAGNOSIS — Z9181 History of falling: Secondary | ICD-10-CM | POA: Diagnosis not present

## 2022-10-15 DIAGNOSIS — F0150 Vascular dementia without behavioral disturbance: Secondary | ICD-10-CM | POA: Diagnosis not present

## 2022-10-15 DIAGNOSIS — M48061 Spinal stenosis, lumbar region without neurogenic claudication: Secondary | ICD-10-CM | POA: Diagnosis not present

## 2022-10-15 DIAGNOSIS — M6281 Muscle weakness (generalized): Secondary | ICD-10-CM | POA: Diagnosis not present

## 2022-10-16 DIAGNOSIS — I69398 Other sequelae of cerebral infarction: Secondary | ICD-10-CM | POA: Diagnosis not present

## 2022-10-16 DIAGNOSIS — F0150 Vascular dementia without behavioral disturbance: Secondary | ICD-10-CM | POA: Diagnosis not present

## 2022-10-16 DIAGNOSIS — M6281 Muscle weakness (generalized): Secondary | ICD-10-CM | POA: Diagnosis not present

## 2022-10-16 DIAGNOSIS — R278 Other lack of coordination: Secondary | ICD-10-CM | POA: Diagnosis not present

## 2022-10-16 DIAGNOSIS — M48061 Spinal stenosis, lumbar region without neurogenic claudication: Secondary | ICD-10-CM | POA: Diagnosis not present

## 2022-10-16 DIAGNOSIS — Z9181 History of falling: Secondary | ICD-10-CM | POA: Diagnosis not present

## 2022-10-17 DIAGNOSIS — I69398 Other sequelae of cerebral infarction: Secondary | ICD-10-CM | POA: Diagnosis not present

## 2022-10-17 DIAGNOSIS — M6281 Muscle weakness (generalized): Secondary | ICD-10-CM | POA: Diagnosis not present

## 2022-10-17 DIAGNOSIS — F0150 Vascular dementia without behavioral disturbance: Secondary | ICD-10-CM | POA: Diagnosis not present

## 2022-10-17 DIAGNOSIS — M48061 Spinal stenosis, lumbar region without neurogenic claudication: Secondary | ICD-10-CM | POA: Diagnosis not present

## 2022-10-17 DIAGNOSIS — Z9181 History of falling: Secondary | ICD-10-CM | POA: Diagnosis not present

## 2022-10-17 DIAGNOSIS — R278 Other lack of coordination: Secondary | ICD-10-CM | POA: Diagnosis not present

## 2022-10-18 ENCOUNTER — Non-Acute Institutional Stay (SKILLED_NURSING_FACILITY): Payer: Medicare Other | Admitting: Nurse Practitioner

## 2022-10-18 ENCOUNTER — Encounter: Payer: Self-pay | Admitting: Nurse Practitioner

## 2022-10-18 DIAGNOSIS — K5904 Chronic idiopathic constipation: Secondary | ICD-10-CM | POA: Diagnosis not present

## 2022-10-18 DIAGNOSIS — R278 Other lack of coordination: Secondary | ICD-10-CM | POA: Diagnosis not present

## 2022-10-18 DIAGNOSIS — F0150 Vascular dementia without behavioral disturbance: Secondary | ICD-10-CM | POA: Diagnosis not present

## 2022-10-18 DIAGNOSIS — M48061 Spinal stenosis, lumbar region without neurogenic claudication: Secondary | ICD-10-CM | POA: Diagnosis not present

## 2022-10-18 DIAGNOSIS — M6281 Muscle weakness (generalized): Secondary | ICD-10-CM | POA: Diagnosis not present

## 2022-10-18 DIAGNOSIS — I69354 Hemiplegia and hemiparesis following cerebral infarction affecting left non-dominant side: Secondary | ICD-10-CM

## 2022-10-18 DIAGNOSIS — I69398 Other sequelae of cerebral infarction: Secondary | ICD-10-CM | POA: Diagnosis not present

## 2022-10-18 DIAGNOSIS — Z9181 History of falling: Secondary | ICD-10-CM | POA: Diagnosis not present

## 2022-10-18 NOTE — Progress Notes (Signed)
Location:  Other [facility] Room Number: [facility] [address] Place of Service:  SNF [phone]) Melissa Chatters, NP  YNW:[id number], Benetta Spar, MD  Patient Care Team: Earnestine Mealing, MD as PCP - General [facility])  Extended Emergency Contact Information Primary Emergency Contact: [name],[name] Address: [address]          [place], [place] [address] [place] Home Phone: [phone] Relation: Spouse Secondary Emergency Contact: [name],[name] Mobile Phone: [phone] Relation: Son  Goals of care: Advanced Directive information    10/18/2022    2:44 PM  Advanced Directives  Does Patient Have a Medical Advance Directive? Yes  Type of Advance Directive Out of facility DNR (pink MOST or yellow form)  Does patient want to make changes to medical advance directive? No - Patient declined     Chief Complaint  Patient presents with   Acute Visit    Decrease in Function.     HPI:  Pt is a 89 y.o. female seen today for an acute visit for decrease in function.  Pt had CVA in May.  She was seen by therapy and returned to baseline however over the past few days she has needed increase in assistance with ADLs, needing to be feed, increase in weakness.  Not following directions as well.  She eats well with assistance from staff No trouble chewing or swallowing No reports of pain  She is sneezing today but staff reports this is baseline.  No cough, congestion or fever reported.    Past Medical History:  Diagnosis Date   Arthritis    Chest pain    a. 05/2015 - post-prandial.   GERD (gastroesophageal reflux disease)    a. takes Omeprazole daily, prev seen by D. Brodie.   Heart murmur    a. 05/2013 Echo: EF 55-60%, gr2 DD, mild AI/MR.   History of blood transfusion    no abnormal reaction noted    History of migraine headaches    a. last 10/2013   Iron deficiency anemia    Joint pain    PONV (postoperative nausea and vomiting)    Rectocele 2011   Past  Surgical History:  Procedure Laterality Date   cataract surgery  Bilateral    R 03/2001; L 05/2001.   COLONOSCOPY     HYSTEROSCOPY  11/1999   w/ resection of endometrial polyp by uterine curetting.   INTRAMEDULLARY (IM) NAIL INTERTROCHANTERIC Right 07/15/2020   Procedure: INTRAMEDULLARY (IM) NAIL INTERTROCHANTRIC;  Surgeon: Juanell Fairly, MD;  Location: [facility] ORS;  Service: Orthopedics;  Laterality: Right;   JOINT REPLACEMENT Bilateral R-9/04, L- 8/05   Total Knee Arthroplasties.   KYPHOPLASTY N/A 04/10/2019   Procedure: KYPHOPLASTY;  Surgeon: Kennedy Bucker, MD;  Location: [facility] ORS;  Service: Orthopedics;  Laterality: N/A;   REVERSE SHOULDER ARTHROPLASTY Right 11/26/2013   Procedure: RIGHT REVERSE SHOULDER ARTHROPLASTY;  Surgeon: Senaida Lange, MD;  Location: [facility];  Service: Orthopedics;  Laterality: Right;   SHOULDER ARTHROSCOPY Right    THYROIDECTOMY  at age 40   TONSILLECTOMY      Allergies  Allergen Reactions   Sulfonamide Derivatives Hives   Sulfa Antibiotics Other (See Comments) and Rash    Outpatient Encounter Medications as of 10/18/2022  Medication Sig   acetaminophen (TYLENOL) 500 MG tablet Take 1,000 mg by mouth 3 (three) times daily.   aspirin EC 81 MG tablet Take 81 mg by mouth daily. Swallow whole.   atorvastatin (LIPITOR) 40 MG tablet Take 1 tablet (40 mg total)  by mouth daily.   bismuth subsalicylate (PEPTO BISMOL) 262 MG/15ML suspension Take 30 mLs by mouth every 4 (four) hours as needed.   calcium carbonate (OSCAL) 1500 (600 Ca) MG TABS tablet Take 600 mg of elemental calcium by mouth daily.   cholecalciferol (VITAMIN D) 1000 units tablet Take 1,000 Units by mouth daily.   mirabegron ER (MYRBETRIQ) 25 MG TB24 tablet Take 25 mg by mouth daily.   mirtazapine (REMERON) 7.5 MG tablet Take 7.5 mg by mouth at bedtime.   polyethylene glycol (MIRALAX / GLYCOLAX) 17 g packet Take 17 g by mouth every other day.   saccharomyces boulardii (FLORASTOR) 250 MG capsule Take 250  mg by mouth daily.   UNABLE TO FIND Take 4 oz by mouth every evening. Med Name: Medpass   No facility-administered encounter medications on file as of 10/18/2022.    Review of Systems  Unable to perform ROS: Dementia    Immunization History  Administered Date(s) Administered   Covid-19, Mrna,Vaccine(Spikevax)36yrs and older 12/29/2021, 05/29/2022   Influenza-Unspecified 12/05/2021   Pneumococcal Conjugate-13 03/31/2014   Pneumococcal Polysaccharide-23 04/17/2016   Pneumococcal-Unspecified 02/19/2001   Tdap 11/13/2011   Unspecified SARS-COV-2 Vaccination 03/06/2019, 04/03/2019, 01/05/2020, 07/07/2020, 07/18/2021, 12/29/2021   Zoster Recombinant(Shingrix) 04/30/2008, 06/09/2019, 10/21/2019   Zoster, Live 04/30/2008   Pertinent  Health Maintenance Due  Topic Date Due   INFLUENZA VACCINE  09/20/2022   DEXA SCAN  Discontinued      06/22/2021    8:36 AM 06/22/2021    1:00 PM 06/22/2021    9:40 PM 06/27/2021   11:48 PM 09/25/2022    3:48 PM  Fall Risk  Falls in the past year?     1  Was there an injury with Fall?     0  Fall Risk Category Calculator     2  (RETIRED) Patient Fall Risk Level High fall risk High fall risk High fall risk High fall risk   Patient at Risk for Falls Due to     History of fall(s);Impaired balance/gait;Impaired mobility   Functional Status Survey:    Vitals:   10/18/22 1431  BP: 122/77  Pulse: 86  Resp: 16  Temp: (!) 97.4 F (36.3 C)  SpO2: 94%  Weight: 116 lb 3.2 oz (52.7 kg)  Height: 5\' 5"  (1.651 m)   Body mass index is 19.34 kg/m. Physical Exam Constitutional:      General: She is not in acute distress.    Appearance: She is well-developed. She is not diaphoretic.  HENT:     Head: Normocephalic and atraumatic.     Mouth/Throat:     Pharynx: No oropharyngeal exudate.  Eyes:     Conjunctiva/sclera: Conjunctivae normal.     Pupils: Pupils are equal, round, and reactive to light.  Cardiovascular:     Rate and Rhythm: Normal rate and regular  rhythm.     Heart sounds: Normal heart sounds.  Pulmonary:     Effort: Pulmonary effort is normal.     Breath sounds: Normal breath sounds.  Abdominal:     General: Bowel sounds are normal.     Palpations: Abdomen is soft.  Musculoskeletal:     Cervical back: Normal range of motion and neck supple.     Right lower leg: No edema.     Left lower leg: No edema.  Skin:    General: Skin is warm and dry.  Neurological:     Mental Status: She is alert. She is disoriented.     Cranial Nerves:  Cranial nerve deficit present.     Motor: Weakness present.     Gait: Gait abnormal.  Psychiatric:        Mood and Affect: Mood normal.     Labs reviewed: Recent Labs    06/22/22 1347 07/02/22 0000 07/12/22 0000 09/20/22 0000  NA 141 144 141 139  K 4.0  --  4.2 4.2  CL 107 108 107 105  CO2 26 28* 26* 25*  GLUCOSE 144*  --   --   --   BUN 23 26* 28* 30*  CREATININE 0.89 0.6 0.7 0.9  CALCIUM 9.0 8.4* 8.5* 8.9   Recent Labs    12/18/21 0000 06/22/22 1347 07/12/22 0000  AST 21 27 23   ALT 17 16 19   ALKPHOS 97 88 83  BILITOT  --  0.7  --   PROT  --  7.0  --   ALBUMIN 3.7 3.3* 3.5   Recent Labs    06/20/22 0000 06/22/22 1347 07/02/22 0000 07/12/22 0000 08/20/22 0000 09/20/22 0000  WBC 5.8 9.2   < > 6.0 11.8 8.8  NEUTROABS 2,384.00 5.8  --   --   --  5,993.00  HGB 9.6* 10.0*   < > 8.8* 9.2* 10.6*  HCT 29* 30.9*   < > 26* 29* 33*  MCV  --  101.0*  --   --   --   --   PLT 313 399   < > 374 330 388   < > = values in this interval not displayed.   No results found for: "TSH" Lab Results  Component Value Date   HGBA1C 5.2 06/22/2022   Lab Results  Component Value Date   CHOL 154 06/23/2022   HDL 67 06/23/2022   LDLCALC 78 06/23/2022   TRIG 45 06/23/2022   CHOLHDL 2.3 06/23/2022    Significant Diagnostic Results in last 30 days:  No results found.  Assessment/Plan 1. Hemiparesis affecting left side as late effect of stroke (HCC) ADL previously improved but now  needing more cueing and assistance with care.  Could be progression in dementia and late effect from CVA Will get labs to rule out infection or electrolyte disturbance  2. Chronic idiopathic constipation Well controlled on current regimen.    Melissa [name]. Biagio Borg [facility] [phone]

## 2022-10-19 DIAGNOSIS — I69398 Other sequelae of cerebral infarction: Secondary | ICD-10-CM | POA: Diagnosis not present

## 2022-10-19 DIAGNOSIS — F0150 Vascular dementia without behavioral disturbance: Secondary | ICD-10-CM | POA: Diagnosis not present

## 2022-10-19 DIAGNOSIS — M48061 Spinal stenosis, lumbar region without neurogenic claudication: Secondary | ICD-10-CM | POA: Diagnosis not present

## 2022-10-19 DIAGNOSIS — R278 Other lack of coordination: Secondary | ICD-10-CM | POA: Diagnosis not present

## 2022-10-19 DIAGNOSIS — Z9181 History of falling: Secondary | ICD-10-CM | POA: Diagnosis not present

## 2022-10-19 DIAGNOSIS — M6281 Muscle weakness (generalized): Secondary | ICD-10-CM | POA: Diagnosis not present

## 2022-10-23 DIAGNOSIS — R278 Other lack of coordination: Secondary | ICD-10-CM | POA: Diagnosis not present

## 2022-10-23 DIAGNOSIS — M48061 Spinal stenosis, lumbar region without neurogenic claudication: Secondary | ICD-10-CM | POA: Diagnosis not present

## 2022-10-23 DIAGNOSIS — I69398 Other sequelae of cerebral infarction: Secondary | ICD-10-CM | POA: Diagnosis not present

## 2022-10-23 DIAGNOSIS — M6281 Muscle weakness (generalized): Secondary | ICD-10-CM | POA: Diagnosis not present

## 2022-10-23 DIAGNOSIS — Z9181 History of falling: Secondary | ICD-10-CM | POA: Diagnosis not present

## 2022-10-23 DIAGNOSIS — F0150 Vascular dementia without behavioral disturbance: Secondary | ICD-10-CM | POA: Diagnosis not present

## 2022-10-24 DIAGNOSIS — R410 Disorientation, unspecified: Secondary | ICD-10-CM | POA: Diagnosis not present

## 2022-10-24 DIAGNOSIS — M6281 Muscle weakness (generalized): Secondary | ICD-10-CM | POA: Diagnosis not present

## 2022-10-24 DIAGNOSIS — F0150 Vascular dementia without behavioral disturbance: Secondary | ICD-10-CM | POA: Diagnosis not present

## 2022-10-24 DIAGNOSIS — R5383 Other fatigue: Secondary | ICD-10-CM | POA: Diagnosis not present

## 2022-10-24 DIAGNOSIS — R278 Other lack of coordination: Secondary | ICD-10-CM | POA: Diagnosis not present

## 2022-10-24 DIAGNOSIS — I69398 Other sequelae of cerebral infarction: Secondary | ICD-10-CM | POA: Diagnosis not present

## 2022-10-24 DIAGNOSIS — Z9181 History of falling: Secondary | ICD-10-CM | POA: Diagnosis not present

## 2022-10-24 DIAGNOSIS — M48061 Spinal stenosis, lumbar region without neurogenic claudication: Secondary | ICD-10-CM | POA: Diagnosis not present

## 2022-10-24 LAB — CBC AND DIFFERENTIAL
HCT: 29 — AB (ref 36–46)
Hemoglobin: 9.7 — AB (ref 12.0–16.0)
Neutrophils Absolute: 6345
Platelets: 358 10*3/uL (ref 150–400)
WBC: 9.4

## 2022-10-24 LAB — CBC: RBC: 2.97 — AB (ref 3.87–5.11)

## 2022-10-24 LAB — BASIC METABOLIC PANEL
BUN: 21 (ref 4–21)
CO2: 26 — AB (ref 13–22)
Chloride: 104 (ref 99–108)
Creatinine: 0.8 (ref 0.5–1.1)
Glucose: 72
Potassium: 3.9 meq/L (ref 3.5–5.1)
Sodium: 140 (ref 137–147)

## 2022-10-24 LAB — COMPREHENSIVE METABOLIC PANEL
Calcium: 8.6 — AB (ref 8.7–10.7)
eGFR: 73

## 2022-10-25 DIAGNOSIS — I69398 Other sequelae of cerebral infarction: Secondary | ICD-10-CM | POA: Diagnosis not present

## 2022-10-25 DIAGNOSIS — F0150 Vascular dementia without behavioral disturbance: Secondary | ICD-10-CM | POA: Diagnosis not present

## 2022-10-25 DIAGNOSIS — M6281 Muscle weakness (generalized): Secondary | ICD-10-CM | POA: Diagnosis not present

## 2022-10-25 DIAGNOSIS — R278 Other lack of coordination: Secondary | ICD-10-CM | POA: Diagnosis not present

## 2022-10-25 DIAGNOSIS — Z9181 History of falling: Secondary | ICD-10-CM | POA: Diagnosis not present

## 2022-10-25 DIAGNOSIS — M48061 Spinal stenosis, lumbar region without neurogenic claudication: Secondary | ICD-10-CM | POA: Diagnosis not present

## 2022-11-05 ENCOUNTER — Encounter: Payer: Self-pay | Admitting: Student

## 2022-11-05 ENCOUNTER — Non-Acute Institutional Stay (SKILLED_NURSING_FACILITY): Payer: Self-pay | Admitting: Student

## 2022-11-05 DIAGNOSIS — T17908A Unspecified foreign body in respiratory tract, part unspecified causing other injury, initial encounter: Secondary | ICD-10-CM

## 2022-11-05 DIAGNOSIS — Z66 Do not resuscitate: Secondary | ICD-10-CM | POA: Diagnosis not present

## 2022-11-05 DIAGNOSIS — F01518 Vascular dementia, unspecified severity, with other behavioral disturbance: Secondary | ICD-10-CM

## 2022-11-05 DIAGNOSIS — M48061 Spinal stenosis, lumbar region without neurogenic claudication: Secondary | ICD-10-CM | POA: Diagnosis not present

## 2022-11-05 DIAGNOSIS — R0989 Other specified symptoms and signs involving the circulatory and respiratory systems: Secondary | ICD-10-CM | POA: Diagnosis not present

## 2022-11-05 DIAGNOSIS — E43 Unspecified severe protein-calorie malnutrition: Secondary | ICD-10-CM

## 2022-11-05 DIAGNOSIS — I69398 Other sequelae of cerebral infarction: Secondary | ICD-10-CM | POA: Diagnosis not present

## 2022-11-05 DIAGNOSIS — Z9181 History of falling: Secondary | ICD-10-CM | POA: Diagnosis not present

## 2022-11-05 DIAGNOSIS — F32A Depression, unspecified: Secondary | ICD-10-CM | POA: Diagnosis not present

## 2022-11-05 DIAGNOSIS — R059 Cough, unspecified: Secondary | ICD-10-CM | POA: Diagnosis not present

## 2022-11-05 DIAGNOSIS — M6281 Muscle weakness (generalized): Secondary | ICD-10-CM | POA: Diagnosis not present

## 2022-11-05 DIAGNOSIS — R278 Other lack of coordination: Secondary | ICD-10-CM | POA: Diagnosis not present

## 2022-11-05 DIAGNOSIS — R0602 Shortness of breath: Secondary | ICD-10-CM | POA: Diagnosis not present

## 2022-11-05 DIAGNOSIS — I69354 Hemiplegia and hemiparesis following cerebral infarction affecting left non-dominant side: Secondary | ICD-10-CM

## 2022-11-05 DIAGNOSIS — F0150 Vascular dementia without behavioral disturbance: Secondary | ICD-10-CM | POA: Diagnosis not present

## 2022-11-05 NOTE — Progress Notes (Unsigned)
Location:  Other [place].  Nursing Home Room Number: [facility] [address] Place of Service:  SNF [phone]) Provider:  Earnestine Mealing, MD  Patient Care Team: Earnestine Mealing, MD as PCP - General [facility])  Extended Emergency Contact Information Primary Emergency Contact: Weltz,[name] Address: [address]          [place], [place] [address] Darden Amber of [place] Home Phone: [phone] Relation: Spouse Secondary Emergency Contact: Ogletree,[name] Mobile Phone: [phone] Relation: Son  Code Status:  DNR Goals of care: Advanced Directive information    11/05/2022   12:25 PM  Advanced Directives  Does Patient Have a Medical Advance Directive? Yes  Type of Advance Directive Out of facility DNR (pink MOST or yellow form)  Does patient want to make changes to medical advance directive? No - Patient declined     Chief Complaint  Patient presents with  . Acute Visit    Aspiration.     HPI:  Pt is a 89 y.o. female seen today for an acute and routine visit for Aspiration.   Patient is awake and muttering nonsensical words.   Called son and spoke with husband regarding concern for aspiration risk. During the day, patient was evaluated by SLP and she had a major aspiration event where she started breathing rapidly.    Past Medical History:  Diagnosis Date  . Arthritis   . Chest pain    a. 05/2015 - post-prandial.  . GERD (gastroesophageal reflux disease)    a. takes Omeprazole daily, prev seen by D. Brodie.  Marland Kitchen Heart murmur    a. 05/2013 Echo: EF 55-60%, gr2 DD, mild AI/MR.  Marland Kitchen History of blood transfusion    no abnormal reaction noted   . History of migraine headaches    a. last 10/2013  . Iron deficiency anemia   . Joint pain   . PONV (postoperative nausea and vomiting)   . Rectocele 2011   Past Surgical History:  Procedure Laterality Date  . cataract surgery  Bilateral    R 03/2001; L 05/2001.  Marland Kitchen COLONOSCOPY    . HYSTEROSCOPY  11/1999   w/ resection of endometrial  polyp by uterine curetting.  . INTRAMEDULLARY (IM) NAIL INTERTROCHANTERIC Right 07/15/2020   Procedure: INTRAMEDULLARY (IM) NAIL INTERTROCHANTRIC;  Surgeon: Juanell Fairly, MD;  Location: [facility] ORS;  Service: Orthopedics;  Laterality: Right;  . JOINT REPLACEMENT Bilateral R-9/04, L- 8/05   Total Knee Arthroplasties.  Marland Kitchen KYPHOPLASTY N/A 04/10/2019   Procedure: KYPHOPLASTY;  Surgeon: Kennedy Bucker, MD;  Location: [facility] ORS;  Service: Orthopedics;  Laterality: N/A;  . REVERSE SHOULDER ARTHROPLASTY Right 11/26/2013   Procedure: RIGHT REVERSE SHOULDER ARTHROPLASTY;  Surgeon: Senaida Lange, MD;  Location: [facility] OR;  Service: Orthopedics;  Laterality: Right;  . SHOULDER ARTHROSCOPY Right   . THYROIDECTOMY  at age 53  . TONSILLECTOMY      Allergies  Allergen Reactions  . Sulfonamide Derivatives Hives  . Sulfa Antibiotics Other (See Comments) and Rash    Outpatient Encounter Medications as of 11/05/2022  Medication Sig  . acetaminophen (TYLENOL) 500 MG tablet Take 1,000 mg by mouth 3 (three) times daily.  Marland Kitchen aspirin EC 81 MG tablet Take 81 mg by mouth daily. Swallow whole.  Marland Kitchen atorvastatin (LIPITOR) 40 MG tablet Take 1 tablet (40 mg total) by mouth daily.  Marland Kitchen bismuth subsalicylate (PEPTO BISMOL) 262 MG/15ML suspension Take 30 mLs by mouth every 4 (four) hours as needed.  . calcium carbonate (OSCAL) 1500 (600 Ca) MG TABS tablet Take 600  mg of elemental calcium by mouth daily.  . cholecalciferol (VITAMIN D) 1000 units tablet Take 1,000 Units by mouth daily.  Marland Kitchen dextromethorphan-guaiFENesin (ROBITUSSIN-DM) 10-100 MG/5ML liquid Take 5 mLs by mouth every 4 (four) hours as needed for cough.  . mirabegron ER (MYRBETRIQ) 25 MG TB24 tablet Take 25 mg by mouth daily.  . mirtazapine (REMERON) 7.5 MG tablet Take 7.5 mg by mouth at bedtime.  . polyethylene glycol (MIRALAX / GLYCOLAX) 17 g packet Take 17 g by mouth every other day.  . saccharomyces boulardii (FLORASTOR) 250 MG capsule Take 250 mg by mouth daily.  Marland Kitchen  UNABLE TO FIND Take 4 oz by mouth every evening. Med Name: Medpass  . Zinc Oxide (TRIPLE PASTE) 12.8 % ointment Apply 1 Application topically. Every shift.   No facility-administered encounter medications on file as of 11/05/2022.    Review of Systems  Immunization History  Administered Date(s) Administered  . Covid-19, Mrna,Vaccine(Spikevax)81yrs and older 12/29/2021, 05/29/2022  . Influenza-Unspecified 12/05/2021  . Pneumococcal Conjugate-13 03/31/2014  . Pneumococcal Polysaccharide-23 04/17/2016  . Pneumococcal-Unspecified 02/19/2001  . Tdap 11/13/2011  . Unspecified SARS-COV-2 Vaccination 03/06/2019, 04/03/2019, 01/05/2020, 07/07/2020, 07/18/2021, 12/29/2021  . Zoster Recombinant(Shingrix) 04/30/2008, 06/09/2019, 10/21/2019  . Zoster, Live 04/30/2008   Pertinent  Health Maintenance Due  Topic Date Due  . INFLUENZA VACCINE  09/20/2022  . DEXA SCAN  Discontinued      06/22/2021    8:36 AM 06/22/2021    1:00 PM 06/22/2021    9:40 PM 06/27/2021   11:48 PM 09/25/2022    3:48 PM  Fall Risk  Falls in the past year?     1  Was there an injury with Fall?     0  Fall Risk Category Calculator     2  (RETIRED) Patient Fall Risk Level High fall risk High fall risk High fall risk High fall risk   Patient at Risk for Falls Due to     History of fall(s);Impaired balance/gait;Impaired mobility   Functional Status Survey:    Vitals:   11/05/22 1218  BP: 122/77  Pulse: 86  Resp: 16  Temp: (!) 97.4 F (36.3 C)  SpO2: 92%  Weight: 117 lb 6.4 oz (53.3 kg)  Height: 5\' 5"  (1.651 m)   Body mass index is 19.54 kg/m. Physical Exam  Labs reviewed: Recent Labs    06/22/22 1347 07/02/22 0000 07/12/22 0000 09/20/22 0000 10/24/22 0000  NA 141   < > 141 139 140  K 4.0  --  4.2 4.2 3.9  CL 107   < > 107 105 104  CO2 26   < > 26* 25* 26*  GLUCOSE 144*  --   --   --   --   BUN 23   < > 28* 30* 21  CREATININE 0.89   < > 0.7 0.9 0.8  CALCIUM 9.0   < > 8.5* 8.9 8.6*   < > = values in this  interval not displayed.   Recent Labs    12/18/21 0000 06/22/22 1347 07/12/22 0000  AST 21 27 23   ALT 17 16 19   ALKPHOS 97 88 83  BILITOT  --  0.7  --   PROT  --  7.0  --   ALBUMIN 3.7 3.3* 3.5   Recent Labs    06/22/22 1347 07/02/22 0000 08/20/22 0000 09/20/22 0000 10/24/22 0000  WBC 9.2   < > 11.8 8.8 9.4  NEUTROABS 5.8  --   --  5,993.00 6,345.00  HGB  10.0*   < > 9.2* 10.6* 9.7*  HCT 30.9*   < > 29* 33* 29*  MCV 101.0*  --   --   --   --   PLT 399   < > 330 388 358   < > = values in this interval not displayed.   No results found for: "TSH" Lab Results  Component Value Date   HGBA1C 5.2 06/22/2022   Lab Results  Component Value Date   CHOL 154 06/23/2022   HDL 67 06/23/2022   LDLCALC 78 06/23/2022   TRIG 45 06/23/2022   CHOLHDL 2.3 06/23/2022    Significant Diagnostic Results in last 30 days:  No results found.  Assessment/Plan There are no diagnoses linked to this encounter.   Family/ staff Communication: ***  Labs/tests ordered:  ***

## 2022-11-06 ENCOUNTER — Other Ambulatory Visit: Payer: Self-pay | Admitting: Nurse Practitioner

## 2022-11-06 MED ORDER — MORPHINE SULFATE (CONCENTRATE) 20 MG/ML PO SOLN
5.0000 mg | ORAL | 0 refills | Status: DC | PRN
Start: 2022-11-06 — End: 2023-03-30

## 2022-11-16 DIAGNOSIS — Z23 Encounter for immunization: Secondary | ICD-10-CM | POA: Diagnosis not present

## 2022-11-21 ENCOUNTER — Encounter: Payer: Self-pay | Admitting: Student

## 2022-11-21 ENCOUNTER — Non-Acute Institutional Stay (SKILLED_NURSING_FACILITY): Payer: Medicare Other | Admitting: Student

## 2022-11-21 DIAGNOSIS — T17908A Unspecified foreign body in respiratory tract, part unspecified causing other injury, initial encounter: Secondary | ICD-10-CM

## 2022-11-21 DIAGNOSIS — Z66 Do not resuscitate: Secondary | ICD-10-CM

## 2022-11-21 DIAGNOSIS — E43 Unspecified severe protein-calorie malnutrition: Secondary | ICD-10-CM

## 2022-11-21 DIAGNOSIS — F01518 Vascular dementia, unspecified severity, with other behavioral disturbance: Secondary | ICD-10-CM

## 2022-11-21 DIAGNOSIS — I69354 Hemiplegia and hemiparesis following cerebral infarction affecting left non-dominant side: Secondary | ICD-10-CM

## 2022-11-21 NOTE — Progress Notes (Unsigned)
Location:  Other [place].  Nursing Home Room Number: [facility] [address] Place of Service:  SNF [phone]) Provider:  Earnestine Mealing, MD  Patient Care Team: Earnestine Mealing, MD as PCP - General [facility])  Extended Emergency Contact Information Primary Emergency Contact: Corcino,[name] Address: [address]          [place], [place] [address] [place] of [place] Home Phone: [phone] Relation: Spouse Secondary Emergency Contact: Vitiello,[name] Mobile Phone: [phone] Relation: Son  Code Status:  DNR Goals of care: Advanced Directive information    11/21/2022   12:17 PM  Advanced Directives  Does Patient Have a Medical Advance Directive? Yes  Type of Advance Directive Out of facility DNR (pink MOST or yellow form)  Does patient want to make changes to medical advance directive? No - Patient declined     Chief Complaint  Patient presents with   Medical Management of Chronic Issues    Medical Management of Chronic Issues.     HPI:  Pt is a 89 y.o. female seen today for medical management of chronic diseases.    Nonsensical conversation. Pleasant, eating breakfast.   Nursing primary concern at this time is constant and persistent aspiration as well as. She "shovels" food in her mouth and has to be coached to take smaller, frequent bites.    Past Medical History:  Diagnosis Date   Arthritis    Chest pain    a. 05/2015 - post-prandial.   GERD (gastroesophageal reflux disease)    a. takes Omeprazole daily, prev seen by D. Brodie.   Heart murmur    a. 05/2013 Echo: EF 55-60%, gr2 DD, mild AI/MR.   History of blood transfusion    no abnormal reaction noted    History of migraine headaches    a. last 10/2013   Iron deficiency anemia    Joint pain    PONV (postoperative nausea and vomiting)    Rectocele 2011   Past Surgical History:  Procedure Laterality Date   cataract surgery  Bilateral    R 03/2001; L 05/2001.   COLONOSCOPY     HYSTEROSCOPY  11/1999   w/  resection of endometrial polyp by uterine curetting.   INTRAMEDULLARY (IM) NAIL INTERTROCHANTERIC Right 07/15/2020   Procedure: INTRAMEDULLARY (IM) NAIL INTERTROCHANTRIC;  Surgeon: Juanell Fairly, MD;  Location: [facility] ORS;  Service: Orthopedics;  Laterality: Right;   JOINT REPLACEMENT Bilateral R-9/04, L- 8/05   Total Knee Arthroplasties.   KYPHOPLASTY N/A 04/10/2019   Procedure: KYPHOPLASTY;  Surgeon: Kennedy Bucker, MD;  Location: [facility] ORS;  Service: Orthopedics;  Laterality: N/A;   REVERSE SHOULDER ARTHROPLASTY Right 11/26/2013   Procedure: RIGHT REVERSE SHOULDER ARTHROPLASTY;  Surgeon: Senaida Lange, MD;  Location: [facility] OR;  Service: Orthopedics;  Laterality: Right;   SHOULDER ARTHROSCOPY Right    THYROIDECTOMY  at age 69   TONSILLECTOMY      Allergies  Allergen Reactions   Sulfonamide Derivatives Hives   Sulfa Antibiotics Other (See Comments) and Rash    Outpatient Encounter Medications as of 11/21/2022  Medication Sig   acetaminophen (TYLENOL) 500 MG tablet Take 1,000 mg by mouth 3 (three) times daily.   aspirin EC 81 MG tablet Take 81 mg by mouth daily. Swallow whole.   atorvastatin (LIPITOR) 40 MG tablet Take 1 tablet (40 mg total) by mouth daily.   bismuth subsalicylate (PEPTO BISMOL) 262 MG/15ML suspension Take 30 mLs by mouth every 4 (four) hours as needed.   calcium carbonate (OSCAL) 1500 (600 Ca) MG TABS  tablet Take 600 mg of elemental calcium by mouth daily.   cholecalciferol (VITAMIN D) 1000 units tablet Take 1,000 Units by mouth daily.   mirabegron ER (MYRBETRIQ) 25 MG TB24 tablet Take 25 mg by mouth daily.   mirtazapine (REMERON) 7.5 MG tablet Take 7.5 mg by mouth at bedtime.   morphine (ROXANOL) 20 MG/ML concentrated solution Take 0.25 mLs (5 mg total) by mouth every 2 (two) hours as needed. (Patient taking differently: Take 5 mg by mouth every 4 (four) hours as needed.)   polyethylene glycol (MIRALAX / GLYCOLAX) 17 g packet Take 17 g by mouth every other day.    saccharomyces boulardii (FLORASTOR) 250 MG capsule Take 250 mg by mouth daily.   UNABLE TO FIND Take 4 oz by mouth every evening. Med Name: Medpass   Zinc Oxide (TRIPLE PASTE) 12.8 % ointment Apply 1 Application topically. Every shift.   [DISCONTINUED] dextromethorphan-guaiFENesin (ROBITUSSIN-DM) 10-100 MG/5ML liquid Take 5 mLs by mouth every 4 (four) hours as needed for cough.   No facility-administered encounter medications on file as of 11/21/2022.    Review of Systems  Immunization History  Administered Date(s) Administered   Influenza-Unspecified 12/05/2021   Moderna Covid-19 Fall Seasonal Vaccine 39yrs & older 12/29/2021, 05/29/2022   Pneumococcal Conjugate-13 03/31/2014   Pneumococcal Polysaccharide-23 04/17/2016   Pneumococcal-Unspecified 02/19/2001   Tdap 11/13/2011   Unspecified SARS-COV-2 Vaccination 03/06/2019, 04/03/2019, 01/05/2020, 07/07/2020, 07/18/2021, 12/29/2021, 11/16/2022   Zoster Recombinant(Shingrix) 04/30/2008, 06/09/2019, 10/21/2019   Zoster, Live 04/30/2008   Pertinent  Health Maintenance Due  Topic Date Due   INFLUENZA VACCINE  09/20/2022   DEXA SCAN  Discontinued      06/22/2021    8:36 AM 06/22/2021    1:00 PM 06/22/2021    9:40 PM 06/27/2021   11:48 PM 09/25/2022    3:48 PM  Fall Risk  Falls in the past year?     1  Was there an injury with Fall?     0  Fall Risk Category Calculator     2  (RETIRED) Patient Fall Risk Level High fall risk High fall risk High fall risk High fall risk   Patient at Risk for Falls Due to     History of fall(s);Impaired balance/gait;Impaired mobility   Functional Status Survey:    Vitals:   11/21/22 1212  BP: 107/70  Pulse: 84  Resp: (!) 28  Temp: 97.7 F (36.5 C)  SpO2: 90%  Weight: 113 lb 9.6 oz (51.5 kg)  Height: 5\' 5"  (1.651 m)   Body mass index is 18.9 kg/m. Physical Exam Constitutional:      Appearance: Normal appearance.     Comments: Coughing aspiration while eating  Cardiovascular:     Rate and  Rhythm: Normal rate.     Pulses: Normal pulses.  Pulmonary:     Effort: Pulmonary effort is normal.  Neurological:     Mental Status: She is alert.     Labs reviewed: Recent Labs    06/22/22 1347 07/02/22 0000 07/12/22 0000 09/20/22 0000 10/24/22 0000  NA 141   < > 141 139 140  K 4.0  --  4.2 4.2 3.9  CL 107   < > 107 105 104  CO2 26   < > 26* 25* 26*  GLUCOSE 144*  --   --   --   --   BUN 23   < > 28* 30* 21  CREATININE 0.89   < > 0.7 0.9 0.8  CALCIUM 9.0   < >  8.5* 8.9 8.6*   < > = values in this interval not displayed.   Recent Labs    12/18/21 0000 06/22/22 1347 07/12/22 0000  AST 21 27 23   ALT 17 16 19   ALKPHOS 97 88 83  BILITOT  --  0.7  --   PROT  --  7.0  --   ALBUMIN 3.7 3.3* 3.5   Recent Labs    06/22/22 1347 07/02/22 0000 08/20/22 0000 09/20/22 0000 10/24/22 0000  WBC 9.2   < > 11.8 8.8 9.4  NEUTROABS 5.8  --   --  5,993.00 6,345.00  HGB 10.0*   < > 9.2* 10.6* 9.7*  HCT 30.9*   < > 29* 33* 29*  MCV 101.0*  --   --   --   --   PLT 399   < > 330 388 358   < > = values in this interval not displayed.   No results found for: "TSH" Lab Results  Component Value Date   HGBA1C 5.2 06/22/2022   Lab Results  Component Value Date   CHOL 154 06/23/2022   HDL 67 06/23/2022   LDLCALC 78 06/23/2022   TRIG 45 06/23/2022   CHOLHDL 2.3 06/23/2022    Significant Diagnostic Results in last 30 days:  No results found.  Assessment/Plan Hemiparesis affecting left side as late effect of stroke (HCC)  Protein-calorie malnutrition, severe (HCC)  Vascular dementia with behavior disturbance (HCC)  Do not resuscitate  Aspiration into airway, initial encounter Patient with hx of stroke which has left her with subsequent hemiparesis and some dysphagia. Patient has had weight loss over the course of the last few months due to changes in dietary recommendations from speech therapy. Patient is high risk for aspiration pneumonia, and the family has determined  they do not want her hospitalized if she were to become ill. Deferred barium swallow. Continue supportive care. Comfort medications available in the event she develops shortness of breath or pain. Continue myrbetriq for polyuria. Continue ASA and lipitor. Family declines hospice, continue GOC conversations.   Family/ staff Communication: nursing  Labs/tests ordered:  none

## 2022-12-24 DIAGNOSIS — D509 Iron deficiency anemia, unspecified: Secondary | ICD-10-CM | POA: Diagnosis not present

## 2022-12-24 LAB — BASIC METABOLIC PANEL
BUN: 21 (ref 4–21)
CO2: 28 — AB (ref 13–22)
Chloride: 107 (ref 99–108)
Creatinine: 0.8 (ref 0.5–1.1)
Glucose: 78
Potassium: 3.8 meq/L (ref 3.5–5.1)
Sodium: 140 (ref 137–147)

## 2022-12-24 LAB — CBC AND DIFFERENTIAL
HCT: 30 — AB (ref 36–46)
Hemoglobin: 9.6 — AB (ref 12.0–16.0)
Neutrophils Absolute: 5338
Platelets: 382 10*3/uL (ref 150–400)
WBC: 8.2

## 2022-12-24 LAB — COMPREHENSIVE METABOLIC PANEL
Calcium: 8.5 — AB (ref 8.7–10.7)
eGFR: 72

## 2022-12-24 LAB — CBC: RBC: 2.99 — AB (ref 3.87–5.11)

## 2023-01-22 DIAGNOSIS — R4189 Other symptoms and signs involving cognitive functions and awareness: Secondary | ICD-10-CM | POA: Diagnosis not present

## 2023-01-22 DIAGNOSIS — E441 Mild protein-calorie malnutrition: Secondary | ICD-10-CM | POA: Diagnosis not present

## 2023-01-22 DIAGNOSIS — Z741 Need for assistance with personal care: Secondary | ICD-10-CM | POA: Diagnosis not present

## 2023-01-22 DIAGNOSIS — R278 Other lack of coordination: Secondary | ICD-10-CM | POA: Diagnosis not present

## 2023-01-22 DIAGNOSIS — F03B18 Unspecified dementia, moderate, with other behavioral disturbance: Secondary | ICD-10-CM | POA: Diagnosis not present

## 2023-01-23 ENCOUNTER — Telehealth: Payer: Self-pay | Admitting: *Deleted

## 2023-01-23 DIAGNOSIS — Z741 Need for assistance with personal care: Secondary | ICD-10-CM | POA: Diagnosis not present

## 2023-01-23 DIAGNOSIS — F03B18 Unspecified dementia, moderate, with other behavioral disturbance: Secondary | ICD-10-CM | POA: Diagnosis not present

## 2023-01-23 DIAGNOSIS — R4189 Other symptoms and signs involving cognitive functions and awareness: Secondary | ICD-10-CM | POA: Diagnosis not present

## 2023-01-23 DIAGNOSIS — R278 Other lack of coordination: Secondary | ICD-10-CM | POA: Diagnosis not present

## 2023-01-23 DIAGNOSIS — E441 Mild protein-calorie malnutrition: Secondary | ICD-10-CM | POA: Diagnosis not present

## 2023-01-23 NOTE — Telephone Encounter (Signed)
Melissa [name], Husband stopped in the clinic with a Paper from [name] stating that Mirabegron 25mg  is not on [employer].   Patient Husband stated that he WANTS to stay with the Mirabegron (Myrbetriq) because patient has IMPROVED with it. Stated that 27 tablets at list price of $437.47, Insurance Cost is $40.77 and they can Handle that and wants to continue getting the medication.    FYI

## 2023-01-24 ENCOUNTER — Non-Acute Institutional Stay (SKILLED_NURSING_FACILITY): Payer: Medicare Other | Admitting: Nurse Practitioner

## 2023-01-24 ENCOUNTER — Encounter: Payer: Self-pay | Admitting: Nurse Practitioner

## 2023-01-24 DIAGNOSIS — R4189 Other symptoms and signs involving cognitive functions and awareness: Secondary | ICD-10-CM | POA: Diagnosis not present

## 2023-01-24 DIAGNOSIS — K5904 Chronic idiopathic constipation: Secondary | ICD-10-CM

## 2023-01-24 DIAGNOSIS — R278 Other lack of coordination: Secondary | ICD-10-CM | POA: Diagnosis not present

## 2023-01-24 DIAGNOSIS — D509 Iron deficiency anemia, unspecified: Secondary | ICD-10-CM | POA: Diagnosis not present

## 2023-01-24 DIAGNOSIS — E782 Mixed hyperlipidemia: Secondary | ICD-10-CM

## 2023-01-24 DIAGNOSIS — E43 Unspecified severe protein-calorie malnutrition: Secondary | ICD-10-CM

## 2023-01-24 DIAGNOSIS — I69354 Hemiplegia and hemiparesis following cerebral infarction affecting left non-dominant side: Secondary | ICD-10-CM

## 2023-01-24 DIAGNOSIS — F01518 Vascular dementia, unspecified severity, with other behavioral disturbance: Secondary | ICD-10-CM

## 2023-01-24 DIAGNOSIS — E441 Mild protein-calorie malnutrition: Secondary | ICD-10-CM | POA: Diagnosis not present

## 2023-01-24 DIAGNOSIS — F03B18 Unspecified dementia, moderate, with other behavioral disturbance: Secondary | ICD-10-CM | POA: Diagnosis not present

## 2023-01-24 DIAGNOSIS — Z741 Need for assistance with personal care: Secondary | ICD-10-CM | POA: Diagnosis not present

## 2023-01-24 DIAGNOSIS — N3281 Overactive bladder: Secondary | ICD-10-CM | POA: Diagnosis not present

## 2023-01-24 DIAGNOSIS — M810 Age-related osteoporosis without current pathological fracture: Secondary | ICD-10-CM

## 2023-01-24 NOTE — Progress Notes (Signed)
Location:  [facility] [place].  Nursing Home Room Number: [facility] [address] Place of Service:  SNF [phone]) [name], NP  PCP: Earnestine Mealing, MD  Patient Care Team: Earnestine Mealing, MD as PCP - General [facility] Medicine)  Extended Emergency Contact Information Primary Emergency Contact: Lomeli,[name] Address: [address]          [place], [place] [address] [place] of [place] Home Phone: [phone] Relation: Spouse Secondary Emergency Contact: Chumley,[name] Mobile Phone: [phone] Relation: Son  Goals of care: Advanced Directive information    01/24/2023   12:55 PM  Advanced Directives  Does Patient Have a Medical Advance Directive? Yes  Type of Advance Directive Out of facility DNR (pink MOST or yellow form)  Does patient want to make changes to medical advance directive? No - Patient declined     Chief Complaint  Patient presents with   Medical Management of Chronic Issues    Medical Management of Chronic Issues.     HPI:  Pt is a 89 y.o. female seen today for medical management of chronic disease.  Pt with hx of dementia, CVA, hyperlipidemia. She continues to work with OT during meals times.  She will feed her self but mostly requires assistance. Weight has been stable.  No increase in anxiety or depression   Past Medical History:  Diagnosis Date   Arthritis    Chest pain    a. 05/2015 - post-prandial.   GERD (gastroesophageal reflux disease)    a. takes Omeprazole daily, prev seen by D. Brodie.   Heart murmur    a. 05/2013 Echo: EF 55-60%, gr2 DD, mild AI/MR.   History of blood transfusion    no abnormal reaction noted    History of migraine headaches    a. last 10/2013   Iron deficiency anemia    Joint pain    PONV (postoperative nausea and vomiting)    Rectocele 2011   Past Surgical History:  Procedure Laterality Date   cataract surgery  Bilateral    R 03/2001; L 05/2001.   COLONOSCOPY     HYSTEROSCOPY  11/1999   w/ resection of endometrial  polyp by uterine curetting.   INTRAMEDULLARY (IM) NAIL INTERTROCHANTERIC Right 07/15/2020   Procedure: INTRAMEDULLARY (IM) NAIL INTERTROCHANTRIC;  Surgeon: Juanell Fairly, MD;  Location: [facility] ORS;  Service: Orthopedics;  Laterality: Right;   JOINT REPLACEMENT Bilateral R-9/04, L- 8/05   Total Knee Arthroplasties.   KYPHOPLASTY N/A 04/10/2019   Procedure: KYPHOPLASTY;  Surgeon: Kennedy Bucker, MD;  Location: [facility] ORS;  Service: Orthopedics;  Laterality: N/A;   REVERSE SHOULDER ARTHROPLASTY Right 11/26/2013   Procedure: RIGHT REVERSE SHOULDER ARTHROPLASTY;  Surgeon: Senaida Lange, MD;  Location: [facility] OR;  Service: Orthopedics;  Laterality: Right;   SHOULDER ARTHROSCOPY Right    THYROIDECTOMY  at age 30   TONSILLECTOMY      Allergies  Allergen Reactions   Sulfonamide Derivatives Hives   Sulfa Antibiotics Other (See Comments) and Rash    Outpatient Encounter Medications as of 01/24/2023  Medication Sig   acetaminophen (TYLENOL) 500 MG tablet Take 1,000 mg by mouth 3 (three) times daily.   aspirin EC 81 MG tablet Take 81 mg by mouth daily. Swallow whole.   atorvastatin (LIPITOR) 40 MG tablet Take 1 tablet (40 mg total) by mouth daily.   bismuth subsalicylate (PEPTO BISMOL) 262 MG/15ML suspension Take 30 mLs by mouth every 4 (four) hours as needed.   calcium carbonate (OSCAL) 1500 (600 Ca) MG TABS tablet Take 600 mg  of elemental calcium by mouth daily.   cholecalciferol (VITAMIN D) 1000 units tablet Take 1,000 Units by mouth daily.   mirabegron ER (MYRBETRIQ) 25 MG TB24 tablet Take 25 mg by mouth daily.   mirtazapine (REMERON) 7.5 MG tablet Take 7.5 mg by mouth at bedtime.   morphine (ROXANOL) 20 MG/ML concentrated solution Take 0.25 mLs (5 mg total) by mouth every 2 (two) hours as needed. (Patient taking differently: Take 5 mg by mouth every 4 (four) hours as needed.)   polyethylene glycol (MIRALAX / GLYCOLAX) 17 g packet Take 17 g by mouth every other day.   saccharomyces boulardii  (FLORASTOR) 250 MG capsule Take 250 mg by mouth daily.   UNABLE TO FIND Take 4 oz by mouth every evening. Med Name: Medpass   Zinc Oxide (TRIPLE PASTE) 12.8 % ointment Apply 1 Application topically. Every shift.   No facility-administered encounter medications on file as of 01/24/2023.    Review of Systems  Unable to perform ROS: Dementia     Immunization History  Administered Date(s) Administered   Influenza-Unspecified 12/05/2021, 12/12/2022   Moderna Covid-19 Fall Seasonal Vaccine 58yrs & older 12/29/2021, 05/29/2022   Pneumococcal Conjugate-13 03/31/2014   Pneumococcal Polysaccharide-23 04/17/2016   Pneumococcal-Unspecified 02/19/2001   Tdap 11/13/2011   Unspecified SARS-COV-2 Vaccination 03/06/2019, 04/03/2019, 01/05/2020, 07/07/2020, 07/18/2021, 12/29/2021, 11/16/2022, 11/16/2022   Zoster Recombinant(Shingrix) 04/30/2008, 06/09/2019, 10/21/2019   Zoster, Live 04/30/2008   Pertinent  Health Maintenance Due  Topic Date Due   INFLUENZA VACCINE  Completed   DEXA SCAN  Discontinued      06/22/2021    8:36 AM 06/22/2021    1:00 PM 06/22/2021    9:40 PM 06/27/2021   11:48 PM 09/25/2022    3:48 PM  Fall Risk  Falls in the past year?     1  Was there an injury with Fall?     0  Fall Risk Category Calculator     2  (RETIRED) Patient Fall Risk Level High fall risk High fall risk High fall risk High fall risk   Patient at Risk for Falls Due to     History of fall(s);Impaired balance/gait;Impaired mobility   Functional Status Survey:    Vitals:   01/24/23 1249  BP: 132/63  Pulse: 70  Resp: 13  Temp: 98.2 F (36.8 C)  SpO2: 93%  Weight: 118 lb (53.5 kg)  Height: 5\' 5"  (1.651 m)   Body mass index is 19.64 kg/m. Physical Exam Constitutional:      General: She is not in acute distress.    Appearance: She is well-developed. She is not diaphoretic.  HENT:     Head: Normocephalic and atraumatic.     Mouth/Throat:     Pharynx: No oropharyngeal exudate.  Eyes:      Conjunctiva/sclera: Conjunctivae normal.     Pupils: Pupils are equal, round, and reactive to light.  Cardiovascular:     Rate and Rhythm: Normal rate and regular rhythm.     Heart sounds: Normal heart sounds.  Pulmonary:     Effort: Pulmonary effort is normal.     Breath sounds: Normal breath sounds.  Abdominal:     General: Bowel sounds are normal.     Palpations: Abdomen is soft.  Musculoskeletal:     Cervical back: Normal range of motion and neck supple.     Right lower leg: No edema.     Left lower leg: No edema.  Skin:    General: Skin is warm and dry.  Neurological:     Mental Status: She is alert. She is disoriented.     Motor: Weakness present.     Gait: Gait abnormal.  Psychiatric:        Mood and Affect: Mood normal.     Labs reviewed: Recent Labs    06/22/22 1347 07/02/22 0000 09/20/22 0000 10/24/22 0000 12/24/22 0000  NA 141   < > 139 140 140  K 4.0   < > 4.2 3.9 3.8  CL 107   < > 105 104 107  CO2 26   < > 25* 26* 28*  GLUCOSE 144*  --   --   --   --   BUN 23   < > 30* 21 21  CREATININE 0.89   < > 0.9 0.8 0.8  CALCIUM 9.0   < > 8.9 8.6* 8.5*   < > = values in this interval not displayed.   Recent Labs    06/22/22 1347 07/12/22 0000  AST 27 23  ALT 16 19  ALKPHOS 88 83  BILITOT 0.7  --   PROT 7.0  --   ALBUMIN 3.3* 3.5   Recent Labs    06/22/22 1347 07/02/22 0000 09/20/22 0000 10/24/22 0000 12/24/22 0000  WBC 9.2   < > 8.8 9.4 8.2  NEUTROABS 5.8  --  5,993.00 6,345.00 5,338.00  HGB 10.0*   < > 10.6* 9.7* 9.6*  HCT 30.9*   < > 33* 29* 30*  MCV 101.0*  --   --   --   --   PLT 399   < > 388 358 382   < > = values in this interval not displayed.   No results found for: "TSH" Lab Results  Component Value Date   HGBA1C 5.2 06/22/2022   Lab Results  Component Value Date   CHOL 154 06/23/2022   HDL 67 06/23/2022   LDLCALC 78 06/23/2022   TRIG 45 06/23/2022   CHOLHDL 2.3 06/23/2022    Significant Diagnostic Results in last 30  days:  No results found.  Assessment/Plan 1. Iron deficiency anemia, unspecified iron deficiency anemia type -hgb stable, will continue to monitor.  No signs of blood loss  2. Chronic idiopathic constipation Controlled on current regimen.   3. OAB (overactive bladder) Stable on myrbetriq   4. Age related osteoporosis, unspecified pathological fracture presence Continues on cal and vit d  5. Mixed hyperlipidemia -continues on lipitor 40 mg daily   6. Hemiparesis affecting left side as late effect of stroke (HCC) Stable, continues on asa and support from staff  7. Protein-calorie malnutrition, severe (HCC) -continues on supplements  8. Dementia -Stable, no acute changes in cognitive or functional status, continue supportive care.    Janene Harvey. Biagio Borg [facility] [phone]

## 2023-01-25 DIAGNOSIS — R278 Other lack of coordination: Secondary | ICD-10-CM | POA: Diagnosis not present

## 2023-01-25 DIAGNOSIS — Z741 Need for assistance with personal care: Secondary | ICD-10-CM | POA: Diagnosis not present

## 2023-01-25 DIAGNOSIS — E441 Mild protein-calorie malnutrition: Secondary | ICD-10-CM | POA: Diagnosis not present

## 2023-01-25 DIAGNOSIS — R4189 Other symptoms and signs involving cognitive functions and awareness: Secondary | ICD-10-CM | POA: Diagnosis not present

## 2023-01-25 DIAGNOSIS — F03B18 Unspecified dementia, moderate, with other behavioral disturbance: Secondary | ICD-10-CM | POA: Diagnosis not present

## 2023-01-28 DIAGNOSIS — F03B18 Unspecified dementia, moderate, with other behavioral disturbance: Secondary | ICD-10-CM | POA: Diagnosis not present

## 2023-01-28 DIAGNOSIS — R4189 Other symptoms and signs involving cognitive functions and awareness: Secondary | ICD-10-CM | POA: Diagnosis not present

## 2023-01-28 DIAGNOSIS — E441 Mild protein-calorie malnutrition: Secondary | ICD-10-CM | POA: Diagnosis not present

## 2023-01-28 DIAGNOSIS — R278 Other lack of coordination: Secondary | ICD-10-CM | POA: Diagnosis not present

## 2023-01-28 DIAGNOSIS — Z741 Need for assistance with personal care: Secondary | ICD-10-CM | POA: Diagnosis not present

## 2023-01-31 DIAGNOSIS — R278 Other lack of coordination: Secondary | ICD-10-CM | POA: Diagnosis not present

## 2023-01-31 DIAGNOSIS — E441 Mild protein-calorie malnutrition: Secondary | ICD-10-CM | POA: Diagnosis not present

## 2023-01-31 DIAGNOSIS — Z741 Need for assistance with personal care: Secondary | ICD-10-CM | POA: Diagnosis not present

## 2023-01-31 DIAGNOSIS — F03B18 Unspecified dementia, moderate, with other behavioral disturbance: Secondary | ICD-10-CM | POA: Diagnosis not present

## 2023-01-31 DIAGNOSIS — R4189 Other symptoms and signs involving cognitive functions and awareness: Secondary | ICD-10-CM | POA: Diagnosis not present

## 2023-02-14 DIAGNOSIS — F03B18 Unspecified dementia, moderate, with other behavioral disturbance: Secondary | ICD-10-CM | POA: Diagnosis not present

## 2023-02-14 DIAGNOSIS — E441 Mild protein-calorie malnutrition: Secondary | ICD-10-CM | POA: Diagnosis not present

## 2023-02-14 DIAGNOSIS — R278 Other lack of coordination: Secondary | ICD-10-CM | POA: Diagnosis not present

## 2023-02-14 DIAGNOSIS — R4189 Other symptoms and signs involving cognitive functions and awareness: Secondary | ICD-10-CM | POA: Diagnosis not present

## 2023-02-14 DIAGNOSIS — Z741 Need for assistance with personal care: Secondary | ICD-10-CM | POA: Diagnosis not present

## 2023-02-15 DIAGNOSIS — R4189 Other symptoms and signs involving cognitive functions and awareness: Secondary | ICD-10-CM | POA: Diagnosis not present

## 2023-02-15 DIAGNOSIS — Z741 Need for assistance with personal care: Secondary | ICD-10-CM | POA: Diagnosis not present

## 2023-02-15 DIAGNOSIS — F03B18 Unspecified dementia, moderate, with other behavioral disturbance: Secondary | ICD-10-CM | POA: Diagnosis not present

## 2023-02-15 DIAGNOSIS — R278 Other lack of coordination: Secondary | ICD-10-CM | POA: Diagnosis not present

## 2023-02-15 DIAGNOSIS — E441 Mild protein-calorie malnutrition: Secondary | ICD-10-CM | POA: Diagnosis not present

## 2023-02-19 DIAGNOSIS — R278 Other lack of coordination: Secondary | ICD-10-CM | POA: Diagnosis not present

## 2023-02-19 DIAGNOSIS — F03B18 Unspecified dementia, moderate, with other behavioral disturbance: Secondary | ICD-10-CM | POA: Diagnosis not present

## 2023-02-19 DIAGNOSIS — E441 Mild protein-calorie malnutrition: Secondary | ICD-10-CM | POA: Diagnosis not present

## 2023-02-19 DIAGNOSIS — R4189 Other symptoms and signs involving cognitive functions and awareness: Secondary | ICD-10-CM | POA: Diagnosis not present

## 2023-02-19 DIAGNOSIS — Z741 Need for assistance with personal care: Secondary | ICD-10-CM | POA: Diagnosis not present

## 2023-02-20 DIAGNOSIS — R278 Other lack of coordination: Secondary | ICD-10-CM | POA: Diagnosis not present

## 2023-02-20 DIAGNOSIS — F03B18 Unspecified dementia, moderate, with other behavioral disturbance: Secondary | ICD-10-CM | POA: Diagnosis not present

## 2023-02-20 DIAGNOSIS — Z741 Need for assistance with personal care: Secondary | ICD-10-CM | POA: Diagnosis not present

## 2023-02-20 DIAGNOSIS — E441 Mild protein-calorie malnutrition: Secondary | ICD-10-CM | POA: Diagnosis not present

## 2023-02-20 DIAGNOSIS — R4189 Other symptoms and signs involving cognitive functions and awareness: Secondary | ICD-10-CM | POA: Diagnosis not present

## 2023-03-05 DIAGNOSIS — E441 Mild protein-calorie malnutrition: Secondary | ICD-10-CM | POA: Diagnosis not present

## 2023-03-05 DIAGNOSIS — F03B18 Unspecified dementia, moderate, with other behavioral disturbance: Secondary | ICD-10-CM | POA: Diagnosis not present

## 2023-03-05 DIAGNOSIS — Z741 Need for assistance with personal care: Secondary | ICD-10-CM | POA: Diagnosis not present

## 2023-03-05 DIAGNOSIS — R278 Other lack of coordination: Secondary | ICD-10-CM | POA: Diagnosis not present

## 2023-03-05 DIAGNOSIS — R4189 Other symptoms and signs involving cognitive functions and awareness: Secondary | ICD-10-CM | POA: Diagnosis not present

## 2023-03-07 DIAGNOSIS — F03B18 Unspecified dementia, moderate, with other behavioral disturbance: Secondary | ICD-10-CM | POA: Diagnosis not present

## 2023-03-07 DIAGNOSIS — E441 Mild protein-calorie malnutrition: Secondary | ICD-10-CM | POA: Diagnosis not present

## 2023-03-07 DIAGNOSIS — Z741 Need for assistance with personal care: Secondary | ICD-10-CM | POA: Diagnosis not present

## 2023-03-07 DIAGNOSIS — R4189 Other symptoms and signs involving cognitive functions and awareness: Secondary | ICD-10-CM | POA: Diagnosis not present

## 2023-03-07 DIAGNOSIS — R278 Other lack of coordination: Secondary | ICD-10-CM | POA: Diagnosis not present

## 2023-03-10 DIAGNOSIS — F03B18 Unspecified dementia, moderate, with other behavioral disturbance: Secondary | ICD-10-CM | POA: Diagnosis not present

## 2023-03-10 DIAGNOSIS — E441 Mild protein-calorie malnutrition: Secondary | ICD-10-CM | POA: Diagnosis not present

## 2023-03-10 DIAGNOSIS — R4189 Other symptoms and signs involving cognitive functions and awareness: Secondary | ICD-10-CM | POA: Diagnosis not present

## 2023-03-10 DIAGNOSIS — R278 Other lack of coordination: Secondary | ICD-10-CM | POA: Diagnosis not present

## 2023-03-10 DIAGNOSIS — Z741 Need for assistance with personal care: Secondary | ICD-10-CM | POA: Diagnosis not present

## 2023-03-11 DIAGNOSIS — R4189 Other symptoms and signs involving cognitive functions and awareness: Secondary | ICD-10-CM | POA: Diagnosis not present

## 2023-03-11 DIAGNOSIS — F03B18 Unspecified dementia, moderate, with other behavioral disturbance: Secondary | ICD-10-CM | POA: Diagnosis not present

## 2023-03-11 DIAGNOSIS — E441 Mild protein-calorie malnutrition: Secondary | ICD-10-CM | POA: Diagnosis not present

## 2023-03-11 DIAGNOSIS — Z741 Need for assistance with personal care: Secondary | ICD-10-CM | POA: Diagnosis not present

## 2023-03-11 DIAGNOSIS — R278 Other lack of coordination: Secondary | ICD-10-CM | POA: Diagnosis not present

## 2023-03-15 DIAGNOSIS — F03B18 Unspecified dementia, moderate, with other behavioral disturbance: Secondary | ICD-10-CM | POA: Diagnosis not present

## 2023-03-15 DIAGNOSIS — E441 Mild protein-calorie malnutrition: Secondary | ICD-10-CM | POA: Diagnosis not present

## 2023-03-15 DIAGNOSIS — R278 Other lack of coordination: Secondary | ICD-10-CM | POA: Diagnosis not present

## 2023-03-15 DIAGNOSIS — Z741 Need for assistance with personal care: Secondary | ICD-10-CM | POA: Diagnosis not present

## 2023-03-15 DIAGNOSIS — R4189 Other symptoms and signs involving cognitive functions and awareness: Secondary | ICD-10-CM | POA: Diagnosis not present

## 2023-03-18 DIAGNOSIS — Z741 Need for assistance with personal care: Secondary | ICD-10-CM | POA: Diagnosis not present

## 2023-03-18 DIAGNOSIS — R4189 Other symptoms and signs involving cognitive functions and awareness: Secondary | ICD-10-CM | POA: Diagnosis not present

## 2023-03-18 DIAGNOSIS — E441 Mild protein-calorie malnutrition: Secondary | ICD-10-CM | POA: Diagnosis not present

## 2023-03-18 DIAGNOSIS — F03B18 Unspecified dementia, moderate, with other behavioral disturbance: Secondary | ICD-10-CM | POA: Diagnosis not present

## 2023-03-18 DIAGNOSIS — R278 Other lack of coordination: Secondary | ICD-10-CM | POA: Diagnosis not present

## 2023-03-20 DIAGNOSIS — R4189 Other symptoms and signs involving cognitive functions and awareness: Secondary | ICD-10-CM | POA: Diagnosis not present

## 2023-03-20 DIAGNOSIS — Z741 Need for assistance with personal care: Secondary | ICD-10-CM | POA: Diagnosis not present

## 2023-03-20 DIAGNOSIS — F03B18 Unspecified dementia, moderate, with other behavioral disturbance: Secondary | ICD-10-CM | POA: Diagnosis not present

## 2023-03-20 DIAGNOSIS — E441 Mild protein-calorie malnutrition: Secondary | ICD-10-CM | POA: Diagnosis not present

## 2023-03-20 DIAGNOSIS — R278 Other lack of coordination: Secondary | ICD-10-CM | POA: Diagnosis not present

## 2023-03-25 DIAGNOSIS — I10 Essential (primary) hypertension: Secondary | ICD-10-CM | POA: Diagnosis not present

## 2023-03-25 LAB — CBC AND DIFFERENTIAL
HCT: 28 — AB (ref 36–46)
Hemoglobin: 8.9 — AB (ref 12.0–16.0)
Neutrophils Absolute: 4051
Platelets: 424 10*3/uL — AB (ref 150–400)
WBC: 7.6

## 2023-03-25 LAB — BASIC METABOLIC PANEL
BUN: 27 — AB (ref 4–21)
CO2: 28 — AB (ref 13–22)
Chloride: 107 (ref 99–108)
Creatinine: 0.8 (ref 0.5–1.1)
Glucose: 70
Potassium: 4 meq/L (ref 3.5–5.1)
Sodium: 141 (ref 137–147)

## 2023-03-25 LAB — COMPREHENSIVE METABOLIC PANEL
Calcium: 8.4 — AB (ref 8.7–10.7)
eGFR: 73

## 2023-03-25 LAB — CBC: RBC: 2.78 — AB (ref 3.87–5.11)

## 2023-03-27 ENCOUNTER — Encounter: Payer: Self-pay | Admitting: Student

## 2023-03-27 DIAGNOSIS — B351 Tinea unguium: Secondary | ICD-10-CM | POA: Diagnosis not present

## 2023-03-27 DIAGNOSIS — I7091 Generalized atherosclerosis: Secondary | ICD-10-CM | POA: Diagnosis not present

## 2023-03-27 NOTE — Progress Notes (Signed)
 A user error has taken place: charting done on wrong patient and has been corrected.

## 2023-03-28 DIAGNOSIS — R278 Other lack of coordination: Secondary | ICD-10-CM | POA: Diagnosis not present

## 2023-03-28 DIAGNOSIS — E441 Mild protein-calorie malnutrition: Secondary | ICD-10-CM | POA: Diagnosis not present

## 2023-03-28 DIAGNOSIS — F03B18 Unspecified dementia, moderate, with other behavioral disturbance: Secondary | ICD-10-CM | POA: Diagnosis not present

## 2023-03-28 DIAGNOSIS — R4189 Other symptoms and signs involving cognitive functions and awareness: Secondary | ICD-10-CM | POA: Diagnosis not present

## 2023-03-28 DIAGNOSIS — Z741 Need for assistance with personal care: Secondary | ICD-10-CM | POA: Diagnosis not present

## 2023-04-03 DIAGNOSIS — F03B18 Unspecified dementia, moderate, with other behavioral disturbance: Secondary | ICD-10-CM | POA: Diagnosis not present

## 2023-04-03 DIAGNOSIS — Z741 Need for assistance with personal care: Secondary | ICD-10-CM | POA: Diagnosis not present

## 2023-04-03 DIAGNOSIS — R4189 Other symptoms and signs involving cognitive functions and awareness: Secondary | ICD-10-CM | POA: Diagnosis not present

## 2023-04-03 DIAGNOSIS — R278 Other lack of coordination: Secondary | ICD-10-CM | POA: Diagnosis not present

## 2023-04-03 DIAGNOSIS — E441 Mild protein-calorie malnutrition: Secondary | ICD-10-CM | POA: Diagnosis not present

## 2023-04-16 DIAGNOSIS — F03B18 Unspecified dementia, moderate, with other behavioral disturbance: Secondary | ICD-10-CM | POA: Diagnosis not present

## 2023-04-16 DIAGNOSIS — R4189 Other symptoms and signs involving cognitive functions and awareness: Secondary | ICD-10-CM | POA: Diagnosis not present

## 2023-04-16 DIAGNOSIS — Z741 Need for assistance with personal care: Secondary | ICD-10-CM | POA: Diagnosis not present

## 2023-04-16 DIAGNOSIS — R278 Other lack of coordination: Secondary | ICD-10-CM | POA: Diagnosis not present

## 2023-04-16 DIAGNOSIS — E441 Mild protein-calorie malnutrition: Secondary | ICD-10-CM | POA: Diagnosis not present

## 2023-04-17 DIAGNOSIS — R4189 Other symptoms and signs involving cognitive functions and awareness: Secondary | ICD-10-CM | POA: Diagnosis not present

## 2023-04-17 DIAGNOSIS — F03B18 Unspecified dementia, moderate, with other behavioral disturbance: Secondary | ICD-10-CM | POA: Diagnosis not present

## 2023-04-17 DIAGNOSIS — R278 Other lack of coordination: Secondary | ICD-10-CM | POA: Diagnosis not present

## 2023-04-17 DIAGNOSIS — Z741 Need for assistance with personal care: Secondary | ICD-10-CM | POA: Diagnosis not present

## 2023-04-17 DIAGNOSIS — E441 Mild protein-calorie malnutrition: Secondary | ICD-10-CM | POA: Diagnosis not present

## 2023-04-18 DIAGNOSIS — Z741 Need for assistance with personal care: Secondary | ICD-10-CM | POA: Diagnosis not present

## 2023-04-18 DIAGNOSIS — E441 Mild protein-calorie malnutrition: Secondary | ICD-10-CM | POA: Diagnosis not present

## 2023-04-18 DIAGNOSIS — R278 Other lack of coordination: Secondary | ICD-10-CM | POA: Diagnosis not present

## 2023-04-18 DIAGNOSIS — F03B18 Unspecified dementia, moderate, with other behavioral disturbance: Secondary | ICD-10-CM | POA: Diagnosis not present

## 2023-04-18 DIAGNOSIS — R4189 Other symptoms and signs involving cognitive functions and awareness: Secondary | ICD-10-CM | POA: Diagnosis not present

## 2023-04-19 DIAGNOSIS — F03B18 Unspecified dementia, moderate, with other behavioral disturbance: Secondary | ICD-10-CM | POA: Diagnosis not present

## 2023-04-19 DIAGNOSIS — R278 Other lack of coordination: Secondary | ICD-10-CM | POA: Diagnosis not present

## 2023-04-19 DIAGNOSIS — E441 Mild protein-calorie malnutrition: Secondary | ICD-10-CM | POA: Diagnosis not present

## 2023-04-19 DIAGNOSIS — Z741 Need for assistance with personal care: Secondary | ICD-10-CM | POA: Diagnosis not present

## 2023-04-19 DIAGNOSIS — R4189 Other symptoms and signs involving cognitive functions and awareness: Secondary | ICD-10-CM | POA: Diagnosis not present

## 2023-04-22 ENCOUNTER — Encounter: Payer: Self-pay | Admitting: Student

## 2023-04-22 ENCOUNTER — Non-Acute Institutional Stay (SKILLED_NURSING_FACILITY): Payer: PRIVATE HEALTH INSURANCE | Admitting: Student

## 2023-04-22 DIAGNOSIS — Z741 Need for assistance with personal care: Secondary | ICD-10-CM | POA: Diagnosis not present

## 2023-04-22 DIAGNOSIS — R2689 Other abnormalities of gait and mobility: Secondary | ICD-10-CM | POA: Diagnosis not present

## 2023-04-22 DIAGNOSIS — E441 Mild protein-calorie malnutrition: Secondary | ICD-10-CM | POA: Diagnosis not present

## 2023-04-22 DIAGNOSIS — R2681 Unsteadiness on feet: Secondary | ICD-10-CM | POA: Diagnosis not present

## 2023-04-22 DIAGNOSIS — F03B18 Unspecified dementia, moderate, with other behavioral disturbance: Secondary | ICD-10-CM | POA: Diagnosis not present

## 2023-04-22 DIAGNOSIS — R1310 Dysphagia, unspecified: Secondary | ICD-10-CM | POA: Diagnosis not present

## 2023-04-22 DIAGNOSIS — R1312 Dysphagia, oropharyngeal phase: Secondary | ICD-10-CM | POA: Diagnosis not present

## 2023-04-22 DIAGNOSIS — F01A3 Vascular dementia, mild, with mood disturbance: Secondary | ICD-10-CM | POA: Diagnosis not present

## 2023-04-22 DIAGNOSIS — E43 Unspecified severe protein-calorie malnutrition: Secondary | ICD-10-CM

## 2023-04-22 DIAGNOSIS — R278 Other lack of coordination: Secondary | ICD-10-CM | POA: Diagnosis not present

## 2023-04-22 DIAGNOSIS — N390 Urinary tract infection, site not specified: Secondary | ICD-10-CM | POA: Diagnosis not present

## 2023-04-22 DIAGNOSIS — B9620 Unspecified Escherichia coli [E. coli] as the cause of diseases classified elsewhere: Secondary | ICD-10-CM | POA: Diagnosis not present

## 2023-04-22 DIAGNOSIS — N3281 Overactive bladder: Secondary | ICD-10-CM | POA: Diagnosis not present

## 2023-04-22 DIAGNOSIS — I69354 Hemiplegia and hemiparesis following cerebral infarction affecting left non-dominant side: Secondary | ICD-10-CM

## 2023-04-22 DIAGNOSIS — I69398 Other sequelae of cerebral infarction: Secondary | ICD-10-CM | POA: Diagnosis not present

## 2023-04-22 DIAGNOSIS — M810 Age-related osteoporosis without current pathological fracture: Secondary | ICD-10-CM | POA: Diagnosis not present

## 2023-04-22 DIAGNOSIS — F01518 Vascular dementia, unspecified severity, with other behavioral disturbance: Secondary | ICD-10-CM | POA: Diagnosis not present

## 2023-04-22 DIAGNOSIS — G629 Polyneuropathy, unspecified: Secondary | ICD-10-CM | POA: Diagnosis not present

## 2023-04-22 DIAGNOSIS — R4189 Other symptoms and signs involving cognitive functions and awareness: Secondary | ICD-10-CM | POA: Diagnosis not present

## 2023-04-22 DIAGNOSIS — F0150 Vascular dementia without behavioral disturbance: Secondary | ICD-10-CM | POA: Diagnosis not present

## 2023-04-22 DIAGNOSIS — Z9181 History of falling: Secondary | ICD-10-CM | POA: Diagnosis not present

## 2023-04-22 DIAGNOSIS — M6281 Muscle weakness (generalized): Secondary | ICD-10-CM | POA: Diagnosis not present

## 2023-04-22 DIAGNOSIS — M48061 Spinal stenosis, lumbar region without neurogenic claudication: Secondary | ICD-10-CM | POA: Diagnosis not present

## 2023-04-22 NOTE — Progress Notes (Signed)
 Location:  Other [facility]) Nursing Home Room Number: [facility]ae[facility] Offer [phone] ([place]-SNF) Place of Service:  SNF [phone]) Provider:  Earnestine Mealing, MD  Patient Care Team: Earnestine Mealing, MD as PCP - General [facility] Medicine)  Extended Emergency Contact Information Primary Emergency Contact: Mato,[name] Address: [address]          [place], [place] [address] Darden Amber of [place] Home Phone: [phone] Relation: Spouse Secondary Emergency Contact: Keepers,[name] Mobile Phone: [phone] Relation: Son  Code Status:  DNR Goals of care: Advanced Directive information    04/22/2023    9:35 AM  Advanced Directives  Does Patient Have a Medical Advance Directive? Yes  Type of Advance Directive Out of facility DNR (pink MOST or yellow form)  Does patient want to make changes to medical advance directive? No - Patient declined     Chief Complaint  Patient presents with   Medical Management of Chronic Issues    Routine Visit   Immunizations    DTAP    HPI:  Pt is a 89 y.o. female seen today for medical management of chronic diseases.   History of Present Illness The patient was seen today doe medical management of chronic conditions, with normal pressure hydrocephalus and dementia, presents for routine follow-up.  She has a history of normal pressure hydrocephalus (NPH) and dementia. Her weight has been stable for the last six months. She receives support for feeding from nursing staff and coaching. Her dementia may be contributing to cognitive decline and affecting daily functioning.  She experiences chronic dysphagia and has recently received occupational therapy to assist with feeding, including obtaining appropriate utensils. The dysphagia is likely related to her neurological conditions, including NPH and dementia.  She has been sleeping a lot, particularly through breakfast, and sometimes calls out for her husband in the afternoon, as she expects him to visit around  three.    Past Medical History:  Diagnosis Date   Arthritis    Chest pain    a. 05/2015 - post-prandial.   GERD (gastroesophageal reflux disease)    a. takes Omeprazole daily, prev seen by D. Brodie.   Heart murmur    a. 05/2013 Echo: EF 55-60%, gr2 DD, mild AI/MR.   History of blood transfusion    no abnormal reaction noted    History of migraine headaches    a. last 10/2013   Iron deficiency anemia    Joint pain    PONV (postoperative nausea and vomiting)    Rectocele 2011   Past Surgical History:  Procedure Laterality Date   cataract surgery  Bilateral    R 03/2001; L 05/2001.   COLONOSCOPY     HYSTEROSCOPY  11/1999   w/ resection of endometrial polyp by uterine curetting.   INTRAMEDULLARY (IM) NAIL INTERTROCHANTERIC Right 07/15/2020   Procedure: INTRAMEDULLARY (IM) NAIL INTERTROCHANTRIC;  Surgeon: Juanell Fairly, MD;  Location: [facility] ORS;  Service: Orthopedics;  Laterality: Right;   JOINT REPLACEMENT Bilateral R-9/04, L- 8/05   Total Knee Arthroplasties.   KYPHOPLASTY N/A 04/10/2019   Procedure: KYPHOPLASTY;  Surgeon: Kennedy Bucker, MD;  Location: [facility] ORS;  Service: Orthopedics;  Laterality: N/A;   REVERSE SHOULDER ARTHROPLASTY Right 11/26/2013   Procedure: RIGHT REVERSE SHOULDER ARTHROPLASTY;  Surgeon: Senaida Lange, MD;  Location: [facility] OR;  Service: Orthopedics;  Laterality: Right;   SHOULDER ARTHROSCOPY Right    THYROIDECTOMY  at age 70   TONSILLECTOMY      Allergies  Allergen Reactions   Sulfonamide Derivatives Hives   Sulfa  Antibiotics Other (See Comments) and Rash    Outpatient Encounter Medications as of 04/22/2023  Medication Sig   acetaminophen (TYLENOL) 500 MG tablet Take 1,000 mg by mouth 3 (three) times daily.   aspirin EC 81 MG tablet Take 81 mg by mouth daily. Swallow whole.   atorvastatin (LIPITOR) 40 MG tablet Take 1 tablet (40 mg total) by mouth daily.   bismuth subsalicylate (PEPTO BISMOL) 262 MG/15ML suspension Take 30 mLs by mouth every 4 (four)  hours as needed.   calcium carbonate (OSCAL) 1500 (600 Ca) MG TABS tablet Take 600 mg of elemental calcium by mouth daily.   cholecalciferol (VITAMIN D) 1000 units tablet Take 1,000 Units by mouth daily.   mirabegron ER (MYRBETRIQ) 25 MG TB24 tablet Take 25 mg by mouth daily.   mirtazapine (REMERON) 7.5 MG tablet Take 7.5 mg by mouth at bedtime.   polyethylene glycol (MIRALAX / GLYCOLAX) 17 g packet Take 17 g by mouth every other day.   saccharomyces boulardii (FLORASTOR) 250 MG capsule Take 250 mg by mouth daily.   UNABLE TO FIND Take 4 oz by mouth every evening. Med Name: Medpass   Zinc Oxide (TRIPLE PASTE) 12.8 % ointment Apply 1 Application topically. Every shift. (Patient not taking: Reported on 04/22/2023)   No facility-administered encounter medications on file as of 04/22/2023.    Review of Systems  Immunization History  Administered Date(s) Administered   Influenza-Unspecified 12/05/2021, 12/12/2022   Moderna Covid-19 Fall Seasonal Vaccine 22yrs & older 12/29/2021, 05/29/2022   Pneumococcal Conjugate-13 03/31/2014   Pneumococcal Polysaccharide-23 04/17/2016   Pneumococcal-Unspecified 02/19/2001   Tdap 11/13/2011   Unspecified SARS-COV-2 Vaccination 03/06/2019, 04/03/2019, 01/05/2020, 07/07/2020, 07/18/2021, 12/29/2021, 11/16/2022, 11/16/2022   Zoster Recombinant(Shingrix) 04/30/2008, 06/09/2019, 10/21/2019   Zoster, Live 04/30/2008   Pertinent  Health Maintenance Due  Topic Date Due   INFLUENZA VACCINE  Completed   DEXA SCAN  Discontinued      06/22/2021    8:36 AM 06/22/2021    1:00 PM 06/22/2021    9:40 PM 06/27/2021   11:48 PM 09/25/2022    3:48 PM  Fall Risk  Falls in the past year?     1  Was there an injury with Fall?     0  Fall Risk Category Calculator     2  (RETIRED) Patient Fall Risk Level High fall risk High fall risk High fall risk High fall risk   Patient at Risk for Falls Due to     History of fall(s);Impaired balance/gait;Impaired mobility   Functional Status  Survey:    Vitals:   04/22/23 0922  BP: 130/77  Pulse: 76  Resp: 18  Temp: 97.6 F (36.4 C)  SpO2: 97%  Weight: 114 lb 6.4 oz (51.9 kg)  Height: 5\' 5"  (1.651 m)   Body mass index is 19.04 kg/m. Physical Exam Constitutional:      Comments: Nonsensical conversation  Cardiovascular:     Rate and Rhythm: Normal rate and regular rhythm.     Pulses: Normal pulses.     Heart sounds: Normal heart sounds.  Pulmonary:     Effort: Pulmonary effort is normal.  Abdominal:     General: Abdomen is flat. Bowel sounds are normal.     Palpations: Abdomen is soft.  Musculoskeletal:        General: No swelling or tenderness.  Skin:    General: Skin is warm and dry.  Neurological:     General: No focal deficit present.     Mental Status:  She is alert. Mental status is at baseline. She is disoriented.     Labs reviewed: Recent Labs    06/22/22 1347 07/02/22 0000 10/24/22 0000 12/24/22 0000 03/25/23 0000  NA 141   < > 140 140 141  K 4.0   < > 3.9 3.8 4.0  CL 107   < > 104 107 107  CO2 26   < > 26* 28* 28*  GLUCOSE 144*  --   --   --   --   BUN 23   < > 21 21 27*  CREATININE 0.89   < > 0.8 0.8 0.8  CALCIUM 9.0   < > 8.6* 8.5* 8.4*   < > = values in this interval not displayed.   Recent Labs    06/22/22 1347 07/12/22 0000  AST 27 23  ALT 16 19  ALKPHOS 88 83  BILITOT 0.7  --   PROT 7.0  --   ALBUMIN 3.3* 3.5   Recent Labs    06/22/22 1347 07/02/22 0000 10/24/22 0000 12/24/22 0000 03/25/23 0000  WBC 9.2   < > 9.4 8.2 7.6  NEUTROABS 5.8   < > 6,345.00 5,338.00 4,051.00  HGB 10.0*   < > 9.7* 9.6* 8.9*  HCT 30.9*   < > 29* 30* 28*  MCV 101.0*  --   --   --   --   PLT 399   < > 358 382 424*   < > = values in this interval not displayed.   No results found for: "TSH" Lab Results  Component Value Date   HGBA1C 5.2 06/22/2022   Lab Results  Component Value Date   CHOL 154 06/23/2022   HDL 67 06/23/2022   LDLCALC 78 06/23/2022   TRIG 45 06/23/2022   CHOLHDL  2.3 06/23/2022    Significant Diagnostic Results in last 30 days:  No results found.  Assessment/Plan Normal Pressure Hydrocephalus (NPH) NPH with well-managed symptoms and stable weight over the last six months. - Continue supportive care  Dementia Chronic dementia with afternoon episodes of calling out for her husband and increased morning sleepiness. Daily emotional support from husband, JT, is beneficial. - Continue supportive care - continue mirtazapine for mood and dietary support  Overactive Bladder Continue myrbetriq  Chronic Dysphagia Chronic dysphagia with recent occupational therapy for appropriate utensils and feeding assistance. Receives feeding support from nursing staff. - Continue supportive care - Continue occupational therapy interventions.  Family/ staff Communication: nursing  Labs/tests ordered: quarterly labs.

## 2023-04-25 DIAGNOSIS — E43 Unspecified severe protein-calorie malnutrition: Secondary | ICD-10-CM | POA: Diagnosis not present

## 2023-04-25 DIAGNOSIS — M6281 Muscle weakness (generalized): Secondary | ICD-10-CM | POA: Diagnosis not present

## 2023-04-25 DIAGNOSIS — N390 Urinary tract infection, site not specified: Secondary | ICD-10-CM | POA: Diagnosis not present

## 2023-04-25 DIAGNOSIS — I69398 Other sequelae of cerebral infarction: Secondary | ICD-10-CM | POA: Diagnosis not present

## 2023-04-25 DIAGNOSIS — B9620 Unspecified Escherichia coli [E. coli] as the cause of diseases classified elsewhere: Secondary | ICD-10-CM | POA: Diagnosis not present

## 2023-04-25 DIAGNOSIS — F01A3 Vascular dementia, mild, with mood disturbance: Secondary | ICD-10-CM | POA: Diagnosis not present

## 2023-04-30 DIAGNOSIS — B9620 Unspecified Escherichia coli [E. coli] as the cause of diseases classified elsewhere: Secondary | ICD-10-CM | POA: Diagnosis not present

## 2023-04-30 DIAGNOSIS — E43 Unspecified severe protein-calorie malnutrition: Secondary | ICD-10-CM | POA: Diagnosis not present

## 2023-04-30 DIAGNOSIS — F01A3 Vascular dementia, mild, with mood disturbance: Secondary | ICD-10-CM | POA: Diagnosis not present

## 2023-04-30 DIAGNOSIS — I69398 Other sequelae of cerebral infarction: Secondary | ICD-10-CM | POA: Diagnosis not present

## 2023-04-30 DIAGNOSIS — M6281 Muscle weakness (generalized): Secondary | ICD-10-CM | POA: Diagnosis not present

## 2023-04-30 DIAGNOSIS — N390 Urinary tract infection, site not specified: Secondary | ICD-10-CM | POA: Diagnosis not present

## 2023-05-02 DIAGNOSIS — M6281 Muscle weakness (generalized): Secondary | ICD-10-CM | POA: Diagnosis not present

## 2023-05-02 DIAGNOSIS — E43 Unspecified severe protein-calorie malnutrition: Secondary | ICD-10-CM | POA: Diagnosis not present

## 2023-05-02 DIAGNOSIS — B9620 Unspecified Escherichia coli [E. coli] as the cause of diseases classified elsewhere: Secondary | ICD-10-CM | POA: Diagnosis not present

## 2023-05-02 DIAGNOSIS — N390 Urinary tract infection, site not specified: Secondary | ICD-10-CM | POA: Diagnosis not present

## 2023-05-02 DIAGNOSIS — I69398 Other sequelae of cerebral infarction: Secondary | ICD-10-CM | POA: Diagnosis not present

## 2023-05-02 DIAGNOSIS — F01A3 Vascular dementia, mild, with mood disturbance: Secondary | ICD-10-CM | POA: Diagnosis not present

## 2023-05-07 DIAGNOSIS — I69398 Other sequelae of cerebral infarction: Secondary | ICD-10-CM | POA: Diagnosis not present

## 2023-05-07 DIAGNOSIS — N390 Urinary tract infection, site not specified: Secondary | ICD-10-CM | POA: Diagnosis not present

## 2023-05-07 DIAGNOSIS — F01A3 Vascular dementia, mild, with mood disturbance: Secondary | ICD-10-CM | POA: Diagnosis not present

## 2023-05-07 DIAGNOSIS — E43 Unspecified severe protein-calorie malnutrition: Secondary | ICD-10-CM | POA: Diagnosis not present

## 2023-05-07 DIAGNOSIS — B9620 Unspecified Escherichia coli [E. coli] as the cause of diseases classified elsewhere: Secondary | ICD-10-CM | POA: Diagnosis not present

## 2023-05-07 DIAGNOSIS — M6281 Muscle weakness (generalized): Secondary | ICD-10-CM | POA: Diagnosis not present

## 2023-05-13 DIAGNOSIS — B9620 Unspecified Escherichia coli [E. coli] as the cause of diseases classified elsewhere: Secondary | ICD-10-CM | POA: Diagnosis not present

## 2023-05-13 DIAGNOSIS — E43 Unspecified severe protein-calorie malnutrition: Secondary | ICD-10-CM | POA: Diagnosis not present

## 2023-05-13 DIAGNOSIS — M6281 Muscle weakness (generalized): Secondary | ICD-10-CM | POA: Diagnosis not present

## 2023-05-13 DIAGNOSIS — F01A3 Vascular dementia, mild, with mood disturbance: Secondary | ICD-10-CM | POA: Diagnosis not present

## 2023-05-13 DIAGNOSIS — N390 Urinary tract infection, site not specified: Secondary | ICD-10-CM | POA: Diagnosis not present

## 2023-05-13 DIAGNOSIS — I69398 Other sequelae of cerebral infarction: Secondary | ICD-10-CM | POA: Diagnosis not present

## 2023-05-20 DIAGNOSIS — N390 Urinary tract infection, site not specified: Secondary | ICD-10-CM | POA: Diagnosis not present

## 2023-05-20 DIAGNOSIS — M6281 Muscle weakness (generalized): Secondary | ICD-10-CM | POA: Diagnosis not present

## 2023-05-20 DIAGNOSIS — F01A3 Vascular dementia, mild, with mood disturbance: Secondary | ICD-10-CM | POA: Diagnosis not present

## 2023-05-20 DIAGNOSIS — I69398 Other sequelae of cerebral infarction: Secondary | ICD-10-CM | POA: Diagnosis not present

## 2023-05-20 DIAGNOSIS — B9620 Unspecified Escherichia coli [E. coli] as the cause of diseases classified elsewhere: Secondary | ICD-10-CM | POA: Diagnosis not present

## 2023-05-20 DIAGNOSIS — E43 Unspecified severe protein-calorie malnutrition: Secondary | ICD-10-CM | POA: Diagnosis not present

## 2023-05-21 ENCOUNTER — Non-Acute Institutional Stay (SKILLED_NURSING_FACILITY): Payer: Self-pay | Admitting: Nurse Practitioner

## 2023-05-21 ENCOUNTER — Encounter: Payer: Self-pay | Admitting: Nurse Practitioner

## 2023-05-21 DIAGNOSIS — M810 Age-related osteoporosis without current pathological fracture: Secondary | ICD-10-CM

## 2023-05-21 DIAGNOSIS — D509 Iron deficiency anemia, unspecified: Secondary | ICD-10-CM

## 2023-05-21 DIAGNOSIS — F01518 Vascular dementia, unspecified severity, with other behavioral disturbance: Secondary | ICD-10-CM | POA: Diagnosis not present

## 2023-05-21 DIAGNOSIS — I69354 Hemiplegia and hemiparesis following cerebral infarction affecting left non-dominant side: Secondary | ICD-10-CM

## 2023-05-21 DIAGNOSIS — R1310 Dysphagia, unspecified: Secondary | ICD-10-CM

## 2023-05-21 DIAGNOSIS — E43 Unspecified severe protein-calorie malnutrition: Secondary | ICD-10-CM | POA: Diagnosis not present

## 2023-05-21 DIAGNOSIS — E782 Mixed hyperlipidemia: Secondary | ICD-10-CM

## 2023-05-21 DIAGNOSIS — K5904 Chronic idiopathic constipation: Secondary | ICD-10-CM | POA: Diagnosis not present

## 2023-05-21 DIAGNOSIS — N3281 Overactive bladder: Secondary | ICD-10-CM

## 2023-05-21 NOTE — Progress Notes (Unsigned)
 Location:  Other [facility]) Nursing Home Room Number: [phone] A Place of Service:  SNF (31)   K. Janyth Contes, NP    Patient Care Team: Earnestine Mealing, MD as PCP - General [facility] Medicine)  Extended Emergency Contact Information Primary Emergency Contact: Funke,[name] Address: [address]          [place], [place] [address] [place] of [place] Home Phone: [phone] Relation: Spouse Secondary Emergency Contact: Guardado,[name] Mobile Phone: [phone] Relation: Son  Goals of care: Advanced Directive information    04/22/2023    9:35 AM  Advanced Directives  Does Patient Have a Medical Advance Directive? Yes  Type of Advance Directive Out of facility DNR (pink MOST or yellow form)  Does patient want to make changes to medical advance directive? No - Patient declined     Chief Complaint  Patient presents with   Medical Management of Chronic Issues    Routine visit and discuss need for td/tdap     HPI:  Pt is a 89 y.o. female seen today for medical management of chronic disease. Pt with hx of dementia, CVA, hypertension, hyperlipidema and anemia.  She has had slow weight loss noted, staff assisting with feeding.  No behaviors noted.  Needing staff for total care.   Past Medical History:  Diagnosis Date   Arthritis    Chest pain    a. 05/2015 - post-prandial.   GERD (gastroesophageal reflux disease)    a. takes Omeprazole daily, prev seen by D. Brodie.   Heart murmur    a. 05/2013 Echo: EF 55-60%, gr2 DD, mild AI/MR.   History of blood transfusion    no abnormal reaction noted    History of migraine headaches    a. last 10/2013   Iron deficiency anemia    Joint pain    PONV (postoperative nausea and vomiting)    Rectocele 2011   Past Surgical History:  Procedure Laterality Date   cataract surgery  Bilateral    R 03/2001; L 05/2001.   COLONOSCOPY     HYSTEROSCOPY  11/1999   w/ resection of endometrial polyp by uterine curetting.   INTRAMEDULLARY (IM) NAIL  INTERTROCHANTERIC Right 07/15/2020   Procedure: INTRAMEDULLARY (IM) NAIL INTERTROCHANTRIC;  Surgeon: Juanell Fairly, MD;  Location: [facility] ORS;  Service: Orthopedics;  Laterality: Right;   JOINT REPLACEMENT Bilateral R-9/04, L- 8/05   Total Knee Arthroplasties.   KYPHOPLASTY N/A 04/10/2019   Procedure: KYPHOPLASTY;  Surgeon: Kennedy Bucker, MD;  Location: [facility] ORS;  Service: Orthopedics;  Laterality: N/A;   REVERSE SHOULDER ARTHROPLASTY Right 11/26/2013   Procedure: RIGHT REVERSE SHOULDER ARTHROPLASTY;  Surgeon: Senaida Lange, MD;  Location: [facility] OR;  Service: Orthopedics;  Laterality: Right;   SHOULDER ARTHROSCOPY Right    THYROIDECTOMY  at age 41   TONSILLECTOMY      Allergies  Allergen Reactions   Sulfonamide Derivatives Hives   Sulfa Antibiotics Other (See Comments) and Rash    Outpatient Encounter Medications as of 05/21/2023  Medication Sig   acetaminophen (TYLENOL) 500 MG tablet Take 1,000 mg by mouth 3 (three) times daily.   aspirin EC 81 MG tablet Take 81 mg by mouth daily. Swallow whole.   atorvastatin (LIPITOR) 40 MG tablet Take 1 tablet (40 mg total) by mouth daily.   bismuth subsalicylate (PEPTO BISMOL) 262 MG/15ML suspension Take 30 mLs by mouth every 4 (four) hours as needed.   calcium carbonate (OSCAL) 1500 (600 Ca) MG TABS tablet Take 600 mg of elemental calcium by mouth daily.  cholecalciferol (VITAMIN D) 1000 units tablet Take 1,000 Units by mouth daily.   mirtazapine (REMERON) 7.5 MG tablet Take 7.5 mg by mouth at bedtime.   polyethylene glycol (MIRALAX / GLYCOLAX) 17 g packet Take 17 g by mouth daily.   saccharomyces boulardii (FLORASTOR) 250 MG capsule Take 250 mg by mouth daily.   UNABLE TO FIND Take 4 oz by mouth 2 (two) times daily. Med Name: Medpass   mirabegron ER (MYRBETRIQ) 25 MG TB24 tablet Take 25 mg by mouth daily. (Patient not taking: Reported on 05/21/2023)   No facility-administered encounter medications on file as of 05/21/2023.    Review of Systems   Unable to perform ROS: Dementia     Immunization History  Administered Date(s) Administered   Influenza-Unspecified 12/05/2021, 12/12/2022   Moderna Covid-19 Fall Seasonal Vaccine 81yrs & older 12/29/2021, 05/29/2022   Pneumococcal Conjugate-13 03/31/2014   Pneumococcal Polysaccharide-23 04/17/2016   Pneumococcal-Unspecified 02/19/2001   Tdap 11/13/2011   Unspecified SARS-COV-2 Vaccination 03/06/2019, 04/03/2019, 01/05/2020, 07/07/2020, 07/18/2021, 12/29/2021, 11/16/2022, 11/16/2022   Zoster Recombinant(Shingrix) 04/30/2008, 06/09/2019, 10/21/2019   Zoster, Live 04/30/2008   Pertinent  Health Maintenance Due  Topic Date Due   INFLUENZA VACCINE  09/20/2023   DEXA SCAN  Discontinued      06/22/2021    8:36 AM 06/22/2021    1:00 PM 06/22/2021    9:40 PM 06/27/2021   11:48 PM 09/25/2022    3:48 PM  Fall Risk  Falls in the past year?     1  Was there an injury with Fall?     0  Fall Risk Category Calculator     2  (RETIRED) Patient Fall Risk Level High fall risk High fall risk High fall risk High fall risk   Patient at Risk for Falls Due to     History of fall(s);Impaired balance/gait;Impaired mobility   Functional Status Survey:    Vitals:   05/21/23 1218  BP: 112/75  Pulse: 95  SpO2: 96%  Weight: 114 lb 6.4 oz (51.9 kg)  Height: 5\' 5"  (1.651 m)   Body mass index is 19.04 kg/m. Physical Exam Constitutional:      General: She is not in acute distress.    Appearance: She is well-developed. She is not diaphoretic.  HENT:     Head: Normocephalic and atraumatic.     Mouth/Throat:     Pharynx: No oropharyngeal exudate.  Eyes:     Conjunctiva/sclera: Conjunctivae normal.     Pupils: Pupils are equal, round, and reactive to light.  Cardiovascular:     Rate and Rhythm: Normal rate and regular rhythm.     Heart sounds: Normal heart sounds.  Pulmonary:     Effort: Pulmonary effort is normal.     Breath sounds: Normal breath sounds.  Abdominal:     General: Bowel sounds are  normal.     Palpations: Abdomen is soft.  Musculoskeletal:     Cervical back: Normal range of motion and neck supple.     Right lower leg: No edema.     Left lower leg: No edema.  Skin:    General: Skin is warm and dry.  Neurological:     Mental Status: She is alert.     Motor: Weakness present.     Gait: Gait abnormal.  Psychiatric:        Cognition and Memory: Cognition is impaired. Memory is impaired. She exhibits impaired recent memory and impaired remote memory.     Labs reviewed: Recent Labs  06/22/22 1347 07/02/22 0000 10/24/22 0000 12/24/22 0000 03/25/23 0000  NA 141   < > 140 140 141  K 4.0   < > 3.9 3.8 4.0  CL 107   < > 104 107 107  CO2 26   < > 26* 28* 28*  GLUCOSE 144*  --   --   --   --   BUN 23   < > 21 21 27*  CREATININE 0.89   < > 0.8 0.8 0.8  CALCIUM 9.0   < > 8.6* 8.5* 8.4*   < > = values in this interval not displayed.   Recent Labs    06/22/22 1347 07/12/22 0000  AST 27 23  ALT 16 19  ALKPHOS 88 83  BILITOT 0.7  --   PROT 7.0  --   ALBUMIN 3.3* 3.5   Recent Labs    06/22/22 1347 07/02/22 0000 10/24/22 0000 12/24/22 0000 03/25/23 0000  WBC 9.2   < > 9.4 8.2 7.6  NEUTROABS 5.8   < > 6,345.00 5,338.00 4,051.00  HGB 10.0*   < > 9.7* 9.6* 8.9*  HCT 30.9*   < > 29* 30* 28*  MCV 101.0*  --   --   --   --   PLT 399   < > 358 382 424*   < > = values in this interval not displayed.   No results found for: "TSH" Lab Results  Component Value Date   HGBA1C 5.2 06/22/2022   Lab Results  Component Value Date   CHOL 154 06/23/2022   HDL 67 06/23/2022   LDLCALC 78 06/23/2022   TRIG 45 06/23/2022   CHOLHDL 2.3 06/23/2022    Significant Diagnostic Results in last 30 days:  No results found.  Assessment/Plan ANEMIA, IRON DEFICIENCY Following, will recheck CBC  Constipation Stable on miralax every other day.   OP (osteoporosis) Continues on calcium and vit d  Protein-calorie malnutrition, severe (HCC) Continues on supplement    Hemiparesis affecting left side as late effect of stroke (HCC) Stable, continues on ASA.   Vascular dementia with behavior disturbance (HCC) Advanced, no acute changes in cognitive or functional status, continue supportive care.    Mixed hyperlipidemia Stable, follow up lipids with next labs. Continues statin.   OAB (overactive bladder) Stable on myrbetriq  Dysphagia Continue with modifications.      Janene Harvey. [facility] [phone]

## 2023-05-24 DIAGNOSIS — N3281 Overactive bladder: Secondary | ICD-10-CM | POA: Insufficient documentation

## 2023-05-24 DIAGNOSIS — R1310 Dysphagia, unspecified: Secondary | ICD-10-CM | POA: Insufficient documentation

## 2023-05-24 DIAGNOSIS — E782 Mixed hyperlipidemia: Secondary | ICD-10-CM | POA: Insufficient documentation

## 2023-05-24 NOTE — Assessment & Plan Note (Signed)
Stable on myrbetriq

## 2023-05-24 NOTE — Assessment & Plan Note (Signed)
 Stable, follow up lipids with next labs. Continues statin.

## 2023-05-24 NOTE — Assessment & Plan Note (Signed)
Continues on supplement 

## 2023-05-24 NOTE — Assessment & Plan Note (Signed)
 Stable, continues on ASA

## 2023-05-24 NOTE — Assessment & Plan Note (Signed)
 Continue with modifications.

## 2023-05-24 NOTE — Assessment & Plan Note (Signed)
 Advanced, no acute changes in cognitive or functional status, continue supportive care.

## 2023-05-24 NOTE — Assessment & Plan Note (Signed)
 Stable on miralax every other day.

## 2023-05-24 NOTE — Assessment & Plan Note (Signed)
 Following, will recheck CBC

## 2023-05-24 NOTE — Assessment & Plan Note (Signed)
 Continues on calcium and vit d

## 2023-05-30 DIAGNOSIS — I69398 Other sequelae of cerebral infarction: Secondary | ICD-10-CM | POA: Diagnosis not present

## 2023-05-30 DIAGNOSIS — Z9181 History of falling: Secondary | ICD-10-CM | POA: Diagnosis not present

## 2023-05-30 DIAGNOSIS — R1312 Dysphagia, oropharyngeal phase: Secondary | ICD-10-CM | POA: Diagnosis not present

## 2023-05-30 DIAGNOSIS — F01A3 Vascular dementia, mild, with mood disturbance: Secondary | ICD-10-CM | POA: Diagnosis not present

## 2023-05-30 DIAGNOSIS — N3281 Overactive bladder: Secondary | ICD-10-CM | POA: Diagnosis not present

## 2023-05-30 DIAGNOSIS — G629 Polyneuropathy, unspecified: Secondary | ICD-10-CM | POA: Diagnosis not present

## 2023-05-30 DIAGNOSIS — Z741 Need for assistance with personal care: Secondary | ICD-10-CM | POA: Diagnosis not present

## 2023-05-30 DIAGNOSIS — M48061 Spinal stenosis, lumbar region without neurogenic claudication: Secondary | ICD-10-CM | POA: Diagnosis not present

## 2023-05-30 DIAGNOSIS — R278 Other lack of coordination: Secondary | ICD-10-CM | POA: Diagnosis not present

## 2023-05-30 DIAGNOSIS — E43 Unspecified severe protein-calorie malnutrition: Secondary | ICD-10-CM | POA: Diagnosis not present

## 2023-05-30 DIAGNOSIS — M6281 Muscle weakness (generalized): Secondary | ICD-10-CM | POA: Diagnosis not present

## 2023-05-30 DIAGNOSIS — B9620 Unspecified Escherichia coli [E. coli] as the cause of diseases classified elsewhere: Secondary | ICD-10-CM | POA: Diagnosis not present

## 2023-05-30 DIAGNOSIS — E441 Mild protein-calorie malnutrition: Secondary | ICD-10-CM | POA: Diagnosis not present

## 2023-05-30 DIAGNOSIS — F03B18 Unspecified dementia, moderate, with other behavioral disturbance: Secondary | ICD-10-CM | POA: Diagnosis not present

## 2023-05-30 DIAGNOSIS — R2681 Unsteadiness on feet: Secondary | ICD-10-CM | POA: Diagnosis not present

## 2023-05-30 DIAGNOSIS — R4189 Other symptoms and signs involving cognitive functions and awareness: Secondary | ICD-10-CM | POA: Diagnosis not present

## 2023-05-30 DIAGNOSIS — R2689 Other abnormalities of gait and mobility: Secondary | ICD-10-CM | POA: Diagnosis not present

## 2023-05-30 DIAGNOSIS — N390 Urinary tract infection, site not specified: Secondary | ICD-10-CM | POA: Diagnosis not present

## 2023-05-30 DIAGNOSIS — F0150 Vascular dementia without behavioral disturbance: Secondary | ICD-10-CM | POA: Diagnosis not present

## 2023-05-30 DIAGNOSIS — M810 Age-related osteoporosis without current pathological fracture: Secondary | ICD-10-CM | POA: Diagnosis not present

## 2023-05-31 DIAGNOSIS — Z23 Encounter for immunization: Secondary | ICD-10-CM | POA: Diagnosis not present

## 2023-06-03 ENCOUNTER — Non-Acute Institutional Stay (SKILLED_NURSING_FACILITY): Payer: Self-pay | Admitting: Student

## 2023-06-03 ENCOUNTER — Encounter: Payer: Self-pay | Admitting: Student

## 2023-06-03 DIAGNOSIS — Z711 Person with feared health complaint in whom no diagnosis is made: Secondary | ICD-10-CM | POA: Diagnosis not present

## 2023-06-03 DIAGNOSIS — D509 Iron deficiency anemia, unspecified: Secondary | ICD-10-CM

## 2023-06-03 NOTE — Progress Notes (Signed)
 Location:  Other [facility].  Nursing Home Room Number: [facility] [address] Place of Service:  SNF [phone]) Provider:  Valrie Gehrig, MD  Patient Care Team: Valrie Gehrig, MD as PCP - General [facility] Medicine)  Extended Emergency Contact Information Primary Emergency Contact: Senske,[name] Address: [address]          [place], [place] [address] [place] Home Phone: [phone] Relation: Spouse Secondary Emergency Contact: Ratterman,[name] Mobile Phone: [phone] Relation: Son  Code Status:  DNR Goals of care: Advanced Directive information    04/22/2023    9:35 AM  Advanced Directives  Does Patient Have a Medical Advance Directive? Yes  Type of Advance Directive Out of facility DNR (pink MOST or yellow form)  Does patient want to make changes to medical advance directive? No - Patient declined     Chief Complaint  Patient presents with   Mouth Lesions    Mouth Sore.     HPI:  Pt is a 89 y.o. female seen today for an acute visit for Mouth Sore. Patient is resting comfortably.   Nursing with concern that patient has redness in her mouth.   Past Medical History:  Diagnosis Date   Arthritis    Chest pain    a. 05/2015 - post-prandial.   GERD (gastroesophageal reflux disease)    a. takes Omeprazole  daily, prev seen by D. Brodie.   Heart murmur    a. 05/2013 Echo: EF 55-60%, gr2 DD, mild AI/MR.   History of blood transfusion    no abnormal reaction noted    History of migraine headaches    a. last 10/2013   Iron deficiency anemia    Joint pain    PONV (postoperative nausea and vomiting)    Rectocele 2011   Past Surgical History:  Procedure Laterality Date   cataract surgery  Bilateral    R 03/2001; L 05/2001.   COLONOSCOPY     HYSTEROSCOPY  11/1999   w/ resection of endometrial polyp by uterine curetting.   INTRAMEDULLARY (IM) NAIL INTERTROCHANTERIC Right 07/15/2020   Procedure: INTRAMEDULLARY (IM) NAIL INTERTROCHANTRIC;  Surgeon: Rande Bushy, MD;   Location: [facility] ORS;  Service: Orthopedics;  Laterality: Right;   JOINT REPLACEMENT Bilateral R-9/04, L- 8/05   Total Knee Arthroplasties.   KYPHOPLASTY N/A 04/10/2019   Procedure: KYPHOPLASTY;  Surgeon: Molli Angelucci, MD;  Location: [facility] ORS;  Service: Orthopedics;  Laterality: N/A;   REVERSE SHOULDER ARTHROPLASTY Right 11/26/2013   Procedure: RIGHT REVERSE SHOULDER ARTHROPLASTY;  Surgeon: Glo Larch, MD;  Location: [facility] OR;  Service: Orthopedics;  Laterality: Right;   SHOULDER ARTHROSCOPY Right    THYROIDECTOMY  at age 64   TONSILLECTOMY      Allergies  Allergen Reactions   Sulfonamide Derivatives Hives   Sulfa Antibiotics Other (See Comments) and Rash    Outpatient Encounter Medications as of 06/03/2023  Medication Sig   acetaminophen  (TYLENOL ) 500 MG tablet Take 1,000 mg by mouth 3 (three) times daily.   aspirin  EC 81 MG tablet Take 81 mg by mouth daily. Swallow whole.   atorvastatin  (LIPITOR) 40 MG tablet Take 1 tablet (40 mg total) by mouth daily.   bismuth  subsalicylate (PEPTO BISMOL) 262 MG/15ML suspension Take 30 mLs by mouth every 4 (four) hours as needed.   calcium  carbonate (OSCAL) 1500 (600 Ca) MG TABS tablet Take 600 mg of elemental calcium  by mouth daily.   cholecalciferol  (VITAMIN D ) 1000 units tablet Take 1,000 Units by mouth daily.   mirtazapine  (REMERON ) 7.5  MG tablet Take 7.5 mg by mouth at bedtime.   polyethylene glycol (MIRALAX  / GLYCOLAX ) 17 g packet Take 17 g by mouth daily.   saccharomyces boulardii (FLORASTOR) 250 MG capsule Take 250 mg by mouth daily.   UNABLE TO FIND Take 4 oz by mouth 2 (two) times daily. Med Name: Medpass   mirabegron  ER (MYRBETRIQ ) 25 MG TB24 tablet Take 25 mg by mouth daily. (Patient not taking: Reported on 05/21/2023)   No facility-administered encounter medications on file as of 06/03/2023.    Review of Systems  Immunization History  Administered Date(s) Administered   Influenza-Unspecified 12/05/2021, 12/12/2022   Moderna Covid-19  Fall Seasonal Vaccine 38yrs & older 12/29/2021, 05/29/2022   Pneumococcal Conjugate-13 03/31/2014   Pneumococcal Polysaccharide-23 04/17/2016   Pneumococcal-Unspecified 02/19/2001   Tdap 11/13/2011   Unspecified SARS-COV-2 Vaccination 03/06/2019, 04/03/2019, 01/05/2020, 07/07/2020, 07/18/2021, 12/29/2021, 11/16/2022, 11/16/2022   Zoster Recombinant(Shingrix) 04/30/2008, 06/09/2019, 10/21/2019   Zoster, Live 04/30/2008   Pertinent  Health Maintenance Due  Topic Date Due   INFLUENZA VACCINE  09/20/2023   DEXA SCAN  Discontinued      06/22/2021    8:36 AM 06/22/2021    1:00 PM 06/22/2021    9:40 PM 06/27/2021   11:48 PM 09/25/2022    3:48 PM  Fall Risk  Falls in the past year?     1  Was there an injury with Fall?     0  Fall Risk Category Calculator     2  (RETIRED) Patient Fall Risk Level High fall risk High fall risk High fall risk High fall risk   Patient at Risk for Falls Due to     History of fall(s);Impaired balance/gait;Impaired mobility   Functional Status Survey:    Vitals:   06/03/23 1023  BP: 112/75  Pulse: 95  Resp: 20  Temp: 98.2 F (36.8 C)  SpO2: 96%  Weight: 110 lb 6.4 oz (50.1 kg)  Height: 5\' 5"  (1.651 m)   Body mass index is 18.37 kg/m. Physical Exam Constitutional:      Comments: sleeping  HENT:     Mouth/Throat:     Mouth: Mucous membranes are moist.     Pharynx: No oropharyngeal exudate or posterior oropharyngeal erythema.    Labs reviewed: Recent Labs    06/22/22 1347 07/02/22 0000 10/24/22 0000 12/24/22 0000 03/25/23 0000  NA 141   < > 140 140 141  K 4.0   < > 3.9 3.8 4.0  CL 107   < > 104 107 107  CO2 26   < > 26* 28* 28*  GLUCOSE 144*  --   --   --   --   BUN 23   < > 21 21 27*  CREATININE 0.89   < > 0.8 0.8 0.8  CALCIUM  9.0   < > 8.6* 8.5* 8.4*   < > = values in this interval not displayed.   Recent Labs    06/22/22 1347 07/12/22 0000  AST 27 23  ALT 16 19  ALKPHOS 88 83  BILITOT 0.7  --   PROT 7.0  --   ALBUMIN  3.3* 3.5    Recent Labs    06/22/22 1347 07/02/22 0000 10/24/22 0000 12/24/22 0000 03/25/23 0000  WBC 9.2   < > 9.4 8.2 7.6  NEUTROABS 5.8   < > 6,345.00 5,338.00 4,051.00  HGB 10.0*   < > 9.7* 9.6* 8.9*  HCT 30.9*   < > 29* 30* 28*  MCV 101.0*  --   --   --   --  PLT 399   < > 358 382 424*   < > = values in this interval not displayed.   No results found for: "TSH" Lab Results  Component Value Date   HGBA1C 5.2 06/22/2022   Lab Results  Component Value Date   CHOL 154 06/23/2022   HDL 67 06/23/2022   LDLCALC 78 06/23/2022   TRIG 45 06/23/2022   CHOLHDL 2.3 06/23/2022    Significant Diagnostic Results in last 30 days:  No results found.  Assessment/Plan Worried well  Iron deficiency anemia, unspecified iron deficiency anemia type Nursing with concern for "petechiae" in mouth, however, none on exam. Patient with hx of anemia, will collect labs on Thursday.   Family/ staff Communication: nursing  Labs/tests ordered:  CBC

## 2023-06-06 ENCOUNTER — Inpatient Hospital Stay
Admission: EM | Admit: 2023-06-06 | Discharge: 2023-06-20 | DRG: 521 | Disposition: E | Attending: Pulmonary Disease | Admitting: Pulmonary Disease

## 2023-06-06 ENCOUNTER — Emergency Department

## 2023-06-06 ENCOUNTER — Other Ambulatory Visit: Payer: Self-pay

## 2023-06-06 DIAGNOSIS — K7200 Acute and subacute hepatic failure without coma: Secondary | ICD-10-CM | POA: Diagnosis not present

## 2023-06-06 DIAGNOSIS — M8580 Other specified disorders of bone density and structure, unspecified site: Secondary | ICD-10-CM | POA: Diagnosis not present

## 2023-06-06 DIAGNOSIS — R578 Other shock: Secondary | ICD-10-CM | POA: Diagnosis not present

## 2023-06-06 DIAGNOSIS — D649 Anemia, unspecified: Secondary | ICD-10-CM | POA: Insufficient documentation

## 2023-06-06 DIAGNOSIS — Z8249 Family history of ischemic heart disease and other diseases of the circulatory system: Secondary | ICD-10-CM

## 2023-06-06 DIAGNOSIS — E782 Mixed hyperlipidemia: Secondary | ICD-10-CM | POA: Diagnosis present

## 2023-06-06 DIAGNOSIS — S7292XA Unspecified fracture of left femur, initial encounter for closed fracture: Secondary | ICD-10-CM | POA: Diagnosis not present

## 2023-06-06 DIAGNOSIS — S72112A Displaced fracture of greater trochanter of left femur, initial encounter for closed fracture: Secondary | ICD-10-CM | POA: Diagnosis not present

## 2023-06-06 DIAGNOSIS — Z881 Allergy status to other antibiotic agents status: Secondary | ICD-10-CM

## 2023-06-06 DIAGNOSIS — R579 Shock, unspecified: Secondary | ICD-10-CM | POA: Diagnosis not present

## 2023-06-06 DIAGNOSIS — R531 Weakness: Secondary | ICD-10-CM | POA: Diagnosis present

## 2023-06-06 DIAGNOSIS — M4854XA Collapsed vertebra, not elsewhere classified, thoracic region, initial encounter for fracture: Secondary | ICD-10-CM | POA: Diagnosis not present

## 2023-06-06 DIAGNOSIS — D72829 Elevated white blood cell count, unspecified: Secondary | ICD-10-CM | POA: Diagnosis present

## 2023-06-06 DIAGNOSIS — N179 Acute kidney failure, unspecified: Secondary | ICD-10-CM | POA: Diagnosis not present

## 2023-06-06 DIAGNOSIS — Z96611 Presence of right artificial shoulder joint: Secondary | ICD-10-CM | POA: Diagnosis not present

## 2023-06-06 DIAGNOSIS — W050XXA Fall from non-moving wheelchair, initial encounter: Secondary | ICD-10-CM | POA: Diagnosis present

## 2023-06-06 DIAGNOSIS — M810 Age-related osteoporosis without current pathological fracture: Secondary | ICD-10-CM | POA: Diagnosis present

## 2023-06-06 DIAGNOSIS — S0181XA Laceration without foreign body of other part of head, initial encounter: Secondary | ICD-10-CM | POA: Diagnosis present

## 2023-06-06 DIAGNOSIS — Z515 Encounter for palliative care: Secondary | ICD-10-CM

## 2023-06-06 DIAGNOSIS — Z66 Do not resuscitate: Secondary | ICD-10-CM | POA: Diagnosis present

## 2023-06-06 DIAGNOSIS — E440 Moderate protein-calorie malnutrition: Secondary | ICD-10-CM | POA: Diagnosis not present

## 2023-06-06 DIAGNOSIS — I700 Atherosclerosis of aorta: Secondary | ICD-10-CM | POA: Diagnosis not present

## 2023-06-06 DIAGNOSIS — Z681 Body mass index (BMI) 19 or less, adult: Secondary | ICD-10-CM | POA: Diagnosis not present

## 2023-06-06 DIAGNOSIS — Z471 Aftercare following joint replacement surgery: Secondary | ICD-10-CM | POA: Diagnosis not present

## 2023-06-06 DIAGNOSIS — M79602 Pain in left arm: Secondary | ICD-10-CM | POA: Diagnosis not present

## 2023-06-06 DIAGNOSIS — Z96642 Presence of left artificial hip joint: Secondary | ICD-10-CM | POA: Diagnosis not present

## 2023-06-06 DIAGNOSIS — Z96653 Presence of artificial knee joint, bilateral: Secondary | ICD-10-CM | POA: Diagnosis present

## 2023-06-06 DIAGNOSIS — K921 Melena: Secondary | ICD-10-CM | POA: Diagnosis not present

## 2023-06-06 DIAGNOSIS — R0989 Other specified symptoms and signs involving the circulatory and respiratory systems: Secondary | ICD-10-CM | POA: Diagnosis not present

## 2023-06-06 DIAGNOSIS — K922 Gastrointestinal hemorrhage, unspecified: Secondary | ICD-10-CM | POA: Insufficient documentation

## 2023-06-06 DIAGNOSIS — R1310 Dysphagia, unspecified: Secondary | ICD-10-CM | POA: Diagnosis present

## 2023-06-06 DIAGNOSIS — E890 Postprocedural hypothyroidism: Secondary | ICD-10-CM | POA: Diagnosis not present

## 2023-06-06 DIAGNOSIS — S72002A Fracture of unspecified part of neck of left femur, initial encounter for closed fracture: Secondary | ICD-10-CM | POA: Diagnosis present

## 2023-06-06 DIAGNOSIS — K219 Gastro-esophageal reflux disease without esophagitis: Secondary | ICD-10-CM | POA: Diagnosis not present

## 2023-06-06 DIAGNOSIS — Z79899 Other long term (current) drug therapy: Secondary | ICD-10-CM

## 2023-06-06 DIAGNOSIS — E875 Hyperkalemia: Secondary | ICD-10-CM | POA: Insufficient documentation

## 2023-06-06 DIAGNOSIS — I959 Hypotension, unspecified: Secondary | ICD-10-CM | POA: Diagnosis not present

## 2023-06-06 DIAGNOSIS — R9082 White matter disease, unspecified: Secondary | ICD-10-CM | POA: Diagnosis not present

## 2023-06-06 DIAGNOSIS — M1990 Unspecified osteoarthritis, unspecified site: Secondary | ICD-10-CM | POA: Diagnosis not present

## 2023-06-06 DIAGNOSIS — I701 Atherosclerosis of renal artery: Secondary | ICD-10-CM | POA: Diagnosis not present

## 2023-06-06 DIAGNOSIS — Z993 Dependence on wheelchair: Secondary | ICD-10-CM

## 2023-06-06 DIAGNOSIS — S01412A Laceration without foreign body of left cheek and temporomandibular area, initial encounter: Secondary | ICD-10-CM | POA: Diagnosis not present

## 2023-06-06 DIAGNOSIS — M4856XA Collapsed vertebra, not elsewhere classified, lumbar region, initial encounter for fracture: Secondary | ICD-10-CM | POA: Diagnosis not present

## 2023-06-06 DIAGNOSIS — K5791 Diverticulosis of intestine, part unspecified, without perforation or abscess with bleeding: Secondary | ICD-10-CM | POA: Diagnosis not present

## 2023-06-06 DIAGNOSIS — D62 Acute posthemorrhagic anemia: Secondary | ICD-10-CM | POA: Diagnosis not present

## 2023-06-06 DIAGNOSIS — W19XXXA Unspecified fall, initial encounter: Secondary | ICD-10-CM | POA: Diagnosis not present

## 2023-06-06 DIAGNOSIS — Z01818 Encounter for other preprocedural examination: Secondary | ICD-10-CM | POA: Diagnosis not present

## 2023-06-06 DIAGNOSIS — E43 Unspecified severe protein-calorie malnutrition: Secondary | ICD-10-CM | POA: Diagnosis present

## 2023-06-06 DIAGNOSIS — F32A Depression, unspecified: Secondary | ICD-10-CM | POA: Diagnosis present

## 2023-06-06 DIAGNOSIS — J90 Pleural effusion, not elsewhere classified: Secondary | ICD-10-CM | POA: Diagnosis not present

## 2023-06-06 DIAGNOSIS — F01518 Vascular dementia, unspecified severity, with other behavioral disturbance: Secondary | ICD-10-CM | POA: Diagnosis not present

## 2023-06-06 DIAGNOSIS — R54 Age-related physical debility: Secondary | ICD-10-CM | POA: Diagnosis not present

## 2023-06-06 DIAGNOSIS — K5900 Constipation, unspecified: Secondary | ICD-10-CM | POA: Diagnosis not present

## 2023-06-06 DIAGNOSIS — S0990XA Unspecified injury of head, initial encounter: Principal | ICD-10-CM | POA: Diagnosis present

## 2023-06-06 DIAGNOSIS — S72002D Fracture of unspecified part of neck of left femur, subsequent encounter for closed fracture with routine healing: Secondary | ICD-10-CM | POA: Diagnosis not present

## 2023-06-06 DIAGNOSIS — Z7982 Long term (current) use of aspirin: Secondary | ICD-10-CM

## 2023-06-06 DIAGNOSIS — Z8673 Personal history of transient ischemic attack (TIA), and cerebral infarction without residual deficits: Secondary | ICD-10-CM

## 2023-06-06 DIAGNOSIS — E872 Acidosis, unspecified: Secondary | ICD-10-CM | POA: Diagnosis not present

## 2023-06-06 DIAGNOSIS — Z882 Allergy status to sulfonamides status: Secondary | ICD-10-CM

## 2023-06-06 DIAGNOSIS — Z043 Encounter for examination and observation following other accident: Secondary | ICD-10-CM | POA: Diagnosis not present

## 2023-06-06 DIAGNOSIS — J9811 Atelectasis: Secondary | ICD-10-CM | POA: Diagnosis not present

## 2023-06-06 DIAGNOSIS — M5030 Other cervical disc degeneration, unspecified cervical region: Secondary | ICD-10-CM | POA: Diagnosis not present

## 2023-06-06 DIAGNOSIS — I10 Essential (primary) hypertension: Secondary | ICD-10-CM | POA: Diagnosis not present

## 2023-06-06 LAB — COMPREHENSIVE METABOLIC PANEL WITH GFR
ALT: 23 U/L (ref 0–44)
AST: 28 U/L (ref 15–41)
Albumin: 2.8 g/dL — ABNORMAL LOW (ref 3.5–5.0)
Alkaline Phosphatase: 69 U/L (ref 38–126)
Anion gap: 8 (ref 5–15)
BUN: 21 mg/dL (ref 8–23)
CO2: 23 mmol/L (ref 22–32)
Calcium: 8.1 mg/dL — ABNORMAL LOW (ref 8.9–10.3)
Chloride: 107 mmol/L (ref 98–111)
Creatinine, Ser: 0.69 mg/dL (ref 0.44–1.00)
GFR, Estimated: >60 mL/min (ref >60)
Glucose, Bld: 109 mg/dL — ABNORMAL HIGH (ref 70–99)
Potassium: 3.8 mmol/L (ref 3.5–5.1)
Sodium: 138 mmol/L (ref 135–145)
Total Bilirubin: 1 mg/dL (ref 0.0–1.2)
Total Protein: 5.9 g/dL — ABNORMAL LOW (ref 6.5–8.1)

## 2023-06-06 LAB — CBC WITH DIFFERENTIAL/PLATELET
Abs Immature Granulocytes: 0.13 10*3/uL — ABNORMAL HIGH (ref 0.00–0.07)
Basophils Absolute: 0.1 10*3/uL (ref 0.0–0.1)
Basophils Relative: 0 %
Eosinophils Absolute: 0 10*3/uL (ref 0.0–0.5)
Eosinophils Relative: 0 %
HCT: 25.8 % — ABNORMAL LOW (ref 36.0–46.0)
Hemoglobin: 8.5 g/dL — ABNORMAL LOW (ref 12.0–15.0)
Immature Granulocytes: 1 %
Lymphocytes Relative: 4 %
Lymphs Abs: 0.8 10*3/uL (ref 0.7–4.0)
MCH: 32.8 pg (ref 26.0–34.0)
MCHC: 32.9 g/dL (ref 30.0–36.0)
MCV: 99.6 fL (ref 80.0–100.0)
Monocytes Absolute: 1 10*3/uL (ref 0.1–1.0)
Monocytes Relative: 5 %
Neutro Abs: 17.3 10*3/uL — ABNORMAL HIGH (ref 1.7–7.7)
Neutrophils Relative %: 90 %
Platelets: 315 10*3/uL (ref 150–400)
RBC: 2.59 MIL/uL — ABNORMAL LOW (ref 3.87–5.11)
RDW: 19.3 % — ABNORMAL HIGH (ref 11.5–15.5)
WBC: 19.3 10*3/uL — ABNORMAL HIGH (ref 4.0–10.5)
nRBC: 0 % (ref 0.0–0.2)

## 2023-06-06 LAB — LACTIC ACID, PLASMA
Lactic Acid, Venous: 1.1 mmol/L (ref 0.5–1.9)
Lactic Acid, Venous: 1 mmol/L (ref 0.5–1.9)

## 2023-06-06 MED ORDER — SACCHAROMYCES BOULARDII 250 MG PO CAPS
250.0000 mg | ORAL_CAPSULE | Freq: Every day | ORAL | Status: DC
  Administered 2023-06-08 – 2023-06-10 (×3): 250 mg via ORAL
  Filled 2023-06-06 (×6): qty 1

## 2023-06-06 MED ORDER — ACETAMINOPHEN 325 MG PO TABS
650.0000 mg | ORAL_TABLET | Freq: Four times a day (QID) | ORAL | Status: DC | PRN
Start: 2023-06-06 — End: 2023-06-09

## 2023-06-06 MED ORDER — DOCUSATE SODIUM 100 MG PO CAPS
100.0000 mg | ORAL_CAPSULE | Freq: Two times a day (BID) | ORAL | Status: DC
  Administered 2023-06-06 – 2023-06-08 (×3): 100 mg via ORAL
  Filled 2023-06-06 (×4): qty 1

## 2023-06-06 MED ORDER — HYDROCODONE-ACETAMINOPHEN 5-325 MG PO TABS
1.0000 | ORAL_TABLET | Freq: Four times a day (QID) | ORAL | Status: AC | PRN
  Filled 2023-06-06: qty 1

## 2023-06-06 MED ORDER — CEFAZOLIN SODIUM-DEXTROSE 2-4 GM/100ML-% IV SOLN
2.0000 g | Freq: Once | INTRAVENOUS | Status: AC
  Administered 2023-06-07: 2 g via INTRAVENOUS
  Filled 2023-06-06: qty 100

## 2023-06-06 MED ORDER — MORPHINE SULFATE (PF) 2 MG/ML IV SOLN
2.0000 mg | Freq: Once | INTRAVENOUS | Status: AC
  Administered 2023-06-06: 2 mg via INTRAVENOUS
  Filled 2023-06-06: qty 1

## 2023-06-06 MED ORDER — POLYETHYLENE GLYCOL 3350 17 G PO PACK
17.0000 g | PACK | Freq: Every day | ORAL | Status: DC
  Administered 2023-06-08: 17 g via ORAL
  Filled 2023-06-06: qty 1

## 2023-06-06 MED ORDER — VITAMIN D 25 MCG (1000 UNIT) PO TABS
1000.0000 [IU] | ORAL_TABLET | Freq: Every day | ORAL | Status: DC
  Administered 2023-06-08 – 2023-06-10 (×3): 1000 [IU] via ORAL
  Filled 2023-06-06 (×3): qty 1

## 2023-06-06 MED ORDER — MIRTAZAPINE 15 MG PO TABS
7.5000 mg | ORAL_TABLET | Freq: Every day | ORAL | Status: DC
  Administered 2023-06-06 – 2023-06-10 (×4): 7.5 mg via ORAL
  Filled 2023-06-06 (×5): qty 1

## 2023-06-06 MED ORDER — ATORVASTATIN CALCIUM 20 MG PO TABS
40.0000 mg | ORAL_TABLET | Freq: Every day | ORAL | Status: DC
  Administered 2023-06-08 – 2023-06-10 (×3): 40 mg via ORAL
  Filled 2023-06-06 (×3): qty 2

## 2023-06-06 MED ORDER — MORPHINE SULFATE (PF) 2 MG/ML IV SOLN: 0.5000 mg | INTRAVENOUS | Status: AC | PRN

## 2023-06-06 MED ORDER — CALCIUM CARBONATE 1250 (500 CA) MG PO TABS
1.0000 | ORAL_TABLET | Freq: Every day | ORAL | Status: DC
  Administered 2023-06-08 – 2023-06-10 (×3): 1250 mg via ORAL
  Filled 2023-06-06 (×5): qty 1

## 2023-06-06 MED ORDER — ONDANSETRON HCL 4 MG/2ML IJ SOLN: 4.0000 mg | Freq: Four times a day (QID) | INTRAMUSCULAR | Status: DC | PRN

## 2023-06-06 NOTE — Assessment & Plan Note (Signed)
 Home mirtazapine 7.5 mg nightly resumed

## 2023-06-06 NOTE — Plan of Care (Signed)
  Problem: Education: Goal: Knowledge of General Education information will improve Description: Including pain rating scale, medication(s)/side effects and non-pharmacologic comfort measures Outcome: Progressing   Problem: Nutrition: Goal: Adequate nutrition will be maintained Outcome: Progressing   Problem: Elimination: Goal: Will not experience complications related to bowel motility Outcome: Progressing   Problem: Pain Managment: Goal: General experience of comfort will improve and/or be controlled Outcome: Progressing   Problem: Safety: Goal: Ability to remain free from injury will improve Outcome: Progressing   Problem: Skin Integrity: Goal: Risk for impaired skin integrity will decrease Outcome: Progressing

## 2023-06-06 NOTE — Assessment & Plan Note (Signed)
-   Atorvastatin 40 mg daily resumed 

## 2023-06-06 NOTE — Assessment & Plan Note (Addendum)
 Hydrocodone-acetaminophen 5-325 mg q6h prn moderate pain, 2 days; morphine 0.5 mg q3h prn for severe pain, 1 day ordered N.p.o. after midnight

## 2023-06-06 NOTE — Assessment & Plan Note (Signed)
-   Appears to be at patient's baseline ?

## 2023-06-06 NOTE — ED Notes (Signed)
 Pt placed on 3L O2 due to oxygen saturation dropping to 87% when sleeping

## 2023-06-06 NOTE — ED Provider Notes (Signed)
 Procedures     ----------------------------------------- 4:09 PM on 06/06/2023 -----------------------------------------  XR hips shows L fem. Neck fx. D/w ortho Dr. Lydia Sams who will plan for L hip repair tomorrow.   PMH significant for iron deficiency anemia, GERD, depression, migraine headache, rectocele, wheelchair-bound at baseline. 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  On ASA 81. No anticoagulation.   Will admit to hospitalist for medical management.    Jacquie Maudlin, MD 06/06/23 [phone]

## 2023-06-06 NOTE — Discharge Instructions (Addendum)
 You were seen in the emergency department today after a head injury. Fortunately, your exam and CT scan were overall reassuring. It is still possible that you have a concussion. I have included more information about this in your paperwork. Please follow-up with your primary care doctor within a few days for reevaluation. Return to the ER for any worsening symptoms including worsening headache, confusion, or any other new or concerning symptoms.    POSTERIOR TOTAL HIP REPLACEMENT POSTOPERATIVE DIRECTIONS  Hip Rehabilitation, Guidelines Following Surgery  The results of a hip operation are greatly improved after range of motion and muscle strengthening exercises. Follow all safety measures which are given to protect your hip. If any of these exercises cause increased pain or swelling in your joint, decrease the amount until you are comfortable again. Then slowly increase the exercises. Call your caregiver if you have problems or questions.   HOME CARE INSTRUCTIONS  Remove items at home which could result in a fall. This includes throw rugs or furniture in walking pathways.  ICE to the affected hip every three hours for 30 minutes at a time and then as needed for pain and swelling.  Continue to use ice on the hip for pain and swelling from surgery. You may notice swelling that will progress down to the foot and ankle.  This is normal after surgery.  Elevate the leg when you are not up walking on it.   Continue to use the breathing machine which will help keep your temperature down.  It is common for your temperature to cycle up and down following surgery, especially at night when you are not up moving around and exerting yourself.  The breathing machine keeps your lungs expanded and your temperature down.  DIET You may resume your previous home diet once your are discharged from the hospital.  DRESSING / WOUND CARE / SHOWERING You may change your dressing 3-5 days after surgery.  Then change the  dressing every day with sterile gauze.  Please use good hand washing techniques before changing the dressing.  Do not use any lotions or creams on the incision until instructed by your surgeon. You need to keep your dressing dry after discharge.   Change the surgical dressing if needed with Physical Therapy and reapply a dry dressing each time.    ACTIVITY Walk with your walker as instructed. Use walker as long as suggested by your caregivers. Avoid periods of inactivity such as sitting longer than an hour when not asleep. This helps prevent blood clots.  You may resume a sexual relationship in one month or when given the OK by your doctor.  You may return to work once you are cleared by your doctor.  Do not drive a car for 6 weeks or until released by you surgeon.  Do not drive while taking narcotics.  WEIGHT BEARING Weight bearing as tolerated with assist device (walker, cane, etc) as directed, use it as long as suggested by your surgeon or therapist, typically at least 4-6 weeks.  POSTOPERATIVE CONSTIPATION PROTOCOL Constipation - defined medically as fewer than three stools per week and severe constipation as less than one stool per week.  One of the most common issues patients have following surgery is constipation.  Even if you have a regular bowel pattern at home, your normal regimen is likely to be disrupted due to multiple reasons following surgery.  Combination of anesthesia, postoperative narcotics, change in appetite and fluid intake all can affect your bowels.  In order  to avoid complications following surgery, here are some recommendations in order to help you during your recovery period.  Colace (docusate) - Pick up an over-the-counter form of Colace or another stool softener and take twice a day as long as you are requiring postoperative pain medications.  Take with a full glass of water  daily.  If you experience loose stools or diarrhea, hold the colace until you stool forms  back up.  If your symptoms do not get better within 1 week or if they get worse, check with your doctor.  Dulcolax (bisacodyl ) - Pick up over-the-counter and take as directed by the product packaging as needed to assist with the movement of your bowels.  Take with a full glass of water .  Use this product as needed if not relieved by Colace only.   MiraLax  (polyethylene glycol) - Pick up over-the-counter to have on hand.  MiraLax  is a solution that will increase the amount of water  in your bowels to assist with bowel movements.  Take as directed and can mix with a glass of water , juice, soda, coffee, or tea.  Take if you go more than two days without a movement. Do not use MiraLax  more than once per day. Call your doctor if you are still constipated or irregular after using this medication for 7 days in a row.  If you continue to have problems with postoperative constipation, please contact the office for further assistance and recommendations.  If you experience "the worst abdominal pain ever" or develop nausea or vomiting, please contact the office immediatly for further recommendations for treatment.  ITCHING  If you experience itching with your medications, try taking only a single pain pill, or even half a pain pill at a time.  You can also use Benadryl  over the counter for itching or also to help with sleep.   TED HOSE STOCKINGS Wear the elastic stockings on both legs for three weeks following surgery during the day but you may remove then at night for sleeping.  MEDICATIONS See your medication summary on the "After Visit Summary" that the nursing staff will review with you prior to discharge.  You may have some home medications which will be placed on hold until you complete the course of blood thinner medication.  It is important for you to complete the blood thinner medication as prescribed by your surgeon.  Continue your approved medications as instructed at time of  discharge.  PRECAUTIONS If you experience chest pain or shortness of breath - call 911 immediately for transfer to the hospital emergency department.  If you develop a fever greater that 101 F, purulent drainage from wound, increased redness or drainage from wound, foul odor from the wound/dressing, or calf pain - CONTACT YOUR SURGEON.                                                   FOLLOW-UP APPOINTMENTS Make sure you keep all of your appointments after your operation with your surgeon and caregivers. You should call the office at the above phone number and make an appointment for approximately two weeks after the date of your surgery or on the date instructed by your surgeon outlined in the "After Visit Summary".  RANGE OF MOTION AND STRENGTHENING EXERCISES  These exercises are designed to help you keep full movement of your hip joint.  Follow your caregiver's or physical therapist's instructions. Perform all exercises about fifteen times, three times per day or as directed. Exercise both hips, even if you have had only one joint replacement. These exercises can be done on a training (exercise) mat, on the floor, on a table or on a bed. Use whatever works the best and is most comfortable for you. Use music or television while you are exercising so that the exercises are a pleasant break in your day. This will make your life better with the exercises acting as a break in routine you can look forward to.  Lying on your back, slowly slide your foot toward your buttocks, raising your knee up off the floor. Then slowly slide your foot back down until your leg is straight again.  Lying on your back spread your legs as far apart as you can without causing discomfort.  Lying on your side, raise your upper leg and foot straight up from the floor as far as is comfortable. Slowly lower the leg and repeat.  Lying on your back, tighten up the muscle in the front of your thigh (quadriceps muscles). You can do  this by keeping your leg straight and trying to raise your heel off the floor. This helps strengthen the largest muscle supporting your knee.  Lying on your back, tighten up the muscles of your buttocks both with the legs straight and with the knee bent at a comfortable angle while keeping your heel on the floor.      IF YOU ARE TRANSFERRED TO A SKILLED REHAB FACILITY If the patient is transferred to a skilled rehab facility following release from the hospital, a list of the current medications will be sent to the facility for the patient to continue.  When discharged from the skilled rehab facility, please have the facility set up the patient's Home Health Physical Therapy prior to being released. Also, the skilled facility will be responsible for providing the patient with their medications at time of release from the facility to include their pain medication, the muscle relaxants, and their blood thinner medication. If the patient is still at the rehab facility at time of the two week follow up appointment, the skilled rehab facility will also need to assist the patient in arranging follow up appointment in our office and any transportation needs.  MAKE SURE YOU:  Understand these instructions.  Get help right away if you are not doing well or get worse.    Pick up stool softner and laxative for home use following surgery while on pain medications. Do not submerge incision under water . Please use good hand washing techniques while changing dressing each day. May shower starting three days after surgery. Please use a clean towel to pat the incision dry following showers. Continue to use ice for pain and swelling after surgery. Do not use any lotions or creams on the incision until instructed by your surgeon.

## 2023-06-06 NOTE — Progress Notes (Signed)
Full consult note and discussion with patient to follow tomorrow.  Called by ED staff. Imaging reviewed.  - Plan for surgery tomorrow, likely afternoon.  - NPO after midnight - Hold anticoagulation - Admit to Hospitalist team.  

## 2023-06-06 NOTE — ED Triage Notes (Signed)
 Pt presents to the ED via [facility] from [facility]. Pt had a witnessed fall today. Staff was assisting patient in standing with a mechanical chair and pt slipped and slid down hitting her head on the side table. No LOC and no use of blood thinners. Pt's husband at bedside. Pt has a hx of dementia. Husband said that patient is at baseline and usually sleeps most of the day. Pt continuing to fall asleep at time of triage.  BP 115/80

## 2023-06-06 NOTE — H&P (Signed)
 History and Physical   Melissa [name] ZOX:[id number] DOB: 11/01/1928 DOA: 06/06/2023  PCP: Earnestine Mealing, MD Patient coming from: Assisted living facility  I have personally briefly reviewed patient's old medical records in [facility] EMR.  Chief Concern: Witnessed fall  HPI: Ms. Melissa [name] is a 89 year old female with history of dementia, iron deficiency anemia, depression, GERD, history of chronic compression fracture, who presents emergency department for chief concerns of a witnessed fall at facility.  Vitals in the ED showed temperature of 97.7, respiration rate 18, heart rate 65, blood pressure 99/51, SpO2 of 94% on room air.  Serum sodium 138, potassium 2.8, chloride 107, bicarb 23, BUN of 21, serum creatinine of 0.69, EGFR greater than 60, nonfasting blood glucose 109.  WBC was elevated at 19.3, hemoglobin 8.5, platelets of 315.  ED treatment: Morphine 2 mg IV one-time dose.  EDP consulted orthopedic surgery who states that the patient will be taken to the OR tomorrow.  N.p.o. after midnight. --------------------------------------- At bedside, patient patient understands her name and turns towards you.  Patient was sleeping when I came into the room.  She is not able to tell me her age, location, current calendar year.  She states her name is Melissa [name].  Social history: She lives at [facility]  Her husband of nearly 75 years also lives in [place].  Her husband is 74 years old and he states that he will come to see her around 9:00 tomorrow and hopefully speak to the doctor tomorrow.  ROS: Unable to complete due to patient with baseline dementia  ED Course: Discussed with EDP, patient requiring hospitalization for chief concerns of left proximal femur fracture.  Assessment/Plan  Principal Problem:   Closed left femoral fracture (HCC) Active Problems:   Depression   Protein-calorie malnutrition, severe (HCC)   GERD   Constipation   OP (osteoporosis)   Vascular  dementia with behavior disturbance (HCC)   Mixed hyperlipidemia   Assessment and Plan:  * Closed left femoral fracture (HCC) Hydrocodone-acetaminophen 5-325 mg q6h prn moderate pain, 2 days; morphine 0.5 mg q3h prn for severe pain, 1 day ordered N.p.o. after midnight  Depression Home mirtazapine 7.5 mg nightly resumed  Mixed hyperlipidemia Atorvastatin 40 mg daily resumed  Vascular dementia with behavior disturbance (HCC) Appears to be at patient's baseline  OP (osteoporosis) Calcium, vitamin D supplementation resumed  Constipation Home MiraLAX daily resumed Docusate sodium, twice daily ordered on admission  Chart reviewed.   DVT prophylaxis: ted hose.  AM team to initiate pharmacologic DVT prophylaxis when the benefits outweigh the risk Code Status: DNR/DNI, ACP reviewed Diet: Heart healthy now; n.p.o. after midnight Family Communication: Updated  Disposition Plan: Pending clinical course Consults called: Orthopedic service Admission status: Inpatient, MedSurg  Past Medical History:  Diagnosis Date   Arthritis    Chest pain    a. 05/2015 - post-prandial.   GERD (gastroesophageal reflux disease)    a. takes Omeprazole daily, prev seen by D. Brodie.   Heart murmur    a. 05/2013 Echo: EF 55-60%, gr2 DD, mild AI/MR.   History of blood transfusion    no abnormal reaction noted    History of migraine headaches    a. last 10/2013   Iron deficiency anemia    Joint pain    PONV (postoperative nausea and vomiting)    Rectocele 2011   Past Surgical History:  Procedure Laterality Date   cataract surgery  Bilateral    R 03/2001; L 05/2001.   COLONOSCOPY  HYSTEROSCOPY  11/1999   w/ resection of endometrial polyp by uterine curetting.   INTRAMEDULLARY (IM) NAIL INTERTROCHANTERIC Right 07/15/2020   Procedure: INTRAMEDULLARY (IM) NAIL INTERTROCHANTRIC;  Surgeon: Juanell Fairly, MD;  Location: [facility] ORS;  Service: Orthopedics;  Laterality: Right;   JOINT REPLACEMENT  Bilateral R-9/04, L- 8/05   Total Knee Arthroplasties.   KYPHOPLASTY N/A 04/10/2019   Procedure: KYPHOPLASTY;  Surgeon: Kennedy Bucker, MD;  Location: [facility] ORS;  Service: Orthopedics;  Laterality: N/A;   REVERSE SHOULDER ARTHROPLASTY Right 11/26/2013   Procedure: RIGHT REVERSE SHOULDER ARTHROPLASTY;  Surgeon: Senaida Lange, MD;  Location: [facility] OR;  Service: Orthopedics;  Laterality: Right;   SHOULDER ARTHROSCOPY Right    THYROIDECTOMY  at age 67   TONSILLECTOMY     Social History:  reports that she has never smoked. She has never used smokeless tobacco. She reports that she does not drink alcohol and does not use drugs.  Allergies  Allergen Reactions   Sulfonamide Derivatives Hives   Sulfa Antibiotics Other (See Comments) and Rash   Family History  Problem Relation Age of Onset   Heart attack Mother        died @ 29   Heart attack Brother        died @ 23   Heart attack Brother        died @ 76   Other Father        died suddenly @ 94.   Family history: Family history reviewed and not pertinent.  Prior to Admission medications   Medication Sig Start Date End Date Taking? Authorizing Provider  acetaminophen (TYLENOL) 500 MG tablet Take 1,000 mg by mouth 3 (three) times daily.    [provider]  aspirin EC 81 MG tablet Take 81 mg by mouth daily. Swallow whole.    [provider]  atorvastatin (LIPITOR) 40 MG tablet Take 1 tablet (40 mg total) by mouth daily. 06/26/22   Arnetha Courser, MD  bismuth subsalicylate (PEPTO BISMOL) 262 MG/15ML suspension Take 30 mLs by mouth every 4 (four) hours as needed.    [provider]  calcium carbonate (OSCAL) 1500 (600 Ca) MG TABS tablet Take 600 mg of elemental calcium by mouth daily.    [provider]  cholecalciferol (VITAMIN D) 1000 units tablet Take 1,000 Units by mouth daily.    [provider]  mirabegron ER (MYRBETRIQ) 25 MG TB24 tablet Take 25 mg by mouth daily. Patient not taking: Reported on  05/21/2023    [provider]  mirtazapine (REMERON) 7.5 MG tablet Take 7.5 mg by mouth at bedtime.    [provider]  polyethylene glycol (MIRALAX / GLYCOLAX) 17 g packet Take 17 g by mouth daily.    [provider]  saccharomyces boulardii (FLORASTOR) 250 MG capsule Take 250 mg by mouth daily.    [provider]  UNABLE TO FIND Take 4 oz by mouth 2 (two) times daily. Med Name: Medpass    [provider]   Physical Exam: Vitals:   06/06/23 1635 06/06/23 1710 06/06/23 1927 06/06/23 1927  BP: 134/68 (!) 134/90 (!) 139/58   Pulse: 91 91 92 88  Resp: 20 18 16    Temp:  98.2 F (36.8 C) 98.8 F (37.1 C)   TempSrc:  Oral    SpO2: 100% 97% 96% 96%  Weight:      Height:       Constitutional: appears frail, weak Eyes: PERRL, lids and conjunctivae normal ENMT: Mucous membranes are  moist. Posterior pharynx clear of any exudate or lesions. Age-appropriate dentition. Hearing appropriate Neck: normal, supple, no masses, no thyromegaly Respiratory: clear to auscultation bilaterally, no wheezing, no crackles. Normal respiratory effort. No accessory muscle use.  Cardiovascular: Regular rate and rhythm, no murmurs / rubs / gallops. No extremity edema. 2+ pedal pulses. No carotid bruits.  Abdomen: no tenderness, no masses palpated, no hepatosplenomegaly. Bowel sounds positive.  Musculoskeletal: no clubbing / cyanosis. No joint deformity upper and lower extremities. Good ROM, no contractures, no atrophy. Normal muscle tone.  Skin: no rashes, lesions, ulcers. No induration Neurologic: Sensation intact. Strength 5/5 in all 4.  Psychiatric: Unable to assess judgment, insight, mood. Alert and oriented x 2 self.  EKG: independently reviewed, showing sinus rhythm with rate of 87, QTc 474  x-ray on Admission: I personally reviewed and I agree with radiologist reading as below.  DG Chest Portable 1 View Result Date: 06/06/2023 CLINICAL DATA:  Fall.  Left hip  fracture.  Preop exam. EXAM: PORTABLE CHEST 1 VIEW COMPARISON:  06/22/2021 radiograph FINDINGS: Stable cardiomegaly. Aortic atherosclerotic calcification. Low lung volumes accentuate pulmonary vascularity. Bibasilar atelectasis. No pleural effusion or pneumothorax. No displaced rib fractures. Nodular opacity projecting over the right lower lung is favored osseous in nature possibly related to remote rib fracture. Right reverse TSA. L1 vertebroplasty. IMPRESSION: Low lung volumes with bibasilar atelectasis. Electronically Signed   By: Rozell Cornet M.D.   On: 06/06/2023 18:54   DG HIPS BILAT WITH PELVIS MIN 5 VIEWS Result Date: 06/06/2023 CLINICAL DATA:  Witnessed fall EXAM: DG HIP (WITH OR WITHOUT PELVIS) 5V BILAT COMPARISON:  Right hip and pelvis radiograph dated 06/22/2021 FINDINGS: Postsurgical changes from right proximal femoral fixation. Hardware appears intact and well seated. Minimally displaced transcervical fracture of the left proximal femur with varus angulation. Old right inferior pubic ramus fracture. Unchanged irregularity of the left inferior pubic ramus. Prior augmentation of the sacrum. IMPRESSION: Minimally displaced transcervical fracture of the left proximal femur. Electronically Signed   By: Limin  Xu M.D.   On: 06/06/2023 15:53   CT Cervical Spine Wo Contrast Result Date: 06/06/2023 CLINICAL DATA:  89 year old female status post fall striking head. EXAM: CT CERVICAL SPINE WITHOUT CONTRAST TECHNIQUE: Multidetector CT imaging of the cervical spine was performed without intravenous contrast. Multiplanar CT image reconstructions were also generated. RADIATION DOSE REDUCTION: This exam was performed according to the departmental dose-optimization program which includes automated exposure control, adjustment of the mA and/or kV according to patient size and/or use of iterative reconstruction technique. COMPARISON:  Head CT today.  Cervical spine CT 06/28/2021. FINDINGS: Mild motion artifact  today. Alignment: Stable cervical lordosis, cervicothoracic junction and posterior element alignment. Skull base and vertebrae: Chronic osteopenia. Visualized skull base is intact. No atlanto-occipital dissociation. C1 and C2 appear intact and aligned. No acute osseous abnormality identified. Soft tissues and spinal canal: No prevertebral fluid or swelling. No visible canal hematoma. Motion artifact, negative visible noncontrast neck soft tissues. Disc levels: Cervical spine degeneration superimposed on chronic degenerative ankylosis of the C3-C4 facets. Bulky chronic disc osteophyte complex at C5-C6. Upper chest: Chronic T2 compression fracture partially visible and appears stable. Developing upper thoracic posterior element ankylosis including that level. Partially calcified apical lung scarring appears stable. IMPRESSION: 1. Mild motion artifact. No acute traumatic injury identified in the cervical spine. 2. Chronic osteopenia, cervical spine degeneration, chronic T2 compression fracture. Electronically Signed   By: Marlise Simpers M.D.   On: 06/06/2023 11:56   CT Head Wo Contrast  Result Date: 06/06/2023 CLINICAL DATA:  89 year old female status post fall striking head. EXAM: CT HEAD WITHOUT CONTRAST TECHNIQUE: Contiguous axial images were obtained from the base of the skull through the vertex without intravenous contrast. RADIATION DOSE REDUCTION: This exam was performed according to the departmental dose-optimization program which includes automated exposure control, adjustment of the mA and/or kV according to patient size and/or use of iterative reconstruction technique. COMPARISON:  Head CT and brain MRI 06/22/2022. FINDINGS: Brain: Stable cerebral volume. No midline shift, acute ventriculomegaly, mass effect, evidence of mass lesion, intracranial hemorrhage or evidence of cortically based acute infarction. Stable gray-white differentiation including chronic heterogeneity in the deep gray nuclei, chronic right  occipital lobe encephalomalacia, small chronic cerebellar infarcts. Vascular: Calcified atherosclerosis at the skull base. No suspicious intracranial vascular hyperdensity. Skull: Stable osteopenia. Intact. No acute osseous abnormality identified. Sinuses/Orbits: Visualized paranasal sinuses and mastoids are stable and well aerated. Other: No acute orbit or scalp soft tissue injury identified. IMPRESSION: 1. No acute intracranial abnormality or acute traumatic injury identified. 2. Stable non contrast CT appearance of chronic ischemic disease. Electronically Signed   By: Marlise Simpers M.D.   On: 06/06/2023 11:54   Labs on Admission: I have personally reviewed following labs  CBC: Recent Labs  Lab 06/06/23 1602  WBC 19.3*  NEUTROABS 17.3*  HGB 8.5*  HCT 25.8*  MCV 99.6  PLT 315   Basic Metabolic Panel: Recent Labs  Lab 06/06/23 1602  NA 138  K 3.8  CL 107  CO2 23  GLUCOSE 109*  BUN 21  CREATININE 0.69  CALCIUM 8.1*   GFR: Estimated Creatinine Clearance: 35.4 mL/min (by C-G formula based on SCr of 0.69 mg/dL).  Liver Function Tests: Recent Labs  Lab 06/06/23 1602  AST 28  ALT 23  ALKPHOS 69  BILITOT 1.0  PROT 5.9*  ALBUMIN 2.8*   Urine analysis:    Component Value Date/Time   COLORURINE YELLOW (A) 06/22/2022 1347   APPEARANCEUR HAZY (A) 06/22/2022 1347   LABSPEC 1.021 06/22/2022 1347   PHURINE 5.0 06/22/2022 1347   GLUCOSEU NEGATIVE 06/22/2022 1347   HGBUR NEGATIVE 06/22/2022 1347   BILIRUBINUR NEGATIVE 06/22/2022 1347   KETONESUR NEGATIVE 06/22/2022 1347   PROTEINUR NEGATIVE 06/22/2022 1347   NITRITE POSITIVE (A) 06/22/2022 1347   LEUKOCYTESUR NEGATIVE 06/22/2022 1347   This document was prepared using Dragon Voice Recognition software and may include unintentional dictation errors.  Dr. Reinhold Carbine [facility]  If 7PM-7AM, please contact overnight-coverage provider If 7AM-7PM, please contact day attending provider [email or web address]  06/06/2023, 7:38 PM

## 2023-06-06 NOTE — ED Provider Notes (Addendum)
 [facility] Provider Note    Event Date/Time   First MD Initiated Contact with Patient 06/06/23 1059     (approximate)   History   Fall   HPI  Melissa [name] is a 89 year old female presenting to the emergency department for evaluation after a fall.  Patient was in a mechanical wheelchair when her husband thinks she accidentally pushed the button causing her to slip and fall and hit her head.  No LOC.  Takes baby aspirin, no other anticoagulation.  History of dementia and reportedly typically somnolent throughout the day.  No noted injuries to other areas.     Physical Exam   Triage Vital Signs: ED Triage Vitals  Encounter Vitals Group     BP 06/06/23 1109 (!) 99/51     Systolic BP Percentile --      Diastolic BP Percentile --      Pulse Rate 06/06/23 1106 65     Resp 06/06/23 1106 18     Temp 06/06/23 1106 97.7 F (36.5 C)     Temp Source 06/06/23 1106 Oral     SpO2 06/06/23 1106 94 %     Weight 06/06/23 1107 110 lb 3.7 oz (50 kg)     Height 06/06/23 1107 5\' 5"  (1.651 m)     Head Circumference --      Peak Flow --      Pain Score --      Pain Loc --      Pain Education --      Exclude from Growth Chart --     Most recent vital signs: Vitals:   06/06/23 1106 06/06/23 1109  BP:  (!) 99/51  Pulse: 65   Resp: 18   Temp: 97.7 F (36.5 C)   SpO2: 94%     Nursing notes and vital signs reviewed.  General: Adult female, laying in bed, somnolent but arousable Head: Skin tear over the left forehead with small amount of active bleeding Chest: Symmetric chest rise, no appreciable tenderness to palpation.  Cardiac: Regular rhythm and rate.  Respiratory: Lungs clear to auscultation Abdomen: Soft, nondistended. No appreciable tenderness to palpation.  Pelvis: Stable in AP and lateral compression. No appreciable tenderness to palpation. MSK: No deformity to bilateral upper and lower extremity.  Neuro: Somnolent, moans but not clearly answering  questions, at neurologic baseline per husband Skin: No evidence of burns or lacerations.   ED Results / Procedures / Treatments   Labs (all labs ordered are listed, but only abnormal results are displayed) Labs Reviewed - No data to display   EKG EKG independently reviewed interpreted by myself (ER attending) demonstrates:    RADIOLOGY Imaging independently reviewed and interpreted by myself demonstrates:   CT C-spine without acute fracture CT head without acute bleed  Formal Radiology Read:  CT Cervical Spine Wo Contrast Result Date: 06/06/2023 CLINICAL DATA:  89 year old female status post fall striking head. EXAM: CT CERVICAL SPINE WITHOUT CONTRAST TECHNIQUE: Multidetector CT imaging of the cervical spine was performed without intravenous contrast. Multiplanar CT image reconstructions were also generated. RADIATION DOSE REDUCTION: This exam was performed according to the departmental dose-optimization program which includes automated exposure control, adjustment of the mA and/or kV according to patient size and/or use of iterative reconstruction technique. COMPARISON:  Head CT today.  Cervical spine CT 06/28/2021. FINDINGS: Mild motion artifact today. Alignment: Stable cervical lordosis, cervicothoracic junction and posterior element alignment. Skull base and vertebrae: Chronic osteopenia. Visualized skull base  is intact. No atlanto-occipital dissociation. C1 and C2 appear intact and aligned. No acute osseous abnormality identified. Soft tissues and spinal canal: No prevertebral fluid or swelling. No visible canal hematoma. Motion artifact, negative visible noncontrast neck soft tissues. Disc levels: Cervical spine degeneration superimposed on chronic degenerative ankylosis of the C3-C4 facets. Bulky chronic disc osteophyte complex at C5-C6. Upper chest: Chronic T2 compression fracture partially visible and appears stable. Developing upper thoracic posterior element ankylosis including that  level. Partially calcified apical lung scarring appears stable. IMPRESSION: 1. Mild motion artifact. No acute traumatic injury identified in the cervical spine. 2. Chronic osteopenia, cervical spine degeneration, chronic T2 compression fracture. Electronically Signed   By: Marlise Simpers M.D.   On: 06/06/2023 11:56   CT Head Wo Contrast Result Date: 06/06/2023 CLINICAL DATA:  89 year old female status post fall striking head. EXAM: CT HEAD WITHOUT CONTRAST TECHNIQUE: Contiguous axial images were obtained from the base of the skull through the vertex without intravenous contrast. RADIATION DOSE REDUCTION: This exam was performed according to the departmental dose-optimization program which includes automated exposure control, adjustment of the mA and/or kV according to patient size and/or use of iterative reconstruction technique. COMPARISON:  Head CT and brain MRI 06/22/2022. FINDINGS: Brain: Stable cerebral volume. No midline shift, acute ventriculomegaly, mass effect, evidence of mass lesion, intracranial hemorrhage or evidence of cortically based acute infarction. Stable gray-white differentiation including chronic heterogeneity in the deep gray nuclei, chronic right occipital lobe encephalomalacia, small chronic cerebellar infarcts. Vascular: Calcified atherosclerosis at the skull base. No suspicious intracranial vascular hyperdensity. Skull: Stable osteopenia. Intact. No acute osseous abnormality identified. Sinuses/Orbits: Visualized paranasal sinuses and mastoids are stable and well aerated. Other: No acute orbit or scalp soft tissue injury identified. IMPRESSION: 1. No acute intracranial abnormality or acute traumatic injury identified. 2. Stable non contrast CT appearance of chronic ischemic disease. Electronically Signed   By: Marlise Simpers M.D.   On: 06/06/2023 11:54    PROCEDURES:  Critical Care performed: No  Procedures   MEDICATIONS ORDERED IN ED: Medications - No data to display   IMPRESSION / MDM /  ASSESSMENT AND PLAN / ED COURSE  I reviewed the triage vital signs and the nursing notes.  Differential diagnosis includes, but is not limited to: Intracranial bleed, skull fracture, spine fracture, no evidence of thoracoabdominal trauma  Patient's presentation is most consistent with acute presentation with potential threat to life or bodily function.  Patient presents after mechanical fall with head trauma. CT head and C-spine without acute traumatic injury. No new complaints on reeevaluation.  Some active oozing noted from area of skin tear on the forehead.  No clear laceration amenable for repair, but area was cleaned and closed with skin glue. Do think patient is stable for discharge. Strict return precautions provided.  Patient discharged in stable condition.  Addendum: Shortly after discharge, notified by nurse that facility was concerned about possible hip injury. No obvious injury on exam, but limited evaluation due to patient's mental status.  Will obtain bilateral hip x-Alaiza Yau to further evaluate.  Signed out to oncoming physician at 1500 pending x-Miriam Kestler and disposition.  If x-Kallyn Demarcus is negative, suspect patient can be discharged back to her facility.     FINAL CLINICAL IMPRESSION(S) / ED DIAGNOSES   Final diagnoses:  Closed head injury, initial encounter     Rx / DC Orders   ED Discharge Orders     None        Note:  This document was  prepared using Conservation officer, historic buildings and may include unintentional dictation errors.   Claria Crofts, MD 06/06/23 1243    Claria Crofts, MD 06/06/23 [phone]

## 2023-06-06 NOTE — Assessment & Plan Note (Signed)
 Home MiraLAX daily resumed Docusate sodium, twice daily ordered on admission

## 2023-06-06 NOTE — ED Notes (Signed)
 Called and gave updated report to [place] at [facility] community

## 2023-06-06 NOTE — ED Notes (Signed)
 Wound cleaned and bandaged with telfa and coband

## 2023-06-06 NOTE — Hospital Course (Addendum)
 Ms. Melissa [name] is a 89 year old female with history of dementia, iron deficiency anemia, depression, GERD, history of chronic compression fracture, who presents emergency department for chief concerns of a witnessed fall at facility.  Vitals in the ED showed temperature of 97.7, respiration rate 18, heart rate 65, blood pressure 99/51, SpO2 of 94% on room air.  Serum sodium 138, potassium 2.8, chloride 107, bicarb 23, BUN of 21, serum creatinine of 0.69, EGFR greater than 60, nonfasting blood glucose 109.  WBC was elevated at 19.3, hemoglobin 8.5, platelets of 315.  ED treatment: Morphine 2 mg IV one-time dose.  EDP consulted orthopedic surgery who states that the patient will be taken to the OR tomorrow.  N.p.o. after midnight.

## 2023-06-06 NOTE — Assessment & Plan Note (Signed)
 Calcium, vitamin D supplementation resumed

## 2023-06-07 ENCOUNTER — Inpatient Hospital Stay

## 2023-06-07 ENCOUNTER — Other Ambulatory Visit: Payer: Self-pay

## 2023-06-07 ENCOUNTER — Encounter: Admission: EM | Disposition: E | Payer: Self-pay | Source: Home / Self Care | Attending: Internal Medicine

## 2023-06-07 ENCOUNTER — Encounter: Payer: Self-pay | Admitting: Student

## 2023-06-07 ENCOUNTER — Inpatient Hospital Stay: Admitting: Anesthesiology

## 2023-06-07 DIAGNOSIS — E440 Moderate protein-calorie malnutrition: Secondary | ICD-10-CM | POA: Insufficient documentation

## 2023-06-07 DIAGNOSIS — F01518 Vascular dementia, unspecified severity, with other behavioral disturbance: Secondary | ICD-10-CM

## 2023-06-07 DIAGNOSIS — F32A Depression, unspecified: Secondary | ICD-10-CM

## 2023-06-07 DIAGNOSIS — S72002A Fracture of unspecified part of neck of left femur, initial encounter for closed fracture: Principal | ICD-10-CM

## 2023-06-07 DIAGNOSIS — E43 Unspecified severe protein-calorie malnutrition: Secondary | ICD-10-CM | POA: Diagnosis not present

## 2023-06-07 HISTORY — PX: HIP ARTHROPLASTY: SHX981

## 2023-06-07 LAB — BASIC METABOLIC PANEL WITH GFR
Anion gap: 8 (ref 5–15)
BUN: 22 mg/dL (ref 8–23)
CO2: 25 mmol/L (ref 22–32)
Calcium: 8.5 mg/dL — ABNORMAL LOW (ref 8.9–10.3)
Chloride: 106 mmol/L (ref 98–111)
Creatinine, Ser: 0.68 mg/dL (ref 0.44–1.00)
GFR, Estimated: >60 mL/min (ref >60)
Glucose, Bld: 113 mg/dL — ABNORMAL HIGH (ref 70–99)
Potassium: 3.8 mmol/L (ref 3.5–5.1)
Sodium: 139 mmol/L (ref 135–145)

## 2023-06-07 LAB — CBC
HCT: 24.3 % — ABNORMAL LOW (ref 36.0–46.0)
Hemoglobin: 8.5 g/dL — ABNORMAL LOW (ref 12.0–15.0)
MCH: 33.6 pg (ref 26.0–34.0)
MCHC: 35 g/dL (ref 30.0–36.0)
MCV: 96 fL (ref 80.0–100.0)
Platelets: 322 10*3/uL (ref 150–400)
RBC: 2.53 MIL/uL — ABNORMAL LOW (ref 3.87–5.11)
RDW: 19.3 % — ABNORMAL HIGH (ref 11.5–15.5)
WBC: 12.7 10*3/uL — ABNORMAL HIGH (ref 4.0–10.5)
nRBC: 0 % (ref 0.0–0.2)

## 2023-06-07 LAB — TYPE AND SCREEN
ABO/RH(D): O POS
Antibody Screen: NEGATIVE

## 2023-06-07 SURGERY — HEMIARTHROPLASTY (BIPOLAR) HIP, POSTERIOR APPROACH FOR FRACTURE
Anesthesia: Spinal | Laterality: Left

## 2023-06-07 MED ORDER — TRANEXAMIC ACID-NACL 1000-0.7 MG/100ML-% IV SOLN
INTRAVENOUS | Status: AC
  Filled 2023-06-07: qty 100

## 2023-06-07 MED ORDER — OXYCODONE HCL 5 MG/5ML PO SOLN: 5.0000 mg | Freq: Once | ORAL | Status: DC | PRN

## 2023-06-07 MED ORDER — KETOROLAC TROMETHAMINE 15 MG/ML IJ SOLN
7.5000 mg | Freq: Four times a day (QID) | INTRAMUSCULAR | Status: AC
Start: 2023-06-07 — End: 2023-06-08
  Administered 2023-06-07 – 2023-06-08 (×4): 7.5 mg via INTRAVENOUS
  Filled 2023-06-07 (×4): qty 1

## 2023-06-07 MED ORDER — FENTANYL CITRATE (PF) 100 MCG/2ML IJ SOLN: 25.0000 ug | INTRAMUSCULAR | Status: DC | PRN

## 2023-06-07 MED ORDER — TRANEXAMIC ACID-NACL 1000-0.7 MG/100ML-% IV SOLN
INTRAVENOUS | Status: DC | PRN
Start: 2023-06-07 — End: 2023-06-07
  Administered 2023-06-07: 1000 mg via INTRAVENOUS

## 2023-06-07 MED ORDER — SODIUM CHLORIDE 0.9 % IR SOLN
Status: DC | PRN
Start: 2023-06-07 — End: 2023-06-07
  Administered 2023-06-07: 1580 mL

## 2023-06-07 MED ORDER — 0.9 % SODIUM CHLORIDE (POUR BTL) OPTIME
TOPICAL | Status: DC | PRN
  Administered 2023-06-07: 1000 mL

## 2023-06-07 MED ORDER — PHENYLEPHRINE HCL-NACL 20-0.9 MG/250ML-% IV SOLN
INTRAVENOUS | Status: AC
  Filled 2023-06-07: qty 250

## 2023-06-07 MED ORDER — BUPIVACAINE HCL (PF) 0.5 % IJ SOLN
INTRAMUSCULAR | Status: AC
  Filled 2023-06-07: qty 30

## 2023-06-07 MED ORDER — FENTANYL CITRATE (PF) 100 MCG/2ML IJ SOLN
INTRAMUSCULAR | Status: AC
  Filled 2023-06-07: qty 2

## 2023-06-07 MED ORDER — PHENYLEPHRINE 80 MCG/ML (10ML) SYRINGE FOR IV PUSH (FOR BLOOD PRESSURE SUPPORT)
PREFILLED_SYRINGE | INTRAVENOUS | Status: AC
  Filled 2023-06-07: qty 10

## 2023-06-07 MED ORDER — ONDANSETRON HCL 4 MG/2ML IJ SOLN
INTRAMUSCULAR | Status: AC
  Filled 2023-06-07: qty 2

## 2023-06-07 MED ORDER — ADULT MULTIVITAMIN W/MINERALS CH
1.0000 | ORAL_TABLET | Freq: Every day | ORAL | Status: DC
  Administered 2023-06-08 – 2023-06-10 (×3): 1 via ORAL
  Filled 2023-06-07 (×2): qty 1

## 2023-06-07 MED ORDER — OXYCODONE HCL 5 MG PO TABS: 5.0000 mg | ORAL_TABLET | Freq: Once | ORAL | Status: DC | PRN

## 2023-06-07 MED ORDER — KETOROLAC TROMETHAMINE 15 MG/ML IJ SOLN
INTRAMUSCULAR | Status: AC
  Filled 2023-06-07: qty 1

## 2023-06-07 MED ORDER — PHENYLEPHRINE HCL-NACL 20-0.9 MG/250ML-% IV SOLN
INTRAVENOUS | Status: DC | PRN
  Administered 2023-06-07: 25 ug/min via INTRAVENOUS

## 2023-06-07 MED ORDER — BUPIVACAINE HCL (PF) 0.5 % IJ SOLN
INTRAMUSCULAR | Status: AC
  Filled 2023-06-07: qty 10

## 2023-06-07 MED ORDER — TRANEXAMIC ACID-NACL 1000-0.7 MG/100ML-% IV SOLN
1000.0000 mg | Freq: Once | INTRAVENOUS | Status: AC
  Administered 2023-06-07: 1000 mg via INTRAVENOUS

## 2023-06-07 MED ORDER — ACETAMINOPHEN 10 MG/ML IV SOLN: 15.0000 mg/kg | Freq: Once | INTRAVENOUS | Status: DC | PRN

## 2023-06-07 MED ORDER — EPHEDRINE 5 MG/ML INJ
INTRAVENOUS | Status: AC
  Filled 2023-06-07: qty 5

## 2023-06-07 MED ORDER — BUPIVACAINE LIPOSOME 1.3 % IJ SUSP
INTRAMUSCULAR | Status: DC | PRN
  Administered 2023-06-07: 50 mL

## 2023-06-07 MED ORDER — ENSURE ENLIVE PO LIQD
237.0000 mL | Freq: Two times a day (BID) | ORAL | Status: DC
  Administered 2023-06-08 – 2023-06-12 (×7): 237 mL via ORAL

## 2023-06-07 MED ORDER — BUPIVACAINE LIPOSOME 1.3 % IJ SUSP
INTRAMUSCULAR | Status: AC
  Filled 2023-06-07: qty 20

## 2023-06-07 MED ORDER — SODIUM CHLORIDE 0.9 % IV SOLN: INTRAVENOUS | Status: DC

## 2023-06-07 MED ORDER — PROPOFOL 10 MG/ML IV BOLUS
INTRAVENOUS | Status: AC
  Filled 2023-06-07: qty 40

## 2023-06-07 MED ORDER — ONDANSETRON HCL 4 MG/2ML IJ SOLN
INTRAMUSCULAR | Status: DC | PRN
  Administered 2023-06-07: 4 mg via INTRAVENOUS

## 2023-06-07 MED ORDER — PHENYLEPHRINE 80 MCG/ML (10ML) SYRINGE FOR IV PUSH (FOR BLOOD PRESSURE SUPPORT)
PREFILLED_SYRINGE | INTRAVENOUS | Status: DC | PRN
  Administered 2023-06-07 (×4): 160 ug via INTRAVENOUS

## 2023-06-07 MED ORDER — PROPOFOL 10 MG/ML IV BOLUS
INTRAVENOUS | Status: DC | PRN
  Administered 2023-06-07: 20 mg via INTRAVENOUS
  Administered 2023-06-07: 30 mg via INTRAVENOUS

## 2023-06-07 MED ORDER — LACTATED RINGERS IV SOLN: INTRAVENOUS | Status: DC

## 2023-06-07 MED ORDER — FENTANYL CITRATE (PF) 100 MCG/2ML IJ SOLN
INTRAMUSCULAR | Status: DC | PRN
  Administered 2023-06-07: 25 ug via INTRAVENOUS

## 2023-06-07 MED ORDER — EPHEDRINE SULFATE (PRESSORS) 50 MG/ML IJ SOLN
INTRAMUSCULAR | Status: DC | PRN
  Administered 2023-06-07: 5 mg via INTRAVENOUS

## 2023-06-07 MED ORDER — BUPIVACAINE HCL (PF) 0.5 % IJ SOLN
INTRAMUSCULAR | Status: DC | PRN
  Administered 2023-06-07: 2.6 mL

## 2023-06-07 MED ORDER — KETAMINE HCL 50 MG/5ML IJ SOSY
PREFILLED_SYRINGE | INTRAMUSCULAR | Status: AC
  Filled 2023-06-07: qty 5

## 2023-06-07 MED ORDER — PROPOFOL 500 MG/50ML IV EMUL
INTRAVENOUS | Status: DC | PRN
  Administered 2023-06-07: 50 ug/kg/min via INTRAVENOUS

## 2023-06-07 SURGICAL SUPPLY — 51 items
BLADE SAW SGTL 13X75X1.27 (BLADE) ×2 IMPLANT
BNDG COHESIVE 4X5 TAN STRL LF (GAUZE/BANDAGES/DRESSINGS) ×2 IMPLANT
BOWL CEMENT MIXING ADV NOZZLE (MISCELLANEOUS) ×2 IMPLANT
CEMENT BONE 10-PACK (Cement) ×4 IMPLANT
CHLORAPREP W/TINT 26 (MISCELLANEOUS) ×4 IMPLANT
COVER MAYO STAND STRL (DRAPES) ×2 IMPLANT
DRAPE INCISE IOBAN 66X60 STRL (DRAPES) ×2 IMPLANT
DRAPE SHEET LG 3/4 BI-LAMINATE (DRAPES) ×2 IMPLANT
DRAPE SURG ORHT 6 SPLT 77X108 (DRAPES) ×2 IMPLANT
DRAPE TABLE BACK 80X90 (DRAPES) ×2 IMPLANT
DRSG OPSITE POSTOP 4X10 (GAUZE/BANDAGES/DRESSINGS) IMPLANT
DRSG OPSITE POSTOP 4X8 (GAUZE/BANDAGES/DRESSINGS) IMPLANT
ELECT REM PT RETURN 9FT ADLT (ELECTROSURGICAL) ×1 IMPLANT
ELECTRODE REM PT RTRN 9FT ADLT (ELECTROSURGICAL) ×2 IMPLANT
GAUZE XEROFORM 1X8 LF (GAUZE/BANDAGES/DRESSINGS) ×2 IMPLANT
GLOVE BIOGEL PI IND STRL 8 (GLOVE) ×4 IMPLANT
GLOVE SKINSENSE STRL SZ8.0 LF (GLOVE) ×2 IMPLANT
GLOVE SURG ORTHO 8.0 STRL STRW (GLOVE) ×4 IMPLANT
GOWN STRL REUS W/ TWL LRG LVL3 (GOWN DISPOSABLE) ×2 IMPLANT
GOWN STRL REUS W/ TWL XL LVL3 (GOWN DISPOSABLE) ×2 IMPLANT
HEAD UNPLR 45XMDLR STRL HIP (Orthopedic Implant) IMPLANT
HEMOVAC 400ML (MISCELLANEOUS) IMPLANT
HOOD PEEL AWAY T7 (MISCELLANEOUS) ×4 IMPLANT
IMMBOLIZER KNEE 19 BLUE UNIV (SOFTGOODS) ×2 IMPLANT
KIT DRAIN HEMOVAC JP 7FR 400ML (MISCELLANEOUS) IMPLANT
KIT PREP HIP W/CEMENT RESTRICT (Miscellaneous) ×2 IMPLANT
KIT TURNOVER KIT A (KITS) ×2 IMPLANT
MANIFOLD NEPTUNE II (INSTRUMENTS) ×2 IMPLANT
MAT ABSORB FLUID 56X50 GRAY (MISCELLANEOUS) ×2 IMPLANT
NDL HYPO 22X1.5 SAFETY MO (MISCELLANEOUS) ×2 IMPLANT
NDL MAYO CATGUT SZ1 (NEEDLE) ×2 IMPLANT
NEEDLE HYPO 22X1.5 SAFETY MO (MISCELLANEOUS) ×1 IMPLANT
NEEDLE MAYO CATGUT SZ1 (NEEDLE) ×1 IMPLANT
NS IRRIG 500ML POUR BTL (IV SOLUTION) ×2 IMPLANT
PACK HIP PROSTHESIS (MISCELLANEOUS) ×2 IMPLANT
PENCIL SMOKE EVACUATOR (MISCELLANEOUS) ×2 IMPLANT
PULSAVAC PLUS IRRIG FAN TIP (DISPOSABLE) ×1 IMPLANT
SLEEVE UNITRAX V40 STD (Orthopedic Implant) IMPLANT
SOL .9 NS 3000ML IRR UROMATIC (IV SOLUTION) ×2 IMPLANT
SPACER OSTEO CEMENT 14 HIP (Orthopedic Implant) IMPLANT
STAPLER SKIN PROX 35W (STAPLE) ×2 IMPLANT
STEM HIP ACCOLADE SZ5 37X145 (Stem) IMPLANT
SUT ETHIBOND #5 BRAIDED 30INL (SUTURE) ×2 IMPLANT
SUT VIC AB 0 CT1 36 (SUTURE) ×2 IMPLANT
SUT VIC AB 2-0 CT2 27 (SUTURE) ×4 IMPLANT
SYR 20ML LL LF (SYRINGE) ×4 IMPLANT
TAPE MICROFOAM 4IN (TAPE) ×2 IMPLANT
TIP FAN IRRIG PULSAVAC PLUS (DISPOSABLE) ×2 IMPLANT
TRAP FLUID SMOKE EVACUATOR (MISCELLANEOUS) ×2 IMPLANT
TUBE KAMVAC SUCTION (TUBING) ×2 IMPLANT
TUBE SUCT KAM VAC (TUBING) ×2 IMPLANT

## 2023-06-07 NOTE — Progress Notes (Signed)
 Initial Nutrition Assessment  DOCUMENTATION CODES:   Underweight, Non-severe (moderate) malnutrition in context of chronic illness  INTERVENTION:   -Once diet is advanced, add:   -MVI with minerals daily -Ensure Enlive po BID, each supplement provides 350 kcal and 20 grams of protein.  -Magic cup BID with meals, each supplement provides 290 kcal and 9 grams of protein  -Feeding assistance with meals  NUTRITION DIAGNOSIS:   Moderate Malnutrition related to chronic illness (dementia) as evidenced by mild fat depletion, moderate fat depletion, moderate muscle depletion, severe muscle depletion.  GOAL:   Patient will meet greater than or equal to 90% of their needs  MONITOR:   PO intake, Supplement acceptance, Diet advancement  REASON FOR ASSESSMENT:   Consult Assessment of nutrition requirement/status, Hip fracture protocol  ASSESSMENT:   Pt with history of dementia, iron deficiency anemia, depression, GERD, history of chronic compression fracture, who presents for chief concerns of a witnessed fall at facility.  Pt admitted with lt femur fracture.   Reviewed I/O's: -400 ml x 24 hours  UOP: 400 ml x 24 hours  Per orthopedics notes, plan for surgery today. Pt is NPO in anticipation for procedure.   Pt sleeping soundly at time of visit; awoke easily when greeted. No family at bedside. Pt smiled at this RD and stated I thought you were one of my kids coming to visit me. RD reoriented pt to place and situation; she does not remember that she is in the hospital or what brought her here. She was able to relate managed pain in lt leg and that she lived at [facility]. Pt unable to provide further history secondary to dementia, but reports she ate well PTA. Pt unable to provide diet recall.   Reviewed wt hx; pt has experienced a 3.7% wt loss over the past month, which is not significant for time frame.   Pt with malnutrition and increased nutritional needs for post-op  healing. Pt would greatly benefit from addition of oral nutrition supplements.   Medications reviewed and include calcium  carbonate, vitamin D3, colace, remeron , miralax , and florastor.   Labs reviewed.    NUTRITION - FOCUSED PHYSICAL EXAM:  Flowsheet Row Most Recent Value  Orbital Region Moderate depletion  Upper Arm Region Moderate depletion  Thoracic and Lumbar Region Mild depletion  Buccal Region Mild depletion  Temple Region Moderate depletion  Clavicle Bone Region Severe depletion  Clavicle and Acromion Bone Region Severe depletion  Scapular Bone Region Severe depletion  Dorsal Hand Severe depletion  Patellar Region Moderate depletion  Anterior Thigh Region Moderate depletion  Edema (RD Assessment) None  Hair Reviewed  Eyes Reviewed  Mouth Reviewed  Skin Reviewed  Nails Reviewed       Diet Order:   Diet Order             Diet NPO time specified  Diet effective midnight                   EDUCATION NEEDS:   No education needs have been identified at this time  Skin:  Skin Assessment: Reviewed RN Assessment  Last BM:  Unknown  Height:   Ht Readings from Last 1 Encounters:  06/06/23 5' 5 (1.651 m)    Weight:   Wt Readings from Last 1 Encounters:  06/06/23 50 kg    Ideal Body Weight:  56.8 kg  BMI:  Body mass index is 18.34 kg/m.  Estimated Nutritional Needs:   Kcal:  1500-1700  Protein:  75-90 grams  Fluid:  1.5-1.7 L    Margery ORN, RD, LDN, CDCES Registered Dietitian III Certified Diabetes Care and Education Specialist If unable to reach this RD, please use RD Inpatient group chat on secure chat between hours of 8am-4 pm daily

## 2023-06-07 NOTE — Consult Note (Signed)
 ORTHOPAEDIC CONSULTATION  REQUESTING PHYSICIAN: [name], MD  Chief Complaint:   L hip pain  History of Present Illness: History obtained from review of the chart, discussion with medical providers, and from her husband at the bedside.  Melissa [name] is a 89 y.o. female with a history of dementia, anemia, and depression who had a fall yesterday.  This occurred while she was in a mechanical wheelchair and she may have accidentally pushed a button causing her to slip and fall.  The patient noted immediate hip pain and difficulty with weightbearing.  The patient ambulates with a walker at baseline.  Patient lives independently her husband.  Pain is worse with any sort of movement.  X-rays in the emergency department show a left displaced femoral neck fracture.  Past Medical History:  Diagnosis Date   Arthritis    Chest pain    a. 05/2015 - post-prandial.   GERD (gastroesophageal reflux disease)    a. takes Omeprazole  daily, prev seen by D. Brodie.   Heart murmur    a. 05/2013 Echo: EF 55-60%, gr2 DD, mild AI/MR.   History of blood transfusion    no abnormal reaction noted    History of migraine headaches    a. last 10/2013   Iron deficiency anemia    Joint pain    PONV (postoperative nausea and vomiting)    Rectocele 2011   Past Surgical History:  Procedure Laterality Date   cataract surgery  Bilateral    R 03/2001; L 05/2001.   COLONOSCOPY     HYSTEROSCOPY  11/1999   w/ resection of endometrial polyp by uterine curetting.   INTRAMEDULLARY (IM) NAIL INTERTROCHANTERIC Right 07/15/2020   Procedure: INTRAMEDULLARY (IM) NAIL INTERTROCHANTRIC;  Surgeon: Rande Bushy, MD;  Location: [facility] ORS;  Service: Orthopedics;  Laterality: Right;   JOINT REPLACEMENT Bilateral R-9/04, L- 8/05   Total Knee Arthroplasties.   KYPHOPLASTY N/A 04/10/2019   Procedure: KYPHOPLASTY;  Surgeon: Molli Angelucci, MD;  Location: [facility] ORS;   Service: Orthopedics;  Laterality: N/A;   REVERSE SHOULDER ARTHROPLASTY Right 11/26/2013   Procedure: RIGHT REVERSE SHOULDER ARTHROPLASTY;  Surgeon: Glo Larch, MD;  Location: [facility] OR;  Service: Orthopedics;  Laterality: Right;   SHOULDER ARTHROSCOPY Right    THYROIDECTOMY  at age 41   TONSILLECTOMY     Social History   Socioeconomic History   Marital status: Married    Spouse name: Not on file   Number of children: Not on file   Years of education: Not on file   Highest education level: Not on file  Occupational History   Not on file  Tobacco Use   Smoking status: Never   Smokeless tobacco: Never  Vaping Use   Vaping status: Never Used  Substance and Sexual Activity   Alcohol  use: No   Drug use: No   Sexual activity: Not Currently    Birth control/protection: Post-menopausal  Other Topics Concern   Not on file  Social History Narrative   Retired.  Very active.  Lives at [facility] with 59 y/o husband.  Walks one mile daily (~ 40 mins) and partakes in H2O aerobics twice/wk.  Enjoys gardening.   Social Drivers of Corporate investment banker Strain: Not on file  Food Insecurity: Patient Unable To Answer (06/06/2023)   Hunger Vital Sign    Worried About Running Out of Food in the Last Year: Patient unable to answer    Ran Out of Food in the Last Year: Patient unable  to answer  Transportation Needs: Patient Unable To Answer (06/06/2023)   PRAPARE - Transportation    Lack of Transportation (Medical): Patient unable to answer    Lack of Transportation (Non-Medical): Patient unable to answer  Physical Activity: Not on file  Stress: Not on file  Social Connections: Unknown (06/06/2023)   Social Connection and Isolation Panel [NHANES]    Frequency of Communication with Friends and Family: Patient unable to answer    Frequency of Social Gatherings with Friends and Family: Patient unable to answer    Attends Religious Services: Patient unable to answer    Active Member of Clubs  or Organizations: Patient unable to answer    Attends Banker Meetings: Patient unable to answer    Marital Status: Married   Family History  Problem Relation Age of Onset   Heart attack Mother        died @ 60   Heart attack Brother        died @ 72   Heart attack Brother        died @ 53   Other Father        died suddenly @ 11.   Allergies  Allergen Reactions   Sulfonamide Derivatives Hives   Sulfa Antibiotics Other (See Comments) and Rash   Prior to Admission medications   Medication Sig Start Date End Date Taking? Authorizing Provider  acetaminophen  (TYLENOL ) 500 MG tablet Take 1,000 mg by mouth 3 (three) times daily.   Yes [provider]  aspirin  EC 81 MG tablet Take 81 mg by mouth daily. Swallow whole.   Yes [provider]  atorvastatin  (LIPITOR) 40 MG tablet Take 1 tablet (40 mg total) by mouth daily. 06/26/22  Yes Luna Salinas, MD  bismuth  subsalicylate (PEPTO BISMOL) 262 MG/15ML suspension Take 30 mLs by mouth every 4 (four) hours as needed.   Yes [provider]  calcium  carbonate (OSCAL) 1500 (600 Ca) MG TABS tablet Take 600 mg of elemental calcium  by mouth daily.   Yes [provider]  cholecalciferol  (VITAMIN D ) 1000 units tablet Take 1,000 Units by mouth daily.   Yes [provider]  mirtazapine  (REMERON ) 7.5 MG tablet Take 7.5 mg by mouth at bedtime.   Yes [provider]  polyethylene glycol (MIRALAX  / GLYCOLAX ) 17 g packet Take 17 g by mouth daily.   Yes [provider]  saccharomyces boulardii (FLORASTOR) 250 MG capsule Take 250 mg by mouth daily.   Yes [provider]  mirabegron  ER (MYRBETRIQ ) 25 MG TB24 tablet Take 25 mg by mouth daily. Patient not taking: Reported on 05/21/2023    [provider]  UNABLE TO FIND Take 4 oz by mouth 2 (two) times daily. Med Name: Medpass    [provider]   Recent Labs    06/06/23 1602 06/07/23 0417  WBC 19.3* 12.7*  HGB  8.5* 8.5*  HCT 25.8* 24.3*  PLT 315 322  K 3.8 3.8  CL 107 106  CO2 23 25  BUN 21 22  CREATININE 0.69 0.68  GLUCOSE 109* 113*  CALCIUM  8.1* 8.5*   DG Chest Portable 1 View Result Date: 06/06/2023 CLINICAL DATA:  Fall.  Left hip fracture.  Preop exam. EXAM: PORTABLE CHEST 1 VIEW COMPARISON:  06/22/2021 radiograph FINDINGS: Stable cardiomegaly. Aortic atherosclerotic calcification. Low lung volumes accentuate pulmonary vascularity. Bibasilar atelectasis. No pleural effusion or pneumothorax. No displaced rib fractures. Nodular opacity projecting over the right lower lung is favored osseous in nature possibly  related to remote rib fracture. Right reverse TSA. L1 vertebroplasty. IMPRESSION: Low lung volumes with bibasilar atelectasis. Electronically Signed   By: Rozell Cornet M.D.   On: 06/06/2023 18:54   DG HIPS BILAT WITH PELVIS MIN 5 VIEWS Result Date: 06/06/2023 CLINICAL DATA:  Witnessed fall EXAM: DG HIP (WITH OR WITHOUT PELVIS) 5V BILAT COMPARISON:  Right hip and pelvis radiograph dated 06/22/2021 FINDINGS: Postsurgical changes from right proximal femoral fixation. Hardware appears intact and well seated. Minimally displaced transcervical fracture of the left proximal femur with varus angulation. Old right inferior pubic ramus fracture. Unchanged irregularity of the left inferior pubic ramus. Prior augmentation of the sacrum. IMPRESSION: Minimally displaced transcervical fracture of the left proximal femur. Electronically Signed   By: Limin  Xu M.D.   On: 06/06/2023 15:53   CT Cervical Spine Wo Contrast Result Date: 06/06/2023 CLINICAL DATA:  89 year old female status post fall striking head. EXAM: CT CERVICAL SPINE WITHOUT CONTRAST TECHNIQUE: Multidetector CT imaging of the cervical spine was performed without intravenous contrast. Multiplanar CT image reconstructions were also generated. RADIATION DOSE REDUCTION: This exam was performed according to the departmental dose-optimization program  which includes automated exposure control, adjustment of the mA and/or kV according to patient size and/or use of iterative reconstruction technique. COMPARISON:  Head CT today.  Cervical spine CT 06/28/2021. FINDINGS: Mild motion artifact today. Alignment: Stable cervical lordosis, cervicothoracic junction and posterior element alignment. Skull base and vertebrae: Chronic osteopenia. Visualized skull base is intact. No atlanto-occipital dissociation. C1 and C2 appear intact and aligned. No acute osseous abnormality identified. Soft tissues and spinal canal: No prevertebral fluid or swelling. No visible canal hematoma. Motion artifact, negative visible noncontrast neck soft tissues. Disc levels: Cervical spine degeneration superimposed on chronic degenerative ankylosis of the C3-C4 facets. Bulky chronic disc osteophyte complex at C5-C6. Upper chest: Chronic T2 compression fracture partially visible and appears stable. Developing upper thoracic posterior element ankylosis including that level. Partially calcified apical lung scarring appears stable. IMPRESSION: 1. Mild motion artifact. No acute traumatic injury identified in the cervical spine. 2. Chronic osteopenia, cervical spine degeneration, chronic T2 compression fracture. Electronically Signed   By: Marlise Simpers M.D.   On: 06/06/2023 11:56   CT Head Wo Contrast Result Date: 06/06/2023 CLINICAL DATA:  89 year old female status post fall striking head. EXAM: CT HEAD WITHOUT CONTRAST TECHNIQUE: Contiguous axial images were obtained from the base of the skull through the vertex without intravenous contrast. RADIATION DOSE REDUCTION: This exam was performed according to the departmental dose-optimization program which includes automated exposure control, adjustment of the mA and/or kV according to patient size and/or use of iterative reconstruction technique. COMPARISON:  Head CT and brain MRI 06/22/2022. FINDINGS: Brain: Stable cerebral volume. No midline shift, acute  ventriculomegaly, mass effect, evidence of mass lesion, intracranial hemorrhage or evidence of cortically based acute infarction. Stable gray-white differentiation including chronic heterogeneity in the deep gray nuclei, chronic right occipital lobe encephalomalacia, small chronic cerebellar infarcts. Vascular: Calcified atherosclerosis at the skull base. No suspicious intracranial vascular hyperdensity. Skull: Stable osteopenia. Intact. No acute osseous abnormality identified. Sinuses/Orbits: Visualized paranasal sinuses and mastoids are stable and well aerated. Other: No acute orbit or scalp soft tissue injury identified. IMPRESSION: 1. No acute intracranial abnormality or acute traumatic injury identified. 2. Stable non contrast CT appearance of chronic ischemic disease. Electronically Signed   By: Marlise Simpers M.D.   On: 06/06/2023 11:54     Positive ROS: All other systems have been reviewed and  were otherwise negative with the exception of those mentioned in the HPI and as above.  Physical Exam: BP 134/68 (BP Location: Left Arm)   Pulse 93   Temp 98.3 F (36.8 C) (Oral)   Resp 18   Ht 5\' 5"  (1.651 m)   Wt 50 kg   SpO2 96%   BMI 18.34 kg/m  General:  Alert, no acute distress Psychiatric:  Patient is NOT competent for consent; normal mood and affect    Orthopedic Exam:  LLE: + DF/PF/EHL SILT grossly over foot Foot wwp +Log roll/axial load Leg shortened and externally rotated   Imaging:  As above: L displaced femoral neck fracture, s/p right cephalomedullary nailing with healed fracture  Assessment/Plan: DORLISA SAVINO is a 89 y.o. female with a L displaced femoral neck fracture  1. I discussed the various treatment options including both surgical and non-surgical management of the fracture with the patient and/or family (medical PoA). We discussed the high risk of perioperative complications due to patient's age, dementia, and other co-morbidities. After discussion of risks,  benefits, and alternatives to surgery, the family and/or patient were in agreement to proceed with surgery. The goals of surgery would be to provide adequate pain relief and allow for mobilization. Plan for surgery is L hip hemiarthroplasty today, 06/07/2023. 2. NPO until OR 3. Hold anticoagulation in advance of OR   Lorri Rota   06/07/2023 7:24 AM

## 2023-06-07 NOTE — Anesthesia Postprocedure Evaluation (Signed)
 Anesthesia Post Note  Patient: MARDELLE PANDOLFI  Procedure(s) Performed: HEMIARTHROPLASTY (BIPOLAR) HIP, POSTERIOR APPROACH FOR FRACTURE (Left)  Patient location during evaluation: PACU Anesthesia Type: Spinal Level of consciousness: awake and alert and patient cooperative Pain management: pain level controlled Vital Signs Assessment: post-procedure vital signs reviewed and stable Respiratory status: spontaneous breathing, nonlabored ventilation and respiratory function stable Cardiovascular status: blood pressure returned to baseline and stable Postop Assessment: adequate PO intake, no headache, no backache and spinal receding Anesthetic complications: no   No notable events documented.   Last Vitals:  Vitals:   06/07/23 1450 06/07/23 1515  BP: (!) 114/102 120/66  Pulse: 81 83  Resp: 17 18  Temp: 36.8 C   SpO2: 98% 100%    Last Pain:  Vitals:   06/07/23 1515  TempSrc:   PainSc: Asleep                 Alfonso Ruths

## 2023-06-07 NOTE — H&P (Signed)
 H&P reviewed. No significant changes noted.

## 2023-06-07 NOTE — Op Note (Signed)
 DATE OF SURGERY: 06/07/2023  PREOPERATIVE DIAGNOSIS: Left femoral neck fracture  POSTOPERATIVE DIAGNOSIS: Left femoral neck fracture  PROCEDURE: Left hip hemiarthroplasty  SURGEON: Cleotilde Dago, MD  ASSISTANTS:   EBL: 150 cc  COMPONENTS:  Stryker - Accolade C Size 5 Stem Stryker - Unitrax 45mm head with neutral neck  INDICATIONS: Melissa [name] is a 89 y.o. female who sustained a displaced femoral neck fracture after a fall. Risks and benefits of hip hemiarthroplasty were explained to the patient and/or family. Risks include but are not limited to bleeding, infection, injury to tissues, nerves, vessels, periprosthetic infection, dislocation, limb length discrepancy and risks of anesthesia. The patient and/or family understands these risks, has completed an informed consent and wishes to proceed.   PROCEDURE:  The patient was identified in the preoperative holding area and the operative extremity was marked.  The patient was then transferred to the operating room suite and mobilized from the hospital gurney to the operating room table. Spinal anesthesia was administered without complication. The patient was then transitioned to a lateral position.  All bony prominences were padded per protocol.  An axillary roll was placed.  Careful attention was paid to the contralateral side peroneal nerve, which was free from pressure with use of appropriate padding and blankets. A time-out was performed to confirm the patient's identity and the correct laterality of surgery. The patient was then prepped and draped in the usual sterile fashion. Appropriate pre-operative antibiotics were administered. Tranexamic acid  was administered preoperatively.    An incision that centered on the posterior tip of the greater trochanter with a posterior curve was made. Dissection was carried down through the subcutaneous tissue.  Careful attention was made to maintain hemostasis using electrocautery.  Dissection brought  us  to the level of the deep fascia where the gluteus maximus muscle and proximal portion of the IT band were identified.  The proximal region of the IT band was incised in linear fashion and this incision was extended proximally in a curvilinear fashion to split the gluteus maximus muscle parallel to its fibers to minimize bleeding.  This was accomplished using a combination bovie electrocautery as well as blunt dissection.  The trochanteric bursa was then visualized and dissected from anterior to posterior. A blunt homan retractor was placed beneath the abductors. The piriformis tendon and short external rotators were visualized. Bovie electrocautery was used to cut these with the capsule as one L-shaped flap. This was tagged at the corner with #5 Ethibond. At this point, the femoral neck fracture was visualized. An oscillating saw was used to make a new neck cut approximately 15mm above the lesser tuberosity with the use of a neck cut guide. The head was then freed from its remaining soft tissue attachments and measured. The head trial was then inserted into the acetabulum and sized appropriately.    We then turned our attention to preparing the femoral canal. First, a box cut was performed utilizing the box osteotome. A canal finder was inserted by hand and sequential broaching was then performed. The calcar planer was inserted onto the broach and used to smooth the calcar appropriately.  A trial stem, neutral neck, and head were inserted into the acetabulum and placed through range of motion. Intraoperative radiographs were taken to assess hardware position and leg lengths. The hip was again dislocated and the femoral trial components were removed. An appropriately sized cement restrictor was placed. The canal was irrigated with pulse lavage and dried. A cement gun was used to  place cement into the femoral canal. Cement was then pressurized. The actual stem was inserted into the femoral canal and then driven  onto the calcar. Position was held until the cement hardened. The trial head was then again inserted on the femoral component and found to be appropriate. Trial was removed and the permanent head/neck was Morse tapered onto the femoral stem and then reduced into the acetabulum.    The hip stability and length were reassessed and found to be satisfactory.  The wound was then copiously irrigated with normal saline solution. The tagged sutures of the capsule and piriformis were sewn to the gluteus medius tendon. This adequately closed the hip capsule. The IT band and gluteus maximus fascia were then closed with 0-Vicryl in a running, locked fashion. A mixture of Exparel  and bupivicaine was administered.  The subdermal layer was closed with 2-0 Vicryl in a buried interrupted fashion. Skin was approximated with staples.  Sterile dressing was applied. An abduction pillow was placed. The patient was mobilized from the lateral position back to supine on the operating room table and then awakened from general anesthesia without complication.   POSTOPERATIVE PLAN: The patient will be WBAT on operative extremity. Lovenox  40mg /day x 4 weeks to start on POD#1. IV Abx x 24 hours. PT/OT on POD#1. Posterior hip precautions.

## 2023-06-07 NOTE — Progress Notes (Signed)
 PT Cancellation Note  Patient Details Name: Melissa [name] MRN: [id number] DOB: Feb 22, 1928   Cancelled Treatment:    Reason Eval/Treat Not Completed: Patient not medically ready.  PT consult received.  Chart reviewed.  Pt currently off floor (per chart plan for surgery today).  Will need new PT order post op.  Amador Junes, PT 06/07/23, 11:16 AM

## 2023-06-07 NOTE — Anesthesia Procedure Notes (Signed)
 Spinal  Patient location during procedure: OR Start time: 06/07/2023 12:47 PM End time: 06/07/2023 12:58 PM Reason for block: surgical anesthesia Staffing Performed: resident/CRNA  Anesthesiologist: Dorothey Gate, MD Resident/CRNA: Philippe Brazen, CRNA Performed by: Philippe Brazen, CRNA Authorized by: Mazzoni, Andrea, MD   Preanesthetic Checklist Completed: patient identified, IV checked, site marked, risks and benefits discussed, surgical consent, monitors and equipment checked, pre-op evaluation and timeout performed Spinal Block Patient position: sitting Prep: DuraPrep Patient monitoring: heart rate, cardiac monitor, continuous pulse ox and blood pressure Approach: midline Location: L3-4 Injection technique: single-shot Needle Needle type: Sprotte and Pencan  Needle gauge: 24 G Needle length: 9 cm Assessment Sensory level: T4 Events: CSF return

## 2023-06-07 NOTE — Transfer of Care (Signed)
 Immediate Anesthesia Transfer of Care Note  Patient: Melissa [name]  Procedure(s) Performed: HEMIARTHROPLASTY (BIPOLAR) HIP, POSTERIOR APPROACH FOR FRACTURE (Left)  Patient Location: PACU  Anesthesia Type:MAC and Spinal  Level of Consciousness: drowsy and patient cooperative  Airway & Oxygen Therapy: Patient Spontanous Breathing and Patient connected to face mask oxygen  Post-op Assessment: Report given to RN and Post -op Vital signs reviewed and stable  Post vital signs: Reviewed and stable  Last Vitals:  Vitals Value Taken Time  BP 114/102 06/07/23 1450  Temp 36.8 C 06/07/23 1450  Pulse 81 06/07/23 1450  Resp 17 06/07/23 1450  SpO2 100%     Last Pain:  Vitals:   06/07/23 1059  TempSrc: Temporal  PainSc: Asleep         Complications: No notable events documented.

## 2023-06-07 NOTE — Progress Notes (Addendum)
 [facility]  - [place] [facility]   PATIENT NAME: Melissa [name]    MR#:  [id number]  DATE OF BIRTH:  [birth year]  SUBJECTIVE:  husband at bedside who gives history patient has advanced dementia unable to give any history review system.  Came in with fall mechanical at [facility]. NPO for surgery   VITALS:  Blood pressure 133/64, pulse 87, temperature 98.6 F (37 C), temperature source Temporal, resp. rate 14, height 5\' 5"  (1.651 m), weight 50 kg, SpO2 (!) 89%.  PHYSICAL EXAMINATION:  limited GENERAL:  89 y.o.-year-old patient with no acute distress.  LUNGS: Normal breath sounds bilaterally CARDIOVASCULAR: S1, S2 normal.  ABDOMEN: Soft, nontender, nondistended. EXTREMITIES left leg restricted ROM NEUROLOGIC: grossly nonfocal. Unable to assess due to patient's dementia and participation LABORATORY PANEL:  CBC Recent Labs  Lab 06/07/23 0417  WBC 12.7*  HGB 8.5*  HCT 24.3*  PLT 322    Chemistries  Recent Labs  Lab 06/06/23 1602 06/07/23 0417  NA 138 139  K 3.8 3.8  CL 107 106  CO2 23 25  GLUCOSE 109* 113*  BUN 21 22  CREATININE 0.69 0.68  CALCIUM  8.1* 8.5*  AST 28  --   ALT 23  --   ALKPHOS 69  --   BILITOT 1.0  --     RADIOLOGY:  DG Chest Portable 1 View Result Date: 06/06/2023 CLINICAL DATA:  Fall.  Left hip fracture.  Preop exam. EXAM: PORTABLE CHEST 1 VIEW COMPARISON:  06/22/2021 radiograph FINDINGS: Stable cardiomegaly. Aortic atherosclerotic calcification. Low lung volumes accentuate pulmonary vascularity. Bibasilar atelectasis. No pleural effusion or pneumothorax. No displaced rib fractures. Nodular opacity projecting over the right lower lung is favored osseous in nature possibly related to remote rib fracture. Right reverse TSA. L1 vertebroplasty. IMPRESSION: Low lung volumes with bibasilar atelectasis. Electronically Signed   By: Rozell Cornet M.D.   On: 06/06/2023 18:54   DG HIPS BILAT WITH PELVIS MIN 5 VIEWS Result Date:  06/06/2023 CLINICAL DATA:  Witnessed fall EXAM: DG HIP (WITH OR WITHOUT PELVIS) 5V BILAT COMPARISON:  Right hip and pelvis radiograph dated 06/22/2021 FINDINGS: Postsurgical changes from right proximal femoral fixation. Hardware appears intact and well seated. Minimally displaced transcervical fracture of the left proximal femur with varus angulation. Old right inferior pubic ramus fracture. Unchanged irregularity of the left inferior pubic ramus. Prior augmentation of the sacrum. IMPRESSION: Minimally displaced transcervical fracture of the left proximal femur. Electronically Signed   By: Limin  Xu M.D.   On: 06/06/2023 15:53   CT Cervical Spine Wo Contrast Result Date: 06/06/2023 CLINICAL DATA:  89 year old female status post fall striking head. EXAM: CT CERVICAL SPINE WITHOUT CONTRAST TECHNIQUE: Multidetector CT imaging of the cervical spine was performed without intravenous contrast. Multiplanar CT image reconstructions were also generated. RADIATION DOSE REDUCTION: This exam was performed according to the departmental dose-optimization program which includes automated exposure control, adjustment of the mA and/or kV according to patient size and/or use of iterative reconstruction technique. COMPARISON:  Head CT today.  Cervical spine CT 06/28/2021. FINDINGS: Mild motion artifact today. Alignment: Stable cervical lordosis, cervicothoracic junction and posterior element alignment. Skull base and vertebrae: Chronic osteopenia. Visualized skull base is intact. No atlanto-occipital dissociation. C1 and C2 appear intact and aligned. No acute osseous abnormality identified. Soft tissues and spinal canal: No prevertebral fluid or swelling. No visible canal hematoma. Motion artifact, negative visible noncontrast neck soft tissues. Disc levels: Cervical spine degeneration superimposed on chronic degenerative ankylosis of  the C3-C4 facets. Bulky chronic disc osteophyte complex at C5-C6. Upper chest: Chronic T2  compression fracture partially visible and appears stable. Developing upper thoracic posterior element ankylosis including that level. Partially calcified apical lung scarring appears stable. IMPRESSION: 1. Mild motion artifact. No acute traumatic injury identified in the cervical spine. 2. Chronic osteopenia, cervical spine degeneration, chronic T2 compression fracture. Electronically Signed   By: Marlise Simpers M.D.   On: 06/06/2023 11:56   CT Head Wo Contrast Result Date: 06/06/2023 CLINICAL DATA:  89 year old female status post fall striking head. EXAM: CT HEAD WITHOUT CONTRAST TECHNIQUE: Contiguous axial images were obtained from the base of the skull through the vertex without intravenous contrast. RADIATION DOSE REDUCTION: This exam was performed according to the departmental dose-optimization program which includes automated exposure control, adjustment of the mA and/or kV according to patient size and/or use of iterative reconstruction technique. COMPARISON:  Head CT and brain MRI 06/22/2022. FINDINGS: Brain: Stable cerebral volume. No midline shift, acute ventriculomegaly, mass effect, evidence of mass lesion, intracranial hemorrhage or evidence of cortically based acute infarction. Stable gray-white differentiation including chronic heterogeneity in the deep gray nuclei, chronic right occipital lobe encephalomalacia, small chronic cerebellar infarcts. Vascular: Calcified atherosclerosis at the skull base. No suspicious intracranial vascular hyperdensity. Skull: Stable osteopenia. Intact. No acute osseous abnormality identified. Sinuses/Orbits: Visualized paranasal sinuses and mastoids are stable and well aerated. Other: No acute orbit or scalp soft tissue injury identified. IMPRESSION: 1. No acute intracranial abnormality or acute traumatic injury identified. 2. Stable non contrast CT appearance of chronic ischemic disease. Electronically Signed   By: Marlise Simpers M.D.   On: 06/06/2023 11:54    Assessment and  Plan  Melissa [name] is a 89 year old female with history of dementia, iron deficiency anemia, depression, GERD, history of chronic compression fracture, who presents emergency department for chief concerns of a witnessed fall at facility. ([place] [facility])  X-ray bilateral hip with pelvis Minimally displaced transcervical fracture of the left proximal femur.  Closed left femoral fracture (HCC) post mechanical fall --prn pain meds --Ortho consult with Dr Carmela Chin Qadir Folks--to the OR today --N.p.o. after midnight   Depression --- mirtazapine     Mixed hyperlipidemia --on statins   Vascular dementia with behavior disturbance (HCC) --Appears to be at patient's baseline  Nutrition Status: Nutrition Problem: Moderate Malnutrition Etiology: chronic illness (dementia) Signs/Symptoms: mild fat depletion, moderate fat depletion, moderate muscle depletion, severe muscle depletion Interventions: Ensure Enlive (each supplement provides 350kcal and 20 grams of protein), Magic cup, MVI  Osteoporosis -Calcium , vitamin D  supplementation resumed   Constipation --Home MiraLAX , Docusate sodium     Procedures: Family communication :Husband at bedside Consults :Orthopedic CODE STATUS: DNR/DNI DVT Prophylaxis :per ortho Level of care: Med-Surg Status is: Inpatient Remains inpatient appropriate because: Hip fracture requiring surgery, PT/OT to see and TOC for d/c planning    TOTAL TIME TAKING CARE OF THIS PATIENT: 50 minutes.  >50% time spent on counselling and coordination of care  Note: This dictation was prepared with Dragon dictation along with smaller phrase technology. Any transcriptional errors that result from this process are unintentional.  [name] M.D    [facility]   CC: Primary care physician; Valrie Gehrig, MD

## 2023-06-07 NOTE — Anesthesia Preprocedure Evaluation (Addendum)
 Anesthesia Evaluation  Patient identified by MRN, date of birth, ID bandGeneral Assessment Comment:Pt drowsy, responds to commands  Reviewed: Allergy & Precautions, NPO status , Patient's Chart, lab work & pertinent test results  History of Anesthesia Complications (+) PONV and history of anesthetic complications  Airway Mallampati: III   Neck ROM: Full    Dental no notable dental hx.    Pulmonary neg pulmonary ROS   Pulmonary exam normal breath sounds clear to auscultation       Cardiovascular Normal cardiovascular exam Rhythm:Regular Rate:Normal  ECG 06/06/23:  Sinus rhythm with Premature supraventricular complexes Anteroseptal infarct , age undetermined   Neuro/Psych  Headaches PSYCHIATRIC DISORDERS     Dementia CVA (residual left-sided weakness; wheelchair bound)    GI/Hepatic ,GERD  ,,  Endo/Other  negative endocrine ROS    Renal/GU negative Renal ROS     Musculoskeletal  (+) Arthritis ,    Abdominal   Peds  Hematology  (+) Blood dyscrasia, anemia   Anesthesia Other Findings   Reproductive/Obstetrics                             Anesthesia Physical Anesthesia Plan  ASA: 3  Anesthesia Plan: Spinal   Post-op Pain Management:    Induction: Intravenous  PONV Risk Score and Plan: 4 or greater and Propofol  infusion, TIVA, Treatment may vary due to age or medical condition, Ondansetron  and Dexamethasone   Airway Management Planned: Natural Airway and Nasal Cannula  Additional Equipment:   Intra-op Plan:   Post-operative Plan:   Informed Consent: I have reviewed the patients History and Physical, chart, labs and discussed the procedure including the risks, benefits and alternatives for the proposed anesthesia with the patient or authorized representative who has indicated his/her understanding and acceptance.   Patient has DNR.  Discussed DNR with power of attorney and Suspend  DNR.   Consent reviewed with POA  Plan Discussed with: CRNA  Anesthesia Plan Comments: (History and consent obtained from patient's husband Autry Legions, at bedside. Plan for spinal and GA with natural airway, LMA/GETA backup.  Patient's husband consented for risks of anesthesia including but not limited to:  - adverse reactions to medications - damage to eyes, teeth, lips or other oral mucosa - nerve damage due to positioning  - sore throat or hoarseness - headache, bleeding, infection, nerve damage 2/2 spinal - damage to heart, brain, nerves, lungs, other parts of body or loss of life  Informed patient's husband about role of CRNA in peri- and intra-operative care; he voiced understanding.)        Anesthesia Quick Evaluation

## 2023-06-07 NOTE — Progress Notes (Signed)
 OT Cancellation Note  Patient Details Name: SHARNAY CASHION MRN: [id number] DOB: 1928-11-05   Cancelled Treatment:    Reason Eval/Treat Not Completed: Other (comment). Pt NPO, pending surgical repair of femoral fracture. Will sign off and await new OT consult following surgery.   Devi Hopman R., MPH, MS, OTR/L ascom [phone] 06/07/23, 8:03 AM

## 2023-06-08 DIAGNOSIS — S72002A Fracture of unspecified part of neck of left femur, initial encounter for closed fracture: Secondary | ICD-10-CM | POA: Diagnosis not present

## 2023-06-08 DIAGNOSIS — F32A Depression, unspecified: Secondary | ICD-10-CM | POA: Diagnosis not present

## 2023-06-08 DIAGNOSIS — E43 Unspecified severe protein-calorie malnutrition: Secondary | ICD-10-CM | POA: Diagnosis not present

## 2023-06-08 DIAGNOSIS — F01518 Vascular dementia, unspecified severity, with other behavioral disturbance: Secondary | ICD-10-CM | POA: Diagnosis not present

## 2023-06-08 LAB — URINALYSIS, COMPLETE (UACMP) WITH MICROSCOPIC
Bilirubin Urine: NEGATIVE
Glucose, UA: NEGATIVE mg/dL
Hgb urine dipstick: NEGATIVE
Ketones, ur: 5 mg/dL — AB
Nitrite: NEGATIVE
Protein, ur: 30 mg/dL — AB
Specific Gravity, Urine: 1.023 (ref 1.005–1.030)
pH: 5 (ref 5.0–8.0)

## 2023-06-08 MED ORDER — ENOXAPARIN SODIUM 40 MG/0.4ML IJ SOSY
40.0000 mg | PREFILLED_SYRINGE | INTRAMUSCULAR | Status: DC
  Administered 2023-06-08 – 2023-06-11 (×4): 40 mg via SUBCUTANEOUS
  Filled 2023-06-08 (×4): qty 0.4

## 2023-06-08 NOTE — Progress Notes (Signed)
 Subjective: 1 Day Post-Op Procedure(s) (LRB): HEMIARTHROPLASTY (BIPOLAR) HIP, POSTERIOR APPROACH FOR FRACTURE (Left) Patient reports pain as mild and moderate.   Patient seen in rounds with Dr. Daun Epstein. Patient is lethargic this AM. Mummbling answers to provider questions. Difficult to understand  We will start therapy today.  Plan is to go Skilled nursing facility after hospital stay.  Objective: Vital signs in last 24 hours: Temp:  [97.5 F (36.4 C)-98.7 F (37.1 C)] 98.7 F (37.1 C) (04/19 0738) Pulse Rate:  [79-96] 81 (04/19 0738) Resp:  [14-28] 17 (04/19 0738) BP: (105-133)/(44-103) 112/49 (04/19 0738) SpO2:  [89 %-100 %] 98 % (04/19 0738)  Intake/Output from previous day:  Intake/Output Summary (Last 24 hours) at 06/08/2023 1135 Last data filed at 06/07/2023 1757 Gross per 24 hour  Intake 1225 ml  Output 100 ml  Net 1125 ml    Intake/Output this shift: No intake/output data recorded.  Labs: Recent Labs    06/06/23 1602 06/07/23 0417  HGB 8.5* 8.5*   Recent Labs    06/06/23 1602 06/07/23 0417  WBC 19.3* 12.7*  RBC 2.59* 2.53*  HCT 25.8* 24.3*  PLT 315 322   Recent Labs    06/06/23 1602 06/07/23 0417  NA 138 139  K 3.8 3.8  CL 107 106  CO2 23 25  BUN 21 22  CREATININE 0.69 0.68  GLUCOSE 109* 113*  CALCIUM  8.1* 8.5*   No results for input(s): "LABPT", "INR" in the last 72 hours.  EXAM General - Patient is relatively lethargic, difficulty opening eyes during visit, able to mumble answers to provider questions Extremity - Neurologically intact Neurovascular intact Sensation intact distally Intact pulses distally No cellulitis present Compartment soft Able to wiggle toes Dressing - dressing C/D/I and no drainage Motor Function - intact, able to slightly move foot and toes on exam when prompted  Past Medical History:  Diagnosis Date   Arthritis    Chest pain    a. 05/2015 - post-prandial.   GERD (gastroesophageal reflux disease)    a. takes  Omeprazole  daily, prev seen by D. Brodie.   Heart murmur    a. 05/2013 Echo: EF 55-60%, gr2 DD, mild AI/MR.   History of blood transfusion    no abnormal reaction noted    History of migraine headaches    a. last 10/2013   Iron deficiency anemia    Joint pain    PONV (postoperative nausea and vomiting)    Rectocele 2011    Assessment/Plan: 1 Day Post-Op Procedure(s) (LRB): HEMIARTHROPLASTY (BIPOLAR) HIP, POSTERIOR APPROACH FOR FRACTURE (Left) Principal Problem:   Closed left femoral fracture (HCC) Active Problems:   GERD   Constipation   OP (osteoporosis)   Depression   Protein-calorie malnutrition, severe (HCC)   Vascular dementia with behavior disturbance (HCC)   Mixed hyperlipidemia   Malnutrition of moderate degree   Closed left hip fracture, initial encounter (HCC)  Estimated body mass index is 18.34 kg/m as calculated from the following:   Height as of this encounter: 5\' 5"  (1.651 m).   Weight as of this encounter: 50 kg.  Patient will continue to work with physical therapy Hip Preacutions and in knee immobilizer Discussed with the patient continuing to utilize ice over the bandage Honeycomb bandage intact and clean VSS Labs show most recent hemoglobin of 8.5 DC planning to SNF  Weight-Bearing as tolerated to left leg Stay in knee immobilizer DVT Prophylaxis: Lovenox  and SCDs  Patient will follow-up with [facility] clinic orthopedics in 2 weeks  for re-imaging and reevaluation   Wadie Guile, PA-C [facility] 06/08/2023, 11:35 AM

## 2023-06-08 NOTE — Evaluation (Signed)
 Physical Therapy Evaluation Patient Details Name: Melissa [name] MRN: [id number] DOB: May 03, 1928 Today's Date: 06/08/2023  History of Present Illness  presented to ER status post fall from lift chair; admitted for management of displaced femoral neck fracture, s/p L hip hemiarthroplasty (WBAT, post THPs) 06/07/23  Clinical Impression  Patient remains sleeping upon initial entry to room, but does briefly open eyes with max (and constant) verbal, tactile cuing from therapist.  Does grunt to acknowledge therapist voice intermittently, but does not produce meaningful verbalizations throughout session (with the exception of moaning in response to movement of L hip/pain).  PAINAD scale 6/10; improved with rest and repositioning.  Globally weak and deconditioned throughout all extremities; intermittently resistant to movement attempts (esp with bilat knee flexion) and lacking neutral DF R ankle.  Currently requiring dep assist for any/all movement and repositioning efforts; anticipate need for +2/mechanical lift for OOB attempts at this time. Would benefit from skilled PT to address above deficits and promote optimal return to PLOF.; recommend post-acute PT follow up as indicated by interdisciplinary care team, per trial, pending ability to participate/progress in way to promote return to baseline PLOF.   May benefit from palliative care consult given limited baseline (cognitively and functionally) and limited ability to actively participate with skilled therapy services; discussed with primary MD.         If plan is discharge home, recommend the following: Two people to help with walking and/or transfers;Two people to help with bathing/dressing/bathroom   Can travel by private vehicle        Equipment Recommendations    Recommendations for Other Services       Functional Status Assessment Patient has had a recent decline in their functional status and/or demonstrates limited ability to make  significant improvements in function in a reasonable and predictable amount of time     Precautions / Restrictions Precautions Precautions: Fall;Posterior Hip Restrictions Weight Bearing Restrictions Per Provider Order: Yes LLE Weight Bearing Per Provider Order: Weight bearing as tolerated      Mobility  Bed Mobility Overal bed mobility: Needs Assistance             General bed mobility comments: scooting up, repositioning in bed, dep assist; no attempts at active assist with movement efforts    Transfers                   General transfer comment: unsafe/unable    Ambulation/Gait               General Gait Details: unsafe/unable; non-ambulatory at baseline  Stairs            Wheelchair Mobility     Tilt Bed    Modified Rankin (Stroke Patients Only)       Balance                                             Pertinent Vitals/Pain Pain Assessment Pain Assessment: PAINAD Breathing: occasional labored breathing, short period of hyperventilation Negative Vocalization: occasional moan/groan, low speech, negative/disapproving quality Facial Expression: facial grimacing Body Language: tense, distressed pacing, fidgeting Consolability: distracted or reassured by voice/touch PAINAD Score: 6 Pain Intervention(s): Limited activity within patient's tolerance, Monitored during session, Repositioned    Home Living Family/patient expects to be discharged to:: Skilled nursing facility  Additional Comments: Pt has resided in the [facility] healthcare center for the last 2 years.  Husband lives in a home in the [facility] [address] community and visits her daily.    Prior Function Prior Level of Function : Needs assist             Mobility Comments: Per husband, WC level as sole/primary mobility, using hoyer lift for transfers between seating surfaces; does tolerate sitting OOB in recliner during daytime hours.   Intermittently feeds self when set up       Extremity/Trunk Assessment   Upper Extremity Assessment Upper Extremity Assessment: Generalized weakness (grossly 3-/5 throughout elbows, wrists and hands; generally resistant to movement of bilat shoulders)    Lower Extremity Assessment Lower Extremity Assessment: Generalized weakness (grossly 2+ to 3-/5 throughout, generally resistant to knee flexion bilat.  R ankle DF unable to achieve neutral position.  L LE largely limited by pain)       Communication   Communication Factors Affecting Communication:  (minimal attempts at verbalization, largely limited go intermittent grunting to acknowledge therapist and moaning in response to pain)    Cognition Arousal: Lethargic Behavior During Therapy: Flat affect   PT - Cognitive impairments: No family/caregiver present to determine baseline                       PT - Cognition Comments: Patient does briefly open eyes with max cuing from therapist; unable to maintain during session.  Minimally interactive with therapist in a meaningful way Following commands: Impaired       Cueing Cueing Techniques: Gestural cues, Tactile cues, Verbal cues     General Comments      Exercises Other Exercises Other Exercises: Supine LE therex, 1x10, act assist/passive ROM as tolerated.  L hip largely limited by pain; generally resistant to knee flexion bilat Other Exercises: L KI removed (and reapplied) in session for skin inspection and repositioning; skin intact and without redness   Assessment/Plan    PT Assessment Patient needs continued PT services (per trial, pending ability to participate/progress in way to promote return to PLOF)  PT Problem List Decreased strength;Decreased cognition;Decreased range of motion;Decreased activity tolerance;Decreased balance;Decreased mobility;Decreased knowledge of use of DME;Decreased safety awareness;Decreased knowledge of precautions;Pain       PT  Treatment Interventions DME instruction;Gait training;Stair training;Functional mobility training;Therapeutic activities;Therapeutic exercise;Balance training;Cognitive remediation;Patient/family education    PT Goals (Current goals can be found in the Care Plan section)  Acute Rehab PT Goals PT Goal Formulation: Patient unable to participate in goal setting Time For Goal Achievement: 06/22/23 Potential to Achieve Goals: Fair Additional Goals Additional Goal #1: Rolling and repositioning in bed for pressure relief, peri-care, max assist    Frequency Min 3X/week     Co-evaluation               AM-PAC PT "6 Clicks" Mobility  Outcome Measure Help needed turning from your back to your side while in a flat bed without using bedrails?: Total Help needed moving from lying on your back to sitting on the side of a flat bed without using bedrails?: Total Help needed moving to and from a bed to a chair (including a wheelchair)?: Total Help needed standing up from a chair using your arms (e.g., wheelchair or bedside chair)?: Total Help needed to walk in hospital room?: Total Help needed climbing 3-5 steps with a railing? : Total 6 Click Score: 6    End of Session   Activity  Tolerance: Patient limited by pain;Other (comment) (limited by baseline cognition and wakefulness/alertness) Patient left: in bed;with call bell/phone within reach;with bed alarm set   PT Visit Diagnosis: History of falling (Z91.81);Muscle weakness (generalized) (M62.81);Pain Pain - Right/Left: Left Pain - part of body: Hip    Time: 1610-9604 PT Time Calculation (min) (ACUTE ONLY): 15 min   Charges:   PT Evaluation $PT Eval Moderate Complexity: 1 Mod   PT General Charges $$ ACUTE PT VISIT: 1 Visit        Khaniyah Bezek H. Bevin Bucks, PT, DPT, NCS 06/08/23, 3:05 PM [phone]

## 2023-06-08 NOTE — Progress Notes (Signed)
 [facility]  - [place] at [facility]   PATIENT NAME: Melissa [name]    MR#:  [id number]  DATE OF BIRTH:  12/09/1928  SUBJECTIVE:  No family when seen earleir patient has advanced dementia unable to give any history review system.  Came in with fall mechanical at [facility].  POD#1   VITALS:  Blood pressure (!) 112/49, pulse 81, temperature 98.7 F (37.1 C), resp. rate 17, height 5\' 5"  (1.651 m), weight 50 kg, SpO2 98%.  PHYSICAL EXAMINATION:  limited GENERAL:  89 y.o.-year-old patient with no acute distress. Frail, thin LUNGS: Normal breath sounds bilaterally CARDIOVASCULAR: S1, S2 normal.  EXTREMITIES left leg restricted ROM NEUROLOGIC: grossly nonfocal. Unable to assess due to patient's dementia and participation LABORATORY PANEL:  CBC Recent Labs  Lab 06/07/23 0417  WBC 12.7*  HGB 8.5*  HCT 24.3*  PLT 322    Chemistries  Recent Labs  Lab 06/06/23 1602 06/07/23 0417  NA 138 139  K 3.8 3.8  CL 107 106  CO2 23 25  GLUCOSE 109* 113*  BUN 21 22  CREATININE 0.69 0.68  CALCIUM  8.1* 8.5*  AST 28  --   ALT 23  --   ALKPHOS 69  --   BILITOT 1.0  --     RADIOLOGY:  DG Pelvis 1-2 Views Result Date: 06/07/2023 CLINICAL DATA:  Status post left hip hemiarthroplasty. EXAM: PELVIS - 1-2 VIEW COMPARISON:  bilateral hip radiographs 06/06/2023 FINDINGS: There is diffuse decreased bone mineralization. Interval left hip bipolar hemiarthroplasty. Partial visualization of right proximal femoral cephalomedullary nail fixation. No peripheral lucency is seen to indicate heart failure loosening. Expected postoperative changes of lateral left hip subcutaneous fat and left hip intra-articular air. Redemonstration of cement augmentation of the sacrum. IMPRESSION: Interval left hip bipolar hemiarthroplasty with expected postoperative changes. Electronically Signed   By: Bertina Broccoli M.D.   On: 06/07/2023 16:55   DG Pelvis Portable Result Date: 06/07/2023 CLINICAL DATA:   Intraoperative film for left hip hemiarthroplasty. EXAM: PORTABLE PELVIS 1-2 VIEWS COMPARISON:  Pelvis and bilateral hip radiographs 06/06/2023 FINDINGS: There is diffuse decreased bone mineralization. The patient is undergoing left hip arthroplasty with placement of the left femoral stem. The left femoral head and neck have been removed. Partial visualization of right cephalomedullary nail fixation of the proximal right femur. Redemonstration of cement augmentation of the sacrum. IMPRESSION: Intraoperative film during left hip arthroplasty. Electronically Signed   By: Bertina Broccoli M.D.   On: 06/07/2023 16:54   DG Chest Portable 1 View Result Date: 06/06/2023 CLINICAL DATA:  Fall.  Left hip fracture.  Preop exam. EXAM: PORTABLE CHEST 1 VIEW COMPARISON:  06/22/2021 radiograph FINDINGS: Stable cardiomegaly. Aortic atherosclerotic calcification. Low lung volumes accentuate pulmonary vascularity. Bibasilar atelectasis. No pleural effusion or pneumothorax. No displaced rib fractures. Nodular opacity projecting over the right lower lung is favored osseous in nature possibly related to remote rib fracture. Right reverse TSA. L1 vertebroplasty. IMPRESSION: Low lung volumes with bibasilar atelectasis. Electronically Signed   By: Rozell Cornet M.D.   On: 06/06/2023 18:54   DG HIPS BILAT WITH PELVIS MIN 5 VIEWS Result Date: 06/06/2023 CLINICAL DATA:  Witnessed fall EXAM: DG HIP (WITH OR WITHOUT PELVIS) 5V BILAT COMPARISON:  Right hip and pelvis radiograph dated 06/22/2021 FINDINGS: Postsurgical changes from right proximal femoral fixation. Hardware appears intact and well seated. Minimally displaced transcervical fracture of the left proximal femur with varus angulation. Old right inferior pubic ramus fracture. Unchanged irregularity of the left inferior pubic  ramus. Prior augmentation of the sacrum. IMPRESSION: Minimally displaced transcervical fracture of the left proximal femur. Electronically Signed   By: Limin   Xu M.D.   On: 06/06/2023 15:53    Assessment and Plan  Melissa [name] is a 89 year old female with history of dementia, iron deficiency anemia, depression, GERD, history of chronic compression fracture, who presents emergency department for chief concerns of a witnessed fall at facility. ([facility])  X-ray bilateral hip with pelvis Minimally displaced transcervical fracture of the left proximal femur.  Closed left femoral fracture (HCC) post mechanical fall --prn pain meds --Ortho consult with Dr Carmela Chin Precious Gilchrest--to the OR today --4/19--resting. Pt sleeps most time of the day and wakes up for meals. She is Long term at TL and WC bound   Depression --- mirtazapine     Mixed hyperlipidemia --on statins   Vascular dementia with behavior disturbance (HCC) --Appears to be at patient's baseline  Nutrition Status: Nutrition Problem: Moderate Malnutrition Etiology: chronic illness (dementia) Signs/Symptoms: mild fat depletion, moderate fat depletion, moderate muscle depletion, severe muscle depletion Interventions: Ensure Enlive (each supplement provides 350kcal and 20 grams of protein), Magic cup, MVI  Osteoporosis -Calcium , vitamin D  supplementation resumed   Constipation --Home MiraLAX , Docusate sodium     Procedures:Left hip hemiarthroplasty  Family communication :none today Consults :Orthopedic CODE STATUS: DNR/DNI DVT Prophylaxis :per ortho Level of care: Med-Surg Status is: Inpatient Remains inpatient appropriate because: Hip fracture requiring surgery, PT/OT to see and TOC for d/c planning    TOTAL TIME TAKING CARE OF THIS PATIENT:45 minutes.  >50% time spent on counselling and coordination of care  Note: This dictation was prepared with Dragon dictation along with smaller phrase technology. Any transcriptional errors that result from this process are unintentional.  [name] M.D    [facility]   CC: Primary care physician; Valrie Gehrig, MD

## 2023-06-08 NOTE — Progress Notes (Signed)
 PT Cancellation Note  Patient Details Name: Melissa [name] MRN: [id number] DOB: Jul 17, 1928   Cancelled Treatment:    Reason Eval/Treat Not Completed: Fatigue/lethargy limiting ability to participate (Consult received and chart reviewed.  Patient sleeping soundly; unable to awaken with max verbal/tactile cues, washcloth to face, bright lights.  Per husband, tends to awaken mid/late afternoon per normal routine; will re-attempt at later time this PM)  Of note, per husband, patient is long-term resident of [facility] facility (x2 years); [facility] dependant as primary mobility using hoyer to/from seating surfaces.  Sleeps quite a bit during the day, but does tend to awaken for meal times (intermittently feeds self with built-up utensils versus husband assists).   Reise Gladney H. Bevin Bucks, PT, DPT, NCS 06/08/23, 10:47 AM [phone]

## 2023-06-09 DIAGNOSIS — E440 Moderate protein-calorie malnutrition: Secondary | ICD-10-CM

## 2023-06-09 DIAGNOSIS — S72112A Displaced fracture of greater trochanter of left femur, initial encounter for closed fracture: Secondary | ICD-10-CM | POA: Diagnosis not present

## 2023-06-09 DIAGNOSIS — F01518 Vascular dementia, unspecified severity, with other behavioral disturbance: Secondary | ICD-10-CM | POA: Diagnosis not present

## 2023-06-09 MED ORDER — SODIUM CHLORIDE 0.9 % IV BOLUS
250.0000 mL | Freq: Once | INTRAVENOUS | Status: AC
  Administered 2023-06-09: 250 mL via INTRAVENOUS

## 2023-06-09 MED ORDER — ONDANSETRON HCL 4 MG/2ML IJ SOLN
4.0000 mg | Freq: Four times a day (QID) | INTRAMUSCULAR | Status: DC | PRN
  Administered 2023-06-12: 4 mg via INTRAVENOUS
  Filled 2023-06-09: qty 2

## 2023-06-09 MED ORDER — CEFAZOLIN SODIUM-DEXTROSE 1-4 GM/50ML-% IV SOLN
1.0000 g | Freq: Four times a day (QID) | INTRAVENOUS | Status: AC
  Administered 2023-06-09 (×2): 1 g via INTRAVENOUS
  Filled 2023-06-09 (×3): qty 50

## 2023-06-09 MED ORDER — BISACODYL 10 MG RE SUPP
10.0000 mg | Freq: Every day | RECTAL | Status: DC
  Administered 2023-06-09 – 2023-06-10 (×2): 10 mg via RECTAL
  Filled 2023-06-09 (×2): qty 1

## 2023-06-09 MED ORDER — HYDROMORPHONE HCL 1 MG/ML IJ SOLN
0.2000 mg | INTRAMUSCULAR | Status: DC | PRN
  Administered 2023-06-12: 0.2 mg via INTRAVENOUS
  Filled 2023-06-09: qty 1

## 2023-06-09 MED ORDER — ONDANSETRON HCL 4 MG PO TABS: 4.0000 mg | ORAL_TABLET | Freq: Four times a day (QID) | ORAL | Status: DC | PRN

## 2023-06-09 MED ORDER — OXYCODONE HCL 5 MG PO TABS: 5.0000 mg | ORAL_TABLET | ORAL | Status: DC | PRN

## 2023-06-09 MED ORDER — METOCLOPRAMIDE HCL 5 MG/ML IJ SOLN: 5.0000 mg | Freq: Three times a day (TID) | INTRAMUSCULAR | Status: DC | PRN

## 2023-06-09 MED ORDER — METOCLOPRAMIDE HCL 5 MG PO TABS: 5.0000 mg | ORAL_TABLET | Freq: Three times a day (TID) | ORAL | Status: DC | PRN

## 2023-06-09 MED ORDER — ACETAMINOPHEN 500 MG PO TABS
1000.0000 mg | ORAL_TABLET | Freq: Four times a day (QID) | ORAL | Status: DC
  Administered 2023-06-09 – 2023-06-11 (×8): 1000 mg via ORAL
  Filled 2023-06-09 (×10): qty 2

## 2023-06-09 MED ORDER — TRAMADOL HCL 50 MG PO TABS: 50.0000 mg | ORAL_TABLET | Freq: Four times a day (QID) | ORAL | Status: DC | PRN

## 2023-06-09 MED ORDER — OXYCODONE HCL 5 MG PO TABS: 2.5000 mg | ORAL_TABLET | ORAL | Status: DC | PRN

## 2023-06-09 NOTE — Plan of Care (Signed)
  Problem: Clinical Measurements: Goal: Will remain free from infection Outcome: Progressing   Problem: Nutrition: Goal: Adequate nutrition will be maintained Outcome: Progressing   Problem: Activity: Goal: Risk for activity intolerance will decrease Outcome: Progressing   Problem: Pain Managment: Goal: General experience of comfort will improve and/or be controlled Outcome: Progressing

## 2023-06-09 NOTE — Progress Notes (Addendum)
 [facility]  - [place] at [facility]   PATIENT NAME: Melissa [name]    MR#:  [id number]  DATE OF BIRTH:  1928-04-22  SUBJECTIVE:  Husband at bedside patient has advanced dementia unable to give any history review system.  Came in with fall mechanical at [facility].  POD#2 Soft bp--giving fluid bolus   VITALS:  Blood pressure (!) 91/43, pulse 74, temperature 98.7 F (37.1 C), temperature source Axillary, resp. rate 16, height 5\' 5"  (1.651 m), weight 50 kg, SpO2 97%.  PHYSICAL EXAMINATION:  limited GENERAL:  89 y.o.-year-old patient with no acute distress. Frail, thin LUNGS: Normal breath sounds bilaterally CARDIOVASCULAR: S1, S2 normal.  EXTREMITIES left leg restricted ROM NEUROLOGIC:  Unable to assess due to patient's dementia and participation LABORATORY PANEL:  CBC Recent Labs  Lab 06/07/23 0417  WBC 12.7*  HGB 8.5*  HCT 24.3*  PLT 322    Chemistries  Recent Labs  Lab 06/06/23 1602 06/07/23 0417  NA 138 139  K 3.8 3.8  CL 107 106  CO2 23 25  GLUCOSE 109* 113*  BUN 21 22  CREATININE 0.69 0.68  CALCIUM  8.1* 8.5*  AST 28  --   ALT 23  --   ALKPHOS 69  --   BILITOT 1.0  --     RADIOLOGY:  DG Pelvis 1-2 Views Result Date: 06/07/2023 CLINICAL DATA:  Status post left hip hemiarthroplasty. EXAM: PELVIS - 1-2 VIEW COMPARISON:  bilateral hip radiographs 06/06/2023 FINDINGS: There is diffuse decreased bone mineralization. Interval left hip bipolar hemiarthroplasty. Partial visualization of right proximal femoral cephalomedullary nail fixation. No peripheral lucency is seen to indicate heart failure loosening. Expected postoperative changes of lateral left hip subcutaneous fat and left hip intra-articular air. Redemonstration of cement augmentation of the sacrum. IMPRESSION: Interval left hip bipolar hemiarthroplasty with expected postoperative changes. Electronically Signed   By: Bertina Broccoli M.D.   On: 06/07/2023 16:55   DG Pelvis Portable Result  Date: 06/07/2023 CLINICAL DATA:  Intraoperative film for left hip hemiarthroplasty. EXAM: PORTABLE PELVIS 1-2 VIEWS COMPARISON:  Pelvis and bilateral hip radiographs 06/06/2023 FINDINGS: There is diffuse decreased bone mineralization. The patient is undergoing left hip arthroplasty with placement of the left femoral stem. The left femoral head and neck have been removed. Partial visualization of right cephalomedullary nail fixation of the proximal right femur. Redemonstration of cement augmentation of the sacrum. IMPRESSION: Intraoperative film during left hip arthroplasty. Electronically Signed   By: Bertina Broccoli M.D.   On: 06/07/2023 16:54    Assessment and Plan  Melissa [name] is a 89 year old female with history of dementia, iron deficiency anemia, depression, GERD, history of chronic compression fracture, who presents emergency department for chief concerns of a witnessed fall at facility. ([facility])  X-ray bilateral hip with pelvis Minimally displaced transcervical fracture of the left proximal femur.  Closed left femoral fracture (HCC) post mechanical fall --prn pain meds --Ortho consult with Dr Carmela Chin Melissa [name]--to the OR today --4/19--resting. Pt sleeps most time of the day and wakes up for meals. She is Long term at TL and WC bound --4/20--sleeping. Soft BP. Will give NS bolus   Depression --mirtazapine     Mixed hyperlipidemia --on statins   Vascular dementia with behavior disturbance (HCC) --Appears to be at patient's baseline --poor long term prognosis  Nutrition Status: Nutrition Problem: Moderate Malnutrition Etiology: chronic illness (dementia) Signs/Symptoms: mild fat depletion, moderate fat depletion, moderate muscle depletion, severe muscle depletion Interventions: Ensure Enlive (each supplement provides 350kcal and 20  grams of protein), Magic cup, MVI  Osteoporosis -Calcium , vitamin D  supplementation resumed   Constipation --Home MiraLAX , Docusate sodium      Procedures:Left hip hemiarthroplasty  Family communication Husband at bedside Consults :Orthopedic CODE STATUS: DNR/DNI DVT Prophylaxis :per ortho Level of care: Med-Surg Status is: Inpatient Remains inpatient appropriate because: Hip fracture requiring surgery Pt can return back to TL    TOTAL TIME TAKING CARE OF THIS PATIENT:35 minutes.  >50% time spent on counselling and coordination of care  Note: This dictation was prepared with Dragon dictation along with smaller phrase technology. Any transcriptional errors that result from this process are unintentional.  Melissa [name] M.D    [facility]   CC: Primary care physician; Melissa Gehrig, MD

## 2023-06-09 NOTE — Progress Notes (Signed)
 Subjective: 2 Days Post-Op Procedure(s) (LRB): HEMIARTHROPLASTY (BIPOLAR) HIP, POSTERIOR APPROACH FOR FRACTURE (Left) Patient is asleep with provider entrance. Able to open eyes, mumbles relative inaudible answers with questioning and provider prompts Patient seen in rounds with Dr. Daun Epstein. We will continue therapy today.  Plan is to go Skilled nursing facility after hospital stay.  Objective: Vital signs in last 24 hours: Temp:  [97.8 F (36.6 C)-99 F (37.2 C)] 98.2 F (36.8 C) (04/20 0346) Pulse Rate:  [79-87] 82 (04/20 0346) Resp:  [18] 18 (04/20 0346) BP: (107-125)/(48-67) 125/52 (04/20 0346) SpO2:  [99 %-100 %] 100 % (04/20 0346)  Intake/Output from previous day:  Intake/Output Summary (Last 24 hours) at 06/09/2023 0903 Last data filed at 06/09/2023 0414 Gross per 24 hour  Intake 38.11 ml  Output --  Net 38.11 ml    Intake/Output this shift: No intake/output data recorded.  Labs: Recent Labs    06/06/23 1602 06/07/23 0417  HGB 8.5* 8.5*   Recent Labs    06/06/23 1602 06/07/23 0417  WBC 19.3* 12.7*  RBC 2.59* 2.53*  HCT 25.8* 24.3*  PLT 315 322   Recent Labs    06/06/23 1602 06/07/23 0417  NA 138 139  K 3.8 3.8  CL 107 106  CO2 23 25  BUN 21 22  CREATININE 0.69 0.68  GLUCOSE 109* 113*  CALCIUM  8.1* 8.5*   No results for input(s): "LABPT", "INR" in the last 72 hours.  EXAM General - Patient is relatively lethargic, difficulty opening eyes during visit, able to mumble answers to provider questions Extremity - Neurologically intact Neurovascular intact Sensation intact distally Intact pulses distally No cellulitis present Compartment soft Able to wiggle toes Dressing - dressing C/D/I and no drainage Motor Function - intact, able to slightly move toes  Past Medical History:  Diagnosis Date   Arthritis    Chest pain    a. 05/2015 - post-prandial.   GERD (gastroesophageal reflux disease)    a. takes Omeprazole  daily, prev seen by D. Brodie.    Heart murmur    a. 05/2013 Echo: EF 55-60%, gr2 DD, mild AI/MR.   History of blood transfusion    no abnormal reaction noted    History of migraine headaches    a. last 10/2013   Iron deficiency anemia    Joint pain    PONV (postoperative nausea and vomiting)    Rectocele 2011    Assessment/Plan: 2 Days Post-Op Procedure(s) (LRB): HEMIARTHROPLASTY (BIPOLAR) HIP, POSTERIOR APPROACH FOR FRACTURE (Left) Principal Problem:   Closed left femoral fracture (HCC) Active Problems:   GERD   Constipation   OP (osteoporosis)   Depression   Protein-calorie malnutrition, severe (HCC)   Vascular dementia with behavior disturbance (HCC)   Mixed hyperlipidemia   Malnutrition of moderate degree   Closed left hip fracture, initial encounter (HCC)  Estimated body mass index is 18.34 kg/m as calculated from the following:   Height as of this encounter: 5\' 5"  (1.651 m).   Weight as of this encounter: 50 kg.  Patient will continue to work with physical therapy Hip Preacutions and in knee immobilizer Honeycomb bandage intact and clean VSS Labs show most recent hemoglobin of 8.5 DC planning to SNF  Weight-Bearing as tolerated to left leg Stay in knee immobilizer DVT Prophylaxis: Lovenox  and SCDs  Patient will follow-up with [facility] clinic orthopedics in 2 weeks for re-imaging and reevaluation   Wadie Guile, PA-C [facility] 06/09/2023, 9:03 AM

## 2023-06-10 ENCOUNTER — Encounter: Payer: Self-pay | Admitting: Orthopedic Surgery

## 2023-06-10 DIAGNOSIS — S72112A Displaced fracture of greater trochanter of left femur, initial encounter for closed fracture: Secondary | ICD-10-CM | POA: Diagnosis not present

## 2023-06-10 MED ORDER — ENOXAPARIN SODIUM 40 MG/0.4ML IJ SOSY: 40.0000 mg | PREFILLED_SYRINGE | INTRAMUSCULAR | 0 refills | Status: DC

## 2023-06-10 MED ORDER — TRAMADOL HCL 50 MG PO TABS: 50.0000 mg | ORAL_TABLET | Freq: Four times a day (QID) | ORAL | 0 refills | Status: DC | PRN

## 2023-06-10 MED ORDER — OXYCODONE HCL 5 MG PO TABS: 2.5000 mg | ORAL_TABLET | ORAL | 0 refills | Status: DC | PRN

## 2023-06-10 NOTE — Plan of Care (Signed)
 ?  Problem: Clinical Measurements: ?Goal: Will remain free from infection ?Outcome: Progressing ?  ?

## 2023-06-10 NOTE — Plan of Care (Signed)
  Problem: Education: Goal: Knowledge of General Education information will improve Description: Including pain rating scale, medication(s)/side effects and non-pharmacologic comfort measures Outcome: Not Progressing   Problem: Health Behavior/Discharge Planning: Goal: Ability to manage health-related needs will improve Outcome: Progressing   Problem: Clinical Measurements: Goal: Ability to maintain clinical measurements within normal limits will improve Outcome: Progressing Goal: Will remain free from infection Outcome: Progressing Goal: Diagnostic test results will improve Outcome: Progressing Goal: Respiratory complications will improve Outcome: Progressing Goal: Cardiovascular complication will be avoided Outcome: Progressing   Problem: Activity: Goal: Risk for activity intolerance will decrease Outcome: Progressing   Problem: Nutrition: Goal: Adequate nutrition will be maintained Outcome: Progressing   Problem: Coping: Goal: Level of anxiety will decrease Outcome: Progressing   Problem: Elimination: Goal: Will not experience complications related to bowel motility Outcome: Progressing Goal: Will not experience complications related to urinary retention Outcome: Progressing   Problem: Pain Managment: Goal: General experience of comfort will improve and/or be controlled Outcome: Progressing   Problem: Safety: Goal: Ability to remain free from injury will improve Outcome: Progressing   Problem: Skin Integrity: Goal: Risk for impaired skin integrity will decrease Outcome: Progressing

## 2023-06-10 NOTE — TOC Initial Note (Signed)
 Transition of Care [facility]) - Initial/Assessment Note    Patient Details  Name: Melissa [name] MRN: [id number] Date of Birth: 10/28/1928  Transition of Care [facility]) CM/SW Contact:    Alexandra Ice, RN Phone Number: 06/10/2023, 3:01 PM  Clinical Narrative:                 Spoke with liaison with [facility], they are able to accept patient back, they will need FL2, DC summary, and orders. Patient will be ready for discharge tomorrow per Dr. Gordy Lauber.   Expected Discharge Plan: Long Term Nursing Home Barriers to Discharge: Continued Medical Work up   Patient Goals and CMS Choice Patient states their goals for this hospitalization and ongoing recovery are:: Advanced dementia, per spouse would like her to return to [facility] - [facility]          Expected Discharge Plan and Services     Post Acute Care Choice:  [facility] LTC) Living arrangements for the past 2 months: Skilled Nursing Facility ([facility] [place])                                      Prior Living Arrangements/Services Living arrangements for the past 2 months: Skilled Nursing Facility ([facility] [place]) Lives with:: Facility Resident Patient language and need for interpreter reviewed:: Yes        Need for Family Participation in Patient Care: Yes (Comment) Care giver support system in place?: Yes (comment)   Criminal Activity/Legal Involvement Pertinent to Current Situation/Hospitalization: No - Comment as needed  Activities of Daily Living   ADL Screening (condition at time of admission) Independently performs ADLs?: No Does the patient have a NEW difficulty with bathing/dressing/toileting/self-feeding that is expected to last >3 days?: No Does the patient have a NEW difficulty with getting in/out of bed, walking, or climbing stairs that is expected to last >3 days?: No Does the patient have a NEW difficulty with communication that is expected to last >3 days?: No Is the patient  deaf or have difficulty hearing?: No Does the patient have difficulty seeing, even when wearing glasses/contacts?: No Does the patient have difficulty concentrating, remembering, or making decisions?: Yes  Permission Sought/Granted                  Emotional Assessment       Orientation: : Oriented to Self   Psych Involvement: No (comment)  Admission diagnosis:  Closed left femoral fracture (HCC) [S72.92XA] Closed head injury, initial encounter [S09.90XA] Closed left hip fracture, initial encounter (HCC) [S72.002A] Patient Active Problem List   Diagnosis Date Noted   Malnutrition of moderate degree 06/07/2023   Closed left hip fracture, initial encounter (HCC) 06/07/2023   Closed left femoral fracture (HCC) 06/06/2023   Mixed hyperlipidemia 05/24/2023   OAB (overactive bladder) 05/24/2023   Dysphagia 05/24/2023   Hemiparesis affecting left side as late effect of stroke (HCC) 04/22/2023   Vascular dementia with behavior disturbance (HCC) 04/22/2023   Depression 06/22/2022   Protein-calorie malnutrition, severe (HCC) 06/22/2022   Peripheral neuropathy 06/22/2021   Hardening of the aorta (main artery of the heart) (HCC) 07/14/2020   AI (aortic incompetence) 12/30/2017   OP (osteoporosis) 12/30/2017   ANEMIA, IRON DEFICIENCY 04/05/2009   GERD 04/05/2009   Constipation 04/05/2009   PCP:  Valrie Gehrig, MD Pharmacy:   [facility] [address], [place] - [phone]  [address] [place] [address] Phone: [phone] Fax: [phone]  [id number] [place] [facility] - [address] [place] [address] Phone: [phone] Fax: [phone]  [facility] - [place], [place] - [address] [place] [address] Phone: [phone] Fax: [phone]     Social Drivers of Health (SDOH) Social History: SDOH Screenings   Food Insecurity: Patient Unable To Answer  (06/06/2023)  Housing: Patient Unable To Answer (06/06/2023)  Transportation Needs: Patient Unable To Answer (06/06/2023)  Utilities: Patient Unable To Answer (06/06/2023)  Social Connections: Unknown (06/06/2023)  Tobacco Use: Low Risk  (06/07/2023)   SDOH Interventions:     Readmission Risk Interventions     No data to display

## 2023-06-10 NOTE — Progress Notes (Signed)
  PROGRESS NOTE    Melissa [name]  UJW:[id number] DOB: 1928-07-31 DOA: 06/06/2023 PCP: Valrie Gehrig, MD  [id number]/[id number]-AA  LOS: 4 days   Brief hospital course:   Assessment & Plan: Melissa [name] is a 89 year old female with history of dementia, iron deficiency anemia, depression, GERD, history of chronic compression fracture, who presents emergency department for chief concerns of a witnessed fall at facility. ([facility])  She is Long term at [facility] and [facility] bound   X-ray bilateral hip with pelvis Minimally displaced transcervical fracture of the left proximal femur.   Closed left femoral fracture (HCC) post mechanical fall S/p left HEMIARTHROPLASTY on 06/07/23 --Hip Preacutions and in knee immobilizer  Weight-Bearing as tolerated to left leg Stay in knee immobilizer DVT Prophylaxis: Lovenox  follow-up with [facility] clinic orthopedics in 2 weeks for re-imaging and reevaluation   Depression --mirtazapine     Mixed hyperlipidemia --cont statin   Vascular dementia with behavior disturbance (HCC) --Appears to be at patient's baseline --poor long term prognosis   Underweight Mod malnutrition --supplements per dietician    Osteoporosis -cont Calcium , vitamin D  supplementation    Constipation --cont bowel regimen    DVT prophylaxis: Lovenox  SQ Code Status: DNR  Family Communication: husband updated at bedside today Level of care: Med-Surg Dispo:   The patient is from: SNF LTC Anticipated d/c is to: SNF LTC Anticipated d/c date is: tomorrow   Subjective and Interval History:  Husband said pt ate purred foods, did vomit back up more solid foods.    Objective: Vitals:   06/10/23 0000 06/10/23 0335 06/10/23 0817 06/10/23 1636  BP: (!) 116/55 (!) 92/32 108/61 (!) 110/54  Pulse: 84 82 91 93  Resp: 18 20 18 16   Temp: 97.8 F (36.6 C) 97.6 F (36.4 C) 97.7 F (36.5 C) 97.8 F (36.6 C)  TempSrc:      SpO2: 98% 93% 90% 97%  Weight:      Height:        Intake/Output  Summary (Last 24 hours) at 06/10/2023 1803 Last data filed at 06/10/2023 5621 Gross per 24 hour  Intake 60 ml  Output 50 ml  Net 10 ml   Filed Weights   06/06/23 1107  Weight: 50 kg    Examination:   Constitutional: NAD CV: No cyanosis.   RESP: normal respiratory effort, on RA SKIN: warm, dry   Data Reviewed: I have personally reviewed labs and imaging studies  Time spent: 35 minutes  Garrison Kanner, MD [facility] If 7PM-7AM, please contact night-coverage 06/10/2023, 6:03 PM

## 2023-06-10 NOTE — Progress Notes (Signed)
 Subjective: 3 Days Post-Op Procedure(s) (LRB): HEMIARTHROPLASTY (BIPOLAR) HIP, POSTERIOR APPROACH FOR FRACTURE (Left) Patient is comfortably resting.  Answering questions slightly.  More alert today. We will continue therapy today.  Plan is to go Skilled nursing facility after hospital stay.  Objective: Vital signs in last 24 hours: Temp:  [97.6 F (36.4 C)-99.1 F (37.3 C)] 97.6 F (36.4 C) (04/21 0335) Pulse Rate:  [74-84] 82 (04/21 0335) Resp:  [16-20] 20 (04/21 0335) BP: (91-130)/(32-76) 92/32 (04/21 0335) SpO2:  [93 %-100 %] 93 % (04/21 0335)  Intake/Output from previous day:  Intake/Output Summary (Last 24 hours) at 06/10/2023 0703 Last data filed at 06/10/2023 0605 Gross per 24 hour  Intake 60 ml  Output 50 ml  Net 10 ml    Intake/Output this shift: No intake/output data recorded.  Labs: No results for input(s): "HGB" in the last 72 hours.  No results for input(s): "WBC", "RBC", "HCT", "PLT" in the last 72 hours.  No results for input(s): "NA", "K", "CL", "CO2", "BUN", "CREATININE", "GLUCOSE", "CALCIUM " in the last 72 hours.  No results for input(s): "LABPT", "INR" in the last 72 hours.  EXAM General - Patient is relatively lethargic, difficulty opening eyes during visit, able to mumble answers to provider questions Extremity - Neurologically intact Neurovascular intact Sensation intact distally Intact pulses distally No cellulitis present Compartment soft Able to wiggle toes Dressing - dressing C/D/I and no drainage Motor Function - intact, able to slightly move toes  Past Medical History:  Diagnosis Date   Arthritis    Chest pain    a. 05/2015 - post-prandial.   GERD (gastroesophageal reflux disease)    a. takes Omeprazole  daily, prev seen by D. Brodie.   Heart murmur    a. 05/2013 Echo: EF 55-60%, gr2 DD, mild AI/MR.   History of blood transfusion    no abnormal reaction noted    History of migraine headaches    a. last 10/2013   Iron deficiency  anemia    Joint pain    PONV (postoperative nausea and vomiting)    Rectocele 2011    Assessment/Plan: 3 Days Post-Op Procedure(s) (LRB): HEMIARTHROPLASTY (BIPOLAR) HIP, POSTERIOR APPROACH FOR FRACTURE (Left) Principal Problem:   Closed left femoral fracture (HCC) Active Problems:   GERD   Constipation   OP (osteoporosis)   Depression   Protein-calorie malnutrition, severe (HCC)   Vascular dementia with behavior disturbance (HCC)   Mixed hyperlipidemia   Malnutrition of moderate degree   Closed left hip fracture, initial encounter (HCC)  Estimated body mass index is 18.34 kg/m as calculated from the following:   Height as of this encounter: 5\' 5"  (1.651 m).   Weight as of this encounter: 50 kg.  Patient will continue to work with physical therapy Hip Preacutions and in knee immobilizer Honeycomb bandage intact and clean VSS Labs show most recent hemoglobin of 8.5 DC planning to SNF  Weight-Bearing as tolerated to left leg Stay in knee immobilizer DVT Prophylaxis: Lovenox  and SCDs  Patient will follow-up with [facility] clinic orthopedics in 2 weeks for re-imaging and reevaluation   Bert Britain, PA-C [facility] 06/10/2023, 7:03 AM

## 2023-06-10 NOTE — Care Management Important Message (Signed)
 Important Message  Patient Details  Name: Melissa [name] MRN: [id number] Date of Birth: 06-05-1928   Important Message Given:  Yes - Medicare IM     Tymira Horkey W, CMA 06/10/2023, 10:31 AM

## 2023-06-10 NOTE — NC FL2 (Signed)
 [place]  MEDICAID FL2 LEVEL OF CARE FORM     IDENTIFICATION  Patient Name: Melissa [name] Birthdate: 1928-07-17 Sex: female Admission Date (Current Location): 06/06/2023  [facility] and [place] Number:  Chiropodist and Address:  [facility], [address] [place], [place] [address]      Provider Number: [id number]  Attending Physician Name and Address:  Garrison Kanner, MD  Relative Name and Phone Number:  Ahnyla Mendel  Spouse  Emergency Contact  [phone]    Current Level of Care: Hospital Recommended Level of Care: Nursing Facility ([facility]) Prior Approval Number:    Date Approved/Denied:   PASRR Number: [id number]  Discharge Plan: Other (Comment) [facility] [place] term care)    Current Diagnoses: Patient Active Problem List   Diagnosis Date Noted   Malnutrition of moderate degree 06/07/2023   Closed left hip fracture, initial encounter (HCC) 06/07/2023   Closed left femoral fracture (HCC) 06/06/2023   Mixed hyperlipidemia 05/24/2023   OAB (overactive bladder) 05/24/2023   Dysphagia 05/24/2023   Hemiparesis affecting left side as late effect of stroke (HCC) 04/22/2023   Vascular dementia with behavior disturbance (HCC) 04/22/2023   Depression 06/22/2022   Protein-calorie malnutrition, severe (HCC) 06/22/2022   Peripheral neuropathy 06/22/2021   Hardening of the aorta (main artery of the heart) (HCC) 07/14/2020   AI (aortic incompetence) 12/30/2017   OP (osteoporosis) 12/30/2017   ANEMIA, IRON DEFICIENCY 04/05/2009   GERD 04/05/2009   Constipation 04/05/2009    Orientation RESPIRATION BLADDER Height & Weight     Self  Normal Incontinent Weight: 50 kg Height:  5\' 5"  (165.1 cm)  BEHAVIORAL SYMPTOMS/MOOD NEUROLOGICAL BOWEL NUTRITION STATUS      Incontinent Diet (Dysphagia II diet)  AMBULATORY STATUS COMMUNICATION OF NEEDS Skin   Total Care Does not communicate Surgical wounds                        Personal Care Assistance Level of Assistance  Total care       Total Care Assistance: Maximum assistance   Functional Limitations Info             SPECIAL CARE FACTORS FREQUENCY   (Returning to long term care, room 515A)                    Contractures Contractures Info: Not present    Additional Factors Info                  Current Medications (06/10/2023):  This is the current hospital active medication list Current Facility-Administered Medications  Medication Dose Route Frequency Provider Last Rate Last Admin   acetaminophen  (TYLENOL ) tablet 1,000 mg  1,000 mg Oral Q6H Lorri Rota, MD   1,000 mg at 06/10/23 1300   atorvastatin  (LIPITOR) tablet 40 mg  40 mg Oral Daily Lorri Rota, MD   40 mg at 06/10/23 0920   bisacodyl  (DULCOLAX) suppository 10 mg  10 mg Rectal Daily Patel, Sona, MD   10 mg at 06/10/23 0920   calcium  carbonate (OS-CAL - dosed in mg of elemental calcium ) tablet 1,250 mg  1 tablet Oral Q breakfast Lorri Rota, MD   1,250 mg at 06/10/23 0920   cholecalciferol  (VITAMIN D3) 25 MCG (1000 UNIT) tablet 1,000 Units  1,000 Units Oral Daily Lorri Rota, MD   1,000 Units at 06/10/23 0920   enoxaparin  (LOVENOX ) injection 40 mg  40 mg Subcutaneous Q24H Patel, Sona,  MD   40 mg at 06/10/23 0919   feeding supplement (ENSURE ENLIVE / ENSURE PLUS) liquid 237 mL  237 mL Oral BID BM Lorri Rota, MD   237 mL at 06/10/23 1300   HYDROmorphone  (DILAUDID ) injection 0.2-0.4 mg  0.2-0.4 mg Intravenous Q4H PRN Lorri Rota, MD       metoCLOPramide  (REGLAN ) tablet 5-10 mg  5-10 mg Oral Q8H PRN Lorri Rota, MD       Or   metoCLOPramide  (REGLAN ) injection 5-10 mg  5-10 mg Intravenous Q8H PRN Lorri Rota, MD       mirtazapine  (REMERON ) tablet 7.5 mg  7.5 mg Oral QHS Lorri Rota, MD   7.5 mg at 06/09/23 2126   multivitamin with minerals tablet 1 tablet  1 tablet Oral Daily Lorri Rota, MD   1 tablet at 06/10/23 4098   ondansetron  (ZOFRAN ) tablet 4 mg  4 mg Oral Q6H  PRN Lorri Rota, MD       Or   ondansetron  (ZOFRAN ) injection 4 mg  4 mg Intravenous Q6H PRN Lorri Rota, MD       oxyCODONE  (Oxy IR/ROXICODONE ) immediate release tablet 2.5-5 mg  2.5-5 mg Oral Q4H PRN Lorri Rota, MD       oxyCODONE  (Oxy IR/ROXICODONE ) immediate release tablet 5-10 mg  5-10 mg Oral Q4H PRN Lorri Rota, MD       saccharomyces boulardii (FLORASTOR) capsule 250 mg  250 mg Oral Daily Lorri Rota, MD   250 mg at 06/10/23 0920   traMADol  (ULTRAM ) tablet 50 mg  50 mg Oral Q6H PRN Lorri Rota, MD         Discharge Medications: Please see discharge summary for a list of discharge medications.  Relevant Imaging Results:  Relevant Lab Results:   Additional Information Going to room [place], room 515A  Alexandra Ice, RN

## 2023-06-11 ENCOUNTER — Inpatient Hospital Stay

## 2023-06-11 ENCOUNTER — Other Ambulatory Visit: Payer: Self-pay

## 2023-06-11 DIAGNOSIS — S72112A Displaced fracture of greater trochanter of left femur, initial encounter for closed fracture: Secondary | ICD-10-CM | POA: Diagnosis not present

## 2023-06-11 DIAGNOSIS — R579 Shock, unspecified: Secondary | ICD-10-CM | POA: Diagnosis not present

## 2023-06-11 LAB — CBC
HCT: 12.7 % — CL (ref 36.0–46.0)
Hemoglobin: 4.3 g/dL — CL (ref 12.0–15.0)
MCH: 32.3 pg (ref 26.0–34.0)
MCHC: 33.9 g/dL (ref 30.0–36.0)
MCV: 95.5 fL (ref 80.0–100.0)
Platelets: 316 10*3/uL (ref 150–400)
RBC: 1.33 MIL/uL — ABNORMAL LOW (ref 3.87–5.11)
RDW: 19.9 % — ABNORMAL HIGH (ref 11.5–15.5)
WBC: 16.7 10*3/uL — ABNORMAL HIGH (ref 4.0–10.5)
nRBC: 1.3 % — ABNORMAL HIGH (ref 0.0–0.2)

## 2023-06-11 LAB — BASIC METABOLIC PANEL WITH GFR
Anion gap: 8 (ref 5–15)
BUN: 53 mg/dL — ABNORMAL HIGH (ref 8–23)
CO2: 23 mmol/L (ref 22–32)
Calcium: 8.1 mg/dL — ABNORMAL LOW (ref 8.9–10.3)
Chloride: 112 mmol/L — ABNORMAL HIGH (ref 98–111)
Creatinine, Ser: 0.78 mg/dL (ref 0.44–1.00)
GFR, Estimated: >60 mL/min (ref >60)
Glucose, Bld: 175 mg/dL — ABNORMAL HIGH (ref 70–99)
Potassium: 4.2 mmol/L (ref 3.5–5.1)
Sodium: 143 mmol/L (ref 135–145)

## 2023-06-11 LAB — CULTURE, BLOOD (ROUTINE X 2)
Culture: NO GROWTH
Culture: NO GROWTH
Special Requests: ADEQUATE

## 2023-06-11 LAB — HEMOGLOBIN AND HEMATOCRIT, BLOOD
HCT: 14.8 % — CL (ref 36.0–46.0)
Hemoglobin: 4.9 g/dL — CL (ref 12.0–15.0)

## 2023-06-11 LAB — HEMOGLOBIN: Hemoglobin: 8.1 g/dL — ABNORMAL LOW (ref 12.0–15.0)

## 2023-06-11 LAB — PREPARE RBC (CROSSMATCH)

## 2023-06-11 LAB — GLUCOSE, CAPILLARY: Glucose-Capillary: 163 mg/dL — ABNORMAL HIGH (ref 70–99)

## 2023-06-11 LAB — MAGNESIUM: Magnesium: 2.2 mg/dL (ref 1.7–2.4)

## 2023-06-11 MED ORDER — FENTANYL CITRATE PF 50 MCG/ML IJ SOSY: 12.5000 ug | PREFILLED_SYRINGE | Freq: Once | INTRAMUSCULAR | Status: DC

## 2023-06-11 MED ORDER — ALBUMIN HUMAN 25 % IV SOLN
12.5000 g | Freq: Once | INTRAVENOUS | Status: AC
  Administered 2023-06-11: 12.5 g via INTRAVENOUS
  Filled 2023-06-11 (×2): qty 50

## 2023-06-11 MED ORDER — SODIUM CHLORIDE 0.9 % IV SOLN: INTRAVENOUS | Status: DC

## 2023-06-11 MED ORDER — SODIUM CHLORIDE 0.9% IV SOLUTION: Freq: Once | INTRAVENOUS | Status: AC

## 2023-06-11 MED ORDER — NOREPINEPHRINE 16 MG/250ML-% IV SOLN
0.0000 ug/min | INTRAVENOUS | Status: DC
  Administered 2023-06-11: 4 ug/min via INTRAVENOUS
  Administered 2023-06-12: 20 ug/min via INTRAVENOUS
  Filled 2023-06-11 (×2): qty 250

## 2023-06-11 MED ORDER — IOHEXOL 350 MG/ML SOLN
100.0000 mL | Freq: Once | INTRAVENOUS | Status: AC | PRN
  Administered 2023-06-12: 100 mL via INTRAVENOUS

## 2023-06-11 MED ORDER — CHLORHEXIDINE GLUCONATE CLOTH 2 % EX PADS
6.0000 | MEDICATED_PAD | Freq: Every day | CUTANEOUS | Status: DC
  Administered 2023-06-12 (×2): 6 via TOPICAL

## 2023-06-11 MED ORDER — TRAMADOL HCL 50 MG PO TABS: 50.0000 mg | ORAL_TABLET | Freq: Four times a day (QID) | ORAL | Status: DC | PRN

## 2023-06-11 MED ORDER — SODIUM CHLORIDE 0.9 % IV BOLUS
1000.0000 mL | Freq: Once | INTRAVENOUS | Status: AC
  Administered 2023-06-11: 1000 mL via INTRAVENOUS

## 2023-06-11 NOTE — Progress Notes (Signed)
 Unable to find a suitable vein for PIV,  both arms assessed w/ US . RN at bedside aware. To notify MD.

## 2023-06-11 NOTE — Progress Notes (Signed)
 Nutrition Follow-up  DOCUMENTATION CODES:   Underweight, Non-severe (moderate) malnutrition in context of chronic illness  INTERVENTION:   -MVI with minerals daily -Ensure Enlive po BID, each supplement provides 350 kcal and 20 grams of protein.  -Magic cup BID with meals, each supplement provides 290 kcal and 9 grams of protein  -Feeding assistance with meals  NUTRITION DIAGNOSIS:   Moderate Malnutrition related to chronic illness (dementia) as evidenced by mild fat depletion, moderate fat depletion, moderate muscle depletion, severe muscle depletion.  Ongoing  GOAL:   Patient will meet greater than or equal to 90% of their needs  Progressing   MONITOR:   PO intake, Supplement acceptance, Diet advancement  REASON FOR ASSESSMENT:   Consult Assessment of nutrition requirement/status, Hip fracture protocol  ASSESSMENT:   Pt with history of dementia, iron deficiency anemia, depression, GERD, history of chronic compression fracture, who presents for chief concerns of a witnessed fall at facility.  4/18- s/p Left hip hemiarthroplasty   Reviewed I/O's: +485 ml x 24 hours and +1.3 L since admission  Pt unavailable at time of visit. Attempted to speak with pt via call to hospital room phone, however, unable to reach.   Per PT notes, pt sleeps a lot during the day at baseline, but often awakens at meal times and needs assistance for meals.   Per otho notes, pt with bloody stool last night with history of anemia. Per RN, pt was too lethargic to take meds or eating breakfast this morning.   Pt currently on a dysphagia 2 diet. Noted meal completions 50-100%.   No new wt since admission.   Per TOC notes, plan to discharge back ot SNF once medically stable.   RD wound not recommend alternative means of nutrition and hydration due to advanced age and dementia as this would not enhance pt's quality of life.   Medications reviewed and include vitamin D3, remeron , florastor, and  0.9% sodium chloride  infusion @ 75 ml/hr.   Labs reviewed.  Diet Order:   Diet Order             Diet NPO time specified  Diet effective midnight           DIET DYS 2 Room service appropriate? Yes with Assist; Fluid consistency: Thin  Diet effective now                   EDUCATION NEEDS:   No education needs have been identified at this time  Skin:  Skin Assessment: Reviewed RN Assessment  Last BM:  Unknown  Height:   Ht Readings from Last 1 Encounters:  06/06/23 5\' 5"  (1.651 m)    Weight:   Wt Readings from Last 1 Encounters:  06/06/23 50 kg    Ideal Body Weight:  56.8 kg  BMI:  Body mass index is 18.34 kg/m.  Estimated Nutritional Needs:   Kcal:  1500-1700  Protein:  75-90 grams  Fluid:  1.5-1.7 L    Herschel Lords, RD, LDN, CDCES Registered Dietitian III Certified Diabetes Care and Education Specialist If unable to reach this RD, please use "RD Inpatient" group chat on secure chat between hours of 8am-4 pm daily

## 2023-06-11 NOTE — TOC Progression Note (Signed)
 Transition of Care [facility]) - Progression Note    Patient Details  Name: JESENIA SPERA MRN: [id number] Date of Birth: 07/23/1928  Transition of Care [facility]) CM/SW Contact  Alexandra Ice, RN Phone Number: 06/11/2023, 4:20 PM  Clinical Narrative:    Patient had bloody stool this morning, Hgb 4, not medically ready. Spouse declined scopy to evaluate for GI bleed. She received 2-units pRBCs today.    Expected Discharge Plan: Long Term Nursing Home Barriers to Discharge: Continued Medical Work up  Expected Discharge Plan and Services     Post Acute Care Choice:  [facility] L[facility]) Living arrangements for the past 2 months: Skilled Nursing Facility ([facility] [place])                                       Social Determinants of Health (SDOH) Interventions SDOH Screenings   Food Insecurity: Patient Unable To Answer (06/06/2023)  Housing: Patient Unable To Answer (06/06/2023)  Transportation Needs: Patient Unable To Answer (06/06/2023)  Utilities: Patient Unable To Answer (06/06/2023)  Social Connections: Unknown (06/06/2023)  Tobacco Use: Low Risk  (06/07/2023)    Readmission Risk Interventions     No data to display

## 2023-06-11 NOTE — Progress Notes (Addendum)
 Notified Ouma NP about patient's low blood pressure and no iv access. IV team was consulted, unable to gain an access. Patient is confused, answers to verbal stimuli. Patient is tachypneic, on 2 liters of oxygen. Critical nurse was at bedside to assess for iv access.  Awaiting orders. Patient continues to bloody bowel movements.  Patient will be transferred to critical care for further treatment.

## 2023-06-11 NOTE — Progress Notes (Signed)
   06/11/23 2132  Assess: MEWS Score  BP (!) 74/52  MAP (mmHg) (!) 59  Pulse Rate (!) 119  Resp (!) 22  SpO2 100 %  O2 Device Nasal Cannula  O2 Flow Rate (L/min) 2 L/min  Assess: MEWS Score  MEWS Temp 0  MEWS Systolic 2  MEWS Pulse 2  MEWS RR 1  MEWS LOC 0  MEWS Score 5  MEWS Score Color Red  Assess: if the MEWS score is Yellow or Red  Were vital signs accurate and taken at a resting state? Yes  Does the patient meet 2 or more of the SIRS criteria? No  MEWS guidelines implemented  Yes, yellow  Treat  MEWS Interventions Considered administering scheduled or prn medications/treatments as ordered  Take Vital Signs  Increase Vital Sign Frequency  Yellow: Q2hr x1, continue Q4hrs until patient remains green for 12hrs  Escalate  MEWS: Escalate Yellow: Discuss with charge nurse and consider notifying provider and/or RRT  Notify: Charge Nurse/RN  Name of Charge Nurse/RN Notified [facility]  Provider Notification  Provider Name/Title Ouma NP  Date Provider Notified 06/11/23  Time Provider Notified 2000  Method of Notification Call  Notification Reason Change in status  Provider response En route  Date of Provider Response 06/11/23  Assess: SIRS CRITERIA  SIRS Temperature  0  SIRS Respirations  1  SIRS Pulse 1  SIRS WBC 0  SIRS Score Sum  2   Patient transferred to critical care.

## 2023-06-11 NOTE — Consult Note (Signed)
 NAME:  Melissa [name], MRN:  [id number], DOB:  25-Jan-1929, LOS: 5 ADMISSION DATE:  06/06/2023, CONSULTATION DATE:  06/11/23 REFERRING MD:  Enolia Hartmann, NP, CHIEF COMPLAINT: Fall    History of Present Illness:  89 yo F presenting to [facility] ED on 06/06/23 for evaluation after a fall.  History provided per chart review as patient is unable to provide history and family is not present. Patient was in a mechanical wheelchair when she had a witnessed fall.   ED course: On arrival, patient unable to give any history. Vitals stable with marginal BP on RA. Labs significant for leukocytosis. Imaging revealed a closed left femoral fracture. Imaging revealed Orthopedic surgery will take the patient to the OR 4/18 and [facility] consulted for admission  Most Recent Significant labs: (Labs/ Imaging personally reviewed) I, Recardo Canal Rust[place]ster, AGACNP-BC, personally viewed and interpreted this ECG. EKG Interpretation: Date: 06/06/23, EKG Time: 16:41, Rate: 87, Rhythm: NSR, QRS Axis: normal, Intervals: normal, ST/T Wave abnormalities: none, Narrative Interpretation: NSR Chemistry: Na+: 148, K+: 5.6, BUN/Cr.: 72/ 1.66, Serum CO2/ AG: 12/17 Hematology: WBC: 25.5, Hgb: 7.0,  Lactic: >9.0  CXR 06/06/23: low lung volumes with bibasilar atelectasis. CT head wo contrast 06/06/23: no acute intracranial abnormality CT cervical spine wo contrast 06/06/23:  Mild motion artifact. No acute traumatic injury identified in the cervical spine. Chronic osteopenia, cervical spine degeneration, chronic T2 compression fracture.  PCCM consulted for assistance in management and monitoring due to hemorrhagic shock secondary to suspected GIB requiring urgent central line placement and vasopressor support.  Pertinent  Medical History  Dementia Iron deficiency anemia Depression GERD HFpEF CVA  Significant Hospital Events: Including procedures, antibiotic start and stop dates in addition to other pertinent events   06/06/23: Admit to  inpatient with hip fracture after mechanical fall from wheelchair. 06/07/23: Hemiarthroplasty performed 06/11/23: Suspected Lower Diverticular bleed. Husband reported extensive GI workup previously and would not want further scope. Hgb dropped to 4.3. Patient received 2u pRBC's. Overnight IV access lost, hypotensive, transfer to ICU for central line placement and vasopressors. PCCM consulted.  Interim History / Subjective:  Patient responds to her name, but otherwise only moans.  Objective   Blood pressure (!) 70/38, pulse (!) 118, temperature 97.6 F (36.4 C), resp. rate (!) 22, height 5\' 5"  (1.651 m), weight 50 kg, SpO2 100%.        Intake/Output Summary (Last 24 hours) at 06/11/2023 2338 Last data filed at 06/11/2023 1201 Gross per 24 hour  Intake 484.58 ml  Output --  Net 484.58 ml   Filed Weights   06/06/23 1107  Weight: 50 kg    Examination: General: Adult female, critically ill, lying in bed, moaning HEENT: MM pink/moist, anicteric, atraumatic, neck supple Neuro: lethargic, unable to follow commands, PERRL +3, CV: s1s2 RRR, ST on monitor, no r/m/g Pulm: Regular, non labored on  2 L [place], breath sounds diminished throughout GI: soft, rounded, non tender, bs x 4, small- moderate maroon stool noted Skin: ecchymosis scattered noted Extremities: warm/dry, pulses + 2 R/P, +1 generalized edema noted  Resolved Hospital Problem list     Assessment & Plan:  Acute Lower Gastrointestinal Bleed suspect secondary to diverticular Hemorrhagic Shock Acute on Chronic anemia - initiate Protonix  IV BID - Maintain up to date type & screen, Transfuse 3rd unit - continuous cardiac monitoring - NPO - Monitor for s/s of bleeding - Daily CBC, PT/ INR monitoring PRN, serial H&H - Transfuse for Hgb <7 - start levophed  drip, wean as tolerated to  maintain MAP > 65 - CT angio GIB once stabilized  Acute Kidney Injury suspect pre-renal secondary to hemorrhagic shock Hyperkalemia AGMA Baseline  Cr: 0.6-0.7, Cr overnight:1.66 - Strict I/O's: alert provider if UOP < 0.5 mL/kg/hr - gentle IVF hydration  - Daily BMP, replace electrolytes PRN - Avoid nephrotoxic agents as able, ensure adequate renal perfusion - sodium bicarb, insulin  and D50 ordered - f/u VBG and AM labs  Suspected Sepsis vs Reactive Lactic and leukocytosis due to GIB - Supplemental oxygen as needed, to maintain SpO2 > 90% - hold off on additional cultures at this time, trend lactic/ PCT - Daily CBC, monitor WBC/ fever curve - will start zosyn , consider de-escalating if PCT is negative - IVF hydration as needed  Vascular Dementia with behavior disturbance Dysphagia Depression Moderate Malnutrition Overall poor prognosis, family aware of current status. - NPO currently due to GIB, consider restarting mirtazapine  and dietary supplements once able to tolerate PO meds - SLP eval once stabilized  Best Practice (right click and "Reselect all SmartList Selections" daily)  Diet/type: NPO DVT prophylaxis SCD Pressure ulcer(s): N/A GI prophylaxis: PPI Lines: Central line and yes and it is still needed Foley:  N/A Code Status:  DNR Last date of multidisciplinary goals of care discussion [06/11/23]  Labs   CBC: Recent Labs  Lab 06/06/23 1602 06/07/23 0417 06/11/23 0418 06/11/23 0841 06/11/23 1353  WBC 19.3* 12.7* 16.7*  --   --   NEUTROABS 17.3*  --   --   --   --   HGB 8.5* 8.5* 4.3* 4.9* 8.1*  HCT 25.8* 24.3* 12.7* 14.8*  --   MCV 99.6 96.0 95.5  --   --   PLT 315 322 316  --   --     Basic Metabolic Panel: Recent Labs  Lab 06/06/23 1602 06/07/23 0417 06/11/23 0418  NA 138 139 143  K 3.8 3.8 4.2  CL 107 106 112*  CO2 23 25 23   GLUCOSE 109* 113* 175*  BUN 21 22 53*  CREATININE 0.69 0.68 0.78  CALCIUM  8.1* 8.5* 8.1*  MG  --   --  2.2   GFR: Estimated Creatinine Clearance: 35.4 mL/min (by C-G formula based on SCr of 0.78 mg/dL). Recent Labs  Lab 06/06/23 1602 06/06/23 2008 06/06/23 2224  06/07/23 0417 06/11/23 0418  WBC 19.3*  --   --  12.7* 16.7*  LATICACIDVEN  --  1.1 1.0  --   --     Liver Function Tests: Recent Labs  Lab 06/06/23 1602  AST 28  ALT 23  ALKPHOS 69  BILITOT 1.0  PROT 5.9*  ALBUMIN  2.8*   No results for input(s): "LIPASE", "AMYLASE" in the last 168 hours. No results for input(s): "AMMONIA" in the last 168 hours.  ABG No results found for: "PHART", "PCO2ART", "PO2ART", "HCO3", "TCO2", "ACIDBASEDEF", "O2SAT"   Coagulation Profile: No results for input(s): "INR", "PROTIME" in the last 168 hours.  Cardiac Enzymes: No results for input(s): "CKTOTAL", "CKMB", "CKMBINDEX", "TROPONINI" in the last 168 hours.  HbA1C: Hgb A1c MFr Bld  Date/Time Value Ref Range Status  06/22/2022 01:47 PM 5.2 4.8 - 5.6 % Final    Comment:    (NOTE) Pre diabetes:          5.7%-6.4%  Diabetes:              >6.4%  Glycemic control for   <7.0% adults with diabetes     CBG: Recent Labs  Lab 06/11/23 2253  GLUCAP 163*  Review of Systems:   UTA  Past Medical History:  She,  has a past medical history of Arthritis, Chest pain, GERD (gastroesophageal reflux disease), Heart murmur, History of blood transfusion, History of migraine headaches, Iron deficiency anemia, Joint pain, PONV (postoperative nausea and vomiting), and Rectocele (2011).   Surgical History:   Past Surgical History:  Procedure Laterality Date   cataract surgery  Bilateral    R 03/2001; L 05/2001.   COLONOSCOPY     HIP ARTHROPLASTY Left 06/07/2023   Procedure: HEMIARTHROPLASTY (BIPOLAR) HIP, POSTERIOR APPROACH FOR FRACTURE;  Surgeon: Lorri Rota, MD;  Location: [facility] ORS;  Service: Orthopedics;  Laterality: Left;   HYSTEROSCOPY  11/1999   w/ resection of endometrial polyp by uterine curetting.   INTRAMEDULLARY (IM) NAIL INTERTROCHANTERIC Right 07/15/2020   Procedure: INTRAMEDULLARY (IM) NAIL INTERTROCHANTRIC;  Surgeon: Rande Bushy, MD;  Location: [facility] ORS;  Service: Orthopedics;   Laterality: Right;   JOINT REPLACEMENT Bilateral R-9/04, L- 8/05   Total Knee Arthroplasties.   KYPHOPLASTY N/A 04/10/2019   Procedure: KYPHOPLASTY;  Surgeon: Molli Angelucci, MD;  Location: [facility] ORS;  Service: Orthopedics;  Laterality: N/A;   REVERSE SHOULDER ARTHROPLASTY Right 11/26/2013   Procedure: RIGHT REVERSE SHOULDER ARTHROPLASTY;  Surgeon: Glo Larch, MD;  Location: [facility] OR;  Service: Orthopedics;  Laterality: Right;   SHOULDER ARTHROSCOPY Right    THYROIDECTOMY  at age 79   TONSILLECTOMY       Social History:   reports that she has never smoked. She has never used smokeless tobacco. She reports that she does not drink alcohol  and does not use drugs.   Family History:  Her family history includes Heart attack in her brother, brother, and mother; Other in her father.   Allergies Allergies  Allergen Reactions   Sulfonamide Derivatives Hives   Sulfa Antibiotics Other (See Comments) and Rash     Home Medications  Prior to Admission medications   Medication Sig Start Date End Date Taking? Authorizing Provider  acetaminophen  (TYLENOL ) 500 MG tablet Take 1,000 mg by mouth 3 (three) times daily.   Yes [provider]  aspirin  EC 81 MG tablet Take 81 mg by mouth daily. Swallow whole.   Yes [provider]  atorvastatin  (LIPITOR) 40 MG tablet Take 1 tablet (40 mg total) by mouth daily. 06/26/22  Yes Luna Salinas, MD  bismuth  subsalicylate (PEPTO BISMOL) 262 MG/15ML suspension Take 30 mLs by mouth every 4 (four) hours as needed.   Yes [provider]  calcium  carbonate (OSCAL) 1500 (600 Ca) MG TABS tablet Take 600 mg of elemental calcium  by mouth daily.   Yes [provider]  cholecalciferol  (VITAMIN D ) 1000 units tablet Take 1,000 Units by mouth daily.   Yes [provider]  mirtazapine  (REMERON ) 7.5 MG tablet Take 7.5 mg by mouth at bedtime.   Yes [provider]  polyethylene glycol (MIRALAX  / GLYCOLAX ) 17 g packet Take 17 g by  mouth daily.   Yes [provider]  saccharomyces boulardii (FLORASTOR) 250 MG capsule Take 250 mg by mouth daily.   Yes [provider]  enoxaparin  (LOVENOX ) 40 MG/0.4ML injection Inject 0.4 mLs (40 mg total) into the skin daily for 28 days. 06/10/23 07/08/23  Bert Britain, PA-C  mirabegron  ER (MYRBETRIQ ) 25 MG TB24 tablet Take 25 mg by mouth daily. Patient not taking: Reported on 05/21/2023    [provider]  oxyCODONE  (OXY IR/ROXICODONE ) 5 MG immediate release tablet Take 0.5-1 tablets (2.5-5 mg total) by mouth every 4 (  four) hours as needed for moderate pain (pain score 4-6) (pain score 4-6). 06/10/23   Bert Britain, PA-C  traMADol  (ULTRAM ) 50 MG tablet Take 1 tablet (50 mg total) by mouth every 6 (six) hours as needed for moderate pain (pain score 4-6). 06/10/23   Bert Britain, PA-C  UNABLE TO FIND Take 4 oz by mouth 2 (two) times daily. Med Name: Medpass    [provider]     Critical care time: 75 minutes    Eliott Guess, AGACNP-BC Acute Care Nurse Practitioner [facility]   [phone] / [phone] Please see Amion for details.

## 2023-06-11 NOTE — Progress Notes (Addendum)
 Pt's hgb reported at 4.3 before day 7am.  Reported to day nurse.  BP 70/30s early this morning.  Pt presented with bloody bowel incontinent bowel movement around 730am.  Honeycomb dressing to left hip dry and intact.  Charge and MD notified.  Orders executed.   Pt too lethargic for medicine administration and breakfast.  Spouse at [facility] now.  VS stable at this time.  Second unit of blood running in PIV. Will continue to monitor.

## 2023-06-11 NOTE — Plan of Care (Signed)
  Problem: Education: Goal: Knowledge of General Education information will improve Description: Including pain rating scale, medication(s)/side effects and non-pharmacologic comfort measures Outcome: Not Progressing   Problem: Clinical Measurements: Goal: Ability to maintain clinical measurements within normal limits will improve Outcome: Not Progressing   Critical hemoglobin level of 4.3, Ouma NP notified via phone. Patient has orders to receive 2 units of blood. Contacted husband for consent.

## 2023-06-11 NOTE — Progress Notes (Signed)
 Subjective: 4 Days Post-Op Procedure(s) (LRB): HEMIARTHROPLASTY (BIPOLAR) HIP, POSTERIOR APPROACH FOR FRACTURE (Left) Patient is comfortably resting.  Answering questions slightly.  More alert today. We will continue therapy today.  Black stool last night.  History of anemia. Plan is to go Skilled nursing facility after hospital stay.  Objective: Vital signs in last 24 hours: Temp:  [97.1 F (36.2 C)-97.8 F (36.6 C)] 97.1 F (36.2 C) (04/22 0520) Pulse Rate:  [91-101] 101 (04/22 0520) Resp:  [16-20] 20 (04/22 0520) BP: (83-121)/(52-103) 83/52 (04/22 0520) SpO2:  [90 %-100 %] 100 % (04/22 0520)  Intake/Output from previous day: No intake or output data in the 24 hours ending 06/11/23 0719   Intake/Output this shift: No intake/output data recorded.  Labs: Recent Labs    06/11/23 0418  HGB 4.3*    Recent Labs    06/11/23 0418  WBC 16.7*  RBC 1.33*  HCT 12.7*  PLT 316    Recent Labs    06/11/23 0418  NA 143  K 4.2  CL 112*  CO2 23  BUN 53*  CREATININE 0.78  GLUCOSE 175*  CALCIUM  8.1*    No results for input(s): "LABPT", "INR" in the last 72 hours.  EXAM General - Patient is relatively lethargic, difficulty opening eyes during visit, able to mumble answers to provider questions Extremity - Neurologically intact Neurovascular intact Sensation intact distally Intact pulses distally No cellulitis present Compartment soft Able to wiggle toes Dressing - dressing C/D/I and no drainage.  No active bleeding.  No hematoma.  Mild ecchymosis. Motor Function - intact, able to slightly move toes  Past Medical History:  Diagnosis Date   Arthritis    Chest pain    a. 05/2015 - post-prandial.   GERD (gastroesophageal reflux disease)    a. takes Omeprazole  daily, prev seen by D. Brodie.   Heart murmur    a. 05/2013 Echo: EF 55-60%, gr2 DD, mild AI/MR.   History of blood transfusion    no abnormal reaction noted    History of migraine headaches    a. last 10/2013    Iron deficiency anemia    Joint pain    PONV (postoperative nausea and vomiting)    Rectocele 2011    Assessment/Plan: 4 Days Post-Op Procedure(s) (LRB): HEMIARTHROPLASTY (BIPOLAR) HIP, POSTERIOR APPROACH FOR FRACTURE (Left) Principal Problem:   Closed left femoral fracture (HCC) Active Problems:   GERD   Constipation   OP (osteoporosis)   Depression   Protein-calorie malnutrition, severe (HCC)   Vascular dementia with behavior disturbance (HCC)   Mixed hyperlipidemia   Malnutrition of moderate degree   Closed left hip fracture, initial encounter (HCC)  Estimated body mass index is 18.34 kg/m as calculated from the following:   Height as of this encounter: 5\' 5"  (1.651 m).   Weight as of this encounter: 50 kg.  Patient will continue to work with physical therapy Hip Preacutions and in knee immobilizer Honeycomb bandage intact and clean VSS Labs show most recent hemoglobin of 4.3.  2 units packed red blood cells ordered. DC planning to SNF  Weight-Bearing as tolerated to left leg Stay in knee immobilizer DVT Prophylaxis: Lovenox  and SCDs  Patient will follow-up with [facility] orthopedics in 2 weeks for re-imaging and reevaluation   Bert Britain, PA-C [facility] 06/11/2023, 7:19 AM

## 2023-06-11 NOTE — Progress Notes (Addendum)
  PROGRESS NOTE    Melissa [name]  ZOX:[id number] DOB: 30-Jun-1928 DOA: 06/06/2023 PCP: Valrie Gehrig, MD  146A/146A-AA  LOS: 5 days   Brief hospital course:   Assessment & Plan: Melissa [name] is a 89 year old female with history of dementia, iron deficiency anemia, depression, GERD, history of chronic compression fracture, who presents emergency department for chief concerns of a witnessed fall at facility. ([facility])  She is Long term at [facility] bound   X-ray bilateral hip with pelvis Minimally displaced transcervical fracture of the left proximal femur.   Closed left femoral fracture (HCC) post mechanical fall S/p left HEMIARTHROPLASTY on 06/07/23 --Hip Preacutions and in knee immobilizer  Weight-Bearing as tolerated to left leg Stay in knee immobilizer DVT Prophylaxis: Lovenox  follow-up with [facility] clinic orthopedics in 2 weeks for re-imaging and reevaluation   Lower GI bleed --developed morning of 4/22, likely diverticular.  Per husband, pt already had extensive GI workup before and not wanting more scope. --monitor for bloody BM's.  Acute on chronic anemia 2/2 blood loss --Hgb 8.5 on presentation, dropped down to 4.3 this morning, likely due to blood loss from both surgery and acute lower GI bleed. --2u pRBC today --monitor Hgb and transfuse to keep Hgb >7  Hypotension --associated with Hgb drop and GI bleed. --s/p 1L NS --cont MIVF --husband wants pt to receive pressors if needed  Depression --mirtazapine     Mixed hyperlipidemia --cont statin   Vascular dementia with behavior disturbance (HCC) --Appears to be at patient's baseline --poor long term prognosis   Underweight Mod malnutrition --supplements per dietician    Osteoporosis -cont Calcium , vitamin D  supplementation    Constipation --cont bowel regimen  Dysphagia  --has baseline dysphagia with difficulty with solid foods, now with poor mental status and not safe to sallow --NPO for now.    --SLP eval when pt is more alert.   DVT prophylaxis: Lovenox  SQ Code Status: DNR No intubation, yes to pressors, per husband. Family Communication: husband updated at bedside today Level of care: Med-Surg Dispo:   The patient is from: SNF LTC Anticipated d/c is to: SNF LTC Anticipated d/c date is: 2-3 days   Subjective and Interval History:  Per nursing, pt had large bloody BM around 1 am, and another smaller bloody BM later morning.  Pt remained lethargic.   Objective: Vitals:   06/11/23 0957 06/11/23 1031 06/11/23 1201 06/11/23 1513  BP: (!) 109/59 118/66 (!) 129/57 (!) 102/45  Pulse: (!) 104 (!) 104 (!) 101 (!) 108  Resp: 20 16 18    Temp: 98 F (36.7 C) 98 F (36.7 C) 97.8 F (36.6 C)   TempSrc: Axillary Axillary Axillary   SpO2: 100% 100% 100% 100%  Weight:      Height:        Intake/Output Summary (Last 24 hours) at 06/11/2023 1553 Last data filed at 06/11/2023 1201 Gross per 24 hour  Intake 484.58 ml  Output --  Net 484.58 ml   Filed Weights   06/06/23 1107  Weight: 50 kg    Examination:   Constitutional: NAD, lethargic CV: No cyanosis.   RESP: normal respiratory effort, on RA SKIN: warm, dry   Data Reviewed: I have personally reviewed labs and imaging studies  Time spent: 50 minutes  Garrison Kanner, MD [facility] If 7PM-7AM, please contact night-coverage 06/11/2023, 3:53 PM

## 2023-06-11 NOTE — Progress Notes (Signed)
   06/11/23 0520  Assess: MEWS Score  Temp (!) 97.1 F (36.2 C)  BP (!) 83/52  MAP (mmHg) (!) 63  Pulse Rate (!) 101  Resp 20  SpO2 100 %  O2 Device Nasal Cannula  Assess: MEWS Score  MEWS Temp 0  MEWS Systolic 1  MEWS Pulse 1  MEWS RR 0  MEWS LOC 0  MEWS Score 2  MEWS Score Color Yellow  Assess: if the MEWS score is Yellow or Red  Were vital signs accurate and taken at a resting state? Yes  Does the patient meet 2 or more of the SIRS criteria? No  MEWS guidelines implemented  Yes, yellow  Treat  MEWS Interventions Considered administering scheduled or prn medications/treatments as ordered  Take Vital Signs  Increase Vital Sign Frequency  Yellow: Q2hr x1, continue Q4hrs until patient remains green for 12hrs  Escalate  MEWS: Escalate Yellow: Discuss with charge nurse and consider notifying provider and/or RRT  Notify: Charge Nurse/RN  Name of Charge Nurse/RN Notified [place]  Provider Notification  Provider Name/Title Ouma NP  Date Provider Notified 06/11/23  Time Provider Notified [phone]  Method of Notification Call  Notification Reason Change in status;Critical Result  Provider response See new orders  Date of Provider Response 06/11/23  Time of Provider Response [id number]  Assess: SIRS CRITERIA  SIRS Temperature  0  SIRS Respirations  0  SIRS Pulse 1  SIRS WBC 0  SIRS Score Sum  1   Patient will receive blood and a normal saline bolus.

## 2023-06-11 NOTE — Plan of Care (Signed)
  Problem: Clinical Measurements: Goal: Ability to maintain clinical measurements within normal limits will improve Outcome: Progressing Goal: Will remain free from infection Outcome: Progressing Goal: Respiratory complications will improve Outcome: Progressing   Problem: Coping: Goal: Level of anxiety will decrease Outcome: Progressing   Problem: Pain Managment: Goal: General experience of comfort will improve and/or be controlled Outcome: Progressing

## 2023-06-11 NOTE — IPAL (Addendum)
 Interdisciplinary Goals of Care Family Meeting    Patient Name: Melissa [name]   MRN: [id number]   Date of Birth/ Sex: Jul 06, 1928 , female      Admission Date: 06/06/2023  Attending Provider: Garrison Kanner, MD  Primary Diagnosis: Closed left femoral fracture [facility])    Date carried out: 06/11/23 Location of the meeting: Telephone conference   Member's involved: NP and Family Member or next of kin   Durable Power of Attorney or Environmental health practitioner: Husband   Discussion:  Advance Care Planning/Goals of Care discussion was performed during the course of treatment to decide on type of care right for this patient following change in clinical status.   I had a three way phone discussion with patient's son Polly Brink, Husband and daughter to discuss goals of care in details following  change in patient's clinical status. Reviewed patient's worsening labs (drop in hemoglobin), vital signs including unstable HR and blood pressure that will require pressors. Difficult IV access and need for central line placement. Patient also with active GI bleed and will need blood transfusion. We discussed overall prognosis with the family and answered all their question.   Discussed prognosis, expected outcome with or without ongoing aggressive treatments and the options for de-escalation of care.   Diagnosis(es): Acute Blood Loss Anemia  with hemorrhagic shock in the setting of GI Bleed.  Prognosis: Guarded Code Status: DNR Miscellaneous Information:  - Imaging and Labs sent, including: CBC, BMP, LACTATE, PRBC 1 UNIT, CTA GI Bleed  - Primary team notified?  Yes  - Additional notes/ transfer status: Transfer to ICU, PCCM consulted   Next Steps:  Family understands the situation. They confirm DNR/DNI status but would wish to pursue aggressive treatment and interventions including central line placement and art line for hemodynamics management and monitoring. Family are satisfied with Plan of action and  management. All questions answered    Total Time Spent Face to Face addressing advance care planning in the presence of the Patient: 35 minutes       Alonza Arthurs, DNP, CCRN, FNP-C, AGACNP-BC Acute Care & Family Nurse Practitioner  [facility]  See Amion for personal pager PCCM on call pager [phone] until 7 am

## 2023-06-11 NOTE — Procedures (Signed)
 Central Venous Catheter Insertion Procedure Note  ALLYCIA PITZ  [id number]  [birth year]  Date:06/11/23  Time:11:36 PM   Provider Performing:Tacey Dimaggio L [place]   Procedure: Insertion of Non-tunneled Central Venous [phone]) with US  guidance ([id number])   Indication(s) Medication administration and Difficult access  Consent Unable to obtain consent due to emergent nature of procedure. TRH NP spoke with family.  Anesthesia Topical only with 1% lidocaine    Timeout Verified patient identification, verified procedure, site/side was marked, verified correct patient position, special equipment/implants available, medications/allergies/relevant history reviewed, required imaging and test results available.  Sterile Technique Maximal sterile technique including full sterile barrier drape, hand hygiene, sterile gown, sterile gloves, mask, hair covering, sterile ultrasound probe cover (if used).  Procedure Description Area of catheter insertion was cleaned with chlorhexidine  and draped in sterile fashion.  With real-time ultrasound guidance a central venous catheter was placed into the right femoral vein. Nonpulsatile blood flow and easy flushing noted in all ports.  The catheter was sutured in place and sterile dressing applied.  Complications/Tolerance None; patient tolerated the procedure well. Chest X-ray is ordered to verify placement for internal jugular or subclavian cannulation.   Chest x-ray is not ordered for femoral cannulation.  EBL Minimal  Specimen(s) None  Eliott Guess, AGACNP-BC Acute Care Nurse Practitioner [facility]   [phone] / [phone] Please see Amion for details.

## 2023-06-12 ENCOUNTER — Inpatient Hospital Stay

## 2023-06-12 DIAGNOSIS — Z515 Encounter for palliative care: Secondary | ICD-10-CM

## 2023-06-12 DIAGNOSIS — S72002A Fracture of unspecified part of neck of left femur, initial encounter for closed fracture: Secondary | ICD-10-CM | POA: Diagnosis not present

## 2023-06-12 DIAGNOSIS — R578 Other shock: Secondary | ICD-10-CM

## 2023-06-12 DIAGNOSIS — N179 Acute kidney failure, unspecified: Secondary | ICD-10-CM | POA: Insufficient documentation

## 2023-06-12 DIAGNOSIS — D649 Anemia, unspecified: Secondary | ICD-10-CM | POA: Insufficient documentation

## 2023-06-12 DIAGNOSIS — E875 Hyperkalemia: Secondary | ICD-10-CM | POA: Insufficient documentation

## 2023-06-12 DIAGNOSIS — F01518 Vascular dementia, unspecified severity, with other behavioral disturbance: Secondary | ICD-10-CM | POA: Diagnosis not present

## 2023-06-12 DIAGNOSIS — K922 Gastrointestinal hemorrhage, unspecified: Secondary | ICD-10-CM | POA: Insufficient documentation

## 2023-06-12 LAB — BASIC METABOLIC PANEL WITH GFR
Anion gap: 12 (ref 5–15)
Anion gap: 17 — ABNORMAL HIGH (ref 5–15)
Anion gap: 9 (ref 5–15)
BUN: 72 mg/dL — ABNORMAL HIGH (ref 8–23)
BUN: 81 mg/dL — ABNORMAL HIGH (ref 8–23)
BUN: 81 mg/dL — ABNORMAL HIGH (ref 8–23)
CO2: 12 mmol/L — ABNORMAL LOW (ref 22–32)
CO2: 22 mmol/L (ref 22–32)
CO2: 24 mmol/L (ref 22–32)
Calcium: 7.3 mg/dL — ABNORMAL LOW (ref 8.9–10.3)
Calcium: 7.4 mg/dL — ABNORMAL LOW (ref 8.9–10.3)
Calcium: 7.6 mg/dL — ABNORMAL LOW (ref 8.9–10.3)
Chloride: 116 mmol/L — ABNORMAL HIGH (ref 98–111)
Chloride: 117 mmol/L — ABNORMAL HIGH (ref 98–111)
Chloride: 119 mmol/L — ABNORMAL HIGH (ref 98–111)
Creatinine, Ser: 1.46 mg/dL — ABNORMAL HIGH (ref 0.44–1.00)
Creatinine, Ser: 1.54 mg/dL — ABNORMAL HIGH (ref 0.44–1.00)
Creatinine, Ser: 1.66 mg/dL — ABNORMAL HIGH (ref 0.44–1.00)
GFR, Estimated: 29 mL/min — ABNORMAL LOW (ref >60)
GFR, Estimated: 31 mL/min — ABNORMAL LOW (ref >60)
GFR, Estimated: 34 mL/min — ABNORMAL LOW (ref >60)
Glucose, Bld: 177 mg/dL — ABNORMAL HIGH (ref 70–99)
Glucose, Bld: 259 mg/dL — ABNORMAL HIGH (ref 70–99)
Glucose, Bld: 267 mg/dL — ABNORMAL HIGH (ref 70–99)
Potassium: 4.1 mmol/L (ref 3.5–5.1)
Potassium: 4.2 mmol/L (ref 3.5–5.1)
Potassium: 5.6 mmol/L — ABNORMAL HIGH (ref 3.5–5.1)
Sodium: 148 mmol/L — ABNORMAL HIGH (ref 135–145)
Sodium: 150 mmol/L — ABNORMAL HIGH (ref 135–145)
Sodium: 150 mmol/L — ABNORMAL HIGH (ref 135–145)

## 2023-06-12 LAB — CBC
HCT: 20.6 % — ABNORMAL LOW (ref 36.0–46.0)
HCT: 23.9 % — ABNORMAL LOW (ref 36.0–46.0)
Hemoglobin: 7 g/dL — ABNORMAL LOW (ref 12.0–15.0)
Hemoglobin: 8.3 g/dL — ABNORMAL LOW (ref 12.0–15.0)
MCH: 30.7 pg (ref 26.0–34.0)
MCH: 30.7 pg (ref 26.0–34.0)
MCHC: 34 g/dL (ref 30.0–36.0)
MCHC: 34.7 g/dL (ref 30.0–36.0)
MCV: 88.5 fL (ref 80.0–100.0)
MCV: 90.4 fL (ref 80.0–100.0)
Platelets: 243 10*3/uL (ref 150–400)
Platelets: 298 10*3/uL (ref 150–400)
RBC: 2.28 MIL/uL — ABNORMAL LOW (ref 3.87–5.11)
RBC: 2.7 MIL/uL — ABNORMAL LOW (ref 3.87–5.11)
RDW: 17.8 % — ABNORMAL HIGH (ref 11.5–15.5)
RDW: 20.4 % — ABNORMAL HIGH (ref 11.5–15.5)
WBC: 25.5 10*3/uL — ABNORMAL HIGH (ref 4.0–10.5)
WBC: 40.4 10*3/uL — ABNORMAL HIGH (ref 4.0–10.5)
nRBC: 3 % — ABNORMAL HIGH (ref 0.0–0.2)
nRBC: 3.7 % — ABNORMAL HIGH (ref 0.0–0.2)

## 2023-06-12 LAB — PHOSPHORUS: Phosphorus: 3.9 mg/dL (ref 2.5–4.6)

## 2023-06-12 LAB — BLOOD GAS, VENOUS
Acid-base deficit: 2.2 mmol/L — ABNORMAL HIGH (ref 0.0–2.0)
Bicarbonate: 23.2 mmol/L (ref 20.0–28.0)
O2 Saturation: 72.8 %
Patient temperature: 37
pCO2, Ven: 41 mmHg — ABNORMAL LOW (ref 44–60)
pH, Ven: 7.36 (ref 7.25–7.43)
pO2, Ven: 43 mmHg (ref 32–45)

## 2023-06-12 LAB — HEMOGLOBIN AND HEMATOCRIT, BLOOD
HCT: 25.4 % — ABNORMAL LOW (ref 36.0–46.0)
Hemoglobin: 9.2 g/dL — ABNORMAL LOW (ref 12.0–15.0)

## 2023-06-12 LAB — MAGNESIUM: Magnesium: 2 mg/dL (ref 1.7–2.4)

## 2023-06-12 LAB — GLUCOSE, CAPILLARY
Glucose-Capillary: 242 mg/dL — ABNORMAL HIGH (ref 70–99)
Glucose-Capillary: 220 mg/dL — ABNORMAL HIGH (ref 70–99)
Glucose-Capillary: 216 mg/dL — ABNORMAL HIGH (ref 70–99)
Glucose-Capillary: 236 mg/dL — ABNORMAL HIGH (ref 70–99)

## 2023-06-12 LAB — HEPATIC FUNCTION PANEL
ALT: 551 U/L — ABNORMAL HIGH (ref 0–44)
AST: 863 U/L — ABNORMAL HIGH (ref 15–41)
Albumin: 2.3 g/dL — ABNORMAL LOW (ref 3.5–5.0)
Alkaline Phosphatase: 67 U/L (ref 38–126)
Bilirubin, Direct: 0.4 mg/dL — ABNORMAL HIGH (ref 0.0–0.2)
Indirect Bilirubin: 0.6 mg/dL (ref 0.3–0.9)
Total Bilirubin: 1 mg/dL (ref 0.0–1.2)
Total Protein: 4.5 g/dL — ABNORMAL LOW (ref 6.5–8.1)

## 2023-06-12 LAB — PROCALCITONIN: Procalcitonin: 2.5 ng/mL

## 2023-06-12 LAB — LACTIC ACID, PLASMA
Lactic Acid, Venous: 2.8 mmol/L (ref 0.5–1.9)
Lactic Acid, Venous: 5.3 mmol/L (ref 0.5–1.9)
Lactic Acid, Venous: >9 mmol/L (ref 0.5–1.9)

## 2023-06-12 LAB — PREPARE RBC (CROSSMATCH)

## 2023-06-12 LAB — MRSA NEXT GEN BY PCR, NASAL: MRSA by PCR Next Gen: NOT DETECTED

## 2023-06-12 MED ORDER — MORPHINE BOLUS VIA INFUSION: 5.0000 mg | INTRAVENOUS | Status: DC | PRN

## 2023-06-12 MED ORDER — GLYCOPYRROLATE 0.2 MG/ML IJ SOLN: 0.2000 mg | INTRAMUSCULAR | Status: DC | PRN

## 2023-06-12 MED ORDER — GLYCOPYRROLATE 1 MG PO TABS: 1.0000 mg | ORAL_TABLET | ORAL | Status: DC | PRN

## 2023-06-12 MED ORDER — HYDROCORTISONE SOD SUC (PF) 100 MG IJ SOLR
50.0000 mg | Freq: Four times a day (QID) | INTRAMUSCULAR | Status: DC
  Administered 2023-06-12 (×2): 50 mg via INTRAVENOUS
  Filled 2023-06-12 (×2): qty 2

## 2023-06-12 MED ORDER — LORAZEPAM 2 MG/ML IJ SOLN: 2.0000 mg | INTRAMUSCULAR | Status: DC | PRN

## 2023-06-12 MED ORDER — ACETAMINOPHEN 325 MG PO TABS: 650.0000 mg | ORAL_TABLET | Freq: Four times a day (QID) | ORAL | Status: DC | PRN

## 2023-06-12 MED ORDER — SODIUM CHLORIDE 0.9% IV SOLUTION: Freq: Once | INTRAVENOUS | Status: AC

## 2023-06-12 MED ORDER — SODIUM BICARBONATE 8.4 % IV SOLN
100.0000 meq | Freq: Once | INTRAVENOUS | Status: AC
  Administered 2023-06-12: 100 meq via INTRAVENOUS
  Filled 2023-06-12: qty 50

## 2023-06-12 MED ORDER — SODIUM CHLORIDE 0.9% IV SOLUTION: Freq: Once | INTRAVENOUS | Status: DC

## 2023-06-12 MED ORDER — HYDROMORPHONE HCL 1 MG/ML IJ SOLN
INTRAMUSCULAR | Status: AC
  Filled 2023-06-12: qty 1

## 2023-06-12 MED ORDER — INSULIN ASPART 100 UNIT/ML IJ SOLN
5.0000 [IU] | Freq: Once | INTRAMUSCULAR | Status: AC
  Administered 2023-06-12: 5 [IU] via SUBCUTANEOUS
  Filled 2023-06-12: qty 1

## 2023-06-12 MED ORDER — DEXTROSE 50 % IV SOLN
1.0000 | Freq: Once | INTRAVENOUS | Status: AC
  Administered 2023-06-12: 50 mL via INTRAVENOUS
  Filled 2023-06-12: qty 50

## 2023-06-12 MED ORDER — PANTOPRAZOLE SODIUM 40 MG IV SOLR
40.0000 mg | Freq: Two times a day (BID) | INTRAVENOUS | Status: DC
  Administered 2023-06-12 (×2): 40 mg via INTRAVENOUS
  Filled 2023-06-12 (×2): qty 10

## 2023-06-12 MED ORDER — HYDROMORPHONE HCL 1 MG/ML IJ SOLN
1.0000 mg | INTRAMUSCULAR | Status: DC | PRN
  Administered 2023-06-12: 1 mg via INTRAVENOUS

## 2023-06-12 MED ORDER — MORPHINE 100MG IN NS 100ML (1MG/ML) PREMIX INFUSION
0.0000 mg/h | INTRAVENOUS | Status: DC
  Administered 2023-06-12: 5 mg/h via INTRAVENOUS
  Filled 2023-06-12: qty 100

## 2023-06-12 MED ORDER — ORAL CARE MOUTH RINSE
15.0000 mL | OROMUCOSAL | Status: DC
  Administered 2023-06-12 (×3): 15 mL via OROMUCOSAL

## 2023-06-12 MED ORDER — ACETAMINOPHEN 650 MG RE SUPP: 650.0000 mg | Freq: Four times a day (QID) | RECTAL | Status: DC | PRN

## 2023-06-12 MED ORDER — POLYVINYL ALCOHOL 1.4 % OP SOLN: 1.0000 [drp] | Freq: Four times a day (QID) | OPHTHALMIC | Status: DC | PRN

## 2023-06-12 MED ORDER — INSULIN ASPART 100 UNIT/ML IJ SOLN
0.0000 [IU] | INTRAMUSCULAR | Status: DC
  Administered 2023-06-12: 3 [IU] via SUBCUTANEOUS
  Filled 2023-06-12: qty 1

## 2023-06-12 MED ORDER — ORAL CARE MOUTH RINSE: 15.0000 mL | OROMUCOSAL | Status: DC | PRN

## 2023-06-12 MED ORDER — PIPERACILLIN-TAZOBACTAM 3.375 G IVPB
3.3750 g | Freq: Two times a day (BID) | INTRAVENOUS | Status: DC
  Administered 2023-06-12 (×2): 3.375 g via INTRAVENOUS
  Filled 2023-06-12 (×2): qty 50

## 2023-06-12 MED ORDER — HYDROMORPHONE HCL-NACL 50-0.9 MG/50ML-% IV SOLN: 1.0000 mg/h | INTRAVENOUS | Status: DC

## 2023-06-12 MED ORDER — VASOPRESSIN 20 UNITS/100 ML INFUSION FOR SHOCK
0.0000 [IU]/min | INTRAVENOUS | Status: DC
  Administered 2023-06-12 (×2): 0.04 [IU]/min via INTRAVENOUS
  Filled 2023-06-12 (×3): qty 100

## 2023-06-12 MED ORDER — SODIUM CHLORIDE 0.45 % IV SOLN: INTRAVENOUS | Status: DC

## 2023-06-13 LAB — BPAM FFP
Blood Product Expiration Date: 202504252359
ISSUE DATE / TIME: 202504230848
Unit Type and Rh: 6200

## 2023-06-13 LAB — PREPARE FRESH FROZEN PLASMA: Unit division: 0

## 2023-06-15 LAB — BPAM RBC
Blood Product Expiration Date: 202504272359
Blood Product Expiration Date: 202505012359
Blood Product Expiration Date: 202505162359
Blood Product Expiration Date: 202505182359
Blood Product Expiration Date: 202505242359
ISSUE DATE / TIME: 202504220746
ISSUE DATE / TIME: 202504221012
ISSUE DATE / TIME: 202504230008
ISSUE DATE / TIME: 202504230620
Unit Type and Rh: 5100
Unit Type and Rh: 5100
Unit Type and Rh: 5100
Unit Type and Rh: 5100
Unit Type and Rh: 5100

## 2023-06-15 LAB — TYPE AND SCREEN
ABO/RH(D): O POS
Antibody Screen: NEGATIVE
Unit division: 0
Unit division: 0
Unit division: 0
Unit division: 0
Unit division: 0

## 2023-06-20 NOTE — Consult Note (Signed)
 Consultation Note Date: 06/22/23 at 1030  Patient Name: Melissa [name]  DOB: [birth year]  MRN: [id number]  Age / Sex: 89 y.o., female  PCP: Valrie Gehrig, MD Referring Physician: Erskin Hearing, MD  HPI/Patient Profile: 89 y.o. female  with past medical history of dementia, iron deficiency anemia, depression, GERD, HFpEF, and CVA admitted on 06/06/2023 with injury sustained from a mechanical fall.  4/18, patient had left hemiarthroplasty for left femoral fracture.  4/22, hemoglobin dropped to 4.3 and patient received packed RBCs.  As per husband, no GI workup is preferred.    Evening of 4/22, rapid response called for hypotension and poor IV access. Patient was transferred to ICU for central line placement and pressor support.  PMT was consulted to support patient and family with goals of care discussions..   Clinical Assessment and Goals of Care: Extensive chart review completed prior to meeting patient including labs, vital signs, imaging, progress notes, orders, and available advanced directive documents from current and previous encounters. I then met with patient and her husband Melissa [name] at bedside to discuss diagnosis prognosis, GOC, EOL wishes, disposition and options.  I introduced Palliative Medicine as specialized medical care for people living with serious illness. It focuses on providing relief from the symptoms and stress of a serious illness. The goal is to improve quality of life for both the patient and the family.  Patient is encephalopathic and unable to participate in goals of care medical decision making independently at this time.  Entire discussion was held with patient's husband, NP Maryagnes Small, RN Kayleen Party, and myself.  We discussed a brief life review of the patient.  Autry Legions shares that he and [name] have been married for almost 75 years (August 2025).  They have been living for the last 18 years  at [facility].  He speaks fondly of how much he loves her and that she has been happy in their home at [facility].  NP Maryagnes Small and I discussed patient's current illness - hypotension, shock liver, GIB/anemia, kidney injury-  and what it means in the larger context of patient's on-going co-morbidities - advanced age, dementia, recent fall/surgery.  Education provided on use of RBCs and vasopressors to sustain blood pressure and blood volume.  Patient's husband shares he understands that she is not well, that she is not going to get better, and that it is "her time".  Natural disease trajectory and expectations at EOL were discussed.  We attempted to elicit values and goals of care important to the patient.  Melissa [name] shares that he has been administer and present for countless numbers of discussions at bedside just as we are having today.  He shares that it is now his turn to step into the role of being the receiver of this news.  He shares that he is prepared.  He does not want his wife to suffer.  He asks that current measures to be continued until his 2 children can be present.  They are flying in from [place]  and should be at the  hospital about 6:00 tonight.  Reviewed the measures will continue to keep patient comfortable, including blood draws and administration of vasopressors and RBCs if needed.  However, if patient begins to show signs of decline or suffering, husband is in agreement for discussion with medical team to initiate comfort measures at that time.  Awaiting arrival of family at 6 PM tonight to then shift to full comfort measures.  Discussed with CCM that PMT is off service after 5 PM but we remain available to patient and family throughout her hospitalization.  Primary Decision Maker NEXT OF KIN  Physical Exam Vitals reviewed.  Constitutional:      General: She is not in acute distress.    Appearance: She is ill-appearing.  HENT:     Head: Normocephalic.     Mouth/Throat:     Mouth:  Mucous membranes are dry.     Comments: Hyperextension of neck, drooping of nasolabial folds Pulmonary:     Effort: Pulmonary effort is normal.  Abdominal:     Palpations: Abdomen is soft.  Skin:    General: Skin is warm and dry.     Coloration: Skin is pale.  Neurological:     Comments: Nonverbal - encephalopathic     Palliative Assessment/Data: 20%     Thank you for this consult. Palliative medicine will continue to follow and assist holistically.   Time Total: 75 minutes  Time spent includes: Detailed review of medical records (labs, imaging, vital signs), medically appropriate exam (mental status, respiratory, cardiac, skin), discussed with treatment team, counseling and educating patient, family and staff, documenting clinical information, medication management and coordination of care.  Signed by: Eller Gut, DNP, FNP-BC Palliative Medicine   Please contact Palliative Medicine Team providers via [facility] for questions and concerns.

## 2023-06-20 NOTE — Progress Notes (Signed)
 Pharmacy Antibiotic Note  Melissa [name] is a 89 y.o. female admitted on 06/06/2023 with sepsis.  Pharmacy has been consulted for Zosyn  dosing.  Plan: Zosyn  3.375 gm IV Q12H EI to start on 06-24-2023 @ 0230.   Height: 5\' 5"  (165.1 cm) Weight: 50 kg (110 lb 3.7 oz) IBW/kg (Calculated) : 57  Temp (24hrs), Avg:97.6 F (36.4 C), Min:96.5 F (35.8 C), Max:98.5 F (36.9 C)  Recent Labs  Lab 06/06/23 1602 06/06/23 2008 06/06/23 2224 06/07/23 0417 06/11/23 0418 06/11/23 2331  WBC 19.3*  --   --  12.7* 16.7* 25.5*  CREATININE 0.69  --   --  0.68 0.78 1.66*  LATICACIDVEN  --  1.1 1.0  --   --  >9.0*    Estimated Creatinine Clearance: 17.1 mL/min (A) (by C-G formula based on SCr of 1.66 mg/dL (H)).    Allergies  Allergen Reactions   Sulfonamide Derivatives Hives   Sulfa Antibiotics Other (See Comments) and Rash    Antimicrobials this admission:   >>    >>   Dose adjustments this admission:   Microbiology results:  BCx:   UCx:    Sputum:    MRSA PCR:   Thank you for allowing pharmacy to be a part of this patient's care.  Zyron Deeley D 06/24/23 2:36 AM

## 2023-06-20 NOTE — Plan of Care (Signed)

## 2023-06-20 NOTE — Progress Notes (Signed)
 Family at bedside and have talked with Dr. Aleskerov to make patient comfort care.  Hydromorphone  given for pain and shortness of breath.  Vasopressors turned off.

## 2023-06-20 NOTE — Progress Notes (Signed)
 Noted pt. Heart rate starting to slow down with slow shallow respiration ,pt. Comfortably lying in bed  family son Polly Brink made aware of the change and expressed that they may not be able to make it back and just to give them a call if she pass away. Staff stayed with pt. with provider Recardo Canal NP  also @ bedside. Pt. Needs attended.

## 2023-06-20 NOTE — Inpatient Diabetes Management (Signed)
 Inpatient Diabetes Program Recommendations  AACE/ADA: New Consensus Statement on Inpatient Glycemic Control   Target Ranges:  Prepandial:   less than 140 mg/dL      Peak postprandial:   less than 180 mg/dL (1-2 hours)      Critically ill patients:  140 - 180 mg/dL    Latest Reference Range & Units 06/06/23 16:02 06/07/23 04:17 06/11/23 04:18 06/11/23 23:31 June 16, 2023 04:57  Glucose 70 - 99 mg/dL 621 (H) 308 (H) 657 (H) 177 (H) 259 (H)    Latest Reference Range & Units 06/22/22 13:47  Hemoglobin A1C 4.8 - 5.6 % 5.2   Review of Glycemic Control  Diabetes history: NO Outpatient Diabetes medications: NA Current orders for Inpatient glycemic control: None; Solucortef 50 mg Q6H  Inpatient Diabetes Program Recommendations:    Insulin : Lab glucose 259 mg/dl this morning.  If steroids are continued as ordered, please consider ordering CBGs Q4H and Novolog  0-9 units Q4H.  Thanks, Beacher Limerick, RN, MSN, CDCES Diabetes Coordinator Inpatient Diabetes Program [phone] (Team Pager from 8am to 5pm)

## 2023-06-20 NOTE — IPAL (Signed)
  Interdisciplinary Goals of Care Family Meeting   Date carried out: 2023-07-01  Location of the meeting: Bedside  Member's involved: Nurse Practitioner, Bedside Registered Nurse, and Family Member or next of kin  Durable Power of Attorney or acting medical decision maker: Pt's husband Melissa [name]    Discussion: We discussed goals of care for Melissa [name] .  We reviewed events overnight including Hemorrhagic shock from Acute GI Bleed, now with development of multiorgan failure including: Encephalopathy, AKI, shock liver, and persistent shock requiring 2 vasopressors.  He confirms details from previous IPAL conversations from overnight team that he DOES NOT wish to purse further GI workup even thought she continues to have dark bloody stools.  He realizes she is declining and could pass away at any time. Confirms DNR/DNI status.  He reports that the patient's son and daughter are on their way from [place]  to come to bedside, with expected arrival at around 18:00 this evening.  He requests that we continue current measures (blood transfusions, vasopressors) in attempt to keep her here until the children are able to arrive later this evening.  If she were to decline further rapidly, he would transition to comfort measures as he does not want her to suffer.  Will continue current measures with tentative plan to transition to Comfort Measures after family has arrived and able to visit.  Should she decline further and show signs of suffering, he would transition to comfort at that time prior to their arrival.   Code status:   Code Status: Limited: Do not attempt resuscitation (DNR) -DNR-LIMITED -Do Not Intubate/DNI    Disposition: Continue current acute care  Time spent for the meeting: 15 minutes    Cherylann Corpus, AGACNP-BC [facility] Prefer epic messenger for cross cover needs If after hours, please call E-link  Delanna Fears, NP  2023-07-01, 11:00 AM

## 2023-06-20 NOTE — Progress Notes (Signed)
 PHARMACY CONSULT NOTE - FOLLOW UP  Pharmacy Consult for Electrolyte Monitoring and Replacement   Recent Labs: Potassium  Date Value  07/05/2023 4.1 mmol/L  09/03/2008 3.9 mEq/L   Magnesium  (mg/dL)  Date Value  09/81/1914 2.0   Calcium  (mg/dL)  Date Value  78/29/5621 7.4 (L)  09/03/2008 8.8   Albumin  (g/dL)  Date Value  30/86/5784 2.3 (L)  09/03/2008 3.2 (L)   Phosphorus (mg/dL)  Date Value  69/62/9528 3.9   Sodium  Date Value  Jul 05, 2023 150 mmol/L (H)  03/25/2023 141  09/03/2008 143 mEq/L     Assessment: 89 yo F presenting to [facility] ED on 06/06/23 for evaluation after a fall of a hip fracture s/p hemiarthroplasty 4/18 now w/ suspected lower diverticular bleed and hypotension transferred to CCU. Pharmacy is asked to follow and replace electrolytes while in CCU.  Goal of Therapy:  Electrolytes WNL  Plan:  --MIVF changed to 0.45% NaCl at 50 mL/hr ISO hypernatremia ---recheck electrolytes in am  Adalberto Acton ,PharmD Clinical Pharmacist 07-05-2023 7:09 AM

## 2023-06-20 NOTE — Progress Notes (Signed)
 Received pt. on comfort care with family @ bedside just received a dose of dilaudid  , will start the drip to keep pt. more comfortable, needs attended and let family  stayed  with patient.Aaron Aas

## 2023-06-20 NOTE — Progress Notes (Signed)
   Jul 09, 2023 1900  Spiritual Encounters  Type of Visit Initial  Care provided to: Pt and family  Conversation partners present during encounter Nurse  Referral source Nurse (RN/NT/LPN)  Reason for visit End-of-life  OnCall Visit Yes  Spiritual Framework  Presenting Themes Meaning/purpose/sources of inspiration;Goals in life/care;Values and beliefs;Other (comment) (for family)  Interventions  Spiritual Care Interventions Made Established relationship of care and support;Compassionate presence;Other (comment) (for family)  Intervention Outcomes  Outcomes Connection to spiritual care;Awareness around self/spiritual resourses;Awareness of support;Other (comment) (for family)

## 2023-06-20 NOTE — Plan of Care (Signed)
  Problem: Education: Goal: Knowledge of General Education information will improve Description: Including pain rating scale, medication(s)/side effects and non-pharmacologic comfort measures Outcome: Progressing   Problem: Health Behavior/Discharge Planning: Goal: Ability to manage health-related needs will improve Outcome: Progressing   Problem: Clinical Measurements: Goal: Will remain free from infection Outcome: Progressing Goal: Diagnostic test results will improve Outcome: Progressing Goal: Respiratory complications will improve Outcome: Progressing Goal: Cardiovascular complication will be avoided Outcome: Progressing   Problem: Activity: Goal: Risk for activity intolerance will decrease Outcome: Progressing   Problem: Coping: Goal: Level of anxiety will decrease Outcome: Progressing   Problem: Pain Managment: Goal: General experience of comfort will improve and/or be controlled Outcome: Progressing   Problem: Safety: Goal: Ability to remain free from injury will improve Outcome: Progressing   Problem: Skin Integrity: Goal: Risk for impaired skin integrity will decrease Outcome: Progressing

## 2023-06-20 NOTE — Progress Notes (Signed)
 NAME:  Melissa [name], MRN:  [id number], DOB:  [birth year], LOS: 6 ADMISSION DATE:  06/06/2023, CONSULTATION DATE:  06/11/23 REFERRING MD:  Enolia Hartmann, NP, CHIEF COMPLAINT: Fall    History of Present Illness:  89 yo F presenting to [facility] ED on 06/06/23 for evaluation after a fall.  History provided per chart review as patient is unable to provide history and family is not present. Patient was in a mechanical wheelchair when she had a witnessed fall.   ED course: On arrival, patient unable to give any history. Vitals stable with marginal BP on RA. Labs significant for leukocytosis. Imaging revealed a closed left femoral fracture. Imaging revealed Orthopedic surgery will take the patient to the OR 4/18 and TRH consulted for admission  Most Recent Significant labs: (Labs/ Imaging personally reviewed) I, Recardo Canal Rust-Chester, AGACNP-BC, personally viewed and interpreted this ECG. EKG Interpretation: Date: 06/06/23, EKG Time: 16:41, Rate: 87, Rhythm: NSR, QRS Axis: normal, Intervals: normal, ST/T Wave abnormalities: none, Narrative Interpretation: NSR Chemistry: Na+: 148, K+: 5.6, BUN/Cr.: 72/ 1.66, Serum CO2/ AG: 12/17 Hematology: WBC: 25.5, Hgb: 7.0,  Lactic: >9.0  CXR 06/06/23: low lung volumes with bibasilar atelectasis. CT head wo contrast 06/06/23: no acute intracranial abnormality CT cervical spine wo contrast 06/06/23:  Mild motion artifact. No acute traumatic injury identified in the cervical spine. Chronic osteopenia, cervical spine degeneration, chronic T2 compression fracture.    Pertinent  Medical History  Dementia Iron deficiency anemia Depression GERD HFpEF CVA  Significant Hospital Events: Including procedures, antibiotic start and stop dates in addition to other pertinent events   06/06/23: Admit to inpatient with hip fracture after mechanical fall from wheelchair. 06/07/23: Hemiarthroplasty performed 06/11/23: Suspected Lower Diverticular bleed. Husband reported extensive GI  workup previously and would not want further scope. Hgb dropped to 4.3. Patient received 2u pRBC's. Overnight IV access lost, hypotensive, transfer to ICU for central line placement and vasopressors. PCCM consulted. 06-30-23- patient actively having large sized bloody BM this morning. She is DNR on pressor support. S/p 4units pRBC and 1 unit of FFP.     Objective   Blood pressure 128/66, pulse 100, temperature 98.8 F (37.1 C), temperature source Axillary, resp. rate (!) 32, height 5\' 5"  (1.651 m), weight 50 kg, SpO2 93%.        Intake/Output Summary (Last 24 hours) at 06-30-23 1001 Last data filed at 06-30-23 4098 Gross per 24 hour  Intake 1949.23 ml  Output 205 ml  Net 1744.23 ml   Filed Weights   06/06/23 1107  Weight: 50 kg    Examination: General: Adult female, critically ill, lying in bed, moaning HEENT: MM pink/moist, anicteric, atraumatic, neck supple Neuro: lethargic, unable to follow commands, PERRL +3, CV: s1s2 RRR, ST on monitor, no r/m/g Pulm: Regular, non labored on  2 L [place], breath sounds diminished throughout GI: soft, rounded, non tender, bs x 4, small- moderate maroon stool noted Skin: ecchymosis scattered noted Extremities: warm/dry, pulses + 2 R/P, +1 generalized edema noted  Resolved Hospital Problem list     Assessment & Plan:  Acute Lower Gastrointestinal Bleed suspect secondary to diverticular Hemorrhagic Shock Acute on Chronic anemia - initiate Protonix  IV BID - Maintain up to date type & screen, Transfuse 3rd unit - continuous cardiac monitoring - NPO - Monitor for s/s of bleeding - Daily CBC, PT/ INR monitoring PRN, serial H&H - Transfuse for Hgb <7 - start levophed  drip, wean as tolerated to maintain MAP > 65 - CT angio GIB once  stabilized  Acute Kidney Injury suspect pre-renal secondary to hemorrhagic shock Hyperkalemia AGMA Baseline Cr: 0.6-0.7, Cr overnight:1.66 - Strict I/O's: alert provider if UOP < 0.5 mL/kg/hr - gentle IVF  hydration  - Daily BMP, replace electrolytes PRN - Avoid nephrotoxic agents as able, ensure adequate renal perfusion - sodium bicarb, insulin  and D50 ordered - f/u VBG and AM labs  Suspected Sepsis vs Reactive Lactic and leukocytosis due to GIB - Supplemental oxygen as needed, to maintain SpO2 > 90% - hold off on additional cultures at this time, trend lactic/ PCT - Daily CBC, monitor WBC/ fever curve - will start zosyn , consider de-escalating if PCT is negative - IVF hydration as needed  Vascular Dementia with behavior disturbance Dysphagia Depression Moderate Malnutrition Overall poor prognosis, family aware of current status. - NPO currently due to GIB, consider restarting mirtazapine  and dietary supplements once able to tolerate PO meds - SLP eval once stabilized  Best Practice (right click and "Reselect all SmartList Selections" daily)  Diet/type: NPO DVT prophylaxis SCD Pressure ulcer(s): N/A GI prophylaxis: PPI Lines: Central line and yes and it is still needed Foley:  N/A Code Status:  DNR Last date of multidisciplinary goals of care discussion [06/11/23]  Labs   CBC: Recent Labs  Lab 06/06/23 1602 06/07/23 0417 06/11/23 0418 06/11/23 0841 06/11/23 1353 06/11/23 2331 07-Jul-2023 0457  WBC 19.3* 12.7* 16.7*  --   --  25.5* 40.4*  NEUTROABS 17.3*  --   --   --   --   --   --   HGB 8.5* 8.5* 4.3* 4.9* 8.1* 7.0* 8.3*  HCT 25.8* 24.3* 12.7* 14.8*  --  20.6* 23.9*  MCV 99.6 96.0 95.5  --   --  90.4 88.5  PLT 315 322 316  --   --  298 243    Basic Metabolic Panel: Recent Labs  Lab 06/06/23 1602 06/07/23 0417 06/11/23 0418 06/11/23 2331 Jul 07, 2023 0457  NA 138 139 143 148* 150*  K 3.8 3.8 4.2 5.6* 4.1  CL 107 106 112* 119* 116*  CO2 23 25 23  12* 22  GLUCOSE 109* 113* 175* 177* 259*  BUN 21 22 53* 72* 81*  CREATININE 0.69 0.68 0.78 1.66* 1.54*  CALCIUM  8.1* 8.5* 8.1* 7.6* 7.4*  MG  --   --  2.2  --  2.0  PHOS  --   --   --   --  3.9   GFR: Estimated  Creatinine Clearance: 18.4 mL/min (A) (by C-G formula based on SCr of 1.54 mg/dL (H)). Recent Labs  Lab 06/06/23 2008 06/06/23 2224 06/07/23 0417 06/11/23 0418 06/11/23 2331 07-07-23 0457  PROCALCITON  --   --   --   --   --  2.50  WBC  --   --  12.7* 16.7* 25.5* 40.4*  LATICACIDVEN 1.1 1.0  --   --  >9.0* 5.3*    Liver Function Tests: Recent Labs  Lab 06/06/23 1602 Jul 07, 2023 0457  AST 28 863*  ALT 23 551*  ALKPHOS 69 67  BILITOT 1.0 1.0  PROT 5.9* 4.5*  ALBUMIN  2.8* 2.3*   No results for input(s): "LIPASE", "AMYLASE" in the last 168 hours. No results for input(s): "AMMONIA" in the last 168 hours.  ABG    Component Value Date/Time   HCO3 23.2 07-07-2023 0500   ACIDBASEDEF 2.2 (H) 07-07-23 0500   O2SAT 72.8 2023/07/07 0500     Coagulation Profile: No results for input(s): "INR", "PROTIME" in the last 168 hours.  Cardiac  Enzymes: No results for input(s): "CKTOTAL", "CKMB", "CKMBINDEX", "TROPONINI" in the last 168 hours.  HbA1C: Hgb A1c MFr Bld  Date/Time Value Ref Range Status  06/22/2022 01:47 PM 5.2 4.8 - 5.6 % Final    Comment:    (NOTE) Pre diabetes:          5.7%-6.4%  Diabetes:              >6.4%  Glycemic control for   <7.0% adults with diabetes     CBG: Recent Labs  Lab 06/11/23 2253 2023/07/06 0623 07-06-2023 0757  GLUCAP 163* 242* 220*    Review of Systems:   UTA  Past Medical History:  She,  has a past medical history of Arthritis, Chest pain, GERD (gastroesophageal reflux disease), Heart murmur, History of blood transfusion, History of migraine headaches, Iron deficiency anemia, Joint pain, PONV (postoperative nausea and vomiting), and Rectocele (2011).   Surgical History:   Past Surgical History:  Procedure Laterality Date   cataract surgery  Bilateral    R 03/2001; L 05/2001.   COLONOSCOPY     HIP ARTHROPLASTY Left 06/07/2023   Procedure: HEMIARTHROPLASTY (BIPOLAR) HIP, POSTERIOR APPROACH FOR FRACTURE;  Surgeon: Lorri Rota, MD;   Location: [facility] ORS;  Service: Orthopedics;  Laterality: Left;   HYSTEROSCOPY  11/1999   w/ resection of endometrial polyp by uterine curetting.   INTRAMEDULLARY (IM) NAIL INTERTROCHANTERIC Right 07/15/2020   Procedure: INTRAMEDULLARY (IM) NAIL INTERTROCHANTRIC;  Surgeon: Rande Bushy, MD;  Location: [facility] ORS;  Service: Orthopedics;  Laterality: Right;   JOINT REPLACEMENT Bilateral R-9/04, L- 8/05   Total Knee Arthroplasties.   KYPHOPLASTY N/A 04/10/2019   Procedure: KYPHOPLASTY;  Surgeon: Molli Angelucci, MD;  Location: [facility] ORS;  Service: Orthopedics;  Laterality: N/A;   REVERSE SHOULDER ARTHROPLASTY Right 11/26/2013   Procedure: RIGHT REVERSE SHOULDER ARTHROPLASTY;  Surgeon: Glo Larch, MD;  Location: [facility] OR;  Service: Orthopedics;  Laterality: Right;   SHOULDER ARTHROSCOPY Right    THYROIDECTOMY  at age 3   TONSILLECTOMY       Social History:   reports that she has never smoked. She has never used smokeless tobacco. She reports that she does not drink alcohol  and does not use drugs.   Family History:  Her family history includes Heart attack in her brother, brother, and mother; Other in her father.   Allergies Allergies  Allergen Reactions   Sulfonamide Derivatives Hives   Sulfa Antibiotics Other (See Comments) and Rash     Home Medications  Prior to Admission medications   Medication Sig Start Date End Date Taking? Authorizing Provider  acetaminophen  (TYLENOL ) 500 MG tablet Take 1,000 mg by mouth 3 (three) times daily.   Yes [provider]  aspirin  EC 81 MG tablet Take 81 mg by mouth daily. Swallow whole.   Yes [provider]  atorvastatin  (LIPITOR) 40 MG tablet Take 1 tablet (40 mg total) by mouth daily. 06/26/22  Yes Luna Salinas, MD  bismuth  subsalicylate (PEPTO BISMOL) 262 MG/15ML suspension Take 30 mLs by mouth every 4 (four) hours as needed.   Yes [provider]  calcium  carbonate (OSCAL) 1500 (600 Ca) MG TABS tablet Take 600 mg of  elemental calcium  by mouth daily.   Yes [provider]  cholecalciferol  (VITAMIN D ) 1000 units tablet Take 1,000 Units by mouth daily.   Yes [provider]  mirtazapine  (REMERON ) 7.5 MG tablet Take 7.5 mg by mouth at bedtime.   Yes [provider]  polyethylene glycol (MIRALAX  /  GLYCOLAX ) 17 g packet Take 17 g by mouth daily.   Yes [provider]  saccharomyces boulardii (FLORASTOR) 250 MG capsule Take 250 mg by mouth daily.   Yes [provider]  enoxaparin  (LOVENOX ) 40 MG/0.4ML injection Inject 0.4 mLs (40 mg total) into the skin daily for 28 days. 06/10/23 07/08/23  Bert Britain, PA-C  mirabegron  ER (MYRBETRIQ ) 25 MG TB24 tablet Take 25 mg by mouth daily. Patient not taking: Reported on 05/21/2023    [provider]  oxyCODONE  (OXY IR/ROXICODONE ) 5 MG immediate release tablet Take 0.5-1 tablets (2.5-5 mg total) by mouth every 4 (four) hours as needed for moderate pain (pain score 4-6) (pain score 4-6). 06/10/23   Bert Britain, PA-C  traMADol  (ULTRAM ) 50 MG tablet Take 1 tablet (50 mg total) by mouth every 6 (six) hours as needed for moderate pain (pain score 4-6). 06/10/23   Bert Britain, PA-C  UNABLE TO FIND Take 4 oz by mouth 2 (two) times daily. Med Name: Medpass    [provider]     Critical care provider statement:   Total critical care time: 33 minutes   Performed by: Jaclynn Mast MD   Critical care time was exclusive of separately billable procedures and treating other patients.   Critical care was necessary to treat or prevent imminent or life-threatening deterioration.   Critical care was time spent personally by me on the following activities: development of treatment plan with patient and/or surrogate as well as nursing, discussions with consultants, evaluation of patient's response to treatment, examination of patient, obtaining history from patient or surrogate, ordering and performing treatments and interventions,  ordering and review of laboratory studies, ordering and review of radiographic studies, pulse oximetry and re-evaluation of patient's condition.    Dyanna Seiter, M.D.  Pulmonary & Critical Care Medicine

## 2023-06-20 NOTE — Progress Notes (Signed)
 eLink Physician-Brief Progress Note Patient Name: BRAYDEE SHIMKUS DOB: November 14, 1928 MRN: [id number]   Date of Service  2023/07/02  HPI/Events of Note  Patient admitted to the ICU with left hip fracture s/p ORIF and GI bleeding with acute blood loss anemia. Patient has been hypotensive.  eICU Interventions  New Patient Evaluation.        Trevino Wyatt U Harold Mattes 2023-07-02, 12:15 AM

## 2023-06-20 NOTE — Progress Notes (Signed)
 Nutrition Brief Note  Chart reviewed. Pt now transitioning to comfort care tonight when family arrives. No further nutrition interventions planned at this time.  Please re-consult as needed.   Herschel Lords, RD, LDN, CDCES Registered Dietitian III Certified Diabetes Care and Education Specialist If unable to reach this RD, please use "RD Inpatient" group chat on secure chat between hours of 8am-4 pm daily

## 2023-06-20 NOTE — Progress Notes (Signed)
 Updated son, Polly Brink, over the phone. We discussed current clinical status, she's continued to have bloody bowel movements and required 2 additional units of blood. We talked about involving palliative care team to assist her spouse in looking at all available options in her care.   Recardo Canal Rust-Chester, AGACNP-BC Acute Care Nurse Practitioner [facility]   [phone] / [phone] Please see Amion for details.

## 2023-06-20 NOTE — Death Summary Note (Signed)
 DEATH SUMMARY   Patient Details  Name: Melissa [name] MRN: [id number] DOB: 11-Jan-1929  Admission/Discharge Information   Admit Date:  07-06-2023  Date of Death:  07/12/23  Time of Death:  22:10  Length of Stay: 6  Referring Physician: Valrie Gehrig, MD   Reason(s) for Hospitalization  Fall with hip fracture  Diagnoses  Preliminary cause of death: Hemorrhagic Shock s/t GIB Secondary Diagnoses (including complications and co-morbidities):  Principal Problem:   Closed left hip fracture, initial encounter (HCC) Active Problems:   GERD   Constipation   OP (osteoporosis)   Depression   Protein-calorie malnutrition, severe (HCC)   Vascular dementia with behavior disturbance (HCC)   Mixed hyperlipidemia   Closed left femoral fracture (HCC)   Malnutrition of moderate degree   Shock (HCC)   Hemorrhagic shock (HCC)   AKI (acute kidney injury) (HCC)   Hyperkalemia   GIB (gastrointestinal bleeding)   Acute on chronic anemia   Brief Hospital Course (including significant findings, care, treatment, and services provided and events leading to death)  Melissa [name] is a 89 y.o. year old female  presenting to [facility] ED on Jul 06, 2023 for evaluation after a fall.   History provided per chart review as patient is unable to provide history and family is not present. Patient was in a mechanical wheelchair when she had a witnessed fall.    ED course: On arrival, patient unable to give any history. Vitals stable with marginal BP on RA. Labs significant for leukocytosis. Imaging revealed a closed left femoral fracture. Imaging revealed Orthopedic surgery will take the patient to the OR 4/18 and [facility] consulted for admission   Most Recent Significant labs: (Labs/ Imaging personally reviewed) I, Recardo Canal Rust-Chester, AGACNP-BC, personally viewed and interpreted this ECG. EKG Interpretation: Date: 2023-07-06, EKG Time: 16:41, Rate: 87, Rhythm: NSR, QRS Axis: normal, Intervals: normal, ST/T Wave  abnormalities: none, Narrative Interpretation: NSR Chemistry: Na+: 148, K+: 5.6, BUN/Cr.: 72/ 1.66, Serum CO2/ AG: 12/17 Hematology: WBC: 25.5, Hgb: 7.0,  Lactic: >9.0   CXR 07/06/2023: low lung volumes with bibasilar atelectasis. CT head wo contrast Jul 06, 2023: no acute intracranial abnormality CT cervical spine wo contrast 07-06-2023:  Mild motion artifact. No acute traumatic injury identified in the cervical spine. Chronic osteopenia, cervical spine degeneration, chronic T2 compression fracture.   PCCM consulted for assistance in management and monitoring due to hemorrhagic shock secondary to suspected GIB requiring urgent central line placement and vasopressor support. Significant Hospital Events: Including procedures, antibiotic start and stop dates in addition to other pertinent events   07-06-23: Admit to inpatient with hip fracture after mechanical fall from wheelchair. 06/07/23: Hemiarthroplasty performed 06/11/23: Suspected Lower Diverticular bleed. Husband reported extensive GI workup previously and would not want further scope. Hgb dropped to 4.3. Patient received 2u pRBC's. Overnight IV access lost, hypotensive, transfer to ICU for central line placement and vasopressors. PCCM consulted. 07-12-2023: Multiorgan failure, continued bleeding requiring blood transfusions. Palliative consulted, decision made by family to transition to comfort measures only.  Patient passed away with staff bedside, family alerted to passing.  Pertinent Labs and Studies  Significant Diagnostic Studies DG Chest Port 1 View Result Date: 12-Jul-2023 CLINICAL DATA:  Hypoxia. EXAM: PORTABLE CHEST 1 VIEW COMPARISON:  2023-07-06. FINDINGS: Stable cardiomediastinal silhouette. Status post right shoulder arthroplasty. Right lung is clear. Minimal left basilar atelectasis is noted with small left pleural effusion. IMPRESSION: Minimal left basilar atelectasis with small left pleural effusion. Electronically Signed   By: Rosalene Colon  M.D.   On: 06/27/23 08:56   CT ANGIO GI BLEED Result Date: 2023-06-27 CLINICAL DATA:  GI bleed EXAM: CTA ABDOMEN AND PELVIS WITHOUT AND WITH CONTRAST TECHNIQUE: Multidetector CT imaging of the abdomen and pelvis was performed using the standard protocol during bolus administration of intravenous contrast. Multiplanar reconstructed images and MIPs were obtained and reviewed to evaluate the vascular anatomy. RADIATION DOSE REDUCTION: This exam was performed according to the departmental dose-optimization program which includes automated exposure control, adjustment of the mA and/or kV according to patient size and/or use of iterative reconstruction technique. CONTRAST:  OMNIPAQUE  IOHEXOL  350 MG/ML SOLN COMPARISON:  None Available. FINDINGS: VASCULAR Aorta: Calcific aortic atherosclerosis.  No dissection or aneurysm. Celiac: Patent without evidence of aneurysm, dissection, vasculitis or significant stenosis. SMA: Patent without evidence of aneurysm, dissection, vasculitis or significant stenosis. Renals: Moderate narrowing of proximal right renal artery due to atherosclerotic calcification. Unremarkable left renal artery. IMA: Patent Inflow: Low-density plaque within the proximal right external iliac artery, which remains patent. The right internal and left internal and external iliac arteries are patent. There is multifocal atherosclerotic calcification. Proximal Outflow: Bilateral common femoral and visualized portions of the superficial and profunda femoral arteries are patent without evidence of aneurysm, dissection, vasculitis or significant stenosis. Veins: No obvious venous abnormality within the limitations of this arterial phase study. Review of the MIP images confirms the above findings. NON-VASCULAR Lower chest: No acute abnormality. Hepatobiliary: Multifocal calcification. Normal gallbladder. No focal hepatic lesion. Pancreas: Unremarkable. No pancreatic ductal dilatation or surrounding  inflammatory changes. Spleen: Multiple calcifications Adrenals/Urinary Tract: Adrenal glands are unremarkable. Kidneys are normal, without renal calculi, focal lesion, or hydronephrosis. Bladder is unremarkable. Stomach/Bowel: No acute blood products visualized within the colon or small bowel. No dilated small bowel or active inflammation. Lymphatic: Negative Reproductive: Uterus and bilateral adnexa are unremarkable. Other: No abdominal wall hernia or abnormality. No abdominopelvic ascites. Musculoskeletal: Grade 1 anterolisthesis at L4-5 chronic L1 compression fracture, status post augmentation. Chronic T11 compression deformity. IMPRESSION: 1. No acute blood products visualized within the colon or small bowel. 2. Moderate narrowing of the proximal right renal artery due to atherosclerotic calcification. 3. Low-density plaque within the proximal right external iliac artery, which remains patent. Aortic Atherosclerosis (ICD10-I70.0). Electronically Signed   By: Juanetta Nordmann M.D.   On: 06-27-23 03:06   US  EKG SITE RITE Result Date: 06/11/2023 If [facility] image not attached, placement could not be confirmed due to current cardiac rhythm.  DG Pelvis 1-2 Views Result Date: 06/07/2023 CLINICAL DATA:  Status post left hip hemiarthroplasty. EXAM: PELVIS - 1-2 VIEW COMPARISON:  bilateral hip radiographs 06/06/2023 FINDINGS: There is diffuse decreased bone mineralization. Interval left hip bipolar hemiarthroplasty. Partial visualization of right proximal femoral cephalomedullary nail fixation. No peripheral lucency is seen to indicate heart failure loosening. Expected postoperative changes of lateral left hip subcutaneous fat and left hip intra-articular air. Redemonstration of cement augmentation of the sacrum. IMPRESSION: Interval left hip bipolar hemiarthroplasty with expected postoperative changes. Electronically Signed   By: Bertina Broccoli M.D.   On: 06/07/2023 16:55   DG Pelvis Portable Result Date:  06/07/2023 CLINICAL DATA:  Intraoperative film for left hip hemiarthroplasty. EXAM: PORTABLE PELVIS 1-2 VIEWS COMPARISON:  Pelvis and bilateral hip radiographs 06/06/2023 FINDINGS: There is diffuse decreased bone mineralization. The patient is undergoing left hip arthroplasty with placement of the left femoral stem. The left femoral head and neck have been removed. Partial visualization of right cephalomedullary nail fixation of the proximal right femur. Redemonstration  of cement augmentation of the sacrum. IMPRESSION: Intraoperative film during left hip arthroplasty. Electronically Signed   By: Bertina Broccoli M.D.   On: 06/07/2023 16:54   DG Chest Portable 1 View Result Date: 06/06/2023 CLINICAL DATA:  Fall.  Left hip fracture.  Preop exam. EXAM: PORTABLE CHEST 1 VIEW COMPARISON:  06/22/2021 radiograph FINDINGS: Stable cardiomegaly. Aortic atherosclerotic calcification. Low lung volumes accentuate pulmonary vascularity. Bibasilar atelectasis. No pleural effusion or pneumothorax. No displaced rib fractures. Nodular opacity projecting over the right lower lung is favored osseous in nature possibly related to remote rib fracture. Right reverse TSA. L1 vertebroplasty. IMPRESSION: Low lung volumes with bibasilar atelectasis. Electronically Signed   By: Rozell Cornet M.D.   On: 06/06/2023 18:54   DG HIPS BILAT WITH PELVIS MIN 5 VIEWS Result Date: 06/06/2023 CLINICAL DATA:  Witnessed fall EXAM: DG HIP (WITH OR WITHOUT PELVIS) 5V BILAT COMPARISON:  Right hip and pelvis radiograph dated 06/22/2021 FINDINGS: Postsurgical changes from right proximal femoral fixation. Hardware appears intact and well seated. Minimally displaced transcervical fracture of the left proximal femur with varus angulation. Old right inferior pubic ramus fracture. Unchanged irregularity of the left inferior pubic ramus. Prior augmentation of the sacrum. IMPRESSION: Minimally displaced transcervical fracture of the left proximal femur.  Electronically Signed   By: Limin  Xu M.D.   On: 06/06/2023 15:53   CT Cervical Spine Wo Contrast Result Date: 06/06/2023 CLINICAL DATA:  89 year old female status post fall striking head. EXAM: CT CERVICAL SPINE WITHOUT CONTRAST TECHNIQUE: Multidetector CT imaging of the cervical spine was performed without intravenous contrast. Multiplanar CT image reconstructions were also generated. RADIATION DOSE REDUCTION: This exam was performed according to the departmental dose-optimization program which includes automated exposure control, adjustment of the mA and/or kV according to patient size and/or use of iterative reconstruction technique. COMPARISON:  Head CT today.  Cervical spine CT 06/28/2021. FINDINGS: Mild motion artifact today. Alignment: Stable cervical lordosis, cervicothoracic junction and posterior element alignment. Skull base and vertebrae: Chronic osteopenia. Visualized skull base is intact. No atlanto-occipital dissociation. C1 and C2 appear intact and aligned. No acute osseous abnormality identified. Soft tissues and spinal canal: No prevertebral fluid or swelling. No visible canal hematoma. Motion artifact, negative visible noncontrast neck soft tissues. Disc levels: Cervical spine degeneration superimposed on chronic degenerative ankylosis of the C3-C4 facets. Bulky chronic disc osteophyte complex at C5-C6. Upper chest: Chronic T2 compression fracture partially visible and appears stable. Developing upper thoracic posterior element ankylosis including that level. Partially calcified apical lung scarring appears stable. IMPRESSION: 1. Mild motion artifact. No acute traumatic injury identified in the cervical spine. 2. Chronic osteopenia, cervical spine degeneration, chronic T2 compression fracture. Electronically Signed   By: Marlise Simpers M.D.   On: 06/06/2023 11:56   CT Head Wo Contrast Result Date: 06/06/2023 CLINICAL DATA:  89 year old female status post fall striking head. EXAM: CT HEAD WITHOUT  CONTRAST TECHNIQUE: Contiguous axial images were obtained from the base of the skull through the vertex without intravenous contrast. RADIATION DOSE REDUCTION: This exam was performed according to the departmental dose-optimization program which includes automated exposure control, adjustment of the mA and/or kV according to patient size and/or use of iterative reconstruction technique. COMPARISON:  Head CT and brain MRI 06/22/2022. FINDINGS: Brain: Stable cerebral volume. No midline shift, acute ventriculomegaly, mass effect, evidence of mass lesion, intracranial hemorrhage or evidence of cortically based acute infarction. Stable gray-white differentiation including chronic heterogeneity in the deep gray nuclei, chronic right occipital lobe encephalomalacia,  small chronic cerebellar infarcts. Vascular: Calcified atherosclerosis at the skull base. No suspicious intracranial vascular hyperdensity. Skull: Stable osteopenia. Intact. No acute osseous abnormality identified. Sinuses/Orbits: Visualized paranasal sinuses and mastoids are stable and well aerated. Other: No acute orbit or scalp soft tissue injury identified. IMPRESSION: 1. No acute intracranial abnormality or acute traumatic injury identified. 2. Stable non contrast CT appearance of chronic ischemic disease. Electronically Signed   By: Marlise Simpers M.D.   On: 06/06/2023 11:54    Microbiology Recent Results (from the past 240 hours)  Culture, blood (Routine X 2) w Reflex to ID Panel     Status: None   Collection Time: 06/06/23  8:08 PM   Specimen: BLOOD  Result Value Ref Range Status   Specimen Description BLOOD BLOOD RIGHT ARM  Final   Special Requests   Final    BOTTLES DRAWN AEROBIC AND ANAEROBIC Blood Culture adequate volume   Culture   Final    NO GROWTH 5 DAYS Performed at [facility], [address] [place], [place] [address]    Report Status 06/11/2023 FINAL  Final  Culture, blood (Routine X 2) w Reflex to ID Panel     Status:  None   Collection Time: 06/06/23 10:24 PM   Specimen: BLOOD  Result Value Ref Range Status   Specimen Description BLOOD BLOOD LEFT HAND  Final   Special Requests   Final    BOTTLES DRAWN AEROBIC ONLY Blood Culture results may not be optimal due to an inadequate volume of blood received in culture bottles   Culture   Final    NO GROWTH 5 DAYS Performed at [facility], [address] [place], [place] [address]    Report Status 06/11/2023 FINAL  Final  MRSA Next Gen by PCR, Nasal     Status: None   Collection Time: 2023/07/12 12:52 AM   Specimen: Nasal Mucosa; Nasal Swab  Result Value Ref Range Status   MRSA by PCR Next Gen NOT DETECTED NOT DETECTED Final    Comment: (NOTE) The GeneXpert MRSA Assay (FDA approved for NASAL specimens only), is one component of a comprehensive MRSA colonization surveillance program. It is not intended to diagnose MRSA infection nor to guide or monitor treatment for MRSA infections. Test performance is not FDA approved in patients less than 53 years old. Performed at [facility], [address] [place], [place] [address]     Lab Basic Metabolic Panel: Recent Labs  Lab 06/07/23 [id number] 06/11/23 0418 06/11/23 2331 2023-07-12 0457 2023/07/12 1049  NA 139 143 148* 150* 150*  K 3.8 4.2 5.6* 4.1 4.2  CL 106 112* 119* 116* 117*  CO2 25 23 12* 22 24  GLUCOSE 113* 175* 177* 259* 267*  BUN 22 53* 72* 81* 81*  CREATININE 0.68 0.78 1.66* 1.54* 1.46*  CALCIUM  8.5* 8.1* 7.6* 7.4* 7.3*  MG  --  2.2  --  2.0  --   PHOS  --   --   --  3.9  --    Liver Function Tests: Recent Labs  Lab 06/06/23 1602 2023-07-12 0457  AST 28 863*  ALT 23 551*  ALKPHOS 69 67  BILITOT 1.0 1.0  PROT 5.9* 4.5*  ALBUMIN  2.8* 2.3*   No results for input(s): "LIPASE", "AMYLASE" in the last 168 hours. No results for input(s): "AMMONIA" in the last 168 hours. CBC: Recent Labs  Lab 06/06/23 1602 06/07/23 0417 06/11/23 0418 06/11/23 6962 06/11/23 1353  06/11/23 2331 07-12-2023 0457 2023/07/12  1049  WBC 19.3* 12.7* 16.7*  --   --  25.5* 40.4*  --   NEUTROABS 17.3*  --   --   --   --   --   --   --   HGB 8.5* 8.5* 4.3* 4.9* 8.1* 7.0* 8.3* 9.2*  HCT 25.8* 24.3* 12.7* 14.8*  --  20.6* 23.9* 25.4*  MCV 99.6 96.0 95.5  --   --  90.4 88.5  --   PLT 315 322 316  --   --  298 243  --    Cardiac Enzymes: No results for input(s): "CKTOTAL", "CKMB", "CKMBINDEX", "TROPONINI" in the last 168 hours. Sepsis Labs: Recent Labs  Lab 06/06/23 2224 06/07/23 0417 06/11/23 0418 06/11/23 2331 06/28/23 0457 Jun 28, 2023 1049  PROCALCITON  --   --   --   --  2.50  --   WBC  --  12.7* 16.7* 25.5* 40.4*  --   LATICACIDVEN 1.0  --   --  >9.0* 5.3* 2.8*    Procedures/Operations  06/07/23: Hemiarthroplasty performed 06/11/23: CVC placement   Recardo Canal Rust-Chester, AGACNP-BC Acute Care Nurse Practitioner [facility]   [phone] / [phone] Please see Amion for details.

## 2023-06-20 DEATH — deceased
